# Patient Record
Sex: Female | Born: 1950 | Race: White | Hispanic: No | Smoking: Current every day smoker
Health system: Southern US, Community
[De-identification: ages and names within clinical notes are randomized; demographics above are authoritative.]

## PROBLEM LIST (undated history)

## (undated) DIAGNOSIS — I10 Essential (primary) hypertension: Secondary | ICD-10-CM

## (undated) DIAGNOSIS — I639 Cerebral infarction, unspecified: Secondary | ICD-10-CM

## (undated) DIAGNOSIS — E669 Obesity, unspecified: Secondary | ICD-10-CM

## (undated) DIAGNOSIS — E785 Hyperlipidemia, unspecified: Secondary | ICD-10-CM

## (undated) HISTORY — DX: Obesity, unspecified: E66.9

## (undated) HISTORY — PX: TUBAL LIGATION: SHX77

## (undated) HISTORY — DX: Essential (primary) hypertension: I10

## (undated) HISTORY — DX: Hyperlipidemia, unspecified: E78.5

---

## 2004-04-13 ENCOUNTER — Encounter: Admission: RE | Admit: 2004-04-13 | Discharge: 2004-07-12 | Payer: Self-pay | Admitting: Family Medicine

## 2004-07-25 ENCOUNTER — Encounter: Admission: RE | Admit: 2004-07-25 | Discharge: 2004-10-23 | Payer: Self-pay | Admitting: Family Medicine

## 2004-10-24 ENCOUNTER — Encounter: Admission: RE | Admit: 2004-10-24 | Discharge: 2005-01-13 | Payer: Self-pay | Admitting: Family Medicine

## 2005-08-05 ENCOUNTER — Encounter: Admission: RE | Admit: 2005-08-05 | Discharge: 2005-08-05 | Payer: Self-pay | Admitting: Family Medicine

## 2009-01-01 ENCOUNTER — Emergency Department (HOSPITAL_COMMUNITY): Admission: EM | Admit: 2009-01-01 | Discharge: 2009-01-01 | Payer: Self-pay | Admitting: Family Medicine

## 2010-07-31 ENCOUNTER — Encounter: Payer: Medicaid Other | Attending: Family Medicine | Admitting: *Deleted

## 2010-07-31 ENCOUNTER — Encounter: Payer: Self-pay | Admitting: *Deleted

## 2010-07-31 DIAGNOSIS — E119 Type 2 diabetes mellitus without complications: Secondary | ICD-10-CM | POA: Insufficient documentation

## 2010-07-31 DIAGNOSIS — E785 Hyperlipidemia, unspecified: Secondary | ICD-10-CM | POA: Insufficient documentation

## 2010-07-31 NOTE — Progress Notes (Signed)
  Medical Nutrition Therapy:  Appt start time:  5:00pm end time:  6:00pm.  Assessment:  Primary concerns today: T2DM  Pt with 7 yr h/o T2DM here for diet therapy. Reports previous DM education at [employer] and was doing very well, but is a "stress eater" with a "huge amount of stress lately" with daughter.  Reports fatigue, Vit D & B12 deficiencies, and wt gain of ~35 lbs in last year (d/t undesirable food choices and sedentary lifestyle).  Voices understanding of DM, CHO-counting, and dietary limitations.  No recent lab work on file and has not seen an eye doctor within the last year. Reports some blurry vision, but is unsure if just age-related or high BGs. Also reports episode of "words coming out of my mouth that I had no idea why...like apple". Pt to report episode to MD and request new lab work.   MEDICATIONS: Xanax 1 mg (prn), Paxil 10 mg daily; Reports NOT taking Vit D and Vit B12 at this time.   DIETARY INTAKE:  Usual eating pattern includes 1-2 meals and 0-1 snacks per day.  24-hr recall:  B ( AM): Bacon, egg, & cheese biscuit or SKIPS  Snk ( AM): none  L ( PM): Fast food or SKIPS Snk ( PM): none D ( PM):  Snk ( PM): none  Usual physical activity: none  Estimated energy needs: 1100 calories 135 g carbohydrates 65-70 g protein 30 g fat 25-30 g fiber  Progress Towards Goal(s):  New   Nutritional Diagnosis:  NI-5.8.2 Excessive carbohydrate intake related to diabetes as evidenced by patient-reported food history and symptoms of elevated blood glucose.    Intervention/Goals:  1100 calories/day with 30g of carbs at meals and 15g at snacks - Add protein to all meals and snacks.  Make appointment with eye doctor as soon as possible.   Aim for 25-30 grams of fiber and 64 ounces of water daily.  Limit/Avoid sugar-sweetened beverages and concentrated sweets.  Increase exercise to 2-3 times a week for 20 minutes each.  Follow up with me in 6-8 weeks or sooner if  needed.  Monitoring/Evaluation:  Dietary intake, exercise, A1c, and body weight in 4 week(s).

## 2010-07-31 NOTE — Patient Instructions (Addendum)
Goals:  1100 calories/day with 30g of carbs at meals and 15g at snacks - Add protein to all meals and snacks.  Make appointment with eye doctor as soon as possible.   Aim for 25-30 grams of fiber and 64 ounces of water daily.  Limit/Avoid sugar-sweetened beverages and concentrated sweets.  Increase exercise to 2-3 times a week for 20 minutes each.  Follow up with me in 6-8 weeks or sooner if needed.

## 2011-05-11 ENCOUNTER — Emergency Department (HOSPITAL_COMMUNITY): Payer: Medicaid Other

## 2011-05-11 ENCOUNTER — Emergency Department (HOSPITAL_COMMUNITY): Payer: Medicaid Other | Admitting: Certified Registered Nurse Anesthetist

## 2011-05-11 ENCOUNTER — Encounter (HOSPITAL_COMMUNITY): Payer: Self-pay | Admitting: *Deleted

## 2011-05-11 ENCOUNTER — Inpatient Hospital Stay (HOSPITAL_COMMUNITY)
Admission: EM | Admit: 2011-05-11 | Discharge: 2011-05-12 | DRG: 494 | Disposition: A | Discharge status: Home Health | Payer: Medicaid Other | Attending: Orthopedic Surgery | Admitting: Orthopedic Surgery

## 2011-05-11 ENCOUNTER — Encounter (HOSPITAL_COMMUNITY): Payer: Self-pay | Admitting: Certified Registered Nurse Anesthetist

## 2011-05-11 ENCOUNTER — Encounter (HOSPITAL_COMMUNITY)
Admission: EM | Disposition: A | Discharge status: Home Health | Payer: Self-pay | Source: Home / Self Care | Attending: Orthopedic Surgery

## 2011-05-11 DIAGNOSIS — E119 Type 2 diabetes mellitus without complications: Secondary | ICD-10-CM | POA: Diagnosis present

## 2011-05-11 DIAGNOSIS — F17200 Nicotine dependence, unspecified, uncomplicated: Secondary | ICD-10-CM | POA: Diagnosis present

## 2011-05-11 DIAGNOSIS — W010XXA Fall on same level from slipping, tripping and stumbling without subsequent striking against object, initial encounter: Secondary | ICD-10-CM | POA: Diagnosis present

## 2011-05-11 DIAGNOSIS — Y92009 Unspecified place in unspecified non-institutional (private) residence as the place of occurrence of the external cause: Secondary | ICD-10-CM

## 2011-05-11 DIAGNOSIS — X500XXA Overexertion from strenuous movement or load, initial encounter: Secondary | ICD-10-CM | POA: Diagnosis present

## 2011-05-11 DIAGNOSIS — I10 Essential (primary) hypertension: Secondary | ICD-10-CM | POA: Diagnosis present

## 2011-05-11 DIAGNOSIS — S82851A Displaced trimalleolar fracture of right lower leg, initial encounter for closed fracture: Secondary | ICD-10-CM

## 2011-05-11 DIAGNOSIS — E785 Hyperlipidemia, unspecified: Secondary | ICD-10-CM | POA: Diagnosis present

## 2011-05-11 DIAGNOSIS — S82853A Displaced trimalleolar fracture of unspecified lower leg, initial encounter for closed fracture: Principal | ICD-10-CM | POA: Diagnosis present

## 2011-05-11 HISTORY — PX: ORIF ANKLE FRACTURE: SHX5408

## 2011-05-11 LAB — BASIC METABOLIC PANEL
Chloride: 102 mEq/L (ref 96–112)
Creatinine, Ser: 0.7 mg/dL (ref 0.50–1.10)
GFR calc Af Amer: >90 mL/min (ref >90)
Potassium: 4.3 mEq/L (ref 3.5–5.1)
Sodium: 137 mEq/L (ref 135–145)

## 2011-05-11 LAB — GLUCOSE, CAPILLARY: Glucose-Capillary: 175 mg/dL — ABNORMAL HIGH (ref 70–99)

## 2011-05-11 LAB — DIFFERENTIAL
Basophils Absolute: 0.1 10*3/uL (ref 0.0–0.1)
Basophils Relative: 0 % (ref 0–1)
Monocytes Absolute: 0.7 10*3/uL (ref 0.1–1.0)
Neutro Abs: 10.7 10*3/uL — ABNORMAL HIGH (ref 1.7–7.7)
Neutrophils Relative %: 74 % (ref 43–77)

## 2011-05-11 LAB — CBC
MCHC: 35.6 g/dL (ref 30.0–36.0)
Platelets: 309 10*3/uL (ref 150–400)
RDW: 13.3 % (ref 11.5–15.5)

## 2011-05-11 LAB — PROTIME-INR: Prothrombin Time: 13.1 seconds (ref 11.6–15.2)

## 2011-05-11 SURGERY — OPEN REDUCTION INTERNAL FIXATION (ORIF) ANKLE FRACTURE
Anesthesia: General | Site: Ankle | Laterality: Right | Wound class: Clean

## 2011-05-11 MED ORDER — 0.9 % SODIUM CHLORIDE (POUR BTL) OPTIME
TOPICAL | Status: DC | PRN
  Administered 2011-05-11: 1000 mL

## 2011-05-11 MED ORDER — OXYCODONE-ACETAMINOPHEN 5-325 MG PO TABS: 1.0000 | ORAL_TABLET | ORAL | Status: DC | PRN

## 2011-05-11 MED ORDER — HYDROMORPHONE HCL PF 1 MG/ML IJ SOLN: 1.0000 mg | Freq: Once | INTRAMUSCULAR | Status: DC

## 2011-05-11 MED ORDER — SODIUM CHLORIDE 0.9 % IV SOLN
INTRAVENOUS | Status: DC | PRN
  Administered 2011-05-11: 17:00:00 via INTRAVENOUS

## 2011-05-11 MED ORDER — SUCCINYLCHOLINE CHLORIDE 20 MG/ML IJ SOLN
INTRAMUSCULAR | Status: DC | PRN
  Administered 2011-05-11: 120 mg via INTRAVENOUS

## 2011-05-11 MED ORDER — ONDANSETRON HCL 4 MG/2ML IJ SOLN
INTRAMUSCULAR | Status: DC | PRN
  Administered 2011-05-11: 4 mg via INTRAVENOUS

## 2011-05-11 MED ORDER — METOCLOPRAMIDE HCL 10 MG PO TABS: 5.0000 mg | ORAL_TABLET | Freq: Three times a day (TID) | ORAL | Status: DC | PRN

## 2011-05-11 MED ORDER — HYDROCODONE-ACETAMINOPHEN 5-325 MG PO TABS
1.0000 | ORAL_TABLET | ORAL | Status: DC | PRN
  Administered 2011-05-12 (×3): 1 via ORAL
  Filled 2011-05-11 (×2): qty 1

## 2011-05-11 MED ORDER — PROPOFOL 10 MG/ML IV EMUL
INTRAVENOUS | Status: DC | PRN
  Administered 2011-05-11: 170 mg via INTRAVENOUS

## 2011-05-11 MED ORDER — SODIUM CHLORIDE 0.9 % IV SOLN
INTRAVENOUS | Status: DC
  Administered 2011-05-11: 20 mL/h via INTRAVENOUS

## 2011-05-11 MED ORDER — CEFAZOLIN SODIUM 1-5 GM-% IV SOLN
INTRAVENOUS | Status: DC | PRN
  Administered 2011-05-11: 2 g via INTRAVENOUS

## 2011-05-11 MED ORDER — METHOCARBAMOL 500 MG PO TABS
500.0000 mg | ORAL_TABLET | Freq: Four times a day (QID) | ORAL | Status: DC | PRN
  Administered 2011-05-12 (×2): 500 mg via ORAL
  Filled 2011-05-11 (×2): qty 1

## 2011-05-11 MED ORDER — EPHEDRINE SULFATE 50 MG/ML IJ SOLN
INTRAMUSCULAR | Status: DC | PRN
  Administered 2011-05-11: 10 mg via INTRAVENOUS
  Administered 2011-05-11: 5 mg via INTRAVENOUS

## 2011-05-11 MED ORDER — SODIUM CHLORIDE 0.9 % IV BOLUS (SEPSIS)
1000.0000 mL | Freq: Once | INTRAVENOUS | Status: AC
  Administered 2011-05-11: 1000 mL via INTRAVENOUS

## 2011-05-11 MED ORDER — WARFARIN SODIUM 7.5 MG PO TABS
7.5000 mg | ORAL_TABLET | Freq: Once | ORAL | Status: AC
  Administered 2011-05-11: 7.5 mg via ORAL
  Filled 2011-05-11: qty 1

## 2011-05-11 MED ORDER — CEFAZOLIN SODIUM-DEXTROSE 2-3 GM-% IV SOLR: 2.0000 g | INTRAVENOUS | Status: DC

## 2011-05-11 MED ORDER — ONDANSETRON HCL 4 MG/2ML IJ SOLN: 4.0000 mg | Freq: Four times a day (QID) | INTRAMUSCULAR | Status: DC | PRN

## 2011-05-11 MED ORDER — MIDAZOLAM HCL 5 MG/5ML IJ SOLN
INTRAMUSCULAR | Status: DC | PRN
  Administered 2011-05-11: 2 mg via INTRAVENOUS

## 2011-05-11 MED ORDER — ONDANSETRON HCL 4 MG PO TABS: 4.0000 mg | ORAL_TABLET | Freq: Four times a day (QID) | ORAL | Status: DC | PRN

## 2011-05-11 MED ORDER — ALPRAZOLAM 0.5 MG PO TABS
0.5000 mg | ORAL_TABLET | Freq: Two times a day (BID) | ORAL | Status: DC | PRN
  Administered 2011-05-11 – 2011-05-12 (×2): 0.5 mg via ORAL
  Filled 2011-05-11 (×2): qty 1

## 2011-05-11 MED ORDER — CEFAZOLIN SODIUM-DEXTROSE 2-3 GM-% IV SOLR
2.0000 g | Freq: Four times a day (QID) | INTRAVENOUS | Status: AC
  Administered 2011-05-12 (×3): 2 g via INTRAVENOUS
  Filled 2011-05-11 (×3): qty 50

## 2011-05-11 MED ORDER — PAROXETINE HCL 10 MG PO TABS
10.0000 mg | ORAL_TABLET | Freq: Every day | ORAL | Status: DC
Start: 2011-05-11 — End: 2011-05-12
  Administered 2011-05-11: 10 mg via ORAL
  Filled 2011-05-11 (×2): qty 1

## 2011-05-11 MED ORDER — LIDOCAINE HCL 4 % MT SOLN
OROMUCOSAL | Status: DC | PRN
  Administered 2011-05-11: 4 mL via TOPICAL

## 2011-05-11 MED ORDER — ONDANSETRON HCL 4 MG/2ML IJ SOLN: 4.0000 mg | Freq: Once | INTRAMUSCULAR | Status: DC | PRN

## 2011-05-11 MED ORDER — METHOCARBAMOL 100 MG/ML IJ SOLN
500.0000 mg | Freq: Four times a day (QID) | INTRAVENOUS | Status: DC | PRN
  Filled 2011-05-11: qty 5

## 2011-05-11 MED ORDER — WARFARIN - PHARMACIST DOSING INPATIENT: Freq: Every day | Status: DC

## 2011-05-11 MED ORDER — HYDROMORPHONE HCL PF 1 MG/ML IJ SOLN: 0.2500 mg | INTRAMUSCULAR | Status: DC | PRN

## 2011-05-11 MED ORDER — LACTATED RINGERS IV SOLN
INTRAVENOUS | Status: DC | PRN
  Administered 2011-05-11: 17:00:00 via INTRAVENOUS

## 2011-05-11 MED ORDER — ACETAMINOPHEN 10 MG/ML IV SOLN
INTRAVENOUS | Status: DC | PRN
  Administered 2011-05-11: 1000 mg via INTRAVENOUS

## 2011-05-11 MED ORDER — METOCLOPRAMIDE HCL 5 MG/ML IJ SOLN: 5.0000 mg | Freq: Three times a day (TID) | INTRAMUSCULAR | Status: DC | PRN

## 2011-05-11 MED ORDER — FENTANYL CITRATE 0.05 MG/ML IJ SOLN
INTRAMUSCULAR | Status: DC | PRN
  Administered 2011-05-11: 100 ug via INTRAVENOUS
  Administered 2011-05-11 (×3): 50 ug via INTRAVENOUS

## 2011-05-11 MED ORDER — HYDROMORPHONE HCL PF 1 MG/ML IJ SOLN: 0.5000 mg | INTRAMUSCULAR | Status: DC | PRN

## 2011-05-11 MED ORDER — ONDANSETRON HCL 4 MG/2ML IJ SOLN: 4.0000 mg | Freq: Once | INTRAMUSCULAR | Status: DC

## 2011-05-11 MED ORDER — NICOTINE 21 MG/24HR TD PT24
21.0000 mg | MEDICATED_PATCH | Freq: Every day | TRANSDERMAL | Status: DC
  Administered 2011-05-11 – 2011-05-12 (×2): 21 mg via TRANSDERMAL
  Filled 2011-05-11 (×2): qty 1

## 2011-05-11 SURGICAL SUPPLY — 52 items
BANDAGE ESMARK 6X9 LF (GAUZE/BANDAGES/DRESSINGS) IMPLANT
BANDAGE GAUZE ELAST BULKY 4 IN (GAUZE/BANDAGES/DRESSINGS) ×1 IMPLANT
BIT DRILL 2.5X2.75 QC CALB (BIT) ×1 IMPLANT
BNDG CMPR 9X6 STRL LF SNTH (GAUZE/BANDAGES/DRESSINGS)
BNDG COHESIVE 4X5 TAN STRL (GAUZE/BANDAGES/DRESSINGS) ×2 IMPLANT
BNDG ESMARK 6X9 LF (GAUZE/BANDAGES/DRESSINGS)
BNDG GAUZE STRTCH 6 (GAUZE/BANDAGES/DRESSINGS) ×2 IMPLANT
CLOTH BEACON ORANGE TIMEOUT ST (SAFETY) ×2 IMPLANT
COVER SURGICAL LIGHT HANDLE (MISCELLANEOUS) ×3 IMPLANT
CUFF TOURNIQUET SINGLE 34IN LL (TOURNIQUET CUFF) IMPLANT
CUFF TOURNIQUET SINGLE 44IN (TOURNIQUET CUFF) IMPLANT
DRAPE C-ARM MINI 42X72 WSTRAPS (DRAPES) IMPLANT
DRAPE INCISE IOBAN 66X45 STRL (DRAPES) ×2 IMPLANT
DRAPE PROXIMA HALF (DRAPES) ×2 IMPLANT
DRAPE U-SHAPE 47X51 STRL (DRAPES) ×2 IMPLANT
DRSG ADAPTIC 3X8 NADH LF (GAUZE/BANDAGES/DRESSINGS) ×2 IMPLANT
DRSG PAD ABDOMINAL 8X10 ST (GAUZE/BANDAGES/DRESSINGS) ×1 IMPLANT
DURAPREP 26ML APPLICATOR (WOUND CARE) ×2 IMPLANT
ELECT REM PT RETURN 9FT ADLT (ELECTROSURGICAL) ×2
ELECTRODE REM PT RTRN 9FT ADLT (ELECTROSURGICAL) ×1 IMPLANT
GLOVE BIOGEL PI IND STRL 9 (GLOVE) ×1 IMPLANT
GLOVE BIOGEL PI INDICATOR 9 (GLOVE) ×1
GLOVE SURG ORTHO 9.0 STRL STRW (GLOVE) ×2 IMPLANT
GOWN PREVENTION PLUS XLARGE (GOWN DISPOSABLE) ×2 IMPLANT
GOWN SRG XL XLNG 56XLVL 4 (GOWN DISPOSABLE) ×2 IMPLANT
GOWN STRL NON-REIN XL XLG LVL4 (GOWN DISPOSABLE) ×2
K-WIRE ACE 1.6X6 (WIRE) ×2
KIT BASIN OR (CUSTOM PROCEDURE TRAY) ×2 IMPLANT
KIT ROOM TURNOVER OR (KITS) ×2 IMPLANT
KWIRE ACE 1.6X6 (WIRE) IMPLANT
MANIFOLD NEPTUNE II (INSTRUMENTS) ×2 IMPLANT
NS IRRIG 1000ML POUR BTL (IV SOLUTION) ×2 IMPLANT
PACK ORTHO EXTREMITY (CUSTOM PROCEDURE TRAY) ×2 IMPLANT
PAD ARMBOARD 7.5X6 YLW CONV (MISCELLANEOUS) ×4 IMPLANT
PADDING CAST COTTON 6X4 STRL (CAST SUPPLIES) ×2 IMPLANT
PLATE ACE 100DEG 6HOLE (Plate) ×1 IMPLANT
SCREW ACE CAN 4.0 40M (Screw) ×1 IMPLANT
SCREW LOCK CORT STAR 3.5X12 (Screw) ×1 IMPLANT
SCREW LOCK CORT STAR 3.5X14 (Screw) ×1 IMPLANT
SCREW LOCK CORT STAR 3.5X22 (Screw) ×1 IMPLANT
SCREW NLOCK CANC HEX 4X12 (Screw) ×3 IMPLANT
SCREW NLOCK CANC HEX 4X14 (Screw) ×3 IMPLANT
SPONGE GAUZE 4X4 12PLY (GAUZE/BANDAGES/DRESSINGS) ×2 IMPLANT
SPONGE LAP 18X18 X RAY DECT (DISPOSABLE) ×2 IMPLANT
STAPLER VISISTAT 35W (STAPLE) IMPLANT
SUCTION FRAZIER TIP 10 FR DISP (SUCTIONS) ×2 IMPLANT
SUT ETHILON 2 0 PSLX (SUTURE) IMPLANT
SUT VIC AB 2-0 CTB1 (SUTURE) ×4 IMPLANT
TOWEL OR 17X24 6PK STRL BLUE (TOWEL DISPOSABLE) ×2 IMPLANT
TOWEL OR 17X26 10 PK STRL BLUE (TOWEL DISPOSABLE) ×2 IMPLANT
TUBE CONNECTING 12X1/4 (SUCTIONS) ×2 IMPLANT
WATER STERILE IRR 1000ML POUR (IV SOLUTION) ×2 IMPLANT

## 2011-05-11 NOTE — Progress Notes (Signed)
Orthopedic Tech Progress Note Patient Details:  Allison Hughes 1950/05/18 [id number]  Other Ortho Devices Type of Ortho Device: CAM walker Ortho Device Location: right foot Ortho Device Interventions: [name], [name] 05/11/2011, 7:30 PM

## 2011-05-11 NOTE — H&P (Signed)
Allison Hughes is an 61 y.o. female.   Chief Complaint: Right ankle pain and deformity HPI: Patient is a 61 year old woman who states that she was feeding her dog and sustained a twisting injury to the right ankle had immediate onset of pain was unable to weight-bear.  Past Medical History  Diagnosis Date  . Diabetes mellitus 2006    T2DM  . Hyperlipidemia   . Hypertension   . Obesity (BMI 30-39.9)     Past Surgical History  Procedure Date  . Cesarean section     Family History  Problem Relation Age of Onset  . Drug abuse Daughter   . Diabetes Daughter    Social History:  reports that she has been smoking.  She has never used smokeless tobacco. She reports that she does not drink alcohol or use illicit drugs.  Allergies:  Allergies  Allergen Reactions  . Iodine Swelling  . Shrimp (Shellfish Allergy) Swelling    Fresh shrimp     (Not in a hospital admission)  Results for orders placed during the hospital encounter of 05/11/11 (from the past 48 hour(s))  CBC     Status: Abnormal   Collection Time   05/11/11  2:53 PM      Component Value Range Comment   WBC 14.4 (*) 4.0 - 10.5 (K/uL)    RBC 5.64 (*) 3.87 - 5.11 (MIL/uL)    Hemoglobin 16.3 (*) 12.0 - 15.0 (g/dL)    HCT 16.1  09.6 - 04.5 (%)    MCV 81.2  78.0 - 100.0 (fL)    MCH 28.9  26.0 - 34.0 (pg)    MCHC 35.6  30.0 - 36.0 (g/dL)    RDW 40.9  81.1 - 91.4 (%)    Platelets 309  150 - 400 (K/uL)   DIFFERENTIAL     Status: Abnormal   Collection Time   05/11/11  2:53 PM      Component Value Range Comment   Neutrophils Relative 74  43 - 77 (%)    Neutro Abs 10.7 (*) 1.7 - 7.7 (K/uL)    Lymphocytes Relative 21  12 - 46 (%)    Lymphs Abs 3.0  0.7 - 4.0 (K/uL)    Monocytes Relative 5  3 - 12 (%)    Monocytes Absolute 0.7  0.1 - 1.0 (K/uL)    Eosinophils Relative 0  0 - 5 (%)    Eosinophils Absolute 0.0  0.0 - 0.7 (K/uL)    Basophils Relative 0  0 - 1 (%)    Basophils Absolute 0.1  0.0 - 0.1 (K/uL)   BASIC METABOLIC  PANEL     Status: Abnormal   Collection Time   05/11/11  2:53 PM      Component Value Range Comment   Sodium 137  135 - 145 (mEq/L)    Potassium 4.3  3.5 - 5.1 (mEq/L)    Chloride 102  96 - 112 (mEq/L)    CO2 23  19 - 32 (mEq/L)    Glucose, Bld 104 (*) 70 - 99 (mg/dL)    BUN 9  6 - 23 (mg/dL)    Creatinine, Ser 7.82  0.50 - 1.10 (mg/dL)    Calcium 9.9  8.4 - 10.5 (mg/dL)    GFR calc non Af Amer >90  >90 (mL/min)    GFR calc Af Amer >90  >90 (mL/min)   PROTIME-INR     Status: Normal   Collection Time   05/11/11  2:53 PM  Component Value Range Comment   Prothrombin Time 13.1  11.6 - 15.2 (seconds)    INR 0.97  0.00 - 1.49     Dg Chest 2 View  05/11/2011  *RADIOLOGY REPORT*  Clinical Data: Preop radiograph  CHEST - 2 VIEW  Comparison: None  Findings: The heart size and mediastinal contours are within normal limits.  Both lungs are clear.  The visualized skeletal structures are unremarkable.  IMPRESSION: No acute cardiopulmonary abnormalities.  Original Report Authenticated By: Rosealee Albee, M.D.   Dg Ankle Complete Right  05/11/2011  *RADIOLOGY REPORT*  Clinical Data: Right ankle pain and swelling following a fall today.  RIGHT ANKLE - COMPLETE 3+ VIEW  Comparison: None.  Findings: Diffuse soft tissue swelling.  Fracture of the lateral malleolus with 1.5 shaft width of posterior displacement of the distal fragment as well as posterior angulation of the distal fragment and mild lateral displacement of the distal fragment.  There is also approximately 3.5 cm of overlapping of the fragments.  There is also a fracture through the medial malleolus with mild distraction of the fragments and mild lateral angulation of the distal fragment.  There is also a fracture through the posterior malleolus with 1 cm of overlapping of the fragments and mild posterior displacement and angulation of the posterior fragment.  Also noted is a anterior subluxation of the tibia relative to the talus.  IMPRESSION:  Trimalleolar fracture/dislocation, as described above.  Original Report Authenticated By: Darrol Angel, M.D.    Review of Systems  All other systems reviewed and are negative.    Blood pressure 145/81, pulse 93, temperature 98.2 F (36.8 C), temperature source Oral, resp. rate 20, SpO2 95.00%. Physical Exam  On examination patient does have some scratches on the skin of her ankle. There is a deformity she has a good dorsalis pedis and posterior tibial pulse. There is a moderate amount of swelling. Her foot is essentially Norvasc intact. Radiographs shows a displaced trimalleolar right ankle fracture. Assessment/Plan Assessment: Displaced right trimalleolar ankle fracture.  Plan: Discussed recommendation to proceed with open reduction internal fixation the risks of infection neurovascular injury DVT pulmonary embolus arthritis need for additional surgery were discussed patient states she initially and wished to proceed at this time. The importance of smoking cessation was discussed discussed she would have an increased risk of nonhealing of the wound nonhealing of bone increased risk of infection and DVT. Patient states she understands that she will try to quit smoking.  Allison Hughes V 05/11/2011, 3:43 PM

## 2011-05-11 NOTE — ED Provider Notes (Signed)
History     CSN: [id number]  Arrival date & time 05/11/11  1252   First MD Initiated Contact with Patient 05/11/11 1330      Chief Complaint  Patient presents with  . Ankle Pain    RT    (Consider location/radiation/quality/duration/timing/severity/associated sxs/prior treatment) HPI  61 year old obese female presents with a chief complaints of right ankle injury. Patient states she went over to her neighbor house in order to feed her dogs this a.m. patient accident in the step on some uneven ground and twisted her right ankle. She felt a pop and subsequently fell down. She denies hitting her head or loss of consciousness. She proceeds to crawl back to her house to have her daughter to bring her to the ER. Patient denies any other injuries. Patient states the pain only worsened with ankle movement, unable to ambulate. She denies numbness. She denies any presyncopal symptoms prior to fall. She is up-to-date with tetanus shot. Her last meal this morning at 9 AM.   Past Medical History  Diagnosis Date  . Diabetes mellitus 2006    T2DM  . Hyperlipidemia   . Hypertension   . Obesity (BMI 30-39.9)     Past Surgical History  Procedure Date  . Cesarean section     Family History  Problem Relation Age of Onset  . Drug abuse Daughter   . Diabetes Daughter     History  Substance Use Topics  . Smoking status: Current Everyday Smoker -- 1.0 packs/day  . Smokeless tobacco: Never Used  . Alcohol Use: No    OB History    Grav Para Term Preterm Abortions TAB SAB Ect Mult Living                  Review of Systems  All other systems reviewed and are negative.    Allergies  Iodine  Home Medications   Current Outpatient Rx  Name Route Sig Dispense Refill  . XANAX PO Oral Take by mouth as needed. 1 to 3 mg PRN     . PAROXETINE HCL 20 MG PO TABS Oral Take 20 mg by mouth every morning. Pt takes only 10 mg daily     . VITAMIN B-12 1000 MCG PO TABS Oral Take 1,000 mcg by  mouth daily.      Marland Kitchen VITAMIN D (ERGOCALCIFEROL) 50000 UNITS PO CAPS Oral Take 50,000 Units by mouth every 7 (seven) days.        BP 145/81  Pulse 93  Temp(Src) 98.2 F (36.8 C) (Oral)  Resp 20  SpO2 95%  Physical Exam  Nursing note and vitals reviewed. Constitutional: She is oriented to person, place, and time. She appears well-developed and well-nourished. No distress.  HENT:  Head: Atraumatic.  Eyes: Conjunctivae are normal.  Musculoskeletal:       Right ankle: Moderate edema noted throughout ankle. Tenderness to medial malleolus, lateral malleolus, and to posterior aspects of ankle. Pedal pulse palpable. Brisk capillary refills. No tenderness to mid foot, or to fifth MCPs.  Right knee with mild abrasion but normal range of motion, nontender.  Neurological: She is alert and oriented to person, place, and time.  Skin: Skin is warm.    ED Course  Procedures (including critical care time)  Labs Reviewed - No data to display No results found.   No diagnosis found.  No results found for this or any previous visit. Dg Ankle Complete Right  05/11/2011  *RADIOLOGY REPORT*  Clinical Data: Right ankle pain  and swelling following a fall today.  RIGHT ANKLE - COMPLETE 3+ VIEW  Comparison: None.  Findings: Diffuse soft tissue swelling.  Fracture of the lateral malleolus with 1.5 shaft width of posterior displacement of the distal fragment as well as posterior angulation of the distal fragment and mild lateral displacement of the distal fragment.  There is also approximately 3.5 cm of overlapping of the fragments.  There is also a fracture through the medial malleolus with mild distraction of the fragments and mild lateral angulation of the distal fragment.  There is also a fracture through the posterior malleolus with 1 cm of overlapping of the fragments and mild posterior displacement and angulation of the posterior fragment.  Also noted is a anterior subluxation of the tibia relative to the  talus.  IMPRESSION: Trimalleolar fracture/dislocation, as described above.  Original Report Authenticated By: Darrol Angel, M.D.   Results for orders placed during the hospital encounter of 05/11/11  CBC      Component Value Range   WBC 14.4 (*) 4.0 - 10.5 (K/uL)   RBC 5.64 (*) 3.87 - 5.11 (MIL/uL)   Hemoglobin 16.3 (*) 12.0 - 15.0 (g/dL)   HCT 16.1  09.6 - 04.5 (%)   MCV 81.2  78.0 - 100.0 (fL)   MCH 28.9  26.0 - 34.0 (pg)   MCHC 35.6  30.0 - 36.0 (g/dL)   RDW 40.9  81.1 - 91.4 (%)   Platelets 309  150 - 400 (K/uL)  DIFFERENTIAL      Component Value Range   Neutrophils Relative 74  43 - 77 (%)   Neutro Abs 10.7 (*) 1.7 - 7.7 (K/uL)   Lymphocytes Relative 21  12 - 46 (%)   Lymphs Abs 3.0  0.7 - 4.0 (K/uL)   Monocytes Relative 5  3 - 12 (%)   Monocytes Absolute 0.7  0.1 - 1.0 (K/uL)   Eosinophils Relative 0  0 - 5 (%)   Eosinophils Absolute 0.0  0.0 - 0.7 (K/uL)   Basophils Relative 0  0 - 1 (%)   Basophils Absolute 0.1  0.0 - 0.1 (K/uL)   Dg Ankle Complete Right  05/11/2011  *RADIOLOGY REPORT*  Clinical Data: Right ankle pain and swelling following a fall today.  RIGHT ANKLE - COMPLETE 3+ VIEW  Comparison: None.  Findings: Diffuse soft tissue swelling.  Fracture of the lateral malleolus with 1.5 shaft width of posterior displacement of the distal fragment as well as posterior angulation of the distal fragment and mild lateral displacement of the distal fragment.  There is also approximately 3.5 cm of overlapping of the fragments.  There is also a fracture through the medial malleolus with mild distraction of the fragments and mild lateral angulation of the distal fragment.  There is also a fracture through the posterior malleolus with 1 cm of overlapping of the fragments and mild posterior displacement and angulation of the posterior fragment.  Also noted is a anterior subluxation of the tibia relative to the talus.  IMPRESSION: Trimalleolar fracture/dislocation, as described above.   Original Report Authenticated By: Darrol Angel, M.D.       MDM  Patient has a mechanical fall and injured her right ankle. Right ankle x-ray shows a trimalleolar fracture dislocation. I discussed with my attending who will consult Dr. Lajoyce Corners from ortho for further management.  She is neurovascularly intact.    2:47 PM My attending has consulted with Dr. Lajoyce Corners, who recommend preop lab and have pt NPO.  Pt  will be seen by ortho today.  Pt is aware of plan.       Fayrene Helper, PA-C 05/11/11 1527

## 2011-05-11 NOTE — ED Notes (Signed)
PT fell this AM . Pt reports pain to RT ankle with possible defomatiy

## 2011-05-11 NOTE — ED Provider Notes (Signed)
Pt was feeding a dog, slipped on uneven ground and fell, injurying the right ankle.  She has bimallolar swelling of the right ankle, sensation and motor function intact in right foot, x-rays showing a trimalleolar fracture in good position.  Call to Dr. Aldean Baker, orthopedist on call, who came and admitted pt.  Carleene Cooper III, MD 05/11/11 2136

## 2011-05-11 NOTE — Transfer of Care (Signed)
Immediate Anesthesia Transfer of Care Note  Patient: Allison Hughes  Procedure(s) Performed: Procedure(s) (LRB): OPEN REDUCTION INTERNAL FIXATION (ORIF) ANKLE FRACTURE (Right)  Patient Location: PACU  Anesthesia Type: General  Level of Consciousness: awake, alert  and oriented  Airway & Oxygen Therapy: Patient Spontanous Breathing and Patient connected to nasal cannula oxygen  Post-op Assessment: Report given to PACU RN and Post -op Vital signs reviewed and stable  Post vital signs: Reviewed and stable  Complications: No apparent anesthesia complications

## 2011-05-11 NOTE — ED Notes (Signed)
Pt returns from xray

## 2011-05-11 NOTE — Op Note (Signed)
OPERATIVE REPORT  DATE OF SURGERY: 05/11/2011  PATIENT:  Allison Hughes,  61 y.o. female  PRE-OPERATIVE DIAGNOSIS: Trimalleolar  right ankle fracture  POST-OPERATIVE DIAGNOSIS:  same  PROCEDURE:  Procedure(s): OPEN REDUCTION INTERNAL FIXATION (ORIF) ANKLE FRACTURE  SURGEON:  Surgeon(s): Nadara Mustard, MD  ANESTHESIA:   regional and general  EBL:  Minimal ML  SPECIMEN:  No Specimen  TOURNIQUET:  * No tourniquets in log *  PROCEDURE DETAILS: Patient is a 61 year old woman who fell today with a supination external rotation injury sustaining a Weber B. trimalleolar fracture. Patient has a displaced mortise and presents at this time for open reduction internal fixation. Risks and benefits were discussed including infection neurovascular injury persistent pain arthritis DVT pulmonary embolus need for additional surgery. Patient states she understands and wished to proceed at this time. Discussed increased risk if she continues to smoke. Description of procedure patient brought to or room 5 after undergoing a popliteal block she underwent a general anesthetic. After adequate levels and anesthesia were obtained patient's right lower extremity was prepped using DuraPrep draped in the sterile field an Ioban was used to cover all exposed skin. A lateral incision was made over the fibula this was carried sharply down to bone the fracture edges were freshened the fracture was reduced and an anti-glide one third 6 hole tubular plate was applied interfrag screw was placed across the fracture site compression screws were placed proximally and distally. C-arm fluoroscopy verified reduction of the fibula. The wound is irrigated and 0 Vicryl was used to close the subcutaneous tissue. Attention was then focused on the medial malleolus. The skin was incised the fracture was reduced stabilized with a K wire and then a cannulated 40 mm partially-threaded screw was inserted over the K wire. The wire was removed the  mortise congruence was verified with C-arm fluoroscopy. The posterior malleolar fracture reduced. The wound was irrigated subcutaneous is closed using 0 Vicryl the skin was closed on both incisions with staples. The wounds were covered with Adaptic orthopedic sponges AB dressing Kerlix and Coban. Patient was extubated taken to the PACU in stable condition.  PLAN OF CARE: Admit for overnight observation  PATIENT DISPOSITION:  PACU - hemodynamically stable.   Nadara Mustard, MD 05/11/2011 6:00 PM

## 2011-05-11 NOTE — Anesthesia Procedure Notes (Addendum)
Procedure Name: Intubation Date/Time: 05/11/2011 5:12 PM Performed by: Julianne Rice Z Pre-anesthesia Checklist: Patient identified, Timeout performed, Emergency Drugs available, Suction available and Patient being monitored Patient Re-evaluated:Patient Re-evaluated prior to inductionOxygen Delivery Method: Circle system utilized Preoxygenation: Pre-oxygenation with 100% oxygen Intubation Type: IV induction, Rapid sequence and Cricoid Pressure applied Laryngoscope Size: Mac and 3 Grade View: Grade I Tube type: Oral Tube size: 7.5 mm Number of attempts: 1 Airway Equipment and Method: Stylet and LTA kit utilized Placement Confirmation: ETT inserted through vocal cords under direct vision,  breath sounds checked- equal and bilateral and positive ETCO2 Secured at: 21 cm Tube secured with: Tape Dental Injury: Teeth and Oropharynx as per pre-operative assessment    Anesthesia Regional Block:  Popliteal block  Pre-Anesthetic Checklist: ,, timeout performed, Correct Patient, Correct Site, Correct Laterality, Correct Procedure, Correct Position, site marked, Risks and benefits discussed,  Surgical consent,  Pre-op evaluation,  At surgeon's request and post-op pain management  Laterality: Right  Prep: chloraprep       Needles:  Injection technique: Single-shot  Needle Type: Echogenic Stimulator Needle     Needle Length:cm 9 cm Needle Gauge: 22 and 22 G    Additional Needles:  Procedures: ultrasound guided and nerve stimulator Popliteal block Narrative:  Start time: 05/11/2011 4:45 PM End time: 05/11/2011 4:55 PM  Performed by: Personally   Additional Notes: 30 cc 0.5% Marcaine 1:200 Epi injected under US guidance around popliteal nerves at bifurcation.  15 cc 0.5% naropin injected in subQ wheal along medial tibial below the knee for saphenous block.  Popliteal block

## 2011-05-11 NOTE — Anesthesia Postprocedure Evaluation (Signed)
  Anesthesia Post-op Note  Patient: Allison Hughes  Procedure(s) Performed: Procedure(s) (LRB): OPEN REDUCTION INTERNAL FIXATION (ORIF) ANKLE FRACTURE (Right)  Patient Location: PACU  Anesthesia Type: General and GA combined with regional for post-op pain  Level of Consciousness: awake, alert  and oriented  Airway and Oxygen Therapy: Patient connected to nasal cannula oxygen  Post-op Pain: mild  Post-op Assessment: Post-op Vital signs reviewed and Patient's Cardiovascular Status Stable  Post-op Vital Signs: stable  Complications: No apparent anesthesia complications

## 2011-05-11 NOTE — ED Notes (Signed)
Security at bed side to collect purse and contents

## 2011-05-11 NOTE — Preoperative (Signed)
Beta Blockers   Reason not to administer Beta Blockers:Not Applicable 

## 2011-05-11 NOTE — Progress Notes (Signed)
ANTICOAGULATION CONSULT NOTE - Initial Consult  Pharmacy Consult for Coumadin Indication: VTE prophylaxis  Allergies  Allergen Reactions  . Iodine Swelling  . Shrimp (Shellfish Allergy) Swelling    Fresh shrimp    Vital Signs: Temp: 97.7 F (36.5 C) (04/27 1923) Temp src: Oral (04/27 1923) BP: 115/64 mmHg (04/27 1923) Pulse Rate: 85  (04/27 1923)  Labs:  Basename 05/11/11 1453  HGB 16.3*  HCT 45.8  PLT 309  APTT --  LABPROT 13.1  INR 0.97  HEPARINUNFRC --  CREATININE 0.70  CKTOTAL --  CKMB --  TROPONINI --   The CrCl is unknown because both a height and weight (above a minimum accepted value) are required for this calculation.  Medical History: Past Medical History  Diagnosis Date  . Diabetes mellitus 2006    T2DM  . Hyperlipidemia   . Hypertension   . Obesity (BMI 30-39.9)     Medications:  Prescriptions prior to admission  Medication Sig Dispense Refill  . ALPRAZolam (XANAX) 1 MG tablet Take 0.5-1 mg by mouth 2 (two) times daily as needed. For anxiety and sleep.      Marland Kitchen ibuprofen (ADVIL,MOTRIN) 200 MG tablet Take 200 mg by mouth daily as needed. For back pain.      Marland Kitchen PARoxetine (PAXIL) 20 MG tablet Take 10 mg by mouth every evening.       . vitamin B-12 (CYANOCOBALAMIN) 1000 MCG tablet Take 1,000 mcg by mouth daily.        . Vitamin D, Ergocalciferol, (DRISDOL) 50000 UNITS CAPS Take 50,000 Units by mouth every 7 (seven) days.         Scheduled:    .  ceFAZolin (ANCEF) IV  2 g Intravenous Q6H  . PARoxetine  10 mg Oral QHS  . sodium chloride  1,000 mL Intravenous Once  . DISCONTD:  ceFAZolin (ANCEF) IV  2 g Intravenous 60 min Pre-Op  . DISCONTD:  HYDROmorphone (DILAUDID) injection  1 mg Intravenous Once  . DISCONTD: ondansetron  4 mg Intravenous Once    Assessment: 54 YOF with displaced right ankle fracture, s/p ORIF to start coumadin for VTE px. Pt wasn't on any anticoagulants prior to admission, baseline INR 0.97, hgb, plt wnl  Goal of Therapy:    INR 2-3   Plan:  - Coumadin 7.5mg  po x 1 - F/u daily INR  Bayard Hugger, PharmD, BCPS  Clinical Pharmacist  Pager: [phone]

## 2011-05-11 NOTE — Anesthesia Preprocedure Evaluation (Addendum)
Anesthesia Evaluation  Patient identified by MRN, date of birth, ID band Patient awake    Reviewed: Allergy & Precautions, H&P , NPO status , Patient's Chart, lab work & pertinent test results  Airway Mallampati: I TM Distance: >3 FB     Dental  (+) Upper Dentures and Partial Lower   Pulmonary Current Smoker,          Cardiovascular hypertension,  Doesn't take meds "even though I should".   Neuro/Psych    GI/Hepatic   Endo/Other  Diabetes mellitus-Diet controlled  Renal/GU      Musculoskeletal   Abdominal   Peds  Hematology   Anesthesia Other Findings   Reproductive/Obstetrics                         Anesthesia Physical Anesthesia Plan  ASA: III  Anesthesia Plan:    Post-op Pain Management:    Induction:   Airway Management Planned:   Additional Equipment:   Intra-op Plan:   Post-operative Plan:   Informed Consent:   Plan Discussed with:   Anesthesia Plan Comments:         Anesthesia Quick Evaluation

## 2011-05-12 ENCOUNTER — Encounter (HOSPITAL_COMMUNITY): Payer: Self-pay | Admitting: *Deleted

## 2011-05-12 LAB — GLUCOSE, CAPILLARY: Glucose-Capillary: 129 mg/dL — ABNORMAL HIGH (ref 70–99)

## 2011-05-12 LAB — PROTIME-INR
INR: 1.03 (ref 0.00–1.49)
Prothrombin Time: 13.7 seconds (ref 11.6–15.2)

## 2011-05-12 MED ORDER — OXYCODONE-ACETAMINOPHEN 5-325 MG PO TABS: 1.0000 | ORAL_TABLET | ORAL | Status: AC | PRN

## 2011-05-12 NOTE — Discharge Instructions (Signed)
Wear fracture boot for ambulation. Strict nonweightbearing right lower extremity. Keep right foot elevated above the heart. Strict non-smoking.

## 2011-05-12 NOTE — Progress Notes (Signed)
   CARE MANAGEMENT NOTE 05/12/2011  Patient:  Allison Hughes, Allison Hughes   Account Number:  [id number]  Date Initiated:  05/12/2011  Documentation initiated by:  [facility]  Subjective/Objective Assessment:   OPEN REDUCTION INTERNAL FIXATION (ORIF) ANKLE FRACTURE (Right     Action/Plan:   Anticipated DC Date:  05/12/2011   Anticipated DC Plan:  HOME W HOME HEALTH SERVICES      DC Planning Services  CM consult      [facility] Choice  HOME HEALTH   Choice offered to / List presented to:  C-4 Adult Children   DME arranged  [facility] - Lavone Nian      DME agency  [facility]     HH arranged  HH-2 PT      [facility] agency  [facility]   Status of service:  Completed, signed off Medicare Important Message given?   (If response is "NO", the following Medicare IM given date fields will be blank) Date Medicare IM given:   Date Additional Medicare IM given:    Discharge Disposition:  HOME W HOME HEALTH SERVICES  Per UR Regulation:    If discussed at Long Length of Stay Meetings, dates discussed:    Comments:  05/12/2011 1700 Spoke to pt's daughter and pt requested [facility]. Contacted [facility] for RW and [facility] PT for scheduled d/c today. Isidoro Donning RN CCM Case Mgmt phone [phone]

## 2011-05-12 NOTE — Discharge Summary (Signed)
Physician Discharge Summary  Patient ID: Allison Hughes MRN: [id number] DOB/AGE: 04-12-1950 61 y.o.  Admit date: 05/11/2011 Discharge date: 05/12/2011  Admission Diagnoses: trimalleolar right ankle fracture  Discharge Diagnoses: Same Active Problems:  * No active hospital problems. *    Discharged Condition: stable  Hospital Course: Patient's hospital course was essentially unremarkable. She underwent open reduction internal fixation of the medial and lateral malleolar line. Postoperatively she progressed well was able to ambulate independently she was discharged to home in stable condition  Consults: None  Significant Diagnostic Studies: labs: Please see routine labs  Treatments: surgery: Please see operative note  Discharge Exam: Blood pressure 102/65, pulse 76, temperature 97.5 F (36.4 C), temperature source Oral, resp. rate 18, SpO2 96.00%. Incision/Wound: clean and dry  Disposition: Final discharge disposition not confirmed  Discharge Orders    Future Orders Please Complete By Expires   Diet - low sodium heart healthy      Call MD / Call 911      Comments:   If you experience chest pain or shortness of breath, CALL 911 and be transported to the hospital emergency room.  If you develope a fever above 101 F, pus (white drainage) or increased drainage or redness at the wound, or calf pain, call your surgeon's office.   Constipation Prevention      Comments:   Drink plenty of fluids.  Prune juice may be helpful.  You may use a stool softener, such as Colace (over the counter) 100 mg twice a day.  Use MiraLax (over the counter) for constipation as needed.   Increase activity slowly as tolerated      Weight Bearing as taught in Physical Therapy      Comments:   Use a walker or crutches as instructed.     Medication List  As of 05/12/2011  7:54 AM   TAKE these medications         ALPRAZolam 1 MG tablet   Commonly known as: XANAX   Take 0.5-1 mg by mouth 2 (two) times  daily as needed. For anxiety and sleep.      ibuprofen 200 MG tablet   Commonly known as: ADVIL,MOTRIN   Take 200 mg by mouth daily as needed. For back pain.      oxyCODONE-acetaminophen 5-325 MG per tablet   Commonly known as: PERCOCET   Take 1 tablet by mouth every 4 (four) hours as needed for pain.      PARoxetine 20 MG tablet   Commonly known as: PAXIL   Take 10 mg by mouth every evening.      vitamin B-12 1000 MCG tablet   Commonly known as: CYANOCOBALAMIN   Take 1,000 mcg by mouth daily.      Vitamin D (Ergocalciferol) 50000 UNITS Caps   Commonly known as: DRISDOL   Take 50,000 Units by mouth every 7 (seven) days.           Follow-up Information    Follow up with Nadara Mustard, MD.   Contact information:   [address] [place] [address] [phone]          Signed: Nadara Mustard 05/12/2011, 7:54 AM

## 2011-05-12 NOTE — Progress Notes (Signed)
05/12/11  11:28 PT Note: Pt currently eating lunch and requests to hold PT until afterwards.  Will follow-up with pt later this pm.  05/12/2011 Cephus Shelling, PT, DPT [phone]

## 2011-05-12 NOTE — Progress Notes (Signed)
Patient discharge instructions reviewed with patient. Patient verbalized understanding.

## 2011-05-12 NOTE — Evaluation (Signed)
Physical Therapy Evaluation Patient Details Name: Allison Hughes MRN: [id number] DOB: 02-15-1950 Today's Date: 05/12/2011  PT Assessment / Plan / Recommendation Clinical Impression  Pt presents with a medical diagnosis of ankle fx s/p ORIF. Plan to d/c home today. Pt and daughter educated on proper technique and safety with RW and stairs. Pt is safe to d/c home with close supervision 24/7 as well as decreased mobility once at home. HHPT ordered. No acute PT needs as pt is discharging this evening.    PT Assessment  All further PT needs can be met in the next venue of care    Follow Up Recommendations  Home health PT;Supervision/Assistance - 24 hour    Equipment Recommendations  Rolling walker with 5" wheels       Precautions / Restrictions Precautions Precautions: Fall Restrictions Weight Bearing Restrictions: Yes RLE Weight Bearing: Weight bearing as tolerated         Mobility  Bed Mobility Bed Mobility: Supine to Sit;Sitting - Scoot to Edge of Bed Supine to Sit: 5: Supervision Sitting - Scoot to Edge of Bed: 5: Supervision Details for Bed Mobility Assistance: VC for sequencing. No physical assist needed Transfers Transfers: Sit to Stand;Stand to Sit Sit to Stand: 5: Supervision;With upper extremity assist;From bed;From toilet;From chair/3-in-1 Stand to Sit: 5: Supervision;To toilet;To bed;To chair/3-in-1 Details for Transfer Assistance: VC for hand placement for safety Ambulation/Gait Ambulation/Gait Assistance: 4: Min guard Ambulation Distance (Feet): 20 Feet Assistive device: Rolling walker Ambulation/Gait Assistance Details: VC for sequencing and safety with rolling walker. Min guard for safety and supervision. Daughter educated on proper way to guard for safety Gait Pattern: Step-to pattern;Decreased stance time - right;Decreased step length - left Gait velocity: decreased gait speed General Gait Details: WBAT with cam boot on R Stairs: Yes Stairs Assistance: 4:  Min guard Stair Management Technique: Two rails;Forwards Number of Stairs: 2       Visit Information  Last PT Received On: 05/12/11    Subjective Data  Patient Stated Goal: to be able to walk on the foot   Prior Functioning  Home Living Lives With: Daughter Available Help at Discharge: Family;Available 24 hours/day Type of Home: House Home Access: Stairs to enter [employer] of Steps: 4 Entrance Stairs-Rails: Can reach both Home Layout: One level Bathroom Shower/Tub: Forensic scientist: Standard Bathroom Accessibility: Yes How Accessible: Accessible via walker Home Adaptive Equipment: Straight cane Prior Function Level of Independence: Independent Able to Take Stairs?: Yes Driving: Yes Vocation: Retired Musician: No difficulties    Cognition  Overall Cognitive Status: Appears within functional limits for tasks assessed/performed Arousal/Alertness: Awake/alert Orientation Level: Oriented X4 / Intact Behavior During Session: WFL for tasks performed    Extremity/Trunk Assessment Right Lower Extremity Assessment RLE ROM/Strength/Tone: Within functional levels RLE Sensation: WFL - Light Touch Left Lower Extremity Assessment LLE ROM/Strength/Tone: Unable to fully assess;Due to pain (knee and hip WFL; ankle in bandaging) LLE Sensation: Deficits LLE Sensation Deficits: see above, unable to assess due to bandaging      End of Session PT - End of Session Equipment Utilized During Treatment: Gait belt;Other (comment) (CAM boot) Activity Tolerance: Patient tolerated treatment well Patient left: in chair;with call bell/phone within reach;with family/visitor present Nurse Communication: Mobility status   Milana Kidney 05/12/2011, 5:44 PM  05/12/2011 Milana Kidney DPT PAGER: [phone] OFFICE: [phone]

## 2011-05-13 ENCOUNTER — Encounter (HOSPITAL_COMMUNITY): Payer: Self-pay | Admitting: Orthopedic Surgery

## 2016-02-01 ENCOUNTER — Encounter (HOSPITAL_COMMUNITY): Payer: Self-pay | Admitting: Emergency Medicine

## 2016-02-01 ENCOUNTER — Inpatient Hospital Stay (HOSPITAL_COMMUNITY)
Admission: EM | Admit: 2016-02-01 | Discharge: 2016-02-05 | DRG: 872 | Disposition: A | Discharge status: Home / Self Care | Payer: Medicaid Other | Attending: Internal Medicine | Admitting: Internal Medicine

## 2016-02-01 ENCOUNTER — Emergency Department (HOSPITAL_COMMUNITY): Payer: Medicaid Other

## 2016-02-01 DIAGNOSIS — Z6832 Body mass index (BMI) 32.0-32.9, adult: Secondary | ICD-10-CM

## 2016-02-01 DIAGNOSIS — I10 Essential (primary) hypertension: Secondary | ICD-10-CM | POA: Diagnosis present

## 2016-02-01 DIAGNOSIS — E876 Hypokalemia: Secondary | ICD-10-CM | POA: Diagnosis present

## 2016-02-01 DIAGNOSIS — E785 Hyperlipidemia, unspecified: Secondary | ICD-10-CM | POA: Diagnosis present

## 2016-02-01 DIAGNOSIS — Z91013 Allergy to seafood: Secondary | ICD-10-CM

## 2016-02-01 DIAGNOSIS — E119 Type 2 diabetes mellitus without complications: Secondary | ICD-10-CM | POA: Diagnosis present

## 2016-02-01 DIAGNOSIS — Z833 Family history of diabetes mellitus: Secondary | ICD-10-CM

## 2016-02-01 DIAGNOSIS — N10 Acute tubulo-interstitial nephritis: Secondary | ICD-10-CM | POA: Diagnosis present

## 2016-02-01 DIAGNOSIS — B9620 Unspecified Escherichia coli [E. coli] as the cause of diseases classified elsewhere: Secondary | ICD-10-CM | POA: Diagnosis present

## 2016-02-01 DIAGNOSIS — F17210 Nicotine dependence, cigarettes, uncomplicated: Secondary | ICD-10-CM | POA: Diagnosis present

## 2016-02-01 DIAGNOSIS — F419 Anxiety disorder, unspecified: Secondary | ICD-10-CM | POA: Diagnosis present

## 2016-02-01 DIAGNOSIS — A419 Sepsis, unspecified organism: Principal | ICD-10-CM | POA: Diagnosis present

## 2016-02-01 DIAGNOSIS — Z91048 Other nonmedicinal substance allergy status: Secondary | ICD-10-CM

## 2016-02-01 LAB — CBC WITH DIFFERENTIAL/PLATELET
Basophils Absolute: 0 10*3/uL (ref 0.0–0.1)
Basophils Relative: 0 %
EOS ABS: 0.1 10*3/uL (ref 0.0–0.7)
EOS PCT: 1 %
HCT: 42.3 % (ref 36.0–46.0)
Hemoglobin: 14.2 g/dL (ref 12.0–15.0)
LYMPHS PCT: 20 %
Lymphs Abs: 2.2 10*3/uL (ref 0.7–4.0)
MCH: 27.4 pg (ref 26.0–34.0)
MCHC: 33.6 g/dL (ref 30.0–36.0)
MCV: 81.7 fL (ref 78.0–100.0)
MONO ABS: 0.8 10*3/uL (ref 0.1–1.0)
Monocytes Relative: 7 %
Neutro Abs: 7.6 10*3/uL (ref 1.7–7.7)
Neutrophils Relative %: 72 %
PLATELETS: 292 10*3/uL (ref 150–400)
RBC: 5.18 MIL/uL — ABNORMAL HIGH (ref 3.87–5.11)
RDW: 13.6 % (ref 11.5–15.5)
WBC: 10.6 10*3/uL — ABNORMAL HIGH (ref 4.0–10.5)

## 2016-02-01 LAB — I-STAT CG4 LACTIC ACID, ED: LACTIC ACID, VENOUS: 2.98 mmol/L — AB (ref 0.5–1.9)

## 2016-02-01 LAB — URINALYSIS, MICROSCOPIC (REFLEX)

## 2016-02-01 LAB — URINALYSIS, ROUTINE W REFLEX MICROSCOPIC
GLUCOSE, UA: NEGATIVE mg/dL
Ketones, ur: NEGATIVE mg/dL
NITRITE: POSITIVE — AB
Protein, ur: 100 mg/dL — AB
Specific Gravity, Urine: >1.03 — ABNORMAL HIGH (ref 1.005–1.030)
pH: 6 (ref 5.0–8.0)

## 2016-02-01 LAB — COMPREHENSIVE METABOLIC PANEL
ALBUMIN: 2.9 g/dL — AB (ref 3.5–5.0)
ALT: 27 U/L (ref 14–54)
AST: 27 U/L (ref 15–41)
Alkaline Phosphatase: 111 U/L (ref 38–126)
Anion gap: 12 (ref 5–15)
BUN: 6 mg/dL (ref 6–20)
CHLORIDE: 101 mmol/L (ref 101–111)
CO2: 23 mmol/L (ref 22–32)
CREATININE: 0.81 mg/dL (ref 0.44–1.00)
Calcium: 9.1 mg/dL (ref 8.9–10.3)
GFR calc Af Amer: >60 mL/min (ref >60)
GFR calc non Af Amer: >60 mL/min (ref >60)
GLUCOSE: 218 mg/dL — AB (ref 65–99)
Potassium: 3 mmol/L — ABNORMAL LOW (ref 3.5–5.1)
Sodium: 136 mmol/L (ref 135–145)
Total Bilirubin: 0.6 mg/dL (ref 0.3–1.2)
Total Protein: 6.6 g/dL (ref 6.5–8.1)

## 2016-02-01 MED ORDER — SODIUM CHLORIDE 0.9 % IV BOLUS (SEPSIS)
1000.0000 mL | Freq: Once | INTRAVENOUS | Status: AC
  Administered 2016-02-01: 1000 mL via INTRAVENOUS

## 2016-02-01 MED ORDER — SODIUM CHLORIDE 0.9 % IV SOLN
30.0000 meq | Freq: Once | INTRAVENOUS | Status: AC
  Administered 2016-02-02: 30 meq via INTRAVENOUS
  Filled 2016-02-01: qty 15

## 2016-02-01 NOTE — ED Notes (Signed)
One set of blood cultures at bedside, if ordered.

## 2016-02-01 NOTE — ED Provider Notes (Signed)
Care assumed from Dr. Rubin PayorPickering.  Allison MattesKathryn Hughes is a 66 y.o. female presents with generalized weakness, urinary incontinence, urinary frequency and dysuria x 1 month.  Febrile and tachycardic on arrival.  No abd pain, no flank pain, no vomiting.     Physical Exam  BP 144/83   Pulse 89   Temp 99.3 F (37.4 C) (Oral)   Resp (!) 34   Ht 5\' 3"  (1.6 m)   Wt 82.6 kg   SpO2 93%   BMI 32.24 kg/m   Physical Exam  Constitutional: She appears well-developed and well-nourished. No distress.  HENT:  Head: Normocephalic.  Eyes: Conjunctivae are normal. No scleral icterus.  Neck: Normal range of motion.  Cardiovascular: Normal rate and intact distal pulses.   Pulmonary/Chest: Effort normal.  Musculoskeletal: Normal range of motion.  Neurological: She is alert.  Skin: Skin is warm and dry.    ED Course  Procedures  Clinical Course as of Feb 02 24  Thu Feb 01, 2016  2348 Plan: UA pending.  If positive, IV abx and admit for UTI.  She has received 2L of fluid.    [HM]  Fri Feb 02, 2016  0023 Discussed with Dr. Maryfrances Bunnellanford who will admit.    [HM]  0024 UA with positive nitrites and leukocytes.   Leukocytes, UA: (!) MODERATE [HM]  0024 Tachycardia resolved Pulse Rate: 89 [HM]    Clinical Course User Index [HM] Dahlia ClientHannah Joss Friedel, PA-C    MDM Urinalysis with evidence of UTI. Elevated lactic acid 2.98. Patient has been given fluids. Urine and blood cultures ordered along with antibiotics. Patient will be admitted.       Dahlia ClientHannah Dezmin Kittelson, PA-C 02/02/16 [id number]    Benjiman CoreNathan Pickering, MD 02/02/16 2322

## 2016-02-01 NOTE — ED Triage Notes (Signed)
Per EMS: pt from home c/o multiple falls with generalized weakness and balance issues; pt sts some incontinence and urinary frequency recently

## 2016-02-01 NOTE — ED Provider Notes (Signed)
[facility]-EMERGENCY DEPT Provider Note   CSN: [id number] Arrival date & time: 02/01/16  1818     History   Chief Complaint Chief Complaint  Patient presents with  . Dysuria    HPI Allison Hughes is a 66 y.o. female.  HPI patient presents with dysuria and urinary incontinence. Been having for the last couple months. States she's going more frequently it burns and smells funny. No real abdominal pain. States she is really here today because she was so weak she is having difficulty walking. She's had falls and unsteadiness. States she feels weak all over. No headache. Has had fevers at home and has a fever here. No confusion. No flank pain.  Past Medical History:  Diagnosis Date  . Diabetes mellitus 2006   T2DM  . Hyperlipidemia   . Hypertension   . Obesity (BMI 30-39.9)     There are no active problems to display for this patient.   Past Surgical History:  Procedure Laterality Date  . CESAREAN SECTION    . ORIF ANKLE FRACTURE  05/11/2011   Procedure: OPEN REDUCTION INTERNAL FIXATION (ORIF) ANKLE FRACTURE;  Surgeon: Nadara Mustard, MD;  Location: [facility] OR;  Service: Orthopedics;  Laterality: Right;  . TUBAL LIGATION      OB History    No data available       Home Medications    Prior to Admission medications   Medication Sig Start Date End Date Taking? Authorizing Provider  ALPRAZolam Prudy Feeler) 1 MG tablet Take 0.5-1 mg by mouth 2 (two) times daily as needed. For anxiety and sleep.   Yes Historical Provider, MD  ibuprofen (ADVIL,MOTRIN) 200 MG tablet Take 200 mg by mouth daily as needed. For back pain.   Yes Historical Provider, MD  lisinopril (PRINIVIL,ZESTRIL) 20 MG tablet Take 20 mg by mouth daily.   Yes Historical Provider, MD  PARoxetine (PAXIL) 20 MG tablet Take 10 mg by mouth daily.    Yes Historical Provider, MD  vitamin B-12 (CYANOCOBALAMIN) 1000 MCG tablet Take 1,000 mcg by mouth daily.     Yes Historical Provider, MD  Vitamin D, Ergocalciferol, (DRISDOL) 50000 UNITS  CAPS Take 50,000 Units by mouth every 7 (seven) days.     Yes Historical Provider, MD    Family History Family History  Problem Relation Age of Onset  . Drug abuse Daughter   . Diabetes Daughter     Social History Social History  Substance Use Topics  . Smoking status: Current Every Day Smoker    Packs/day: 1.00    Years: 40.00    Types: Cigarettes  . Smokeless tobacco: Former Neurosurgeon    Quit date: 05/11/2011  . Alcohol use No     Allergies   Iodine and Shrimp [shellfish allergy]   Review of Systems Review of Systems  Constitutional: Positive for appetite change, fatigue and fever.  HENT: Negative for congestion.   Respiratory: Negative for shortness of breath.   Gastrointestinal: Positive for abdominal pain.  Genitourinary: Positive for dysuria.  Musculoskeletal: Negative for back pain.  Skin: Negative for wound.  Neurological: Positive for weakness.     Physical Exam Updated Vital Signs BP 144/83   Pulse 89   Temp 99.3 F (37.4 C) (Oral)   Resp (!) 34   Ht 5\' 3"  (1.6 m)   Wt 182 lb (82.6 kg)   SpO2 93%   BMI 32.24 kg/m   Physical Exam  Constitutional: She appears well-developed.  HENT:  Head: Atraumatic.  Eyes: EOM are normal.  Neck: Normal range of motion.  Cardiovascular: Normal rate.   Pulmonary/Chest: Effort normal.  Abdominal: There is tenderness.  Mild suprapubic tenderness. No rebound or guarding. No mass.  Musculoskeletal: She exhibits no edema.  Neurological: She is alert.  Good grips bilaterally. Good active straight leg raise bilaterally. Awake and appropriate  Skin: Skin is warm. Capillary refill takes less than 2 seconds.     ED Treatments / Results  Labs (all labs ordered are listed, but only abnormal results are displayed) Labs Reviewed  COMPREHENSIVE METABOLIC PANEL - Abnormal; Notable for the following:       Result Value   Potassium 3.0 (*)    Glucose, Bld 218 (*)    Albumin 2.9 (*)    All other components within normal  limits  CBC WITH DIFFERENTIAL/PLATELET - Abnormal; Notable for the following:    WBC 10.6 (*)    RBC 5.18 (*)    All other components within normal limits  I-STAT CG4 LACTIC ACID, ED - Abnormal; Notable for the following:    Lactic Acid, Venous 2.98 (*)    All other components within normal limits  URINALYSIS, ROUTINE W REFLEX MICROSCOPIC    EKG  EKG Interpretation None       Radiology Dg Chest 2 View  Result Date: 02/01/2016 CLINICAL DATA:  Fever, multiple falls EXAM: CHEST  2 VIEW COMPARISON:  05/11/2011 FINDINGS: The heart size and mediastinal contours are within normal limits. Both lungs are clear. Mild degenerative changes of the spine. IMPRESSION: No active cardiopulmonary disease. Electronically Signed   By: Jasmine PangKim  Fujinaga M.D.   On: 02/01/2016 21:58    Procedures Procedures (including critical care time)  Medications Ordered in ED Medications  sodium chloride 0.9 % bolus 1,000 mL (1,000 mLs Intravenous New Bag/Given 02/01/16 2334)  sodium chloride 0.9 % bolus 1,000 mL (0 mLs Intravenous Stopped 02/01/16 2300)     Initial Impression / Assessment and Plan / ED Course  I have reviewed the triage vital signs and the nursing notes.  Pertinent labs & imaging results that were available during my care of the patient were reviewed by me and considered in my medical decision making (see chart for details).     Patient presents with dysuria and fever. Unsteadiness and weakness with it. Has been going for the last month. Worsening weakness. I think is likely urinary tract infection but urine is pending at this time. Lactic acid mildly elevated. IV fluids given. Likely admission to hospital. Care turned over to Muthersbaugh PA.   Final Clinical Impressions(s) / ED Diagnoses   Final diagnoses:  Hypokalemia    New Prescriptions New Prescriptions   No medications on file     Benjiman CoreNathan Kaya Pottenger, MD 02/01/16 2349

## 2016-02-01 NOTE — ED Notes (Signed)
Patient transported to X-ray 

## 2016-02-02 ENCOUNTER — Encounter (HOSPITAL_COMMUNITY): Payer: Self-pay | Admitting: Family Medicine

## 2016-02-02 DIAGNOSIS — N10 Acute tubulo-interstitial nephritis: Secondary | ICD-10-CM

## 2016-02-02 DIAGNOSIS — F419 Anxiety disorder, unspecified: Secondary | ICD-10-CM | POA: Diagnosis present

## 2016-02-02 DIAGNOSIS — Z6832 Body mass index (BMI) 32.0-32.9, adult: Secondary | ICD-10-CM | POA: Diagnosis not present

## 2016-02-02 DIAGNOSIS — E785 Hyperlipidemia, unspecified: Secondary | ICD-10-CM | POA: Diagnosis present

## 2016-02-02 DIAGNOSIS — F17210 Nicotine dependence, cigarettes, uncomplicated: Secondary | ICD-10-CM | POA: Diagnosis present

## 2016-02-02 DIAGNOSIS — Z91048 Other nonmedicinal substance allergy status: Secondary | ICD-10-CM | POA: Diagnosis not present

## 2016-02-02 DIAGNOSIS — A419 Sepsis, unspecified organism: Secondary | ICD-10-CM | POA: Diagnosis present

## 2016-02-02 DIAGNOSIS — I10 Essential (primary) hypertension: Secondary | ICD-10-CM | POA: Diagnosis present

## 2016-02-02 DIAGNOSIS — Z833 Family history of diabetes mellitus: Secondary | ICD-10-CM | POA: Diagnosis not present

## 2016-02-02 DIAGNOSIS — E876 Hypokalemia: Secondary | ICD-10-CM | POA: Diagnosis present

## 2016-02-02 DIAGNOSIS — B9620 Unspecified Escherichia coli [E. coli] as the cause of diseases classified elsewhere: Secondary | ICD-10-CM | POA: Diagnosis present

## 2016-02-02 DIAGNOSIS — Z91013 Allergy to seafood: Secondary | ICD-10-CM | POA: Diagnosis not present

## 2016-02-02 DIAGNOSIS — E119 Type 2 diabetes mellitus without complications: Secondary | ICD-10-CM | POA: Diagnosis present

## 2016-02-02 HISTORY — DX: Acute pyelonephritis: N10

## 2016-02-02 HISTORY — DX: Sepsis, unspecified organism: A41.9

## 2016-02-02 LAB — BASIC METABOLIC PANEL
ANION GAP: 10 (ref 5–15)
BUN: <5 mg/dL — ABNORMAL LOW (ref 6–20)
CALCIUM: 7.7 mg/dL — AB (ref 8.9–10.3)
CO2: 24 mmol/L (ref 22–32)
Chloride: 105 mmol/L (ref 101–111)
Creatinine, Ser: 0.73 mg/dL (ref 0.44–1.00)
GLUCOSE: 145 mg/dL — AB (ref 65–99)
Potassium: 3.2 mmol/L — ABNORMAL LOW (ref 3.5–5.1)
Sodium: 139 mmol/L (ref 135–145)

## 2016-02-02 LAB — CBC
HEMATOCRIT: 40 % (ref 36.0–46.0)
HEMOGLOBIN: 12.9 g/dL (ref 12.0–15.0)
MCH: 26.8 pg (ref 26.0–34.0)
MCHC: 32.3 g/dL (ref 30.0–36.0)
MCV: 83 fL (ref 78.0–100.0)
Platelets: 258 10*3/uL (ref 150–400)
RBC: 4.82 MIL/uL (ref 3.87–5.11)
RDW: 13.7 % (ref 11.5–15.5)
WBC: 10.3 10*3/uL (ref 4.0–10.5)

## 2016-02-02 LAB — LACTIC ACID, PLASMA
Lactic Acid, Venous: 2.9 mmol/L (ref 0.5–1.9)
Lactic Acid, Venous: 1.4 mmol/L (ref 0.5–1.9)

## 2016-02-02 LAB — MAGNESIUM: Magnesium: 1.6 mg/dL — ABNORMAL LOW (ref 1.7–2.4)

## 2016-02-02 MED ORDER — PAROXETINE HCL 20 MG PO TABS
10.0000 mg | ORAL_TABLET | Freq: Every day | ORAL | Status: DC
Start: 2016-02-02 — End: 2016-02-05
  Administered 2016-02-02 – 2016-02-05 (×4): 10 mg via ORAL
  Filled 2016-02-02 (×5): qty 1

## 2016-02-02 MED ORDER — NICOTINE 21 MG/24HR TD PT24
21.0000 mg | MEDICATED_PATCH | Freq: Every day | TRANSDERMAL | Status: DC
  Administered 2016-02-02 – 2016-02-05 (×5): 21 mg via TRANSDERMAL
  Filled 2016-02-02 (×5): qty 1

## 2016-02-02 MED ORDER — SODIUM CHLORIDE 0.9 % IV SOLN
INTRAVENOUS | Status: DC
  Administered 2016-02-02: 10:00:00 via INTRAVENOUS

## 2016-02-02 MED ORDER — SODIUM CHLORIDE 0.9 % IV BOLUS (SEPSIS)
1000.0000 mL | Freq: Once | INTRAVENOUS | Status: AC
  Administered 2016-02-02: 1000 mL via INTRAVENOUS

## 2016-02-02 MED ORDER — ACETAMINOPHEN 325 MG PO TABS: 650.0000 mg | ORAL_TABLET | Freq: Four times a day (QID) | ORAL | Status: DC | PRN

## 2016-02-02 MED ORDER — ONDANSETRON HCL 4 MG PO TABS: 4.0000 mg | ORAL_TABLET | Freq: Four times a day (QID) | ORAL | Status: DC | PRN

## 2016-02-02 MED ORDER — ENOXAPARIN SODIUM 40 MG/0.4ML ~~LOC~~ SOLN
40.0000 mg | Freq: Every day | SUBCUTANEOUS | Status: DC
  Administered 2016-02-02 – 2016-02-05 (×4): 40 mg via SUBCUTANEOUS
  Filled 2016-02-02 (×4): qty 0.4

## 2016-02-02 MED ORDER — ACETAMINOPHEN 650 MG RE SUPP: 650.0000 mg | Freq: Four times a day (QID) | RECTAL | Status: DC | PRN

## 2016-02-02 MED ORDER — DEXTROSE 5 % IV SOLN
1.0000 g | INTRAVENOUS | Status: DC
  Administered 2016-02-02 – 2016-02-03 (×2): 1 g via INTRAVENOUS
  Filled 2016-02-02 (×2): qty 10

## 2016-02-02 MED ORDER — DEXTROSE 5 % IV SOLN
2.0000 g | Freq: Once | INTRAVENOUS | Status: AC
  Administered 2016-02-02: 2 g via INTRAVENOUS
  Filled 2016-02-02: qty 2

## 2016-02-02 MED ORDER — ONDANSETRON HCL 4 MG/2ML IJ SOLN: 4.0000 mg | Freq: Four times a day (QID) | INTRAMUSCULAR | Status: DC | PRN

## 2016-02-02 MED ORDER — POTASSIUM CHLORIDE IN NACL 20-0.9 MEQ/L-% IV SOLN
INTRAVENOUS | Status: DC
  Administered 2016-02-02 (×2): via INTRAVENOUS
  Filled 2016-02-02 (×2): qty 1000

## 2016-02-02 NOTE — ED Notes (Signed)
blodd cultures sitting in room but not clicked off in epic, I will contact nurse.

## 2016-02-02 NOTE — Progress Notes (Signed)
Pharmacy Antibiotic Note  Meribeth MattesKathryn Sudduth is a 66 y.o. female admitted on 02/01/2016 with UTI.  Pharmacy has been consulted for Ceftriaxone dosing.  Plan: See prior note for plan.  No need for renal adjustment with this antibiotic. Pharmacy will sign off.  Please re-consult if needed.   Height: 5\' 3"  (160 cm) Weight: 185 lb 12.8 oz (84.3 kg) IBW/kg (Calculated) : 52.4  Temp (24hrs), Avg:99.6 F (37.6 C), Min:98.4 F (36.9 C), Max:101.2 F (38.4 C)   Recent Labs Lab 02/01/16 2131 02/01/16 2143 02/02/16 0206 02/02/16 0527  WBC 10.6*  --   --  10.3  CREATININE 0.81  --   --  0.73  LATICACIDVEN  --  2.98* 2.9* 1.4    Estimated Creatinine Clearance: 73.1 mL/min (by C-G formula based on SCr of 0.73 mg/dL).    Allergies  Allergen Reactions  . Iodine Swelling  . Shrimp [Shellfish Allergy] Swelling    Fresh shrimp     Fayne NorrieMillen, Marrion Finan Brown 02/02/2016 8:37 AM

## 2016-02-02 NOTE — Progress Notes (Signed)
Pharmacy Antibiotic Note  Allison MattesKathryn Hughes is a 66 y.o. female admitted on 02/01/2016 with UTI.  Pharmacy has been consulted for Ceftriaxone dosing. WBC WNL. Lactic acid elevated. Abnormal U/A.   Plan: -Ceftriaxone 2g IV x 1 in the ED, then give 1g IV q24h -F/U urine culture for directed therapy  Height: 5\' 3"  (160 cm) Weight: 182 lb (82.6 kg) IBW/kg (Calculated) : 52.4  Temp (24hrs), Avg:100.3 F (37.9 C), Min:99.3 F (37.4 C), Max:101.2 F (38.4 C)   Recent Labs Lab 02/01/16 2131 02/01/16 2143  WBC 10.6*  --   CREATININE 0.81  --   LATICACIDVEN  --  2.98*    Estimated Creatinine Clearance: 71.4 mL/min (by C-G formula based on SCr of 0.81 mg/dL).    Allergies  Allergen Reactions  . Iodine Swelling  . Shrimp [Shellfish Allergy] Swelling    Fresh shrimp     Allison Hughes, Allison Hughes 02/02/2016 12:28 AM

## 2016-02-02 NOTE — Progress Notes (Addendum)
CRITICAL VALUE ALERT  Critical value received:  Lactic acid--2.9  Date of notification:  02/02/2016  Time of notification:  0309  Critical value read back:Yes.    Nurse who received alert:  Ardyth GalImelda Khori Underberg,RN  MD notified (1st page):  K.Sofia  Time of first page:  0310  MD notified (2nd page):  Time of second page:  Responding MD:  Dr Maryfrances Bunnellanford  Time MD responded:  0330

## 2016-02-02 NOTE — H&P (Signed)
History and Physical  Patient Name: Allison Hughes     ZOX:[id number]    DOB: Dec 02, 1950    DOA: 02/01/2016 PCP: Aida Puffer, MD   Patient coming from: Home  Chief Complaint: Myer Peer  HPI: Allison Hughes is a 66 y.o. female with a past medical history significant for HTN who presents with urinary symptoms for a few weeks and now weakness, fever.  The patient was in her usual state of health until a couple weeks ago when she started to notice dysuria, and intermittent incontinence. Then this week she noticed some urinary frequency, "having to pee all the time", and new foul smell to her urine.  Then in the last day she has felt incredibly weak, a new fever, has fallen several times, has been incontinent of urine and stool, and finally came to the emergency room today.  ED course: -Temp 101.54F, heart rate 111, respirations 18-34, pulse oximetry 92% on room air, blood pressure 170/94 -Na 136, K 3.0, Cr 0.81, WBC 10.8K, Hgb 14 -Lactic acid 2.98 -Urinalysis showed nitrates, leukocytes to numerous to count -Chest x-ray negative -Cultures were obtained, she was given ceftriaxone, potassium, and 2 L normal saline TRH was asked to evaluate for admission     ROS: Review of Systems  Constitutional: Positive for chills, fever and malaise/fatigue.  Genitourinary: Positive for dysuria and frequency.  Musculoskeletal: Positive for falls.  Neurological: Positive for weakness.  All other systems reviewed and are negative.         Past Medical History:  Diagnosis Date  . Diabetes mellitus 2006   T2DM  . Hyperlipidemia   . Hypertension   . Obesity (BMI 30-39.9)     Past Surgical History:  Procedure Laterality Date  . CESAREAN SECTION    . ORIF ANKLE FRACTURE  05/11/2011   Procedure: OPEN REDUCTION INTERNAL FIXATION (ORIF) ANKLE FRACTURE;  Surgeon: Nadara Mustard, MD;  Location: [facility] OR;  Service: Orthopedics;  Laterality: Right;  . TUBAL LIGATION      Social History: Patient  lives alone.  The patient walks unassisted.  She is a smoker.  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    Allergies  Allergen Reactions  . Iodine Swelling  . Shrimp [Shellfish Allergy] Swelling    Fresh shrimp    Family history: family history includes Diabetes in her daughter; Drug abuse in her daughter.  Prior to Admission medications   Medication Sig Start Date End Date Taking? Authorizing Provider  ALPRAZolam Prudy Feeler) 1 MG tablet Take 0.5-1 mg by mouth 2 (two) times daily as needed. For anxiety and sleep.   Yes Historical Provider, MD  ibuprofen (ADVIL,MOTRIN) 200 MG tablet Take 200 mg by mouth daily as needed. For back pain.   Yes Historical Provider, MD  lisinopril (PRINIVIL,ZESTRIL) 20 MG tablet Take 20 mg by mouth daily.   Yes Historical Provider, MD  PARoxetine (PAXIL) 20 MG tablet Take 10 mg by mouth daily.    Yes Historical Provider, MD  vitamin B-12 (CYANOCOBALAMIN) 1000 MCG tablet Take 1,000 mcg by mouth daily.     Yes Historical Provider, MD  Vitamin D, Ergocalciferol, (DRISDOL) 50000 UNITS CAPS Take 50,000 Units by mouth every 7 (seven) days.     Yes Historical Provider, MD       Physical Exam: BP (!) 147/76 (BP Location: Left Arm)   Pulse 92   Temp 98.4 F (36.9 C) (Oral)   Resp 20   Ht 5\' 3"  (1.6 m)   Wt 84.3 kg (185 lb 12.8  oz)   SpO2 99%   BMI 32.91 kg/m  General appearance: Well-developed, elderly obese adult female, alert but very somnolent and in no acut distress.   Eyes: Anicteric, conjunctiva pink, lids and lashes normal. PERRL.    ENT: No nasal deformity, discharge, epistaxis.  Hearing normal. OP moist without lesions.  Dentures. Neck: No neck masses.  Trachea midline.  No thyromegaly/tenderness. Lymph: No cervical or supraclavicular lymphadenopathy. Skin: Warm and dry.  No jaundice.  No suspicious rashes or lesions. Cardiac: Tachycardic, regular, nl S1-S2, no murmurs appreciated.  Capillary refill is brisk.  JVP not visible.  No LE edema.  Radial  and DP pulses 2+ and symmetric. Respiratory: Normal respiratory rate and rhythm.  CTAB without rales or wheezes. Abdomen: Abdomen soft.  Mild nonfocal TTP.  No CVA tenderness. No ascites, distension, hepatosplenomegaly.   MSK: No deformities or effusions.  No cyanosis or clubbing. Neuro: Cranial nerves grossly normal.  Sensation intact to light touch. Speech is fluent.  Muscle strength 5/5 and symmetric, moves all extremities with normal coordination.    Psych: Sensorium intact and responding to questions, attention diminished, sleepy.  Oriented to person, place and time.  Behavior appropriate.  Affect blunted.  Judgment and insight appear normal.     Labs on Admission:  I have personally reviewed following labs and imaging studies: CBC:  Recent Labs Lab 02/01/16 2131  WBC 10.6*  NEUTROABS 7.6  HGB 14.2  HCT 42.3  MCV 81.7  PLT 292   Basic Metabolic Panel:  Recent Labs Lab 02/01/16 2131 02/02/16 0206  NA 136  --   K 3.0*  --   CL 101  --   CO2 23  --   GLUCOSE 218*  --   BUN 6  --   CREATININE 0.81  --   CALCIUM 9.1  --   MG  --  1.6*   GFR: Estimated Creatinine Clearance: 72.2 mL/min (by C-G formula based on SCr of 0.81 mg/dL).  Liver Function Tests:  Recent Labs Lab 02/01/16 2131  AST 27  ALT 27  ALKPHOS 111  BILITOT 0.6  PROT 6.6  ALBUMIN 2.9*   No results for input(s): LIPASE, AMYLASE in the last 168 hours. No results for input(s): AMMONIA in the last 168 hours. Coagulation Profile: No results for input(s): INR, PROTIME in the last 168 hours. Cardiac Enzymes: No results for input(s): CKTOTAL, CKMB, CKMBINDEX, TROPONINI in the last 168 hours. BNP (last 3 results) No results for input(s): PROBNP in the last 8760 hours. HbA1C: No results for input(s): HGBA1C in the last 72 hours. CBG: No results for input(s): GLUCAP in the last 168 hours. Lipid Profile: No results for input(s): CHOL, HDL, LDLCALC, TRIG, CHOLHDL, LDLDIRECT in the last 72  hours. Thyroid Function Tests: No results for input(s): TSH, T4TOTAL, FREET4, T3FREE, THYROIDAB in the last 72 hours. Anemia Panel: No results for input(s): VITAMINB12, FOLATE, FERRITIN, TIBC, IRON, RETICCTPCT in the last 72 hours. Sepsis Labs: Lactic acid 2.98 Invalid input(s): PROCALCITONIN, LACTICIDVEN No results found for this or any previous visit (from the past 240 hour(s)).       Radiological Exams on Admission: Personally reviewed CXR shows no focal airspace disease: Dg Chest 2 View  Result Date: 02/01/2016 CLINICAL DATA:  Fever, multiple falls EXAM: CHEST  2 VIEW COMPARISON:  05/11/2011 FINDINGS: The heart size and mediastinal contours are within normal limits. Both lungs are clear. Mild degenerative changes of the spine. IMPRESSION: No active cardiopulmonary disease. Electronically Signed   By: Selena BattenKim  Jake Samples M.D.   On: 02/01/2016 21:58        Assessment/Plan  1. Sepsis from urinary source:  Suspected source urinary tract. Organism unknown.   Patient meets criteria given tachycardia, tachypnea, fever, and evidence of organ dysfunction.  Lactate 2.98 mmol/L and repeat ordered within 6 hours.  This patient is not at high risk of poor outcomes with a qSOFA score of 1 (at least 2 of the following clinical criteria: respiratory rate of 22/min or greater, altered mentation, or systolic blood pressure of 100 mm Hg or less).  Antibiotics delivered in the ED.    -Sepsis bundle utilized:  -Blood and urine cultures drawn  -Trend lactic acid  -Antibiotics: ceftriaxone  -Repeat renal function and complete blood count in AM  -Code SEPSIS called to E-link   2. Hypertension:  -Hold ACEi until hemodynamics clearer  3. Hypokalemia:  Supplemented in ER -Trend BMP -Check magnesium and supplement if low  4. Other medications:  -Continue Paxil      DVT prophylaxis: Lovenox  Code Status: FULL  Family Communication: None present  Disposition Plan: Anticipate IV fluids,  antibiotisc and follow urine culture.   Consults called: None Admission status: INPATIENT        Medical decision making: Patient seen at 12:20 AM on 02/02/2016.  The patient was discussed with Dierdre Forth, PA-C.  What exists of the patient's chart was reviewed in depth and summarized above.  Clinical condition: heart rate fast and lactic acid elevated, but BP has remained stable and patient mentating well/oriented, just sleepy because of the time.        Alberteen Sam [facility] Pager [phone]      At the time of admission, it appears that the appropriate admission status for this patient is INPATIENT. This is judged to be reasonable and necessary in order to provide the required intensity of service to ensure the patient's safety given the presenting symptoms, physical exam findings, and initial radiographic and laboratory data in the context of their chronic comorbidities.  Together, these circumstances are felt to place her at high risk for further clinical deterioration threatening life, limb, or organ.   Patient requires inpatient status due to high intensity of service, high risk for further deterioration and high frequency of surveillance required because of this acute illness that poses a threat to life, limb or bodily function.  Factors that support inpatient status include: Urinary tract infection with evidence of sepsis (SIRS criteria and elevated lactic acid)  I certify that at the point of admission it is my clinical judgment that the patient will require inpatient hospital care spanning beyond 2 midnights from the point of admission and that early discharge would result in unnecessary risk of decompensation and readmission or threat to life, limb or bodily function.

## 2016-02-02 NOTE — Progress Notes (Signed)
Patient is seen examined. Please see today's H&P. Briefly: 66 y.o. female with PMH of HTN, tobacco use who presents with urinary symptoms, weakness, fever and found to have urinary tract infection, sepsis  UTI. Sepsis. Febrile in ED. Lactic acid 2.9, tachycardia but hemodynamically stable.  -we will cont iv fluids, iv ceftriaxone, f/u cultures. Monitor. Lactic acidosis->resolved    Hypokalemia. Replace, monitor . Esperanza SheetsBURIEV, Cadee Agro N

## 2016-02-03 LAB — GLUCOSE, CAPILLARY
GLUCOSE-CAPILLARY: 207 mg/dL — AB (ref 65–99)
Glucose-Capillary: 224 mg/dL — ABNORMAL HIGH (ref 65–99)
Glucose-Capillary: 216 mg/dL — ABNORMAL HIGH (ref 65–99)

## 2016-02-03 LAB — BASIC METABOLIC PANEL
ANION GAP: 9 (ref 5–15)
CO2: 24 mmol/L (ref 22–32)
Calcium: 8.4 mg/dL — ABNORMAL LOW (ref 8.9–10.3)
Chloride: 105 mmol/L (ref 101–111)
Creatinine, Ser: 0.69 mg/dL (ref 0.44–1.00)
Glucose, Bld: 198 mg/dL — ABNORMAL HIGH (ref 65–99)
POTASSIUM: 3.3 mmol/L — AB (ref 3.5–5.1)
SODIUM: 138 mmol/L (ref 135–145)

## 2016-02-03 MED ORDER — INSULIN ASPART 100 UNIT/ML ~~LOC~~ SOLN
0.0000 [IU] | Freq: Three times a day (TID) | SUBCUTANEOUS | Status: DC
  Administered 2016-02-03 (×2): 3 [IU] via SUBCUTANEOUS
  Administered 2016-02-04: 1 [IU] via SUBCUTANEOUS
  Administered 2016-02-04: 5 [IU] via SUBCUTANEOUS
  Administered 2016-02-04 – 2016-02-05 (×2): 2 [IU] via SUBCUTANEOUS
  Administered 2016-02-05: 5 [IU] via SUBCUTANEOUS

## 2016-02-03 MED ORDER — INSULIN ASPART 100 UNIT/ML ~~LOC~~ SOLN
0.0000 [IU] | Freq: Every day | SUBCUTANEOUS | Status: DC
  Administered 2016-02-03 – 2016-02-04 (×2): 2 [IU] via SUBCUTANEOUS

## 2016-02-03 MED ORDER — LISINOPRIL 10 MG PO TABS
10.0000 mg | ORAL_TABLET | Freq: Every day | ORAL | Status: DC
  Administered 2016-02-03 – 2016-02-04 (×2): 10 mg via ORAL
  Filled 2016-02-03 (×2): qty 1

## 2016-02-03 MED ORDER — MAGNESIUM OXIDE 400 (241.3 MG) MG PO TABS
400.0000 mg | ORAL_TABLET | Freq: Every day | ORAL | Status: DC
  Administered 2016-02-03 – 2016-02-05 (×3): 400 mg via ORAL
  Filled 2016-02-03 (×3): qty 1

## 2016-02-03 MED ORDER — HYDRALAZINE HCL 20 MG/ML IJ SOLN
5.0000 mg | Freq: Four times a day (QID) | INTRAMUSCULAR | Status: DC | PRN
  Administered 2016-02-03 – 2016-02-04 (×2): 5 mg via INTRAVENOUS
  Filled 2016-02-03 (×2): qty 1

## 2016-02-03 MED ORDER — POTASSIUM CHLORIDE CRYS ER 20 MEQ PO TBCR
20.0000 meq | EXTENDED_RELEASE_TABLET | Freq: Once | ORAL | Status: AC
  Administered 2016-02-03: 20 meq via ORAL
  Filled 2016-02-03: qty 1

## 2016-02-03 NOTE — Progress Notes (Signed)
Patient ID: Allison Hughes, female   DOB: [birth year], 66 y.o.   MRN: [id number]                                                                PROGRESS NOTE                                                                                                                                                                                                             Patient Demographics:    Allison Hughes, is a 66 y.o. female, DOB - 14-Nov-1950, WUJ:[id number]  Admit date - 02/01/2016   Admitting Physician Alberteen Sam, MD  Outpatient Primary MD for the patient is Aida Puffer, MD  LOS - 1  Outpatient Specialists:   Chief Complaint  Patient presents with  . Dysuria       Brief Narrative   66 y.o. female with a past medical history significant for HTN who presents with urinary symptoms for a few weeks and now weakness, fever.  The patient was in her usual state of health until a couple weeks ago when she started to notice dysuria, and intermittent incontinence. Then this week she noticed some urinary frequency, "having to pee all the time", and new foul smell to her urine.  Then in the last day she has felt incredibly weak, a new fever, has fallen several times, has been incontinent of urine and stool, and finally came to the emergency room today.  ED course: -Temp 101.31F, heart rate 111, respirations 18-34, pulse oximetry 92% on room air, blood pressure 170/94 -Na 136, K 3.0, Cr 0.81, WBC 10.8K, Hgb 14 -Lactic acid 2.98 -Urinalysis showed nitrates, leukocytes to numerous to count -Chest x-ray negative -Cultures were obtained, she was given ceftriaxone, potassium, and 2 L normal saline TRH was asked to evaluate for admission     Subjective:    Allison Hughes today has feeling better.  Denies dysuria, hematuria, fever, chills, flank pain. Pt tolerating rocephin. Urine culture pending  No headache, No chest pain, No abdominal pain - No Nausea, No new weakness tingling or  numbness, No Cough - SOB.    Assessment  & Plan :    Principal Problem:   Sepsis (HCC) Active Problems:   Acute pyelonephritis   Hypokalemia   Anxiety   Essential hypertension  1. Sepsis from urinary source:  Urine culture pending Continue rocephin  2. Hypertension:  Restart on lisinopril 10mg  po qday Hydralazine 5mg  iv q6h prn   3. Hypokalemia:  Kcl 20 meq po x1 Check cmp in am  4. Other medications:  -Continue Paxil  5. Hypomagnesemia Replete Check magnesium in am  6. DM2 iss     Code Status : FULL CODE  Family Communication  : w patient  Disposition Plan  : home  Barriers For Discharge :   Consults  :  none  Procedures  :   DVT Prophylaxis  :  Lovenox - Heparin - SCDs   Lab Results  Component Value Date   PLT 258 02/02/2016    Antibiotics  :    Anti-infectives    Start     Dose/Rate Route Frequency Ordered Stop   02/02/16 2200  cefTRIAXone (ROCEPHIN) 1 g in dextrose 5 % 50 mL IVPB     1 g 100 mL/hr over 30 Minutes Intravenous Every 24 hours 02/02/16 0028     02/02/16 0015  cefTRIAXone (ROCEPHIN) 2 g in dextrose 5 % 50 mL IVPB     2 g 100 mL/hr over 30 Minutes Intravenous  Once 02/02/16 0010 02/02/16 0116        Objective:   Vitals:   02/02/16 0546 02/02/16 1427 02/02/16 2115 02/03/16 0419  BP: (!) 150/74 (!) 150/86 (!) 152/80 (!) 168/61  Pulse: 90 90 91 89  Resp: 17 17 18 18   Temp: 99.4 F (37.4 C) 98.8 F (37.1 C) 98.2 F (36.8 C) 98.4 F (36.9 C)  TempSrc: Oral Oral Oral Oral  SpO2: 92% 94% 92% 92%  Weight:      Height:        Wt Readings from Last 3 Encounters:  02/02/16 84.3 kg (185 lb 12.8 oz)  07/31/10 77.6 kg (171 lb 1.6 oz)     Intake/Output Summary (Last 24 hours) at 02/03/16 0753 Last data filed at 02/03/16 0516  Gross per 24 hour  Intake             2815 ml  Output                0 ml  Net             2815 ml     Physical Exam  Awake Alert, Oriented X 3, No new F.N deficits, Normal  affect [place].AT,PERRAL Supple Neck,No JVD, No cervical lymphadenopathy appriciated.  Symmetrical Chest wall movement, Good air movement bilaterally, CTAB RRR,No Gallops,Rubs or new Murmurs, No Parasternal Heave +ve B.Sounds, Abd Soft, No tenderness, No organomegaly appriciated, No rebound - guarding or rigidity. No Cyanosis, Clubbing or edema, No new Rash or bruise      Data Review:    CBC  Recent Labs Lab 02/01/16 2131 02/02/16 0527  WBC 10.6* 10.3  HGB 14.2 12.9  HCT 42.3 40.0  PLT 292 258  MCV 81.7 83.0  MCH 27.4 26.8  MCHC 33.6 32.3  RDW 13.6 13.7  LYMPHSABS 2.2  --   MONOABS 0.8  --   EOSABS 0.1  --   BASOSABS 0.0  --     Chemistries   Recent Labs Lab 02/01/16 2131 02/02/16 0206 02/02/16 0527 02/03/16 0449  NA 136  --  139 138  K 3.0*  --  3.2* 3.3*  CL 101  --  105 105  CO2 23  --  24 24  GLUCOSE 218*  --  145* 198*  BUN 6  --  <5* <5*  CREATININE 0.81  --  0.73 0.69  CALCIUM 9.1  --  7.7* 8.4*  MG  --  1.6*  --   --   AST 27  --   --   --   ALT 27  --   --   --   ALKPHOS 111  --   --   --   BILITOT 0.6  --   --   --    ------------------------------------------------------------------------------------------------------------------ No results for input(s): CHOL, HDL, LDLCALC, TRIG, CHOLHDL, LDLDIRECT in the last 72 hours.  No results found for: HGBA1C ------------------------------------------------------------------------------------------------------------------ No results for input(s): TSH, T4TOTAL, T3FREE, THYROIDAB in the last 72 hours.  Invalid input(s): FREET3 ------------------------------------------------------------------------------------------------------------------ No results for input(s): VITAMINB12, FOLATE, FERRITIN, TIBC, IRON, RETICCTPCT in the last 72 hours.  Coagulation profile No results for input(s): INR, PROTIME in the last 168 hours.  No results for input(s): DDIMER in the last 72 hours.  Cardiac Enzymes No results  for input(s): CKMB, TROPONINI, MYOGLOBIN in the last 168 hours.  Invalid input(s): CK ------------------------------------------------------------------------------------------------------------------ No results found for: BNP  Inpatient Medications  Scheduled Meds: . cefTRIAXone (ROCEPHIN)  IV  1 g Intravenous Q24H  . enoxaparin (LOVENOX) injection  40 mg Subcutaneous Daily  . nicotine  21 mg Transdermal Daily  . PARoxetine  10 mg Oral Daily   Continuous Infusions: . 0.9 % NaCl with KCl 20 mEq / L 100 mL/hr at 02/02/16 2225   PRN Meds:.acetaminophen **OR** acetaminophen, hydrALAZINE, ondansetron **OR** ondansetron (ZOFRAN) IV  Micro Results Recent Results (from the past 240 hour(s))  Blood Culture (routine x 2)     Status: None (Preliminary result)   Collection Time: 02/02/16 12:51 AM  Result Value Ref Range Status   Specimen Description BLOOD LEFT HAND  Final   Special Requests IN PEDIATRIC BOTTLE  Final   Culture NO GROWTH < 12 HOURS  Final   Report Status PENDING  Incomplete  Blood Culture (routine x 2)     Status: None (Preliminary result)   Collection Time: 02/02/16  2:06 AM  Result Value Ref Range Status   Specimen Description BLOOD RIGHT ANTECUBITAL  Final   Special Requests BOTTLES DRAWN AEROBIC AND ANAEROBIC 5CC EA  Final   Culture NO GROWTH < 12 HOURS  Final   Report Status PENDING  Incomplete    Radiology Reports Dg Chest 2 View  Result Date: 02/01/2016 CLINICAL DATA:  Fever, multiple falls EXAM: CHEST  2 VIEW COMPARISON:  05/11/2011 FINDINGS: The heart size and mediastinal contours are within normal limits. Both lungs are clear. Mild degenerative changes of the spine. IMPRESSION: No active cardiopulmonary disease. Electronically Signed   By: Jasmine Pang M.D.   On: 02/01/2016 21:58    Time Spent in minutes  30   Pearson Grippe M.D on 02/03/2016 at 7:53 AM  Between 7am to 7pm - Pager - [phone]  After 7pm go to [email or web address] - password [facility]  [facility] -  Office  [phone]

## 2016-02-04 LAB — CBC
HCT: 40.8 % (ref 36.0–46.0)
Hemoglobin: 13.7 g/dL (ref 12.0–15.0)
MCH: 27.1 pg (ref 26.0–34.0)
MCHC: 33.6 g/dL (ref 30.0–36.0)
MCV: 80.6 fL (ref 78.0–100.0)
PLATELETS: 321 10*3/uL (ref 150–400)
RBC: 5.06 MIL/uL (ref 3.87–5.11)
RDW: 13.1 % (ref 11.5–15.5)
WBC: 8.6 10*3/uL (ref 4.0–10.5)

## 2016-02-04 LAB — GLUCOSE, CAPILLARY
Glucose-Capillary: 185 mg/dL — ABNORMAL HIGH (ref 65–99)
Glucose-Capillary: 147 mg/dL — ABNORMAL HIGH (ref 65–99)
Glucose-Capillary: 262 mg/dL — ABNORMAL HIGH (ref 65–99)
Glucose-Capillary: 211 mg/dL — ABNORMAL HIGH (ref 65–99)

## 2016-02-04 LAB — COMPREHENSIVE METABOLIC PANEL
ALT: 36 U/L (ref 14–54)
AST: 30 U/L (ref 15–41)
Albumin: 2.8 g/dL — ABNORMAL LOW (ref 3.5–5.0)
Alkaline Phosphatase: 136 U/L — ABNORMAL HIGH (ref 38–126)
Anion gap: 9 (ref 5–15)
BUN: 7 mg/dL (ref 6–20)
CHLORIDE: 103 mmol/L (ref 101–111)
CO2: 27 mmol/L (ref 22–32)
CREATININE: 0.76 mg/dL (ref 0.44–1.00)
Calcium: 9.3 mg/dL (ref 8.9–10.3)
GFR calc Af Amer: >60 mL/min (ref >60)
GFR calc non Af Amer: >60 mL/min (ref >60)
Glucose, Bld: 190 mg/dL — ABNORMAL HIGH (ref 65–99)
Potassium: 3.5 mmol/L (ref 3.5–5.1)
SODIUM: 139 mmol/L (ref 135–145)
Total Bilirubin: 0.5 mg/dL (ref 0.3–1.2)
Total Protein: 6.5 g/dL (ref 6.5–8.1)

## 2016-02-04 LAB — MAGNESIUM: Magnesium: 1.7 mg/dL (ref 1.7–2.4)

## 2016-02-04 LAB — URINE CULTURE

## 2016-02-04 MED ORDER — LISINOPRIL 20 MG PO TABS
20.0000 mg | ORAL_TABLET | Freq: Every day | ORAL | Status: DC
  Administered 2016-02-05: 20 mg via ORAL
  Filled 2016-02-04: qty 1

## 2016-02-04 MED ORDER — LISINOPRIL 10 MG PO TABS
10.0000 mg | ORAL_TABLET | Freq: Once | ORAL | Status: AC
  Administered 2016-02-04: 10 mg via ORAL
  Filled 2016-02-04: qty 1

## 2016-02-04 MED ORDER — CEPHALEXIN 500 MG PO CAPS
500.0000 mg | ORAL_CAPSULE | Freq: Two times a day (BID) | ORAL | Status: DC
  Administered 2016-02-04 – 2016-02-05 (×3): 500 mg via ORAL
  Filled 2016-02-04 (×3): qty 1

## 2016-02-04 NOTE — Progress Notes (Signed)
Patient ID: Allison Hughes, female   DOB: 05-10-1950, 66 y.o.   MRN: [id number]                                                                PROGRESS NOTE                                                                                                                                                                                                             Patient Demographics:    Allison Hughes, is a 66 y.o. female, DOB - 05-10-1950, [id number]  Admit date - 02/01/2016   Admitting Physician Alberteen Samhristopher P Danford, MD  Outpatient Primary MD for the patient is Aida PufferLITTLE,Kaiden Pech, MD  LOS - 2  Outpatient Specialists:   Chief Complaint  Patient presents with  . Dysuria       Brief Narrative  66 y.o.femalewith a past medical history significant for HTN who presents with urinary symptoms for a few weeks and now weakness, fever.  The patient was in her usual state of health until a couple weeks ago when she started to notice dysuria, and intermittent incontinence. Then this week she noticed some urinary frequency, "having to pee all thetime", and new foul smell to her urine. Then in the last day she has felt incredibly weak, a new fever, has fallen several times, has been incontinent of urine and stool, and finally came to the emergency room today.  ED course: -Temp 101.49F, heart rate 111, respirations 18-34,pulse oximetry 92% on room air, blood pressure 170/94 -Na 136, K 3.0, Cr 0.81, WBC 10.8K, Hgb 14 -Lactic acid 2.98 -Urinalysis showed nitrates, leukocytes to numerous to count -Chest x-ray negative -Cultures were obtained, she was given ceftriaxone, potassium, and 2 L normal saline TRH was asked to evaluate for admission    Subjective:    Allison Hughes today has been feeling better.  denies fever, chills, n/v, flank pain, pt is tolerating rocephin.  Urine culture grew E. Coli sensitive to rocephin    Assessment  & Plan :    Principal Problem:   Sepsis (HCC) Active  Problems:   Acute pyelonephritis   Hypokalemia   Anxiety   Essential hypertension    1. Sepsis from urinary source: Urine culture=> E. coli D/c  rocephin Start on keflex 500mg  po bid  2. Hypertension: Increase lisinopril 20mg  po qday Hydralazine 5mg  iv q6h prn   3. Hypokalemia: Resolved  4. Other medications: -Continue Paxil  5. Hypomagnesemia Replete Check magnesium in am  6. DM2 iss     Code Status : FULL CODE  Family Communication  : w patient  Disposition Plan  : home tomorrow  Barriers For Discharge :   Consults  :  none  Procedures  :   DVT Prophylaxis  :  Lovenox -  SCDs    Lab Results  Component Value Date   PLT 321 02/04/2016    Antibiotics  :  Rocephin 1/19=> 1/21  Anti-infectives    Start     Dose/Rate Route Frequency Ordered Stop   02/02/16 2200  cefTRIAXone (ROCEPHIN) 1 g in dextrose 5 % 50 mL IVPB     1 g 100 mL/hr over 30 Minutes Intravenous Every 24 hours 02/02/16 0028     02/02/16 0015  cefTRIAXone (ROCEPHIN) 2 g in dextrose 5 % 50 mL IVPB     2 g 100 mL/hr over 30 Minutes Intravenous  Once 02/02/16 0010 02/02/16 0116        Objective:   Vitals:   02/03/16 1450 02/03/16 2141 02/04/16 0433 02/04/16 1330  BP: (!) 173/84 (!) 183/91 (!) 178/95 (!) 150/92  Pulse: 82 86 84 87  Resp: 18 18 18 18   Temp: 97.3 F (36.3 C) 98.4 F (36.9 C) 98.2 F (36.8 C) 97.8 F (36.6 C)  TempSrc: Oral Oral Oral Oral  SpO2: 96% 92% 92% 96%  Weight:      Height:        Wt Readings from Last 3 Encounters:  02/02/16 84.3 kg (185 lb 12.8 oz)  07/31/10 77.6 kg (171 lb 1.6 oz)     Intake/Output Summary (Last 24 hours) at 02/04/16 1423 Last data filed at 02/04/16 1356  Gross per 24 hour  Intake             1130 ml  Output                0 ml  Net             1130 ml     Physical Exam  Awake Alert, Oriented X 3, No new F.N deficits, Normal affect [place].AT,PERRAL Supple Neck,No JVD, No cervical lymphadenopathy  appriciated.  Symmetrical Chest wall movement, Good air movement bilaterally, CTAB RRR,No Gallops,Rubs or new Murmurs, No Parasternal Heave +ve B.Sounds, Abd Soft, No tenderness, No organomegaly appriciated, No rebound - guarding or rigidity. No Cyanosis, Clubbing or edema, No new Rash or bruise   No cva tenderness    Data Review:    CBC  Recent Labs Lab 02/01/16 2131 02/02/16 0527 02/04/16 0415  WBC 10.6* 10.3 8.6  HGB 14.2 12.9 13.7  HCT 42.3 40.0 40.8  PLT 292 258 321  MCV 81.7 83.0 80.6  MCH 27.4 26.8 27.1  MCHC 33.6 32.3 33.6  RDW 13.6 13.7 13.1  LYMPHSABS 2.2  --   --   MONOABS 0.8  --   --   EOSABS 0.1  --   --   BASOSABS 0.0  --   --     Chemistries   Recent Labs Lab 02/01/16 2131 02/02/16 0206 02/02/16 0527 02/03/16 0449 02/04/16 0415  NA 136  --  139 138 139  K 3.0*  --  3.2* 3.3* 3.5  CL 101  --  105 105 103  CO2 23  --  24  24 27  GLUCOSE 218*  --  145* 198* 190*  BUN 6  --  <5* <5* 7  CREATININE 0.81  --  0.73 0.69 0.76  CALCIUM 9.1  --  7.7* 8.4* 9.3  MG  --  1.6*  --   --  1.7  AST 27  --   --   --  30  ALT 27  --   --   --  36  ALKPHOS 111  --   --   --  136*  BILITOT 0.6  --   --   --  0.5   ------------------------------------------------------------------------------------------------------------------ No results for input(s): CHOL, HDL, LDLCALC, TRIG, CHOLHDL, LDLDIRECT in the last 72 hours.  No results found for: HGBA1C ------------------------------------------------------------------------------------------------------------------ No results for input(s): TSH, T4TOTAL, T3FREE, THYROIDAB in the last 72 hours.  Invalid input(s): FREET3 ------------------------------------------------------------------------------------------------------------------ No results for input(s): VITAMINB12, FOLATE, FERRITIN, TIBC, IRON, RETICCTPCT in the last 72 hours.  Coagulation profile No results for input(s): INR, PROTIME in the last 168  hours.  No results for input(s): DDIMER in the last 72 hours.  Cardiac Enzymes No results for input(s): CKMB, TROPONINI, MYOGLOBIN in the last 168 hours.  Invalid input(s): CK ------------------------------------------------------------------------------------------------------------------ No results found for: BNP  Inpatient Medications  Scheduled Meds: . cefTRIAXone (ROCEPHIN)  IV  1 g Intravenous Q24H  . enoxaparin (LOVENOX) injection  40 mg Subcutaneous Daily  . insulin aspart  0-5 Units Subcutaneous QHS  . insulin aspart  0-9 Units Subcutaneous TID WC  . lisinopril  10 mg Oral Daily  . magnesium oxide  400 mg Oral Daily  . nicotine  21 mg Transdermal Daily  . PARoxetine  10 mg Oral Daily   Continuous Infusions: PRN Meds:.acetaminophen **OR** acetaminophen, hydrALAZINE, ondansetron **OR** ondansetron (ZOFRAN) IV  Micro Results Recent Results (from the past 240 hour(s))  Urine culture     Status: Abnormal   Collection Time: 02/01/16 10:45 PM  Result Value Ref Range Status   Specimen Description URINE, CLEAN CATCH  Final   Special Requests NONE  Final   Culture >=100,000 COLONIES/mL ESCHERICHIA COLI (A)  Final   Report Status 02/04/2016 FINAL  Final   Organism ID, Bacteria ESCHERICHIA COLI (A)  Final      Susceptibility   Escherichia coli - MIC*    AMPICILLIN <=2 SENSITIVE Sensitive     CEFAZOLIN <=4 SENSITIVE Sensitive     CEFTRIAXONE <=1 SENSITIVE Sensitive     CIPROFLOXACIN <=0.25 SENSITIVE Sensitive     GENTAMICIN <=1 SENSITIVE Sensitive     IMIPENEM <=0.25 SENSITIVE Sensitive     NITROFURANTOIN <=16 SENSITIVE Sensitive     TRIMETH/SULFA <=20 SENSITIVE Sensitive     AMPICILLIN/SULBACTAM <=2 SENSITIVE Sensitive     PIP/TAZO <=4 SENSITIVE Sensitive     Extended ESBL NEGATIVE Sensitive     * >=100,000 COLONIES/mL ESCHERICHIA COLI  Blood Culture (routine x 2)     Status: None (Preliminary result)   Collection Time: 02/02/16 12:51 AM  Result Value Ref Range Status    Specimen Description BLOOD LEFT HAND  Final   Special Requests IN PEDIATRIC BOTTLE  Final   Culture NO GROWTH 1 DAY  Final   Report Status PENDING  Incomplete  Blood Culture (routine x 2)     Status: None (Preliminary result)   Collection Time: 02/02/16  2:06 AM  Result Value Ref Range Status   Specimen Description BLOOD RIGHT ANTECUBITAL  Final   Special Requests BOTTLES DRAWN AEROBIC AND ANAEROBIC 5CC EA  Final   Culture NO GROWTH 1 DAY  Final   Report Status PENDING  Incomplete    Radiology Reports Dg Chest 2 View  Result Date: 02/01/2016 CLINICAL DATA:  Fever, multiple falls EXAM: CHEST  2 VIEW COMPARISON:  05/11/2011 FINDINGS: The heart size and mediastinal contours are within normal limits. Both lungs are clear. Mild degenerative changes of the spine. IMPRESSION: No active cardiopulmonary disease. Electronically Signed   By: Jasmine Pang M.D.   On: 02/01/2016 21:58    Time Spent in minutes  30   Pearson Grippe M.D on 02/04/2016 at 2:23 PM  Between 7am to 7pm - Pager - [phone]  After 7pm go to [email or web address] - password [facility]  [facility] -  Office  [phone]

## 2016-02-05 DIAGNOSIS — A419 Sepsis, unspecified organism: Principal | ICD-10-CM

## 2016-02-05 DIAGNOSIS — E876 Hypokalemia: Secondary | ICD-10-CM

## 2016-02-05 DIAGNOSIS — I10 Essential (primary) hypertension: Secondary | ICD-10-CM

## 2016-02-05 DIAGNOSIS — N10 Acute tubulo-interstitial nephritis: Secondary | ICD-10-CM

## 2016-02-05 DIAGNOSIS — F419 Anxiety disorder, unspecified: Secondary | ICD-10-CM

## 2016-02-05 LAB — BASIC METABOLIC PANEL
ANION GAP: 8 (ref 5–15)
BUN: 7 mg/dL (ref 6–20)
CALCIUM: 8.7 mg/dL — AB (ref 8.9–10.3)
CO2: 26 mmol/L (ref 22–32)
Chloride: 102 mmol/L (ref 101–111)
Creatinine, Ser: 0.66 mg/dL (ref 0.44–1.00)
Glucose, Bld: 199 mg/dL — ABNORMAL HIGH (ref 65–99)
POTASSIUM: 3.4 mmol/L — AB (ref 3.5–5.1)
SODIUM: 136 mmol/L (ref 135–145)

## 2016-02-05 LAB — GLUCOSE, CAPILLARY
GLUCOSE-CAPILLARY: 187 mg/dL — AB (ref 65–99)
GLUCOSE-CAPILLARY: 258 mg/dL — AB (ref 65–99)

## 2016-02-05 MED ORDER — POTASSIUM CHLORIDE CRYS ER 20 MEQ PO TBCR
60.0000 meq | EXTENDED_RELEASE_TABLET | Freq: Once | ORAL | Status: AC
  Administered 2016-02-05: 60 meq via ORAL
  Filled 2016-02-05: qty 3

## 2016-02-05 MED ORDER — CEPHALEXIN 500 MG PO CAPS: 500.0000 mg | ORAL_CAPSULE | Freq: Two times a day (BID) | ORAL | 0 refills | Status: DC

## 2016-02-05 NOTE — Discharge Summary (Signed)
Physician Discharge Summary  Rin Gorton QVZ:[id number] DOB: [birth year] DOA: 02/01/2016  PCP: Aida Puffer, MD  Admit date: 02/01/2016 Discharge date: 02/05/2016  Admitted From: Home Disposition: Home  Recommendations for Outpatient Follow-up:  1. Follow up with PCP in 1-2 weeks 2. Please obtain BMP/CBC in one week  Home Health: NA Equipment/Devices:NA  Discharge Condition: Stable CODE STATUS: Full Code Diet recommendation: Diet regular Room service appropriate? Yes; Fluid consistency: Thin Diet - low sodium heart healthy  Brief/Interim Summary: 66 y.o.femalewith a past medical history significant for HTN who presents with urinary symptoms for a few weeks and now weakness, fever. The patient was in her usual state of health until a couple weeks ago when she started to notice dysuria, and intermittent incontinence. Then this week she noticed some urinary frequency, "having to pee all thetime", and new foul smell to her urine. Then in the last day she has felt incredibly weak, a new fever, has fallen several times, has been incontinent of urine and stool, and finally came to the emergency room today.  Discharge Diagnoses:  Principal Problem:   Sepsis (HCC) Active Problems:   Acute pyelonephritis   Hypokalemia   Anxiety   Essential hypertension    1. Sepsis from urinary source: Urine culture=> E. coli The blood culture is negative and no flank pain to suggest pyelonephritis Discharge on Keflex 500 mg twice a day for 5 more days  2. Hypertension: Resume lisinopril at 20 mg by mouth daily reasonably controlled. Hydralazine 5mg  iv q6h prn   3. Hypokalemia: Potassium of 3.2, resolved with oral supplements.  4. Other medications: Continue Paxil  5. 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 I recommended for her to take multivitamins with minerals.  6. DM2 iss  Discharge Instructions  Discharge Instructions    Diet - low  sodium heart healthy    Complete by:  As directed    Increase activity slowly    Complete by:  As directed      Allergies as of 02/05/2016      Reactions   Iodine Swelling   Shrimp [shellfish Allergy] Swelling   Fresh shrimp      Medication List    TAKE these medications   ALPRAZolam 1 MG tablet Commonly known as:  XANAX Take 0.5-1 mg by mouth 2 (two) times daily as needed. For anxiety and sleep.   cephALEXin 500 MG capsule Commonly known as:  KEFLEX Take 1 capsule (500 mg total) by mouth every 12 (twelve) hours.   ibuprofen 200 MG tablet Commonly known as:  ADVIL,MOTRIN Take 200 mg by mouth daily as needed. For back pain.   lisinopril 20 MG tablet Commonly known as:  PRINIVIL,ZESTRIL Take 20 mg by mouth daily.   PARoxetine 20 MG tablet Commonly known as:  PAXIL Take 10 mg by mouth daily.   vitamin B-12 1000 MCG tablet Commonly known as:  CYANOCOBALAMIN Take 1,000 mcg by mouth daily.   Vitamin D (Ergocalciferol) 50000 units Caps capsule Commonly known as:  DRISDOL Take 50,000 Units by mouth every 7 (seven) days.       Allergies  Allergen Reactions  . Iodine Swelling  . Shrimp [Shellfish Allergy] Swelling    Fresh shrimp    Consultations:  None   Procedures (Echo, Carotid, EGD, Colonoscopy, ERCP)  None  Radiological studies: Dg Chest 2 View  Result Date: 02/01/2016 CLINICAL DATA:  Fever, multiple falls EXAM: CHEST  2 VIEW COMPARISON:  05/11/2011 FINDINGS: The heart size and mediastinal contours are within  normal limits. Both lungs are clear. Mild degenerative changes of the spine. IMPRESSION: No active cardiopulmonary disease. Electronically Signed   By: Jasmine PangKim  Fujinaga M.D.   On: 02/01/2016 21:58     Subjective:  Discharge Exam: Vitals:   02/04/16 1330 02/04/16 2125 02/04/16 2200 02/05/16 0533  BP: (!) 150/92 (!) 180/80 (!) 170/82 138/70  Pulse: 87 82 87 85  Resp: 18 18  15   Temp: 97.8 F (36.6 C) 97.6 F (36.4 C)  98.3 F (36.8 C)   TempSrc: Oral Oral  Oral  SpO2: 96% 96%  94%  Weight:      Height:       General: Pt is alert, awake, not in acute distress Cardiovascular: RRR, S1/S2 +, no rubs, no gallops Respiratory: CTA bilaterally, no wheezing, no rhonchi Abdominal: Soft, NT, ND, bowel sounds + Extremities: no edema, no cyanosis   The results of significant diagnostics from this hospitalization (including imaging, microbiology, ancillary and laboratory) are listed below for reference.    Microbiology: Recent Results (from the past 240 hour(s))  Urine culture     Status: Abnormal   Collection Time: 02/01/16 10:45 PM  Result Value Ref Range Status   Specimen Description URINE, CLEAN CATCH  Final   Special Requests NONE  Final   Culture >=100,000 COLONIES/mL ESCHERICHIA COLI (A)  Final   Report Status 02/04/2016 FINAL  Final   Organism ID, Bacteria ESCHERICHIA COLI (A)  Final      Susceptibility   Escherichia coli - MIC*    AMPICILLIN <=2 SENSITIVE Sensitive     CEFAZOLIN <=4 SENSITIVE Sensitive     CEFTRIAXONE <=1 SENSITIVE Sensitive     CIPROFLOXACIN <=0.25 SENSITIVE Sensitive     GENTAMICIN <=1 SENSITIVE Sensitive     IMIPENEM <=0.25 SENSITIVE Sensitive     NITROFURANTOIN <=16 SENSITIVE Sensitive     TRIMETH/SULFA <=20 SENSITIVE Sensitive     AMPICILLIN/SULBACTAM <=2 SENSITIVE Sensitive     PIP/TAZO <=4 SENSITIVE Sensitive     Extended ESBL NEGATIVE Sensitive     * >=100,000 COLONIES/mL ESCHERICHIA COLI  Blood Culture (routine x 2)     Status: None (Preliminary result)   Collection Time: 02/02/16 12:51 AM  Result Value Ref Range Status   Specimen Description BLOOD LEFT HAND  Final   Special Requests IN PEDIATRIC BOTTLE 4ML  Final   Culture NO GROWTH 2 DAYS  Final   Report Status PENDING  Incomplete  Blood Culture (routine x 2)     Status: None (Preliminary result)   Collection Time: 02/02/16  2:06 AM  Result Value Ref Range Status   Specimen Description BLOOD RIGHT ANTECUBITAL  Final   Special  Requests BOTTLES DRAWN AEROBIC AND ANAEROBIC 5CC EA  Final   Culture NO GROWTH 2 DAYS  Final   Report Status PENDING  Incomplete     Labs: BNP (last 3 results) No results for input(s): BNP in the last 8760 hours. Basic Metabolic Panel:  Recent Labs Lab 02/01/16 2131 02/02/16 0206 02/02/16 0527 02/03/16 0449 02/04/16 0415 02/05/16 0651  NA 136  --  139 138 139 136  K 3.0*  --  3.2* 3.3* 3.5 3.4*  CL 101  --  105 105 103 102  CO2 23  --  24 24 27 26   GLUCOSE 218*  --  145* 198* 190* 199*  BUN 6  --  <5* <5* 7 7  CREATININE 0.81  --  0.73 0.69 0.76 0.66  CALCIUM 9.1  --  7.7* 8.4*  9.3 8.7*  MG  --  1.6*  --   --  1.7  --    Liver Function Tests:  Recent Labs Lab 02/01/16 2131 02/04/16 0415  AST 27 30  ALT 27 36  ALKPHOS 111 136*  BILITOT 0.6 0.5  PROT 6.6 6.5  ALBUMIN 2.9* 2.8*   No results for input(s): LIPASE, AMYLASE in the last 168 hours. No results for input(s): AMMONIA in the last 168 hours. CBC:  Recent Labs Lab 02/01/16 2131 02/02/16 0527 02/04/16 0415  WBC 10.6* 10.3 8.6  NEUTROABS 7.6  --   --   HGB 14.2 12.9 13.7  HCT 42.3 40.0 40.8  MCV 81.7 83.0 80.6  PLT 292 258 321   Cardiac Enzymes: No results for input(s): CKTOTAL, CKMB, CKMBINDEX, TROPONINI in the last 168 hours. BNP: Invalid input(s): POCBNP CBG:  Recent Labs Lab 02/04/16 1222 02/04/16 1640 02/04/16 2121 02/05/16 0726 02/05/16 1133  GLUCAP 147* 262* 211* 187* 258*   D-Dimer No results for input(s): DDIMER in the last 72 hours. Hgb A1c No results for input(s): HGBA1C in the last 72 hours. Lipid Profile No results for input(s): CHOL, HDL, LDLCALC, TRIG, CHOLHDL, LDLDIRECT in the last 72 hours. Thyroid function studies No results for input(s): TSH, T4TOTAL, T3FREE, THYROIDAB in the last 72 hours.  Invalid input(s): FREET3 Anemia work up No results for input(s): VITAMINB12, FOLATE, FERRITIN, TIBC, IRON, RETICCTPCT in the last 72 hours. Urinalysis    Component Value  Date/Time   COLORURINE YELLOW 02/01/2016 2245   APPEARANCEUR CLOUDY (A) 02/01/2016 2245   LABSPEC >1.030 (H) 02/01/2016 2245   PHURINE 6.0 02/01/2016 2245   GLUCOSEU NEGATIVE 02/01/2016 2245   HGBUR LARGE (A) 02/01/2016 2245   BILIRUBINUR SMALL (A) 02/01/2016 2245   KETONESUR NEGATIVE 02/01/2016 2245   PROTEINUR 100 (A) 02/01/2016 2245   NITRITE POSITIVE (A) 02/01/2016 2245   LEUKOCYTESUR MODERATE (A) 02/01/2016 2245   Sepsis Labs Invalid input(s): PROCALCITONIN,  WBC,  LACTICIDVEN Microbiology Recent Results (from the past 240 hour(s))  Urine culture     Status: Abnormal   Collection Time: 02/01/16 10:45 PM  Result Value Ref Range Status   Specimen Description URINE, CLEAN CATCH  Final   Special Requests NONE  Final   Culture >=100,000 COLONIES/mL ESCHERICHIA COLI (A)  Final   Report Status 02/04/2016 FINAL  Final   Organism ID, Bacteria ESCHERICHIA COLI (A)  Final      Susceptibility   Escherichia coli - MIC*    AMPICILLIN <=2 SENSITIVE Sensitive     CEFAZOLIN <=4 SENSITIVE Sensitive     CEFTRIAXONE <=1 SENSITIVE Sensitive     CIPROFLOXACIN <=0.25 SENSITIVE Sensitive     GENTAMICIN <=1 SENSITIVE Sensitive     IMIPENEM <=0.25 SENSITIVE Sensitive     NITROFURANTOIN <=16 SENSITIVE Sensitive     TRIMETH/SULFA <=20 SENSITIVE Sensitive     AMPICILLIN/SULBACTAM <=2 SENSITIVE Sensitive     PIP/TAZO <=4 SENSITIVE Sensitive     Extended ESBL NEGATIVE Sensitive     * >=100,000 COLONIES/mL ESCHERICHIA COLI  Blood Culture (routine x 2)     Status: None (Preliminary result)   Collection Time: 02/02/16 12:51 AM  Result Value Ref Range Status   Specimen Description BLOOD LEFT HAND  Final   Special Requests IN PEDIATRIC BOTTLE  Final   Culture NO GROWTH 2 DAYS  Final   Report Status PENDING  Incomplete  Blood Culture (routine x 2)     Status: None (Preliminary result)   Collection Time:  02/02/16  2:06 AM  Result Value Ref Range Status   Specimen Description BLOOD RIGHT  ANTECUBITAL  Final   Special Requests BOTTLES DRAWN AEROBIC AND ANAEROBIC 5CC EA  Final   Culture NO GROWTH 2 DAYS  Final   Report Status PENDING  Incomplete     Time coordinating discharge: Over 30 minutes  SIGNED:   Clint Lipps, MD  [facility] 02/05/2016, 11:52 AM Pager   If 7PM-7AM, please contact night-coverage [email or web address] Password [email or web address]

## 2016-02-07 LAB — CULTURE, BLOOD (ROUTINE X 2)
CULTURE: NO GROWTH
Culture: NO GROWTH

## 2017-06-27 ENCOUNTER — Other Ambulatory Visit: Payer: Self-pay | Admitting: Internal Medicine

## 2017-06-27 DIAGNOSIS — Z87891 Personal history of nicotine dependence: Secondary | ICD-10-CM

## 2018-09-17 ENCOUNTER — Other Ambulatory Visit: Payer: Self-pay

## 2018-09-18 ENCOUNTER — Encounter: Payer: Self-pay | Admitting: Internal Medicine

## 2018-09-18 ENCOUNTER — Ambulatory Visit (INDEPENDENT_AMBULATORY_CARE_PROVIDER_SITE_OTHER): Payer: Medicare Other | Admitting: Internal Medicine

## 2018-09-18 VITALS — BP 110/68 | HR 78 | Temp 97.5°F | Ht 63.0 in | Wt 167.6 lb

## 2018-09-18 DIAGNOSIS — E1165 Type 2 diabetes mellitus with hyperglycemia: Secondary | ICD-10-CM | POA: Diagnosis not present

## 2018-09-18 DIAGNOSIS — R739 Hyperglycemia, unspecified: Secondary | ICD-10-CM

## 2018-09-18 DIAGNOSIS — E785 Hyperlipidemia, unspecified: Secondary | ICD-10-CM

## 2018-09-18 LAB — POCT GLYCOSYLATED HEMOGLOBIN (HGB A1C): Hemoglobin A1C: 9 % — AB (ref 4.0–5.6)

## 2018-09-18 LAB — GLUCOSE, POCT (MANUAL RESULT ENTRY): POC Glucose: 145 mg/dl — AB (ref 70–99)

## 2018-09-18 NOTE — Patient Instructions (Signed)
-   Continue Metformin 500 mg 1 tablet with Breakfast , if you can increase to 1 tablet with Supper as well that would be great. But if you develop vomiting or diarrhea then decrease it to 1 tbalet a day    - Check sugar before breakfast and bedtime , when you can    - Bring meter on next visit

## 2018-09-18 NOTE — Progress Notes (Signed)
Name: Allison Hughes  MRN/ DOB: [id number], 21-Dec-1950   Age/ Sex: 68 y.o., female    PCP: Aida Puffer, MD   Reason for Endocrinology Evaluation: Type 2 Diabetes Mellitus     Date of Initial Endocrinology Visit: 09/18/2018     PATIENT IDENTIFIER: Ms. Allison Hughes is a 68 y.o. female with a past medical history of HTN, T2DM and Dyslipidemia . The patient presented for initial endocrinology clinic visit on 09/18/2018 for consultative assistance with her diabetes management.    HPI: [name] was    Diagnosed with DM Years ago  Prior Medications tried/Intolerance: Was on medications in the past but was able to get off with lifestyle changes  Currently checking blood sugars  x /0 day Hypoglycemia episodes : no             Hemoglobin A1c has ranged from  in 8's% in 2019, peaking at 12.2% in 2020. Patient required assistance for hypoglycemia: no Patient has required hospitalization within the last 1 year from hyper or hypoglycemia:   In terms of diet, the patient  Eats 2-3 meals a day, snacks on occasions. Avoids sugar sweetened beverages.   Daughter with T1DM   HOME DIABETES REGIMEN: Metformin 500 mg 1 tablet daily    Statin: yes ACE-I/ARB: yes Prior Diabetic Education: Yes   METER DOWNLOAD SUMMARY: Does not have one    DIABETIC COMPLICATIONS: Microvascular complications:    Denies: CKD , neuropathy, retinopathy   Last eye exam: Completed 07/2018  Macrovascular complications:    Denies: CAD, PVD, CVA   PAST HISTORY: Past Medical History:  Past Medical History:  Diagnosis Date  . Diabetes mellitus 2006   T2DM  . Hyperlipidemia   . Hypertension   . Obesity (BMI 30-39.9)    Past Surgical History:  Past Surgical History:  Procedure Laterality Date  . CESAREAN SECTION    . ORIF ANKLE FRACTURE  05/11/2011   Procedure: OPEN REDUCTION INTERNAL FIXATION (ORIF) ANKLE FRACTURE;  Surgeon: Nadara Mustard, MD;  Location: [facility] OR;  Service: Orthopedics;  Laterality:  Right;  . TUBAL LIGATION        Social History:  reports that she has been smoking cigarettes. She has a 40.00 pack-year smoking history. She quit smokeless tobacco use about 7 years ago. She reports that she does not drink alcohol or use drugs. Family History:  Family History  Problem Relation Age of Onset  . Drug abuse Daughter   . Diabetes Daughter      HOME MEDICATIONS: Allergies as of 09/18/2018      Reactions   Iodine Swelling   Shrimp [shellfish Allergy] Swelling   Fresh shrimp      Medication List       Accurate as of September 18, 2018 11:10 AM. If you have any questions, ask your nurse or doctor.        ALPRAZolam 1 MG tablet Commonly known as: XANAX Take 0.5-1 mg by mouth 2 (two) times daily as needed. For anxiety and sleep.   atorvastatin 40 MG tablet Commonly known as: LIPITOR   cephALEXin 500 MG capsule Commonly known as: KEFLEX Take 1 capsule (500 mg total) by mouth every 12 (twelve) hours.   ibuprofen 200 MG tablet Commonly known as: ADVIL Take 200 mg by mouth daily as needed. For back pain.   lisinopril 20 MG tablet Commonly known as: ZESTRIL Take 20 mg by mouth daily.   metFORMIN 500 MG tablet Commonly known as: GLUCOPHAGE   Myrbetriq 50  MG Tb24 tablet Generic drug: mirabegron ER   OMEGA 3 500 PO Take by mouth.   PARoxetine 20 MG tablet Commonly known as: PAXIL Take 10 mg by mouth daily.   vitamin B-12 1000 MCG tablet Commonly known as: CYANOCOBALAMIN Take 1,000 mcg by mouth daily.   Vitamin D (Ergocalciferol) 1.25 MG (50000 UT) Caps capsule Commonly known as: DRISDOL Take 50,000 Units by mouth every 7 (seven) days.        ALLERGIES: Allergies  Allergen Reactions  . Iodine Swelling  . Shrimp [Shellfish Allergy] Swelling    Fresh shrimp     REVIEW OF SYSTEMS: A comprehensive ROS was conducted with the patient and is negative except as per HPI and below:  Review of Systems  Constitutional: Negative for chills and fever.   HENT: Negative for congestion and sore throat.   Respiratory: Negative for cough and shortness of breath.   Cardiovascular: Negative for chest pain and palpitations.  Gastrointestinal: Negative for nausea.  Genitourinary: Negative for frequency.  Skin: Negative.   Neurological: Negative for tingling and tremors.  Endo/Heme/Allergies: Negative for polydipsia.  Psychiatric/Behavioral: Negative for depression. The patient is not nervous/anxious.       OBJECTIVE:   VITAL SIGNS: BP 110/68 (BP Location: Left Arm, Patient Position: Sitting, Cuff Size: Normal)   Pulse 78   Temp (!) 97.5 F (36.4 C)   Ht 5\' 3"  (1.6 m)   Wt 167 lb 9.6 oz (76 kg)   SpO2 98%   BMI 29.69 kg/m    PHYSICAL EXAM:  General: Pt appears well and is in NAD  Hydration: Well-hydrated with moist mucous membranes and good skin turgor  HEENT: Head: Unremarkable with dentures. Oropharynx clear without exudate.  Eyes: External eye exam normal without stare, lid lag or exophthalmos.  EOM intact.    Neck: General: Supple without adenopathy or carotid bruits. Thyroid: Thyroid size normal.  No goiter or nodules appreciated. No thyroid bruit.  Lungs: Clear with good BS bilat with no rales, rhonchi, or wheezes  Heart: RRR with normal S1 and S2 and no gallops; no murmurs; no rub  Abdomen: Normoactive bowel sounds, soft, nontender, without masses or organomegaly palpable  Extremities:  Lower extremities - No pretibial edema. No lesions.  Skin: Normal texture and temperature to palpation. No rash noted. No Acanthosis nigricans/skin tags. No lipohypertrophy.  Neuro: MS is good with appropriate affect, pt is alert and Ox3    DM foot exam: 09/18/2018  The skin of the feet is intact without sores or ulcerations. The pedal pulses are 2+ on right and 2+ on left. The sensation is intact to a screening 5.07, 10 gram monofilament bilaterally   DATA REVIEWED: 08/04/18  A1c 12.2% K 4.5 mEq/L Bun/Cr 19/0.79 Ca 10.1  GFR 77/90   T.Chol 235  TG 235 mg/dL  LDL 161151  HDL 37   In-Office 145 mg/dL   ASSESSMENT / PLAN / RECOMMENDATIONS:   1) Type 2 Diabetes Mellitus, Poorly controlled, With complications - Most recent A1c of 9.0 %. Goal A1c < 7.0 %.  Down from 12.2%   Plan: GENERAL:  Poorly controlled diabetes due to dietary indiscretions, the patient had made drastic dietary changes since 07/2018 and has avoided all sugar-sweetened beverages and trying to limit CHO intake.  I have discussed with the patient the pathophysiology of diabetes. We went over the natural progression of the disease. We talked about both insulin resistance and insulin deficiency. We stressed the importance of lifestyle changes including diet and exercise.  I explained the complications associated with diabetes including retinopathy, nephropathy, neuropathy as well as increased risk of cardiovascular disease. We went over the benefit seen with glycemic control.   I explained to the patient that diabetic patients are at higher than normal risk for amputations.   Pt is reluctant to add antiglycemic agents due to weight gain. We discussed new options for oral glycemic agents that would also help with weight loss, but given that her A1c has trended from 12.2% to 9.0% in less then 2 months, I believe we can hold off on any add on therapy at this time.   She was provided with a meter today and advised to check glucose and bring the meter on next visit   She is also going to try and increase Metformin from 1 to 2 tablets daily , initially she was unable to tolerate the BID dosing  In-Office BG 145 mg /dL ( fasting) which is acceptable   MEDICATIONS:  Increase Metformin 500 mg BID with meals   EDUCATION / INSTRUCTIONS:  BG monitoring instructions: Patient is instructed to check her blood sugars 2 times a day, fasting and bedtime ( she will try ).  Call [facility] if: BG persistently < 70 or > 300. . I reviewed the Rule of 15  for the treatment of hypoglycemia in detail with the patient. Literature supplied.   2) Diabetic complications:   Eye: Does not have known diabetic retinopathy.   Neuro/ Feet: Does not have known diabetic peripheral neuropathy.  Renal: Patient does not have known baseline CKD. She is on an ACEI/ARB at present.  3) Lipids: Patient's LDL was elevated at 151 mg/dL. She is currently on lipitor 40 mg daily.    4) Hypertension: She is  at goal of < 140/90 mmHg.   F/u in 2 months     Signed electronically by: Mack Guise, MD  [facility]  [facility] [address] [place], [place] [address] Phone: [phone] FAX: [phone]   CC: Tamsen Roers, [place] [address] [place] [address] Phone: [phone]  Fax: [phone]    Return to Endocrinology clinic as below: No future appointments.

## 2018-11-17 ENCOUNTER — Other Ambulatory Visit: Payer: Self-pay

## 2018-11-19 ENCOUNTER — Encounter: Payer: Self-pay | Admitting: Internal Medicine

## 2018-11-19 ENCOUNTER — Ambulatory Visit (INDEPENDENT_AMBULATORY_CARE_PROVIDER_SITE_OTHER): Payer: Medicare Other | Admitting: Internal Medicine

## 2018-11-19 ENCOUNTER — Other Ambulatory Visit: Payer: Self-pay

## 2018-11-19 VITALS — BP 116/64 | HR 80 | Temp 97.8°F | Ht 63.0 in | Wt 161.8 lb

## 2018-11-19 DIAGNOSIS — E119 Type 2 diabetes mellitus without complications: Secondary | ICD-10-CM | POA: Diagnosis not present

## 2018-11-19 LAB — POCT GLYCOSYLATED HEMOGLOBIN (HGB A1C): Hemoglobin A1C: 6.4 % — AB (ref 4.0–5.6)

## 2018-11-19 MED ORDER — METFORMIN HCL 500 MG PO TABS: 500.0000 mg | ORAL_TABLET | Freq: Every day | ORAL | 3 refills | Status: DC

## 2018-11-19 NOTE — Progress Notes (Signed)
Name: Allison Hughes  Age/ Sex: 68 y.o., female   MRN/ DOB: [id number], 1950/03/22     PCP: Jilda Panda, MD   Reason for Endocrinology Evaluation: Type 2 Diabetes Mellitus  Initial Endocrine Consultative Visit: 09/18/2018    PATIENT IDENTIFIER: Allison Hughes is a 68 y.o. female with a past medical history of HTN, T2DM and Dyslipidemia  . The patient has followed with Endocrinology clinic since 09/18/2018 for consultative assistance with management of her diabetes.  DIABETIC HISTORY:  Allison Hughes was diagnosed with DM many years ago. She was initially on oral glycemic agents but per patient she was eventually able to get off all meds with the proper lifestyle changes until her presentation to her PCP in 2020 when she was found to have an A1c of 12.2%. Marland Kitchen Her hemoglobin A1c has ranged from 8's% in 2019, peaking at 12.2% in 2020.   Daughter with T1DM.  ON her initial visit to our clinic her A1c was 9.0%   And she was on Metformin 500 mg daily.  SUBJECTIVE:   During the last visit (09/18/2018): A1c 9.0% . We increased Metformin   Today (11/19/2018): Allison Hughes is here for a 2 month follow up on diabetes management.  She checks her blood sugars every once in a while through her daughter who has T1DM. The patient has not had hypoglycemic episodes since the last clinic visit. Otherwise, the patient has not required any recent emergency interventions for hypoglycemia and has not had recent hospitalizations secondary to hyper or hypoglycemic episodes.    ROS: As per HPI and as detailed below: Review of Systems  Constitutional: Negative for chills and fever.  Respiratory: Negative for cough and wheezing.   Cardiovascular: Negative for chest pain and palpitations.  Gastrointestinal: Negative for diarrhea and nausea.      HOME DIABETES REGIMEN:  Metformin 500 mg BID - takes it once daily    METER DOWNLOAD SUMMARY: Did not bring     HISTORY:  Past Medical History:  Past Medical  History:  Diagnosis Date  . Diabetes mellitus 2006   T2DM  . Hyperlipidemia   . Hypertension   . Obesity (BMI 30-39.9)    Past Surgical History:  Past Surgical History:  Procedure Laterality Date  . CESAREAN SECTION    . ORIF ANKLE FRACTURE  05/11/2011   Procedure: OPEN REDUCTION INTERNAL FIXATION (ORIF) ANKLE FRACTURE;  Surgeon: Newt Minion, MD;  Location: [facility];  Service: Orthopedics;  Laterality: Right;  . TUBAL LIGATION      Social History:  reports that she has been smoking cigarettes. She has a 40.00 pack-year smoking history. She quit smokeless tobacco use about 7 years ago. She reports that she does not drink alcohol or use drugs. Family History:  Family History  Problem Relation Age of Onset  . Drug abuse Daughter   . Diabetes Daughter      HOME MEDICATIONS: Allergies as of 11/19/2018      Reactions   Iodine Swelling   Shrimp [shellfish Allergy] Swelling   Fresh shrimp      Medication List       Accurate as of November 19, 2018 10:17 AM. If you have any questions, ask your nurse or doctor.        STOP taking these medications   cephALEXin 500 MG capsule Commonly known as: KEFLEX Stopped by: Dorita Sciara, MD   ibuprofen 200 MG tablet Commonly known as: ADVIL Stopped by: Dorita Sciara, MD  PARoxetine 20 MG tablet Commonly known as: PAXIL Stopped by: Scarlette Shorts, MD     TAKE these medications   ALPRAZolam 1 MG tablet Commonly known as: XANAX Take 0.5-1 mg by mouth 2 (two) times daily as needed. For anxiety and sleep.   atorvastatin 40 MG tablet Commonly known as: LIPITOR   lisinopril 20 MG tablet Commonly known as: ZESTRIL Take 20 mg by mouth daily.   metFORMIN 500 MG tablet Commonly known as: GLUCOPHAGE   Myrbetriq 50 MG Tb24 tablet Generic drug: mirabegron ER   OMEGA 3 500 PO Take by mouth.   vitamin B-12 1000 MCG tablet Commonly known as: CYANOCOBALAMIN Take 1,000 mcg by mouth daily.   Vitamin D  (Ergocalciferol) 1.25 MG (50000 UT) Caps capsule Commonly known as: DRISDOL Take 50,000 Units by mouth every 7 (seven) days.        OBJECTIVE:   Vital Signs: BP 116/64 (BP Location: Left Arm, Patient Position: Sitting, Cuff Size: Normal)   Pulse 80   Temp 97.8 F (36.6 C) (Oral)   Ht 5\' 3"  (1.6 m)   Wt 161 lb 12.8 oz (73.4 kg)   SpO2 95%   BMI 28.66 kg/m   Wt Readings from Last 3 Encounters:  11/19/18 161 lb 12.8 oz (73.4 kg)  09/18/18 167 lb 9.6 oz (76 kg)  02/02/16 185 lb 12.8 oz (84.3 kg)     Exam: General: Pt appears well and is in NAD  Lungs: Clear with good BS bilat with no rales, rhonchi, or wheezes  Heart: RRR with normal S1 and S2 and no gallops; no murmurs; no rub  Abdomen: Normoactive bowel sounds, soft, nontender, without masses or organomegaly palpable  Extremities: No pretibial edema.   Skin: Normal texture and temperature to palpation.   Neuro: MS is good with appropriate affect, pt is alert and Ox3       DM foot exam: 09/18/2018  The skin of the feet is intact without sores or ulcerations. The pedal pulses are 2+ on right and 2+ on left. The sensation is intact to a screening 5.07, 10 gram monofilament bilaterally     DATA REVIEWED:  Lab Results  Component Value Date   HGBA1C 9.0 (A) 09/18/2018   Lab Results  Component Value Date   CREATININE 0.66 02/05/2016   DATA REVIEWED: 08/04/18  A1c 12.2% K 4.5 mEq/L Bun/Cr 19/0.79 Ca 10.1  GFR 77/90  T.Chol 235  TG 235 mg/dL  LDL 10-21-1992  HDL 37 ASSESSMENT / PLAN / RECOMMENDATIONS:    1) Type 2 Diabetes Mellitus, Optimally  Controlled, Without complications - Most recent A1c of 6.4 %. Goal A1c < 7.0 %.   - She has done an amazing job with lifestyle changes and has gradually reduced her A1c from 12.2% to now at 6.4%. She did not increase her metformin to twice a day as advised on last visit but as long as her glucose remains at this level, there no need to increase metformin at this time.   - She was encouraged to continue with lifestyle changes    MEDICATIONS:  Metformin 500 mg daily   EDUCATION / INSTRUCTIONS:  BG monitoring instructions: Patient is instructed to check her blood sugars 2-3 times a week  Call [facility] if: BG persistently < 70 or > 300. . I reviewed the Rule of 15 for the treatment of hypoglycemia in detail with the patient. Literature supplied.     F/U in 4 months    Signed electronically  by: Lyndle HerrlichAbby Jaralla Makaylyn Sinyard, MD  [facility]  [facility] [address] [place], [place] [address] Phone: [phone] FAX: [phone]   CC: Ralene OkMoreira, Roy, MD [address] [place] [address] Phone: [phone]  Fax: [phone]  Return to Endocrinology clinic as below: No future appointments.

## 2018-11-19 NOTE — Patient Instructions (Signed)
-   You have done an AMAZING job bringing your sugars down  - Continue Metformin 1  Tablet daily

## 2018-11-20 DIAGNOSIS — E119 Type 2 diabetes mellitus without complications: Secondary | ICD-10-CM | POA: Insufficient documentation

## 2019-02-09 ENCOUNTER — Ambulatory Visit: Payer: Medicare Other

## 2019-02-18 ENCOUNTER — Ambulatory Visit: Payer: Medicare Other | Attending: Internal Medicine

## 2019-02-18 DIAGNOSIS — Z23 Encounter for immunization: Secondary | ICD-10-CM | POA: Insufficient documentation

## 2019-02-18 NOTE — Progress Notes (Signed)
   Covid-19 Vaccination Clinic  Name:  Allison Hughes    MRN: [id number] DOB: 02/23/1950  02/18/2019  Ms. Allison Hughes was observed post Covid-19 immunization for 15 minutes without incidence. She was provided with Vaccine Information Sheet and instruction to access the V-Safe system.   Ms. Allison Hughes was instructed to call 911 with any severe reactions post vaccine: Marland Kitchen Difficulty breathing  . Swelling of your face and throat  . A fast heartbeat  . A bad rash all over your body  . Dizziness and weakness    Immunizations Administered    Name Date Dose VIS Date Route   Pfizer COVID-19 Vaccine 02/18/2019  3:46 PM 0.3 mL 12/25/2018 Intramuscular   Manufacturer: [employer], [employer]   Lot: [id number]   NDC: [address]-7

## 2019-02-23 ENCOUNTER — Ambulatory Visit: Payer: Medicare Other | Admitting: Internal Medicine

## 2019-03-15 ENCOUNTER — Ambulatory Visit: Payer: Medicare Other | Attending: Internal Medicine

## 2019-03-15 DIAGNOSIS — Z23 Encounter for immunization: Secondary | ICD-10-CM

## 2019-03-15 NOTE — Progress Notes (Signed)
   Covid-19 Vaccination Clinic  Name:  Allison Hughes    MRN: [id number] DOB: 02/11/1950  03/15/2019  [name] was observed post Covid-19 immunization for 15 minutes without incidence. She was provided with Vaccine Information Sheet and instruction to access the V-Safe system.   [name] was instructed to call 911 with any severe reactions post vaccine: Marland Kitchen Difficulty breathing  . Swelling of your face and throat  . A fast heartbeat  . A bad rash all over your body  . Dizziness and weakness    Immunizations Administered    Name Date Dose VIS Date Route   Pfizer COVID-19 Vaccine 03/15/2019  5:18 PM 0.3 mL 12/25/2018 Intramuscular   Manufacturer: [employer], [employer]   Lot: [id number]   NDC: [address]-7

## 2019-07-21 ENCOUNTER — Ambulatory Visit: Payer: Medicare Other | Admitting: Podiatry

## 2019-09-14 ENCOUNTER — Other Ambulatory Visit: Payer: Self-pay | Admitting: Internal Medicine

## 2019-09-14 DIAGNOSIS — Z1231 Encounter for screening mammogram for malignant neoplasm of breast: Secondary | ICD-10-CM

## 2020-09-06 ENCOUNTER — Emergency Department (HOSPITAL_COMMUNITY): Payer: Medicare Other

## 2020-09-06 ENCOUNTER — Inpatient Hospital Stay (HOSPITAL_COMMUNITY)
Admission: EM | Admit: 2020-09-06 | Discharge: 2020-09-08 | DRG: 981 | Disposition: A | Discharge status: Home / Self Care | Payer: Medicare Other | Attending: Internal Medicine | Admitting: Internal Medicine

## 2020-09-06 ENCOUNTER — Inpatient Hospital Stay (HOSPITAL_COMMUNITY): Payer: Medicare Other

## 2020-09-06 ENCOUNTER — Encounter (HOSPITAL_COMMUNITY): Payer: Self-pay | Admitting: Internal Medicine

## 2020-09-06 DIAGNOSIS — G9340 Encephalopathy, unspecified: Secondary | ICD-10-CM

## 2020-09-06 DIAGNOSIS — E876 Hypokalemia: Secondary | ICD-10-CM | POA: Diagnosis present

## 2020-09-06 DIAGNOSIS — J189 Pneumonia, unspecified organism: Secondary | ICD-10-CM | POA: Diagnosis present

## 2020-09-06 DIAGNOSIS — I10 Essential (primary) hypertension: Secondary | ICD-10-CM | POA: Diagnosis present

## 2020-09-06 DIAGNOSIS — I6389 Other cerebral infarction: Secondary | ICD-10-CM | POA: Diagnosis not present

## 2020-09-06 DIAGNOSIS — E669 Obesity, unspecified: Secondary | ICD-10-CM | POA: Diagnosis present

## 2020-09-06 DIAGNOSIS — I63412 Cerebral infarction due to embolism of left middle cerebral artery: Secondary | ICD-10-CM

## 2020-09-06 DIAGNOSIS — E785 Hyperlipidemia, unspecified: Secondary | ICD-10-CM | POA: Diagnosis present

## 2020-09-06 DIAGNOSIS — R4701 Aphasia: Secondary | ICD-10-CM | POA: Diagnosis present

## 2020-09-06 DIAGNOSIS — R251 Tremor, unspecified: Secondary | ICD-10-CM | POA: Diagnosis present

## 2020-09-06 DIAGNOSIS — Z79899 Other long term (current) drug therapy: Secondary | ICD-10-CM

## 2020-09-06 DIAGNOSIS — I639 Cerebral infarction, unspecified: Secondary | ICD-10-CM | POA: Diagnosis present

## 2020-09-06 DIAGNOSIS — Z20822 Contact with and (suspected) exposure to covid-19: Secondary | ICD-10-CM | POA: Diagnosis present

## 2020-09-06 DIAGNOSIS — F419 Anxiety disorder, unspecified: Secondary | ICD-10-CM | POA: Diagnosis present

## 2020-09-06 DIAGNOSIS — J690 Pneumonitis due to inhalation of food and vomit: Secondary | ICD-10-CM | POA: Diagnosis present

## 2020-09-06 DIAGNOSIS — Z833 Family history of diabetes mellitus: Secondary | ICD-10-CM

## 2020-09-06 DIAGNOSIS — I4891 Unspecified atrial fibrillation: Secondary | ICD-10-CM | POA: Diagnosis present

## 2020-09-06 DIAGNOSIS — Z7984 Long term (current) use of oral hypoglycemic drugs: Secondary | ICD-10-CM | POA: Diagnosis not present

## 2020-09-06 DIAGNOSIS — Z8673 Personal history of transient ischemic attack (TIA), and cerebral infarction without residual deficits: Secondary | ICD-10-CM | POA: Diagnosis not present

## 2020-09-06 DIAGNOSIS — R29704 NIHSS score 4: Secondary | ICD-10-CM | POA: Diagnosis present

## 2020-09-06 DIAGNOSIS — Z91013 Allergy to seafood: Secondary | ICD-10-CM | POA: Diagnosis not present

## 2020-09-06 DIAGNOSIS — R451 Restlessness and agitation: Secondary | ICD-10-CM | POA: Diagnosis not present

## 2020-09-06 DIAGNOSIS — F17210 Nicotine dependence, cigarettes, uncomplicated: Secondary | ICD-10-CM | POA: Diagnosis present

## 2020-09-06 DIAGNOSIS — Z6826 Body mass index (BMI) 26.0-26.9, adult: Secondary | ICD-10-CM | POA: Diagnosis not present

## 2020-09-06 DIAGNOSIS — Z91041 Radiographic dye allergy status: Secondary | ICD-10-CM

## 2020-09-06 DIAGNOSIS — F17200 Nicotine dependence, unspecified, uncomplicated: Secondary | ICD-10-CM | POA: Diagnosis not present

## 2020-09-06 DIAGNOSIS — E119 Type 2 diabetes mellitus without complications: Secondary | ICD-10-CM | POA: Diagnosis present

## 2020-09-06 HISTORY — DX: Pneumonia, unspecified organism: J18.9

## 2020-09-06 LAB — HIV ANTIBODY (ROUTINE TESTING W REFLEX): HIV Screen 4th Generation wRfx: NONREACTIVE

## 2020-09-06 LAB — URINALYSIS, ROUTINE W REFLEX MICROSCOPIC
Bilirubin Urine: NEGATIVE
Glucose, UA: NEGATIVE mg/dL
Ketones, ur: 5 mg/dL — AB
Nitrite: POSITIVE — AB
Protein, ur: NEGATIVE mg/dL
Specific Gravity, Urine: 1.013 (ref 1.005–1.030)
WBC, UA: >50 WBC/hpf — ABNORMAL HIGH (ref 0–5)
pH: 6 (ref 5.0–8.0)

## 2020-09-06 LAB — CBC WITH DIFFERENTIAL/PLATELET
Abs Immature Granulocytes: 0.06 10*3/uL (ref 0.00–0.07)
Basophils Absolute: 0.1 10*3/uL (ref 0.0–0.1)
Basophils Relative: 0 %
Eosinophils Absolute: 0 10*3/uL (ref 0.0–0.5)
Eosinophils Relative: 0 %
HCT: 44.4 % (ref 36.0–46.0)
Hemoglobin: 14.4 g/dL (ref 12.0–15.0)
Immature Granulocytes: 0 %
Lymphocytes Relative: 12 %
Lymphs Abs: 1.9 10*3/uL (ref 0.7–4.0)
MCH: 26.4 pg (ref 26.0–34.0)
MCHC: 32.4 g/dL (ref 30.0–36.0)
MCV: 81.5 fL (ref 80.0–100.0)
Monocytes Absolute: 0.5 10*3/uL (ref 0.1–1.0)
Monocytes Relative: 4 %
Neutro Abs: 12.8 10*3/uL — ABNORMAL HIGH (ref 1.7–7.7)
Neutrophils Relative %: 84 %
Platelets: 403 10*3/uL — ABNORMAL HIGH (ref 150–400)
RBC: 5.45 MIL/uL — ABNORMAL HIGH (ref 3.87–5.11)
RDW: 13.9 % (ref 11.5–15.5)
WBC: 15.4 10*3/uL — ABNORMAL HIGH (ref 4.0–10.5)
nRBC: 0 % (ref 0.0–0.2)

## 2020-09-06 LAB — RAPID URINE DRUG SCREEN, HOSP PERFORMED
Amphetamines: NOT DETECTED
Barbiturates: NOT DETECTED
Benzodiazepines: POSITIVE — AB
Cocaine: NOT DETECTED
Opiates: NOT DETECTED
Tetrahydrocannabinol: NOT DETECTED

## 2020-09-06 LAB — COMPREHENSIVE METABOLIC PANEL
ALT: 17 U/L (ref 0–44)
AST: 16 U/L (ref 15–41)
Albumin: 3.4 g/dL — ABNORMAL LOW (ref 3.5–5.0)
Alkaline Phosphatase: 97 U/L (ref 38–126)
Anion gap: 9 (ref 5–15)
BUN: 12 mg/dL (ref 8–23)
CO2: 25 mmol/L (ref 22–32)
Calcium: 9.3 mg/dL (ref 8.9–10.3)
Chloride: 103 mmol/L (ref 98–111)
Creatinine, Ser: 0.68 mg/dL (ref 0.44–1.00)
GFR, Estimated: >60 mL/min (ref >60)
Glucose, Bld: 149 mg/dL — ABNORMAL HIGH (ref 70–99)
Potassium: 3.9 mmol/L (ref 3.5–5.1)
Sodium: 137 mmol/L (ref 135–145)
Total Bilirubin: 0.8 mg/dL (ref 0.3–1.2)
Total Protein: 7.2 g/dL (ref 6.5–8.1)

## 2020-09-06 LAB — RESP PANEL BY RT-PCR (FLU A&B, COVID) ARPGX2
Influenza A by PCR: NEGATIVE
Influenza B by PCR: NEGATIVE
SARS Coronavirus 2 by RT PCR: NEGATIVE

## 2020-09-06 LAB — HEMOGLOBIN A1C
Hgb A1c MFr Bld: 8.6 % — ABNORMAL HIGH (ref 4.8–5.6)
Mean Plasma Glucose: 200.12 mg/dL

## 2020-09-06 LAB — CBG MONITORING, ED: Glucose-Capillary: 136 mg/dL — ABNORMAL HIGH (ref 70–99)

## 2020-09-06 LAB — LACTIC ACID, PLASMA: Lactic Acid, Venous: 1.2 mmol/L (ref 0.5–1.9)

## 2020-09-06 LAB — GLUCOSE, CAPILLARY
Glucose-Capillary: 130 mg/dL — ABNORMAL HIGH (ref 70–99)
Glucose-Capillary: 116 mg/dL — ABNORMAL HIGH (ref 70–99)
Glucose-Capillary: 105 mg/dL — ABNORMAL HIGH (ref 70–99)

## 2020-09-06 LAB — ETHANOL: Alcohol, Ethyl (B): <10 mg/dL (ref <10)

## 2020-09-06 LAB — AMMONIA: Ammonia: 14 umol/L (ref 9–35)

## 2020-09-06 MED ORDER — HALOPERIDOL LACTATE 5 MG/ML IJ SOLN
5.0000 mg | Freq: Once | INTRAMUSCULAR | Status: AC
  Administered 2020-09-06: 5 mg via INTRAVENOUS
  Filled 2020-09-06: qty 1

## 2020-09-06 MED ORDER — SODIUM CHLORIDE 0.9 % IV SOLN: 250.0000 mL | INTRAVENOUS | Status: DC | PRN

## 2020-09-06 MED ORDER — AZITHROMYCIN 500 MG IV SOLR
500.0000 mg | INTRAVENOUS | Status: DC
  Administered 2020-09-06 – 2020-09-07 (×2): 500 mg via INTRAVENOUS
  Filled 2020-09-06 (×2): qty 500

## 2020-09-06 MED ORDER — ONDANSETRON HCL 4 MG PO TABS: 4.0000 mg | ORAL_TABLET | Freq: Four times a day (QID) | ORAL | Status: DC | PRN

## 2020-09-06 MED ORDER — CEFTRIAXONE SODIUM 2 G IJ SOLR: 2.0000 g | INTRAMUSCULAR | Status: DC

## 2020-09-06 MED ORDER — SODIUM CHLORIDE 0.9 % IV SOLN
2.0000 g | INTRAVENOUS | Status: DC
  Administered 2020-09-07: 2 g via INTRAVENOUS
  Filled 2020-09-06: qty 20

## 2020-09-06 MED ORDER — AZITHROMYCIN 250 MG PO TABS: 500.0000 mg | ORAL_TABLET | Freq: Once | ORAL | Status: DC

## 2020-09-06 MED ORDER — ACETAMINOPHEN 325 MG PO TABS: 650.0000 mg | ORAL_TABLET | Freq: Four times a day (QID) | ORAL | Status: DC | PRN

## 2020-09-06 MED ORDER — SODIUM CHLORIDE 0.9 % IV SOLN
1.0000 g | Freq: Once | INTRAVENOUS | Status: AC
  Administered 2020-09-06: 1 g via INTRAVENOUS
  Filled 2020-09-06: qty 10

## 2020-09-06 MED ORDER — ONDANSETRON HCL 4 MG/2ML IJ SOLN: 4.0000 mg | Freq: Four times a day (QID) | INTRAMUSCULAR | Status: DC | PRN

## 2020-09-06 MED ORDER — ASPIRIN 325 MG PO TABS
325.0000 mg | ORAL_TABLET | Freq: Every day | ORAL | Status: DC
  Administered 2020-09-07: 325 mg via ORAL
  Filled 2020-09-06: qty 1

## 2020-09-06 MED ORDER — SODIUM CHLORIDE 0.9% FLUSH: 3.0000 mL | INTRAVENOUS | Status: DC | PRN

## 2020-09-06 MED ORDER — ATORVASTATIN CALCIUM 40 MG PO TABS
40.0000 mg | ORAL_TABLET | Freq: Every day | ORAL | Status: DC
  Administered 2020-09-07: 40 mg via ORAL
  Filled 2020-09-06: qty 1

## 2020-09-06 MED ORDER — VITAMIN B-12 1000 MCG PO TABS
1000.0000 ug | ORAL_TABLET | Freq: Every day | ORAL | Status: DC
  Administered 2020-09-07 – 2020-09-08 (×2): 1000 ug via ORAL
  Filled 2020-09-06 (×2): qty 1

## 2020-09-06 MED ORDER — MIRABEGRON ER 25 MG PO TB24
25.0000 mg | ORAL_TABLET | Freq: Every day | ORAL | Status: DC
  Administered 2020-09-07 – 2020-09-08 (×2): 25 mg via ORAL
  Filled 2020-09-06 (×2): qty 1

## 2020-09-06 MED ORDER — ACETAMINOPHEN 650 MG RE SUPP: 650.0000 mg | Freq: Four times a day (QID) | RECTAL | Status: DC | PRN

## 2020-09-06 MED ORDER — STROKE: EARLY STAGES OF RECOVERY BOOK
Freq: Once | Status: DC
Start: 2020-09-06 — End: 2020-09-08

## 2020-09-06 MED ORDER — ASPIRIN 300 MG RE SUPP
300.0000 mg | Freq: Every day | RECTAL | Status: DC
  Filled 2020-09-06 (×3): qty 1

## 2020-09-06 MED ORDER — SODIUM CHLORIDE 0.9% FLUSH
3.0000 mL | Freq: Two times a day (BID) | INTRAVENOUS | Status: DC
  Administered 2020-09-06 – 2020-09-08 (×4): 3 mL via INTRAVENOUS

## 2020-09-06 MED ORDER — INSULIN ASPART 100 UNIT/ML IJ SOLN
0.0000 [IU] | INTRAMUSCULAR | Status: DC
  Administered 2020-09-07: 2 [IU] via SUBCUTANEOUS
  Administered 2020-09-07: 3 [IU] via SUBCUTANEOUS
  Administered 2020-09-07: 2 [IU] via SUBCUTANEOUS
  Administered 2020-09-08: 3 [IU] via SUBCUTANEOUS

## 2020-09-06 NOTE — ED Provider Notes (Signed)
[facility] Provider [facility]   CSN: [id number] Arrival date & time: 09/06/20  1158     History Chief Complaint  Patient presents with   Altered Mental Status    Allison Hughes is a 70 y.o. female.  The history is provided by the EMS personnel, medical records and the patient. No language interpreter was used.  Altered Mental Status  70 year old female seen in history of hypertension, diabetes, hyperlipidemia, obesity, brought here via EMS from home for evaluation of altered mental status.  Per EMS, neighbor reported that patient was complaining of headache and was expressing increased confusion with last known normal yesterday at 12 PM.  Neighbor came by to check up on her today and patient continued to be altered and EMS was contacted. EMS noted the patient was ambulating to the ambulance with a steady gait and reported no evidence of trauma were noted.  Patient is not on blood thinner medication.  Level 5 caveat is due to altered mental status.  No history of stroke.   Past Medical History:  Diagnosis Date   Diabetes mellitus 2006   T2DM   Hyperlipidemia    Hypertension    Obesity (BMI 30-39.9)     Patient Active Problem List   Diagnosis Date Noted   Type 2 diabetes mellitus without complication, without long-term current use of insulin (HCC) 11/20/2018   Type 2 diabetes mellitus with hyperglycemia, without long-term current use of insulin (HCC) 09/18/2018   Sepsis (HCC) 02/02/2016   Acute pyelonephritis 02/02/2016   Hypokalemia 02/02/2016   Anxiety 02/02/2016   Essential hypertension 02/02/2016    Past Surgical History:  Procedure Laterality Date   CESAREAN SECTION     ORIF ANKLE FRACTURE  05/11/2011   Procedure: OPEN REDUCTION INTERNAL FIXATION (ORIF) ANKLE FRACTURE;  Surgeon: Nadara MustardMarcus V Duda, MD;  Location: [facility] OR;  Service: Orthopedics;  Laterality: Right;   TUBAL LIGATION       OB History   No obstetric history on file.      Family History  Problem Relation Age of Onset   Drug abuse Daughter    Diabetes Daughter     Social History   Tobacco Use   Smoking status: Every Day    Packs/day: 1.00    Years: 40.00    Pack years: 40.00    Types: Cigarettes   Smokeless tobacco: Former    Quit date: 05/11/2011  Substance Use Topics   Alcohol use: No   Drug use: No    Home Medications Prior to Admission medications   Medication Sig Start Date End Date Taking? Authorizing Provider  ALPRAZolam Prudy Feeler(XANAX) 1 MG tablet Take 0.5-1 mg by mouth 2 (two) times daily as needed. For anxiety and sleep.    [provider]  atorvastatin (LIPITOR) 40 MG tablet  08/06/18   [provider]  lisinopril (PRINIVIL,ZESTRIL) 20 MG tablet Take 20 mg by mouth daily.    [provider]  metFORMIN (GLUCOPHAGE) 500 MG tablet Take 1 tablet (500 mg total) by mouth daily with breakfast. 11/19/18   Shamleffer, Konrad DoloresIbtehal Jaralla, MD  MYRBETRIQ 50 MG TB24 tablet  08/31/18   [provider]  Omega-3 Fatty Acids (OMEGA 3 500 PO) Take by mouth.    [provider]  vitamin B-12 (CYANOCOBALAMIN) 1000 MCG tablet Take 1,000 mcg by mouth daily.      [provider]  Vitamin D, Ergocalciferol, (DRISDOL) 50000 UNITS CAPS Take 50,000 Units by mouth every 7 (seven) days.  [provider]    Allergies    Iodine and Shrimp [shellfish allergy]  Review of Systems   Review of Systems  Unable to perform ROS: Mental status change   Physical Exam Updated Vital Signs There were no vitals taken for this visit.  Physical Exam Vitals and nursing note reviewed.  Constitutional:      General: She is not in acute distress.    Appearance: She is well-developed. She is obese.  HENT:     Head: Normocephalic and atraumatic.     Mouth/Throat:     Mouth: Mucous membranes are moist.  Eyes:     Extraocular Movements: Extraocular movements intact.     Conjunctiva/sclera: Conjunctivae normal.      Pupils: Pupils are equal, round, and reactive to light.  Cardiovascular:     Rate and Rhythm: Normal rate and regular rhythm.  Pulmonary:     Effort: Pulmonary effort is normal.     Breath sounds: No rhonchi.  Abdominal:     Palpations: Abdomen is soft.     Tenderness: There is no abdominal tenderness.  Genitourinary:    Comments: Chaperone present during exam.  No significant skin breakdown to her decubitus.  Normal rectal tone.  Rectal temperature of 99.6 Musculoskeletal:     Cervical back: Neck supple. No rigidity.     Comments: Poor effort but equal strength to all 4 extremities  Skin:    Findings: No rash.  Neurological:     Mental Status: She is alert.     Comments: Neurologic exam: Alert to self but not to situation place or time.  Response time is delayed.  Able to follow instruction and simple command. Speech is truncated, and confused, pupils equal round reactive to light, extraocular movements intact  Normal peripheral visual fields Cranial nerves III through XII normal including no facial droop Follows commands, moves all extremities x4, normal strength to bilateral upper and lower extremities at all major muscle groups including grip Sensation normal to light touch  Coordination intact without dysmetria No pronator drift Gait not tested   Psychiatric:        Mood and Affect: Mood normal.    ED Results / Procedures / Treatments   Labs (all labs ordered are listed, but only abnormal results are displayed) Labs Reviewed  CBC WITH DIFFERENTIAL/PLATELET - Abnormal; Notable for the following components:      Result Value   WBC 15.4 (*)    RBC 5.45 (*)    Platelets 403 (*)    Neutro Abs 12.8 (*)    All other components within normal limits  COMPREHENSIVE METABOLIC PANEL - Abnormal; Notable for the following components:   Glucose, Bld 149 (*)    Albumin 3.4 (*)    All other components within normal limits  CBG MONITORING, ED - Abnormal; Notable for the following  components:   Glucose-Capillary 136 (*)    All other components within normal limits  RESP PANEL BY RT-PCR (FLU A&B, COVID) [id number]  AMMONIA  LACTIC ACID, PLASMA  ETHANOL  URINALYSIS, ROUTINE W REFLEX MICROSCOPIC  RAPID URINE DRUG SCREEN, HOSP PERFORMED    EKG EKG Interpretation  Date/Time:  Wednesday September 06 2020 12:15:57 EDT Ventricular Rate:  88 PR Interval:  135 QRS Duration: 96 QT Interval:  354 QTC Calculation: 429 R Axis:   79 Text Interpretation: Sinus rhythm Low voltage, precordial leads Nonspecific T abnormalities, lateral leads since last tracing no significant change Confirmed by Mancel Bale [id number]) on 09/06/2020 2:51:24 PM  Radiology CT Head Wo Contrast  Result Date: 09/06/2020 CLINICAL DATA:  Delirium EXAM: CT HEAD WITHOUT CONTRAST TECHNIQUE: Contiguous axial images were obtained from the base of the skull through the vertex without intravenous contrast. COMPARISON:  None. FINDINGS: Brain: No evidence of acute infarction, hemorrhage, extra-axial collection, ventriculomegaly, or mass effect. Generalized cerebral atrophy. Periventricular white matter low attenuation likely secondary to microangiopathy. Vascular: Cerebrovascular atherosclerotic calcifications are noted. Skull: Negative for fracture or focal lesion. Sinuses/Orbits: Visualized portions of the orbits are unremarkable. Visualized portions of the paranasal sinuses are unremarkable. Visualized portions of the mastoid air cells are unremarkable. Other: None. IMPRESSION: 1. No acute intracranial pathology. 2. Chronic microvascular disease and cerebral atrophy. Electronically Signed   By: Elige Ko M.D.   On: 09/06/2020 13:47   DG Chest Portable 1 View  Result Date: 09/06/2020 CLINICAL DATA:  Altered mental status. EXAM: PORTABLE CHEST 1 VIEW COMPARISON:  Chest radiograph, 02/01/2016. FINDINGS: Cardiomediastinal silhouette is within normal. Lungs are lung type. Patchy LEFT basilar and middle lobe opacities. No  pleural effusion or pneumothorax. No acute osseous. IMPRESSION: Patchy LEFT basilar middle lobe opacities, suspicious for developing pneumonia. Electronically Signed   By: Roanna Banning M.D.   On: 09/06/2020 12:51    Procedures Procedures   Medications Ordered in ED Medications  cefTRIAXone (ROCEPHIN) 1 g in sodium chloride 0.9 % 100 mL IVPB (has no administration in time range)  azithromycin (ZITHROMAX) tablet 500 mg (has no administration in time range)    ED Course  I have reviewed the triage vital signs and the nursing notes.  Pertinent labs & imaging results that were available during my care of the patient were reviewed by me and considered in my medical decision making (see chart for details).    MDM Rules/Calculators/A&P                           BP 138/82   Pulse 93   Temp 99.4 F (37.4 C) (Rectal)   Resp (!) 23   SpO2 96%   Final Clinical Impression(s) / ED Diagnoses Final diagnoses:  Community acquired pneumonia, unspecified laterality  Aphasia    Rx / DC Orders ED Discharge Orders     None      I was able to obtain history from patient's best friend over the phone, Beather Arbour [phone] who reports he is her best friend as well as her primary caregiver.  He knows the patient well.  Patient was doing fine yesterday morning however around noon time she complained of a severe headache and was having difficulty communicating, or able to answer simple question such as her name.  Symptom has been persistent since.  Patient is a smoker but no history of alcohol use or recreational drug use.  Does have history of diabetes, initial CBG by EMS was 127.  Vital signs overall stable.  I have concern for potential acute stroke causing altered mental status.  She does not exhibit any LVO and she is outside of the window for TNKase/tPA. Work up initiated. Care discussed with Dr. Effie Shy.  12:59 PM Chest x-ray shows patchy left basilar middle lobe opacities suspicious  for developing pneumonia.  In the setting of altered mental status, plan to treat with antibiotic including Rocephin and Zithromax.  However due to the acute onset of aphasia and confusion, will consult neurology for further work-up.  2:59 PM Appreciate consultation from on-call neurologist, Dr. Iver Nestle, who recommend brain MRI if  positive neurology can be consulted and patient may need additional imaging such as head and neck CTA.  Will consult medicine for admission.  3:38 PM Appreciate consultation from [facility] Dr. Dartha Lodge who agrees to see and will admit pt for delirium 2/2 pna and possible stroke.  Labs, CT, and chest x-ray along with EKG have been independently reviewed and interpreted by me.    Fayrene Helper, PA-C 09/06/20 1541    Mancel Bale, MD 09/06/20 [phone]

## 2020-09-06 NOTE — Plan of Care (Signed)
  Problem: Education: Goal: Knowledge of disease or condition will improve Outcome: Progressing Goal: Knowledge of secondary prevention will improve Outcome: Progressing Goal: Knowledge of patient specific risk factors addressed and post discharge goals established will improve Outcome: Progressing Goal: Individualized Educational Video(s) Outcome: Progressing   Problem: Self-Care: Goal: Ability to participate in self-care as condition permits will improve Outcome: Progressing Goal: Verbalization of feelings and concerns over difficulty with self-care will improve Outcome: Progressing Goal: Ability to communicate needs accurately will improve Outcome: Progressing

## 2020-09-06 NOTE — ED Provider Notes (Signed)
  Face-to-face evaluation   History: She presents for evaluation of confusion headache and decreased ability to walk and care for self.  She is unable to give complete history. Level 5 caveat-altered mental status  Physical exam: Alert female alert and responsive.  She has mild expressive aphasia.  Strength is normal arms and legs bilaterally.  No discoordination.  Eyes across the midline to follow examining finger, with questionable mild disconjugate gaze.  Possible mild right rotatory nystagmus.  Medical screening examination/treatment/procedure(s) were conducted as a shared visit with non-physician practitioner(s) and myself.  I personally evaluated the patient during the encounter    Mancel Bale, MD 09/06/20 [phone]

## 2020-09-06 NOTE — Progress Notes (Signed)
Cross-coverage note:   Patient presented today with AMS, has been agitated tonight, pulling at lines, is high fall risk and repeatedly out of bed despite non-pharmacologic measures. Plan to give one dose Haldol now.

## 2020-09-06 NOTE — Progress Notes (Signed)
Pt is agitated and pull on line.Pt is refusing to where the tele box and to stay in bed. Pt is a high risk for fall. Provider Opyd ,MD was notified and Haldol was ordered.Pt was placed on standy for tele.

## 2020-09-06 NOTE — ED Notes (Signed)
Attempted report X1

## 2020-09-06 NOTE — H&P (Signed)
History and Physical    Allison MattesKathryn Hughes ZOX:[id number] DOB: 1950-09-13 DOA: 09/06/2020  PCP: Ralene OkMoreira, Roy, MD   Patient coming from: Home  I have personally briefly reviewed patient's [facility]  Chief Complaint: Altered mental status   Most of the history was obtained from the EMR as patient is unable to provide any history.  HPI: Allison MattesKathryn Hughes is a 70 y.o. female with medical history significant for diabetes mellitus, hypertension, dyslipidemia who presents to the ER via EMS after her neighbor called because patient was confused and per EMS had expressive aphasia.  Last known well was 09/05/20 at about noon.  Neighbor states that the day prior to her admission she also complained of a headache. Her neighbor went back to check on her on the day of admission and patient remained altered and so she called EMS.  She is only oriented to person not to place or time but per EMS her gait was steady. Patient is unable to provide any history due to her mental status and I am unable to do a review of systems. Labs show sodium 137, potassium 3.9, chloride 103, bicarb 25, glucose 149, BUN 12, creatinine 0.68, calcium 9.3, alkaline phosphatase 97, albumin 3.4, AST 16, ALT 17, total protein 7.2, ammonia 14, lactic acid 1.2, white count 15.4, hemoglobin 14.4, hematocrit 44, MCV 81.5, RDW 13.9, platelet count 403, CT scan of the head without contrast shows no acute intracranial pathology.  Chronic microvascular disease and cerebral atrophy. Chest x-ray reviewed by me shows patchy LEFT basilar middle lobe opacities, suspicious for developing pneumonia. Twelve-lead EKG reviewed by me shows normal sinus rhythm, low voltage QRS and T wave inversions in the lateral leads.   ED Course: Patient is a 70 year old female who was brought into the ER by EMS at the request of her friend for evaluation of confusion and expressive aphasia.  Patient's last known well time was at noon 1 day  prior to her admission. Her friend had gone back to check on her on the day of admission and patient's mental status was unchanged so she called EMS. Labs show leukocytosis and chest x-ray is concerning for left middle lobe pneumonia rule out possible aspiration. Patient has been started on antibiotics and will be admitted to the hospital for further evaluation. Neurology was consulted by the physician assistant in the ER who has recommended an MRI and they will do a formal consult if the MRI is positive for an acute stroke. Patient will be admitted to the hospital for further evaluation.   Review of Systems: As per HPI otherwise all other systems reviewed and negative.    Past Medical History:  Diagnosis Date   Diabetes mellitus 2006   T2DM   Hyperlipidemia    Hypertension    Obesity (BMI 30-39.9)     Past Surgical History:  Procedure Laterality Date   CESAREAN SECTION     ORIF ANKLE FRACTURE  05/11/2011   Procedure: OPEN REDUCTION INTERNAL FIXATION (ORIF) ANKLE FRACTURE;  Surgeon: Nadara MustardMarcus V Duda, MD;  Location: [facility] OR;  Service: Orthopedics;  Laterality: Right;   TUBAL LIGATION       reports that she has been smoking cigarettes. She has a 40.00 pack-year smoking history. She quit smokeless tobacco use about 9 years ago. She reports that she does not drink alcohol and does not use drugs.  Allergies  Allergen Reactions   Iodine Swelling   Shrimp [Shellfish Allergy] Swelling    Fresh shrimp  Family History  Problem Relation Age of Onset   Drug abuse Daughter    Diabetes Daughter       Prior to Admission medications   Medication Sig Start Date End Date Taking? Authorizing Provider  ALPRAZolam Prudy Feeler) 1 MG tablet Take 0.5-1 mg by mouth 2 (two) times daily as needed. For anxiety and sleep.    [provider]  atorvastatin (LIPITOR) 40 MG tablet  08/06/18   [provider]  lisinopril (PRINIVIL,ZESTRIL) 20 MG tablet Take 20 mg by mouth daily.    [provider]  metFORMIN (GLUCOPHAGE) 500 MG tablet Take 1 tablet (500 mg total) by mouth daily with breakfast. 11/19/18   Shamleffer, Konrad Dolores, MD  MYRBETRIQ 50 MG TB24 tablet  08/31/18   [provider]  Omega-3 Fatty Acids (OMEGA 3 500 PO) Take by mouth.    [provider]  vitamin B-12 (CYANOCOBALAMIN) 1000 MCG tablet Take 1,000 mcg by mouth daily.      [provider]  Vitamin D, Ergocalciferol, (DRISDOL) 50000 UNITS CAPS Take 50,000 Units by mouth every 7 (seven) days.      [provider]    Physical Exam: Vitals:   09/06/20 1500 09/06/20 1530 09/06/20 1604 09/06/20 1705  BP: 138/82 138/77 118/90 139/71  Pulse: 93 100 90 92  Resp: (!) 23 (!) 26 14 18   Temp:      TempSrc:      SpO2: 96% 97% 94% 97%  Weight:   79.4 kg   Height:   5\' 5"  (1.651 m)      Vitals:   09/06/20 1500 09/06/20 1530 09/06/20 1604 09/06/20 1705  BP: 138/82 138/77 118/90 139/71  Pulse: 93 100 90 92  Resp: (!) 23 (!) 26 14 18   Temp:      TempSrc:      SpO2: 96% 97% 94% 97%  Weight:   79.4 kg   Height:   5\' 5"  (1.651 m)       Constitutional: Alert and oriented x 1.  Oriented to person.  Able to follow simple commands.  Has expressive aphasia.  Not in any apparent distress HEENT:      Head: Normocephalic and atraumatic.         Eyes: PERLA, EOMI, Conjunctivae are normal. Sclera is non-icteric.       Mouth/Throat: Mucous membranes are moist.       Neck: Supple with no signs of meningismus. Cardiovascular: Regular rate and rhythm. No murmurs, gallops, or rubs. 2+ symmetrical distal pulses are present . No JVD. No LE edema Respiratory: Respiratory effort normal .Lungs sounds clear bilaterally. No wheezes, crackles, or rhonchi.  Gastrointestinal: Soft, non tender, and non distended with positive bowel sounds.  Genitourinary: No CVA tenderness. Musculoskeletal: Nontender with normal range of motion in all extremities. No cyanosis, or erythema of  extremities. Neurologic:  Face is symmetric. Moving all extremities. No gross focal neurologic deficits .  Patient has expressive aphasia but is able to follow simple commands Skin: Skin is warm, dry.  No rash or ulcers Psychiatric: Mood and affect are normal    Labs on Admission: I have personally reviewed following labs and imaging studies  CBC: Recent Labs  Lab 09/06/20 1307  WBC 15.4*  NEUTROABS 12.8*  HGB 14.4  HCT 44.4  MCV 81.5  PLT 403*   Basic Metabolic Panel: Recent Labs  Lab 09/06/20 1307  NA 137  K 3.9  CL 103  CO2 25  GLUCOSE 149*  BUN 12  CREATININE 0.68  CALCIUM 9.3   GFR: Estimated Creatinine Clearance: 69.2 mL/min (by C-G formula based on SCr of 0.68 mg/dL). Liver Function Tests: Recent Labs  Lab 09/06/20 1307  AST 16  ALT 17  ALKPHOS 97  BILITOT 0.8  PROT 7.2  ALBUMIN 3.4*   No results for input(s): LIPASE, AMYLASE in the last 168 hours. Recent Labs  Lab 09/06/20 1307  AMMONIA 14   Coagulation Profile: No results for input(s): INR, PROTIME in the last 168 hours. Cardiac Enzymes: No results for input(s): CKTOTAL, CKMB, CKMBINDEX, TROPONINI in the last 168 hours. BNP (last 3 results) No results for input(s): PROBNP in the last 8760 hours. HbA1C: No results for input(s): HGBA1C in the last 72 hours. CBG: Recent Labs  Lab 09/06/20 1220  GLUCAP 136*   Lipid Profile: No results for input(s): CHOL, HDL, LDLCALC, TRIG, CHOLHDL, LDLDIRECT in the last 72 hours. Thyroid Function Tests: No results for input(s): TSH, T4TOTAL, FREET4, T3FREE, THYROIDAB in the last 72 hours. Anemia Panel: No results for input(s): VITAMINB12, FOLATE, FERRITIN, TIBC, IRON, RETICCTPCT in the last 72 hours. Urine analysis:    Component Value Date/Time   COLORURINE YELLOW 02/01/2016 2245   APPEARANCEUR CLOUDY (A) 02/01/2016 2245   LABSPEC >1.030 (H) 02/01/2016 2245   PHURINE 6.0 02/01/2016 2245   GLUCOSEU NEGATIVE 02/01/2016 2245   HGBUR LARGE (A)  02/01/2016 2245   BILIRUBINUR SMALL (A) 02/01/2016 2245   KETONESUR NEGATIVE 02/01/2016 2245   PROTEINUR 100 (A) 02/01/2016 2245   NITRITE POSITIVE (A) 02/01/2016 2245   LEUKOCYTESUR MODERATE (A) 02/01/2016 2245    Radiological Exams on Admission: CT Head Wo Contrast  Result Date: 09/06/2020 CLINICAL DATA:  Delirium EXAM: CT HEAD WITHOUT CONTRAST TECHNIQUE: Contiguous axial images were obtained from the base of the skull through the vertex without intravenous contrast. COMPARISON:  None. FINDINGS: Brain: No evidence of acute infarction, hemorrhage, extra-axial collection, ventriculomegaly, or mass effect. Generalized cerebral atrophy. Periventricular white matter low attenuation likely secondary to microangiopathy. Vascular: Cerebrovascular atherosclerotic calcifications are noted. Skull: Negative for fracture or focal lesion. Sinuses/Orbits: Visualized portions of the orbits are unremarkable. Visualized portions of the paranasal sinuses are unremarkable. Visualized portions of the mastoid air cells are unremarkable. Other: None. IMPRESSION: 1. No acute intracranial pathology. 2. Chronic microvascular disease and cerebral atrophy. Electronically Signed   By: Elige Ko M.D.   On: 09/06/2020 13:47   DG Chest Portable 1 View  Result Date: 09/06/2020 CLINICAL DATA:  Altered mental status. EXAM: PORTABLE CHEST 1 VIEW COMPARISON:  Chest radiograph, 02/01/2016. FINDINGS: Cardiomediastinal silhouette is within normal. Lungs are lung type. Patchy LEFT basilar and middle lobe opacities. No pleural effusion or pneumothorax. No acute osseous. IMPRESSION: Patchy LEFT basilar middle lobe opacities, suspicious for developing pneumonia. Electronically Signed   By: Roanna Banning M.D.   On: 09/06/2020 12:51     Assessment/Plan Principal Problem:   Pneumonia Active Problems:   Essential hypertension   Type 2 diabetes mellitus without complication, without long-term current use of insulin (HCC)   CVA (cerebral  vascular accident) (HCC)     Pneumonia Rule out possible aspiration in a patient who presents to the ER for evaluation of change in mental status concerning for an acute stroke Continue empiric antibiotic therapy with Rocephin and Zithromax for presumed community-acquired pneumonia Keep patient n.p.o. Speech therapy consult for swallow function evaluation     Acute CVA Patient's last known well was 09/05/20 at about noon when she presented to the ER via EMS 24  hours later for evaluation of expressive aphasia and confusion Patient was outside the window for tPA Initial CT scan of the head without contrast was negative for bleed MRI of the brain has been ordered and results are pending Allow for permissive hypertension until an acute stroke has been ruled out Obtain 2D echocardiogram to rule out cardiac thrombus and assess LVEF We will request speech therapy/physical therapy/Occupational Therapy consult Consult neurology if MRI of the brain is positive for an acute stroke Continue aspirin and statin    Diabetes mellitus Hold metformin Glycemic control with sliding scale insulin    Hypertension Hold lisinopril  DVT prophylaxis: SCD  Code Status: full code  Family Communication: Attempted to call patient's daughter, Lenore Manner but her phone is not in service at this time. Disposition Plan: Back to previous home environment Consults called: Speech therapy/physical therapy/Occupational Therapy Status: At the time of admission, it appears that the appropriate admission status for this patient is inpatient. This is judged to be reasonable and necessary in order to provide the required intensity of service to ensure the patient's safety given the presenting symptoms, physical exam findings, and initial radiographic and laboratory data in the context of their comorbid conditions. Patient requires inpatient status due to high intensity of service, high risk for further  deterioration and high frequency of surveillance required.    Lucile Shutters MD [facility]     09/06/2020, 5:06 PM

## 2020-09-06 NOTE — ED Triage Notes (Signed)
Pt here by EMS with c/o of AMS. Per neighbor- Yesterday pt c/o of headache, confusion, expressive aphasia. LKW 09/05/20 @ 1200 Neighbor came back today and pt continued to be altered. A/O to self per EMS. Pt able to ambulate with steady gait. No falls or trauma reports. No blood thinners.  136/100 P 84 RR 16 CBG 127 18g LAC

## 2020-09-07 ENCOUNTER — Inpatient Hospital Stay (HOSPITAL_COMMUNITY): Payer: Medicare Other

## 2020-09-07 ENCOUNTER — Other Ambulatory Visit: Payer: Self-pay

## 2020-09-07 DIAGNOSIS — R4701 Aphasia: Secondary | ICD-10-CM

## 2020-09-07 DIAGNOSIS — I63412 Cerebral infarction due to embolism of left middle cerebral artery: Secondary | ICD-10-CM

## 2020-09-07 DIAGNOSIS — I6389 Other cerebral infarction: Secondary | ICD-10-CM

## 2020-09-07 DIAGNOSIS — E119 Type 2 diabetes mellitus without complications: Secondary | ICD-10-CM

## 2020-09-07 DIAGNOSIS — E785 Hyperlipidemia, unspecified: Secondary | ICD-10-CM

## 2020-09-07 DIAGNOSIS — F17200 Nicotine dependence, unspecified, uncomplicated: Secondary | ICD-10-CM

## 2020-09-07 LAB — CBC
HCT: 41.3 % (ref 36.0–46.0)
Hemoglobin: 13.2 g/dL (ref 12.0–15.0)
MCH: 25.9 pg — ABNORMAL LOW (ref 26.0–34.0)
MCHC: 32 g/dL (ref 30.0–36.0)
MCV: 81.1 fL (ref 80.0–100.0)
Platelets: 365 10*3/uL (ref 150–400)
RBC: 5.09 MIL/uL (ref 3.87–5.11)
RDW: 13.8 % (ref 11.5–15.5)
WBC: 14.9 10*3/uL — ABNORMAL HIGH (ref 4.0–10.5)
nRBC: 0 % (ref 0.0–0.2)

## 2020-09-07 LAB — LIPID PANEL
Cholesterol: 127 mg/dL (ref 0–200)
HDL: 30 mg/dL — ABNORMAL LOW (ref >40)
LDL Cholesterol: 68 mg/dL (ref 0–99)
Total CHOL/HDL Ratio: 4.2 RATIO
Triglycerides: 145 mg/dL (ref <150)
VLDL: 29 mg/dL (ref 0–40)

## 2020-09-07 LAB — ECHOCARDIOGRAM COMPLETE
AR max vel: 1.94 cm2
AV Area VTI: 1.78 cm2
AV Area mean vel: 1.86 cm2
AV Mean grad: 5 mmHg
AV Peak grad: 8.4 mmHg
Ao pk vel: 1.45 m/s
Area-P 1/2: 4.15 cm2
Height: 65 in
S' Lateral: 2.4 cm
Weight: 2564.39 oz

## 2020-09-07 LAB — GLUCOSE, CAPILLARY
Glucose-Capillary: 141 mg/dL — ABNORMAL HIGH (ref 70–99)
Glucose-Capillary: 133 mg/dL — ABNORMAL HIGH (ref 70–99)
Glucose-Capillary: 194 mg/dL — ABNORMAL HIGH (ref 70–99)
Glucose-Capillary: 114 mg/dL — ABNORMAL HIGH (ref 70–99)
Glucose-Capillary: 130 mg/dL — ABNORMAL HIGH (ref 70–99)

## 2020-09-07 LAB — BASIC METABOLIC PANEL
Anion gap: 11 (ref 5–15)
BUN: 9 mg/dL (ref 8–23)
CO2: 23 mmol/L (ref 22–32)
Calcium: 9.8 mg/dL (ref 8.9–10.3)
Chloride: 106 mmol/L (ref 98–111)
Creatinine, Ser: 0.58 mg/dL (ref 0.44–1.00)
GFR, Estimated: >60 mL/min (ref >60)
Glucose, Bld: 142 mg/dL — ABNORMAL HIGH (ref 70–99)
Potassium: 3.3 mmol/L — ABNORMAL LOW (ref 3.5–5.1)
Sodium: 140 mmol/L (ref 135–145)

## 2020-09-07 MED ORDER — CLOPIDOGREL BISULFATE 75 MG PO TABS
300.0000 mg | ORAL_TABLET | Freq: Once | ORAL | Status: AC
  Administered 2020-09-07: 300 mg via ORAL
  Filled 2020-09-07: qty 4

## 2020-09-07 MED ORDER — ASPIRIN EC 81 MG PO TBEC
81.0000 mg | DELAYED_RELEASE_TABLET | Freq: Every day | ORAL | Status: DC
  Administered 2020-09-08: 81 mg via ORAL
  Filled 2020-09-07: qty 1

## 2020-09-07 MED ORDER — CLOPIDOGREL BISULFATE 75 MG PO TABS
75.0000 mg | ORAL_TABLET | Freq: Every day | ORAL | Status: DC
  Administered 2020-09-08: 75 mg via ORAL
  Filled 2020-09-07: qty 1

## 2020-09-07 MED ORDER — GADOBUTROL 1 MMOL/ML IV SOLN
7.5000 mL | Freq: Once | INTRAVENOUS | Status: AC | PRN
  Administered 2020-09-07: 7.5 mL via INTRAVENOUS

## 2020-09-07 MED ORDER — ENOXAPARIN SODIUM 40 MG/0.4ML IJ SOSY
40.0000 mg | PREFILLED_SYRINGE | INTRAMUSCULAR | Status: DC
  Administered 2020-09-07: 40 mg via SUBCUTANEOUS
  Filled 2020-09-07: qty 0.4

## 2020-09-07 MED ORDER — POTASSIUM CHLORIDE CRYS ER 20 MEQ PO TBCR
40.0000 meq | EXTENDED_RELEASE_TABLET | Freq: Once | ORAL | Status: AC
  Administered 2020-09-07: 40 meq via ORAL
  Filled 2020-09-07: qty 2

## 2020-09-07 MED ORDER — ATORVASTATIN CALCIUM 40 MG PO TABS: 40.0000 mg | ORAL_TABLET | Freq: Every day | ORAL | Status: DC

## 2020-09-07 NOTE — Evaluation (Signed)
Physical Therapy Evaluation Patient Details Name: Allison Hughes MRN: [id number] DOB: [birth year] Today's Date: 09/07/2020   History of Present Illness  70 y.o. female presenting to ED 8/24 via EMS with AMS and expressive aphasia after a neighbor called 911. CXR with concern for L middle lobe pneumonia. CT head (-) for acute findings. MRI with small cortical infarct in the posterior left parietal lobe. PMHx significant for DMII, HTN and DLD.  Clinical Impression  Pt doing well with mobility and no further PT needed.  Pt with cognitive and language issues and will defer recommendations for what kind of assist pt may need at home to OT and  SLP.      Follow Up Recommendations No PT follow up    Equipment Recommendations  None recommended by PT    Recommendations for Other Services       Precautions / Restrictions Precautions Precautions: Other (comment) Precaution Comments: confusion      Mobility  Bed Mobility                    Transfers Overall transfer level: Modified independent Equipment used: None Transfers: Sit to/from Stand Sit to Stand: Modified independent (Device/Increase time)         General transfer comment: Performed multiple times without difficulty  Ambulation/Gait Ambulation/Gait assistance: Modified independent (Device/Increase time) Gait Distance (Feet): 450 Feet Assistive device: None Gait Pattern/deviations: WFL(Within Functional Limits) Gait velocity: nomal Gait velocity interpretation: >4.37 ft/sec, indicative of normal walking speed General Gait Details: Pt with steady gait throughout. Able to avoid obstacles independently  Stairs            Wheelchair Mobility    Modified Rankin (Stroke Patients Only) Modified Rankin (Stroke Patients Only) Pre-Morbid Rankin Score: No symptoms Modified Rankin: Slight disability     Balance Overall balance assessment: Independent                           High level  balance activites: Side stepping;Backward walking;Direction changes;Turns;Sudden stops;Head turns High Level Balance Comments: Pt performed all of the above without difficulty. Also able to pick up objects from the floor without difficulty.             Pertinent Vitals/Pain Pain Assessment: No/denies pain    Home Living Family/patient expects to be discharged to:: Private residence Living Arrangements: Alone Available Help at Discharge: Available PRN/intermittently;Friend(s) (boyfriend) Type of Home: House Home Access: Stairs to enter Entrance Stairs-Rails: Right;Left;Can reach both Entrance Stairs-Number of Steps: 3-4 STE at front and side Home Layout: One level Home Equipment: Walker - 2 wheels;Cane - single point;Bedside commode      Prior Function Level of Independence: Needs assistance   Gait / Transfers Assistance Needed: Household and community mobility without AD  ADL's / Homemaking Assistance Needed: I with ADLs; boyfriend assists with meal prep, med management and transportation; patient reports managing her own finances online        Hand Dominance        Extremity/Trunk Assessment   Upper Extremity Assessment Upper Extremity Assessment: Defer to OT evaluation    Lower Extremity Assessment Lower Extremity Assessment: Overall WFL for tasks assessed    Cervical / Trunk Assessment Cervical / Trunk Assessment: Normal  Communication   Communication: Expressive difficulties;Receptive difficulties  Cognition Arousal/Alertness: Awake/alert Behavior During Therapy: Restless Overall Cognitive Status: Difficult to assess Area of Impairment: Safety/judgement;Awareness  Safety/Judgement: Decreased awareness of safety Awareness: Emergent Problem Solving: Slow processing        General Comments      Exercises     Assessment/Plan    PT Assessment Patent does not need any further PT services  PT Problem List          PT Treatment Interventions      PT Goals (Current goals can be found in the Care Plan section)  Acute Rehab PT Goals PT Goal Formulation: All assessment and education complete, DC therapy    Frequency     Barriers to discharge        Co-evaluation               AM-PAC PT "6 Clicks" Mobility  Outcome Measure Help needed turning from your back to your side while in a flat bed without using bedrails?: None Help needed moving from lying on your back to sitting on the side of a flat bed without using bedrails?: None Help needed moving to and from a bed to a chair (including a wheelchair)?: None Help needed standing up from a chair using your arms (e.g., wheelchair or bedside chair)?: None Help needed to walk in hospital room?: None Help needed climbing 3-5 steps with a railing? : None 6 Click Score: 24    End of Session   Activity Tolerance: Patient tolerated treatment well Patient left: in chair;with call bell/phone within reach Nurse Communication: Mobility status PT Visit Diagnosis: Other abnormalities of gait and mobility (R26.89)    Time: 6962-9528 PT Time Calculation (min) (ACUTE ONLY): 16 min   Charges:   PT Evaluation $PT Eval Low Complexity: 1 Low          [facility] PT Acute Rehabilitation Services Pager [phone] Office [phone]   Angelina Ok [facility] 09/07/2020, 2:23 PM

## 2020-09-07 NOTE — Evaluation (Signed)
Speech Language Pathology Evaluation Patient Details Name: Allison Hughes MRN: [id number] DOB: September 29, 1950 Today's Date: 09/07/2020 Time: 1026-1050 SLP Time Calculation (min) (ACUTE ONLY): 24 min  Problem List:  Patient Active Problem List   Diagnosis Date Noted   Pneumonia 09/06/2020   CVA (cerebral vascular accident) (HCC) 09/06/2020   Type 2 diabetes mellitus without complication, without long-term current use of insulin (HCC) 11/20/2018   Type 2 diabetes mellitus with hyperglycemia, without long-term current use of insulin (HCC) 09/18/2018   Sepsis (HCC) 02/02/2016   Acute pyelonephritis 02/02/2016   Hypokalemia 02/02/2016   Anxiety 02/02/2016   Essential hypertension 02/02/2016   Past Medical History:  Past Medical History:  Diagnosis Date   Diabetes mellitus 2006   T2DM   Hyperlipidemia    Hypertension    Obesity (BMI 30-39.9)    Past Surgical History:  Past Surgical History:  Procedure Laterality Date   CESAREAN SECTION     ORIF ANKLE FRACTURE  05/11/2011   Procedure: OPEN REDUCTION INTERNAL FIXATION (ORIF) ANKLE FRACTURE;  Surgeon: Nadara Mustard, MD;  Location: [facility] OR;  Service: Orthopedics;  Laterality: Right;   TUBAL LIGATION     HPI:  Pt is a 70 y.o. female who presented to the ED with report of confusion and noted expressive aphasia by EMS. CT head was negative. MRI brain: Small cortical infarct in the posterior left parietal lobe. Slight  edema without mass effect. Remote infarcts in the inferolateral left cerebellum, right  perirolandic frontoparietal region, and right frontal white matter. CXR 8/24: Patchy left basilar middle lobe opacities, suspicious for developing  pneumonia. PMH: diabetes mellitus, hypertension, dyslipidemia.   Assessment / Plan / Recommendation Clinical Impression  Pt participated in speech/language/cognition evaluation with her best friend, Michele Mcalpine, present. [name] friend denied the pt having any baseline deficits in speech, language, or  cognition, and he described the pt as a, "very brilliant woman". Pt reported that she previously worked in [employer]	[place], but she was unable to provide any further information regarding the nature of her work. Pt agreed with her friend that she is currently retired and was independent prior to admission. Pt presented with aphasia characterized by deficits in receptive and expressive language. She was able to participate in simple conversation, respond accurately to simple yes/no questions, and accurately complete some 2-step commands. However, she exhibited difficulty auditory comprehension beyond these levels. Pt exhibited difficulty with language initiation, additional processing time was needed for sentence formulation, and word retrieval difficulty was frequently observed. Pt benefited from additional processing time for sentence formulation. Assessment of reading comprehension and writing were deferred per pt's request due to reported fatigue since she did not sleep much last night. Motor speech skills were WNL. Formal cognitive-linguistic assessment was deferred due to pt's current language impairments; however, impairments in attention were noted. Skilled SLP services are clinically indicated at this time to target language and for diagnostic therapy.    SLP Assessment  SLP Recommendation/Assessment: Patient needs continued Speech Lanaguage Pathology Services SLP Visit Diagnosis: Aphasia (R47.01)    Follow Up Recommendations  Outpatient SLP    Frequency and Duration min 2x/week  2 weeks      SLP Evaluation Cognition  Overall Cognitive Status: Difficult to assess (due tyo language impairments and pt's cooperation) Arousal/Alertness: Awake/alert Orientation Level: Oriented to place;Oriented to person Year: 2022 Month: August Attention: Sustained Sustained Attention: Impaired Sustained Attention Impairment: Verbal complex Awareness: Impaired Awareness Impairment: Intellectual  impairment       Comprehension  Auditory Comprehension  Overall Auditory Comprehension: Impaired Yes/No Questions: Impaired Basic Biographical Questions:  (5/5) Complex Questions:  (1/5) Commands: Impaired Two Step Basic Commands:  (3/3) Multistep Basic Commands:  (0/3) Conversation: Simple    Expression Expression Primary Mode of Expression: Verbal Verbal Expression Overall Verbal Expression: Impaired Initiation: Impaired Automatic Speech: Counting;Day of week;Month of year (Counting: 10/10 with initial number provided; Days: 7/7 with cues to initiate; months: 12/12) Level of Generative/Spontaneous Verbalization: Conversation Repetition: Impaired Level of Impairment: Phrase level (0/5)   Oral / Motor  Oral Motor/Sensory Function Overall Oral Motor/Sensory Function: Within functional limits Motor Speech Overall Motor Speech: Appears within functional limits for tasks assessed Respiration: Within functional limits Phonation: Normal Resonance: Within functional limits Articulation: Within functional limitis Intelligibility: Intelligible Motor Planning: Witnin functional limits Motor Speech Errors: Not applicable    Torez Beauregard I. Vear Clock, MS, CCC-SLP Acute Rehabilitation Services Office number [phone] Pager [phone]                Scheryl Marten 09/07/2020, 11:19 AM

## 2020-09-07 NOTE — Evaluation (Signed)
Clinical/Bedside Swallow Evaluation Patient Details  Name: Allison Hughes MRN: [id number] Date of Birth: [birth year]  Today's Date: 09/07/2020 Time: SLP Start Time (ACUTE ONLY): 1010 SLP Stop Time (ACUTE ONLY): 1025 SLP Time Calculation (min) (ACUTE ONLY): 15 min  Past Medical History:  Past Medical History:  Diagnosis Date   Diabetes mellitus 2006   T2DM   Hyperlipidemia    Hypertension    Obesity (BMI 30-39.9)    Past Surgical History:  Past Surgical History:  Procedure Laterality Date   CESAREAN SECTION     ORIF ANKLE FRACTURE  05/11/2011   Procedure: OPEN REDUCTION INTERNAL FIXATION (ORIF) ANKLE FRACTURE;  Surgeon: Nadara Mustard, MD;  Location: [facility] OR;  Service: Orthopedics;  Laterality: Right;   TUBAL LIGATION     HPI:  Pt is a 70 y.o. female who presented to the ED with report of confusion and noted expressive aphasia by EMS. CT head was negative. MRI brain: Small cortical infarct in the posterior left parietal lobe. Slight  edema without mass effect. Remote infarcts in the inferolateral left cerebellum, right  perirolandic frontoparietal region, and right frontal white matter. CXR 8/24: Patchy left basilar middle lobe opacities, suspicious for developing  pneumonia. PMH: diabetes mellitus, hypertension, dyslipidemia.   Assessment / Plan / Recommendation Clinical Impression  Pt was seen for bedside swallow evaluation with her best friend present. Both parties denied the pt having a history of dysphagia. Oral mechanism exam was limited due to pt's difficulty following some commands; however, oral motor strength and ROM appeared grossly WFL, and she presented with full dentures. She tolerated all solids and liquids without signs or symptoms of oropharyngeal dysphagia. A regular texture diet with thin liquids is recommended at this time and further skilled SLP services are not clinically indicated for swallowing. SLP Visit Diagnosis: Dysphagia, unspecified (R13.10)    Aspiration  Risk  No limitations    Diet Recommendation Regular;Thin liquid   Liquid Administration via: Cup;Straw Medication Administration: Whole meds with liquid Supervision: Patient able to self feed    Other  Recommendations Oral Care Recommendations: Oral care BID;Patient independent with oral care   Follow up Recommendations  (TBD following SLE)      Frequency and Duration            Prognosis        Swallow Study   General Date of Onset: 09/06/20 HPI: Pt is a 70 y.o. female who presented to the ED with report of confusion and noted expressive aphasia by EMS. CT head was negative. MRI brain: Small cortical infarct in the posterior left parietal lobe. Slight  edema without mass effect. Remote infarcts in the inferolateral left cerebellum, right  perirolandic frontoparietal region, and right frontal white matter. CXR 8/24: Patchy left basilar middle lobe opacities, suspicious for developing  pneumonia. PMH: diabetes mellitus, hypertension, dyslipidemia. Type of Study: Bedside Swallow Evaluation Previous Swallow Assessment: none Diet Prior to this Study: NPO Temperature Spikes Noted: No Respiratory Status: Room air History of Recent Intubation: No Behavior/Cognition: Alert;Cooperative Oral Cavity Assessment: Within Functional Limits Oral Care Completed by SLP: No Oral Cavity - Dentition: Dentures, top;Dentures, bottom Vision: Functional for self-feeding Self-Feeding Abilities: Able to feed self Patient Positioning: Upright in bed;Postural control adequate for testing Baseline Vocal Quality: Normal Volitional Cough: Strong Volitional Swallow: Able to elicit    Oral/Motor/Sensory Function Overall Oral Motor/Sensory Function: Within functional limits   Ice Chips Ice chips: Within functional limits Presentation: Spoon   Thin Liquid Thin Liquid: Within functional limits Presentation:  Straw    Nectar Thick Nectar Thick Liquid: Not tested   Honey Thick Honey Thick Liquid: Not  tested   Puree Puree: Within functional limits Presentation: Spoon   Solid     Solid: Within functional limits     Elliet Goodnow I. Vear Clock, MS, CCC-SLP Acute Rehabilitation Services Office number [phone] Pager [phone]  Scheryl Marten 09/07/2020,11:01 AM

## 2020-09-07 NOTE — Progress Notes (Addendum)
PROGRESS NOTE  Allison Hughes XNA:[id number] DOB: 03-09-1950 DOA: 09/06/2020 PCP: Ralene Ok, MD   LOS: 1 day   Brief narrative:  Allison Hughes is a 70 y.o. female with medical history significant for diabetes mellitus, hypertension, dyslipidemia presented to hospital after being confused and having difficulty expressing.  Patient was brought in by the EMS after being called by the neighbor who stated that patient had complained of some headache.  Last known well was 09/05/20 at about noon.  In the ED vitals were stable.  Laboratory data was essentially within normal range except for mild leukocytosis.  CT head scan without any acute findings but chest x-ray showed patchy left basilar opacity.  EKG showed normal sinus rhythm.  Patient was given antibiotic and neurology was consulted.  Neurology requested MRI scan and the patient was admitted to hospital for further evaluation and treatment  Assessment/Plan:  Principal Problem:   Pneumonia Active Problems:   Essential hypertension   Type 2 diabetes mellitus without complication, without long-term current use of insulin (HCC)   CVA (cerebral vascular accident) (HCC)   Pneumonia Continue Rocephin and Zithromax for possible aspiration pneumonia.  Speech and swallow evaluation.      Acute CVA with expressive aphasia/confusion Possibility of embolic CVA.  Patient's last known well was 09/05/20 at about noon.  Neurology has been consulted.  Outside tPA window.  CT it was negative for acute bleed.  MRI of the brain shows small cortical infarct in the posterior left parietal lobe with remote infarcts in the cerebellum frontoparietal region and white matter.  Check 2D echocardiogram PT OT.  Continue aspirin and statins.  2D echocardiogram with LV ejection fraction of 60 to 65%.  Iodine allergy listed so MRA has been ordered for further evaluation.  Continue neurochecks.  Continue aspirin and Plavix.  Continue Lipitor.  speech consultation recommend  outpatient speech therapy..  Continue telemetry monitoring.  Spoke with neurology at bedside as well.   Diabetes mellitus type II. Continue to hold oral hypoglycemic agents.  Continue sliding scale insulin Accu-Cheks.  Closely monitor blood pressure.   Hypertension Hold lisinopril for permissive hypertension.  Hypokalemia.  Mild we will replenish orally.  Check levels in a.m.  DVT prophylaxis: SCDs Start: 09/06/20 1647   Code Status: Full code  Family Communication: I spoke with the patient's friend at bedside who is also the caretaker for the patient.  Status is: Inpatient  Remains inpatient appropriate because:IV treatments appropriate due to intensity of illness or inability to take PO and Inpatient level of care appropriate due to severity of illness  Dispo: The patient is from: Home              Anticipated d/c is to: Home              Patient currently is not medically stable to d/c.   Difficult to place patient No   Consultants: Neurology  Procedures: None  Anti-infectives:  Rocephin and Zithromax IV  Anti-infectives (From admission, onward)    Start     Dose/Rate Route Frequency Ordered Stop   09/07/20 1700  cefTRIAXone (ROCEPHIN) 2 g in sodium chloride 0.9 % 100 mL IVPB        2 g 200 mL/hr over 30 Minutes Intravenous Every 24 hours 09/06/20 1802 09/11/20 1659   09/06/20 1830  cefTRIAXone (ROCEPHIN) 1 g in sodium chloride 0.9 % 100 mL IVPB        1 g 200 mL/hr over 30 Minutes Intravenous  Once 09/06/20  1802 09/06/20 1905   09/06/20 1800  azithromycin (ZITHROMAX) 500 mg in sodium chloride 0.9 % 250 mL IVPB        500 mg 250 mL/hr over 60 Minutes Intravenous Every 24 hours 09/06/20 1647 09/11/20 1759   09/06/20 1700  cefTRIAXone (ROCEPHIN) 2 g in sodium chloride 0.9 % 100 mL IVPB  Status:  Discontinued        2 g 200 mL/hr over 30 Minutes Intravenous Every 24 hours 09/06/20 1647 09/06/20 1801   09/06/20 1300  cefTRIAXone (ROCEPHIN) 1 g in sodium chloride 0.9  % 100 mL IVPB        1 g 200 mL/hr over 30 Minutes Intravenous  Once 09/06/20 1258 09/06/20 1643   09/06/20 1300  azithromycin (ZITHROMAX) tablet 500 mg  Status:  Discontinued        500 mg Oral  Once 09/06/20 1258 09/06/20 1606       Subjective: Today, patient was seen and examined at bedside.  Patient appears to be alert awake and communicative.  Denies any headache, nausea, shortness of breath or fever.  Objective: Vitals:   09/06/20 1931 09/07/20 0332  BP: 134/83 114/88  Pulse: 79 85  Resp: 18 12  Temp: 98 F (36.7 C) 98 F (36.7 C)  SpO2: 99% 94%    Intake/Output Summary (Last 24 hours) at 09/07/2020 1402 Last data filed at 09/07/2020 0828 Gross per 24 hour  Intake 3 ml  Output 300 ml  Net -297 ml   Filed Weights   09/06/20 1604 09/06/20 1718  Weight: 79.4 kg 72.7 kg   Body mass index is 26.67 kg/m.   Physical Exam:  GENERAL: Patient is alert awake and oriented. Not in obvious distress.  Answering appropriately at the time of my exam HENT: No scleral pallor or icterus. Pupils equally reactive to light. Oral mucosa is moist NECK: is supple, no gross swelling noted. CHEST: Clear to auscultation. No crackles or wheezes.  Diminished breath sounds bilaterally. CVS: S1 and S2 heard, no murmur. Regular rate and rhythm.  ABDOMEN: Soft, non-tender, bowel sounds are present. EXTREMITIES: No edema. CNS: Cranial nerves are intact. No focal motor deficits. SKIN: warm and dry without rashes.  Data Review: I have personally reviewed the following laboratory data and studies,  CBC: Recent Labs  Lab 09/06/20 1307 09/07/20 0448  WBC 15.4* 14.9*  NEUTROABS 12.8*  --   HGB 14.4 13.2  HCT 44.4 41.3  MCV 81.5 81.1  PLT 403* 365   Basic Metabolic Panel: Recent Labs  Lab 09/06/20 1307 09/07/20 0448  NA 137 140  K 3.9 3.3*  CL 103 106  CO2 25 23  GLUCOSE 149* 142*  BUN 12 9  CREATININE 0.68 0.58  CALCIUM 9.3 9.8   Liver Function Tests: Recent Labs  Lab  09/06/20 1307  AST 16  ALT 17  ALKPHOS 97  BILITOT 0.8  PROT 7.2  ALBUMIN 3.4*   No results for input(s): LIPASE, AMYLASE in the last 168 hours. Recent Labs  Lab 09/06/20 1307  AMMONIA 14   Cardiac Enzymes: No results for input(s): CKTOTAL, CKMB, CKMBINDEX, TROPONINI in the last 168 hours. BNP (last 3 results) No results for input(s): BNP in the last 8760 hours.  ProBNP (last 3 results) No results for input(s): PROBNP in the last 8760 hours.  CBG: Recent Labs  Lab 09/06/20 1925 09/06/20 2312 09/07/20 0310 09/07/20 0727 09/07/20 1151  GLUCAP 116* 105* 141* 133* 194*   Recent Results (from the past 240  hour(s))  Resp Panel by RT-PCR (Flu A&B, Covid) Nasopharyngeal Swab     Status: None   Collection Time: 09/06/20 12:15 PM   Specimen: Nasopharyngeal Swab; Nasopharyngeal(NP) swabs in vial transport medium  Result Value Ref Range Status   SARS Coronavirus 2 by RT PCR NEGATIVE NEGATIVE Final    Comment: (NOTE) SARS-CoV-2 target nucleic acids are NOT DETECTED.  The SARS-CoV-2 RNA is generally detectable in upper respiratory specimens during the acute phase of infection. The lowest concentration of SARS-CoV-2 viral copies this assay can detect is 138 copies/mL. A negative result does not preclude SARS-Cov-2 infection and should not be used as the sole basis for treatment or other patient management decisions. A negative result may occur with  improper specimen collection/handling, submission of specimen other than nasopharyngeal swab, presence of viral mutation(s) within the areas targeted by this assay, and inadequate number of viral copies(<138 copies/mL). A negative result must be combined with clinical observations, patient history, and epidemiological information. The expected result is Negative.  Fact Sheet for Patients:  BloggerCourse.com  Fact Sheet for Healthcare Providers:  SeriousBroker.it  This test is  no t yet approved or cleared by the [place] FDA and  has been authorized for detection and/or diagnosis of SARS-CoV-2 by FDA under an Emergency Use Authorization (EUA). This EUA will remain  in effect (meaning this test can be used) for the duration of the COVID-19 declaration under Section 564(b)(1) of the Act, 21 U.S.C.section 360bbb-3(b)(1), unless the authorization is terminated  or revoked sooner.       Influenza A by PCR NEGATIVE NEGATIVE Final   Influenza B by PCR NEGATIVE NEGATIVE Final    Comment: (NOTE) The Xpert Xpress SARS-CoV-2/FLU/RSV plus assay is intended as an aid in the diagnosis of influenza from Nasopharyngeal swab specimens and should not be used as a sole basis for treatment. Nasal washings and aspirates are unacceptable for Xpert Xpress SARS-CoV-2/FLU/RSV testing.  Fact Sheet for Patients: BloggerCourse.com  Fact Sheet for Healthcare Providers: SeriousBroker.it  This test is not yet approved or cleared by the [place] FDA and has been authorized for detection and/or diagnosis of SARS-CoV-2 by FDA under an Emergency Use Authorization (EUA). This EUA will remain in effect (meaning this test can be used) for the duration of the COVID-19 declaration under Section 564(b)(1) of the Act, 21 U.S.C. section 360bbb-3(b)(1), unless the authorization is terminated or revoked.  Performed at [facility], [address] [place], [place] [address]      Studies: DG Abdomen 1 View  Result Date: 09/06/2020 CLINICAL DATA:  Metal screening for MRI EXAM: ABDOMEN - 1 VIEW COMPARISON:  None. FINDINGS: Some form of battery pack projects over the right lower chest. Recommend clinical correlation. No radiopaque foreign bodies in the abdomen or pelvis. Normal bowel gas pattern. No organomegaly or free air. IMPRESSION: Battery pack projects over the right chest. Recommend clinical correlation. No radiopaque foreign  bodies in the abdomen or pelvis. Electronically Signed   By: Charlett Nose M.D.   On: 09/06/2020 17:48   CT Head Wo Contrast  Result Date: 09/06/2020 CLINICAL DATA:  Delirium EXAM: CT HEAD WITHOUT CONTRAST TECHNIQUE: Contiguous axial images were obtained from the base of the skull through the vertex without intravenous contrast. COMPARISON:  None. FINDINGS: Brain: No evidence of acute infarction, hemorrhage, extra-axial collection, ventriculomegaly, or mass effect. Generalized cerebral atrophy. Periventricular white matter low attenuation likely secondary to microangiopathy. Vascular: Cerebrovascular atherosclerotic calcifications are noted. Skull: Negative for fracture or focal  lesion. Sinuses/Orbits: Visualized portions of the orbits are unremarkable. Visualized portions of the paranasal sinuses are unremarkable. Visualized portions of the mastoid air cells are unremarkable. Other: None. IMPRESSION: 1. No acute intracranial pathology. 2. Chronic microvascular disease and cerebral atrophy. Electronically Signed   By: Elige Ko M.D.   On: 09/06/2020 13:47   MR BRAIN WO CONTRAST  Result Date: 09/07/2020 CLINICAL DATA:  Neuro deficit, acute, stroke suspected EXAM: MRI HEAD WITHOUT CONTRAST TECHNIQUE: Multiplanar, multiecho pulse sequences of the brain and surrounding structures were obtained without intravenous contrast. COMPARISON:  CT head 09/06/2020. FINDINGS: Brain: Small cortical infarct in the posterior left parietal lobe. Slight edema without mass effect. Remote infarcts in the inferolateral left cerebellum, right perirolandic frontoparietal region, and right frontal white matter with associated encephalomalacia and gliosis. Moderate patchy confluent T2 hyperintensity in the supratentorial and pontine white matter, nonspecific but compatible with chronic microvascular ischemic disease. No evidence of acute hemorrhage, mass lesion, midline shift, extra-axial fluid collection, or hydrocephalus.  Mild-to-moderate atrophy with ex vacuo ventricular dilation. Bilateral basal ganglia mineralization. Vascular: Major arterial flow voids are maintained at the skull base. Skull and upper cervical spine: Normal marrow signal. Sinuses/Orbits: Largely clear sinuses. No evidence of acute orbital abnormality. Other: No mastoid effusions. IMPRESSION: 1. Small cortical infarct in the posterior left parietal lobe.Slight edema without mass effect. 2. Remote infarcts in the inferolateral left cerebellum, right perirolandic frontoparietal region, and right frontal white matter. 3. Moderate chronic microvascular ischemic disease and mild to moderate atrophy. Electronically Signed   By: Feliberto Harts M.D.   On: 09/07/2020 09:28   DG Chest Portable 1 View  Result Date: 09/06/2020 CLINICAL DATA:  Altered mental status. EXAM: PORTABLE CHEST 1 VIEW COMPARISON:  Chest radiograph, 02/01/2016. FINDINGS: Cardiomediastinal silhouette is within normal. Lungs are lung type. Patchy LEFT basilar and middle lobe opacities. No pleural effusion or pneumothorax. No acute osseous. IMPRESSION: Patchy LEFT basilar middle lobe opacities, suspicious for developing pneumonia. Electronically Signed   By: Roanna Banning M.D.   On: 09/06/2020 12:51      Joycelyn Das, MD  [facility] 09/07/2020  If 7PM-7AM, please contact night-coverage

## 2020-09-07 NOTE — Consult Note (Addendum)
Neurology Consultation Reason for Consult: Acute stroke Requesting Physician: Joycelyn Das  CC: Aphasia  History is obtained from: Patient neighbor/close friend at bedside Ellery Plunk and chart review  HPI: Allison Hughes is a 70 y.o. female with a past medical history significant for diabetes, hypertension, hyperlipidemia, obesity, anxiety, 40-pack-year smoking history (ongoing smoking), prior chewing tobacco use,  She was noted to be confused on 8/23 around noon by a neighbor.  Later that afternoon at 2 or 3 PM he noted that her speech was very impaired but she was unwilling to go to the hospital.  The next day when he came to check on her she was having a severe headache, prompting him to call EMS and she did agree to transport at that time.  Initial head CT was notable for age-indeterminate hypodensity, chronic microvascular disease, and work-up was also notable for pneumonia and leukocytosis.  Initial vitals were notable for temperature of 99.4, blood pressures of 140s over 70s, heart rate 80s-90s, O2 saturations of low 90s, improving to high 90s on 2 L, and tachypnea to the 20s.   At baseline Allison Hughes reports that she does not own a car but would physically be able to drive and manages all of her affairs herself and is fully independent.  Notably she did receive 5 mg of Haldol at 2215 on 8/24  LKW: 8/22 noon, approximately 2 or 3 PM symptom discovery tPA given?: No, due to out of the window Premorbid modified rankin scale: 0     0 - No symptoms  ROS: Unable to obtain reliably due to aphasia, however she is able to confirm that she had a headache briefly and denies any other complaints including any pulmonary complaints, which her neighbor confirms.  Past Medical History:  Diagnosis Date   Diabetes mellitus 2006   T2DM   Hyperlipidemia    Hypertension    Obesity (BMI 30-39.9)    Past Surgical History:  Procedure Laterality Date   CESAREAN SECTION     ORIF ANKLE FRACTURE   05/11/2011   Procedure: OPEN REDUCTION INTERNAL FIXATION (ORIF) ANKLE FRACTURE;  Surgeon: Nadara Mustard, MD;  Location: [facility] OR;  Service: Orthopedics;  Laterality: Right;   TUBAL LIGATION     Current Outpatient Medications  Medication Instructions   ALPRAZolam (XANAX) 0.5-1 mg, 2 times daily PRN   atorvastatin (LIPITOR) 40 mg, Oral, Daily   lisinopril (ZESTRIL) 20 mg, Oral, Daily   metFORMIN (GLUCOPHAGE) 500 mg, Oral, Daily with breakfast   Myrbetriq 50 mg, Oral, Daily   Omega-3 Fatty Acids (OMEGA 3 500 PO) 1 capsule, Oral, Daily   Tresiba FlexTouch 15 Units, Subcutaneous, Daily   triamcinolone cream (KENALOG) 0.1 % 1 application, Topical, 3 times daily   vitamin B-12 (CYANOCOBALAMIN) 1,000 mcg, Daily   Family History  Problem Relation Age of Onset   Drug abuse Daughter    Diabetes Daughter     Social History:  reports that she has been smoking cigarettes. She has a 40.00 pack-year smoking history. She quit smokeless tobacco use about 9 years ago. She reports that she does not drink alcohol and does not use drugs.   Exam: Current vital signs: BP 114/88 (BP Location: Left Arm)   Pulse 85   Temp 98 F (36.7 C)   Resp 12   Ht 5\' 5"  (1.651 m)   Wt 72.7 kg   SpO2 94%   BMI 26.67 kg/m  Vital signs in last 24 hours: Temp:  [98 F (36.7 C)-98.1 F (36.7  C)] 98 F (36.7 C) (08/25 0332) Pulse Rate:  [79-100] 85 (08/25 0332) Resp:  [12-26] 12 (08/25 0332) BP: (114-148)/(71-90) 114/88 (08/25 0332) SpO2:  [93 %-99 %] 94 % (08/25 0332) Weight:  [72.7 kg-79.4 kg] 72.7 kg (08/24 1718)   Physical Exam  Constitutional: Appears well-developed and well-nourished.  Psych: Affect appropriate to situation, mildly frustrated secondary to communication difficulties Eyes: No scleral injection HENT: No oropharyngeal obstruction.  MSK: no joint deformities.  Cardiovascular: Normal rate and regular rhythm.  Respiratory: Effort normal, non-labored breathing GI: Soft.  No distension. There is  no tenderness.  Skin: Warm dry and intact visible skin  Neuro: Mental Status: Patient is awake, alert, oriented to person, place, month, year, and situation (able to tell me she is here because she has had a stroke) Patient is able to give some history but her speech is slowed and halting, with significant word finding difficulty.  No evidence of neglect Cranial Nerves: II: Visual Fields are full by orientation to stimuli in all quadrants.  III,IV, VI: EOMI without ptosis or diploplia.  V: Facial sensation is symmetric to temperature and light touch VII: Facial movement is symmetric.  VIII: hearing is intact to voice X: Uvula elevates symmetrically XI: Shoulder shrug is symmetric. XII: tongue is midline without atrophy or fasciculations.  Motor: Tone is normal. Bulk is normal. 5/5 strength was present in all four extremities to the best of my ability to determine although she did struggle some with confrontational testing of the lower extremities Sensory: Sensation is symmetric to light touch and temperature in the arms and legs.  She seemed equally reactive to touch in all 4 extremities and did report that sensation was the same Deep Tendon Reflexes: 2+ and symmetric in the biceps and patellae --she is brisk throughout Cerebellar: FNF is notable for end reach tremor bilaterally possibly slightly worse on the right than the left.  Motor coordination of the lower extremities seems intact within my ability to test given her aphasia  NIHSS total 4 Score breakdown: One-point for inconsistently following commands, 2 points for tremor in the bilateral upper extremities (chronic per friend at bedside, new per patient), and one-point for mild to moderate aphasia    I have reviewed labs in epic and the results pertinent to this consultation are:   Basic Metabolic Panel: Recent Labs  Lab 09/06/20 1307 09/07/20 0448  NA 137 140  K 3.9 3.3*  CL 103 106  CO2 25 23  GLUCOSE 149* 142*   BUN 12 9  CREATININE 0.68 0.58  CALCIUM 9.3 9.8    CBC: Recent Labs  Lab 09/06/20 1307 09/07/20 0448  WBC 15.4* 14.9*  NEUTROABS 12.8*  --   HGB 14.4 13.2  HCT 44.4 41.3  MCV 81.5 81.1  PLT 403* 365   Lab Results  Component Value Date   CHOL 127 09/07/2020   HDL 30 (L) 09/07/2020   LDLCALC 68 09/07/2020   TRIG 145 09/07/2020   CHOLHDL 4.2 09/07/2020   Lab Results  Component Value Date   HGBA1C 8.6 (H) 09/06/2020   No results found for: TSH   No results found for: VITAMINB12    I have reviewed the images obtained:  Head CT personally reviewed, no clear acute intracranial process though there is significant microvascular disease  MRI brain personally reviewed, small posterior parietal stroke in the left MCA/PCA watershed territory, with remote infarcts in the left cerebellum, right frontoparietal region and right frontal white matter as well as significant  chronic microvascular disease  Echocardiogram  1. Left ventricular ejection fraction, by estimation, is 60 to 65%. The left ventricle has normal function. The left ventricle has no regional wall motion abnormalities. Left ventricular diastolic parameters were normal.   2. Right ventricular systolic function is normal. The right ventricular size is normal.   3. The mitral valve is normal in structure. No evidence of mitral valve regurgitation. No evidence of mitral stenosis.   4. The aortic valve is tricuspid. Aortic valve regurgitation is not visualized. Mild aortic valve sclerosis is present, with no evidence of aortic valve stenosis.    5. The inferior vena cava is normal in size with greater than 50% respiratory variability, suggesting right atrial pressure of 3 mmHg.  Normal biatrial sizes  Assessment: This is a 70 year old woman with stroke risk factors of diabetes, hypertension, hyperlipidemia, ongoing tobacco abuse, presenting with altered mental status and found to have a small embolic appearing stroke as  well as concern for pneumonia.  Impression:  -Small embolic appearing stroke in the left MCA/PCA territory -Possible community-acquired pneumonia on ceftriaxone and azithromycin though patient/friend report being asymptomatic from a pulmonary perspective  Recommendations: # Left MCA/PCA territory stroke, currently embolic of unknown source (cryptogenic) - CTA head and neck considered but due to iodine allergy which we are unable to clarify given her aphasia and limited medical records we will proceed with MRA head and neck - Frequent neuro checks - Prophylactic therapy-Antiplatelet med: Aspirin - dose 325mg  PO or 300mg  PR, followed by 81 mg daily - Plavix 300 mg load with 75 mg daily for 21 - 90 day course, course to be determined based on vessel imaging - Continue home atorvastatin 40 mg daily as patient is meeting LDL goal less than 70 - Risk factor modification, diet, exercise, smoking cessation - Telemetry monitoring, if vessel imaging is not revealing of an atheroembolic source, loop recorder placement for cryptogenic stroke is recommended on an inpatient basis - Blood pressure goal   - Normotension as patient is out of the permissive hypertension window - Appreciate Speech consult, likely does not need PT/OT given her strength appears to be at baseline and her major deficit appears to be purely language - Appreciate management of comorbidities per primary team including treatment of possible pneumonia - Stroke team to follow  MD-PhD [facility] [phone] Available 7 AM to 7 PM, outside these hours please contact Neurologist on call listed on AMION

## 2020-09-07 NOTE — Progress Notes (Signed)
  Echocardiogram 2D Echocardiogram has been performed.  Roosvelt Maser F 09/07/2020, 1:56 PM

## 2020-09-07 NOTE — Evaluation (Signed)
Occupational Therapy Evaluation Patient Details Name: Allison Hughes MRN: [id number] DOB: [birth year] Today's Date: 09/07/2020    History of Present Illness 70 y.o. female presenting to ED 8/24 via EMS with AMS and expressive aphasia after a neighbor called 911. CXR with concern for L middle lobe pneumonia. CT head (-) for acute findings. MRI pending. PMHx significant for DMII, HTN and DLD.   Clinical Impression   Patient is a questionable historian presenting for OT evaluation secondary to the above. Patient able to answer PLOF and home set-up questions but noted conflicting information provided. Per patient report she was living alone in a private residence and was grossly I with ADLs without AD. Boyfriend assists with med management, meal prep and transportation but patient was managing her own finances. Patient currently functioning below baseline demonstrating observed ADLs including 3/3 parts of toileting task, LB dressing and grooming standing at sink level with Min guard to supervision A. Patient also limited by deficits listed below including decreased cognition scoring 8/28 on SBT indicating questionable cognitive impairment and mild balance deficits and would benefit from continued acute OT services in prep for safe d/c home if significant other is able to provide assist with IADLs including med management and money management. OT will continue to follow acutely.     Follow Up Recommendations  No OT follow up;Supervision/Assistance - 24 hour (Assist with med management, meal prep and money management)    Equipment Recommendations  None recommended by OT    Recommendations for Other Services       Precautions / Restrictions Precautions Precautions: Fall Precaution Comments: HOH Restrictions Weight Bearing Restrictions: No      Mobility Bed Mobility Overal bed mobility: Needs Assistance Bed Mobility: Supine to Sit;Sit to Supine     Supine to sit: Supervision Sit to supine:  Supervision   General bed mobility comments: Supervision A for safety    Transfers Overall transfer level: Needs assistance Equipment used: None Transfers: Sit to/from Stand Sit to Stand: Supervision;Min guard         General transfer comment: Supervision to Min gaurd for steadying/safety.    Balance Overall balance assessment: Mild deficits observed, not formally tested                                         ADL either performed or assessed with clinical judgement   ADL Overall ADL's : Needs assistance/impaired     Grooming: Supervision/safety;Standing Grooming Details (indicate cue type and reason): 2/3 grooming tasks standing at sink level with supervision A for safety.             Lower Body Dressing: Min guard;Sit to/from stand   Toilet Transfer: Hydrographic surveyor Details (indicate cue type and reason): Min guard for steadying. Toileting- Architect and Hygiene: Min guard Toileting - Clothing Manipulation Details (indicate cue type and reason): Min guard for steadying.     Functional mobility during ADLs: Min guard       Vision Baseline Vision/History: 1 Wears glasses Ability to See in Adequate Light: 0 Adequate Patient Visual Report: No change from baseline Vision Assessment?: No apparent visual deficits     Perception     Praxis      Pertinent Vitals/Pain Pain Assessment: No/denies pain     Hand Dominance     Extremity/Trunk Assessment Upper Extremity Assessment Upper Extremity Assessment: Overall WFL for tasks assessed  Lower Extremity Assessment Lower Extremity Assessment: Defer to PT evaluation   Cervical / Trunk Assessment Cervical / Trunk Assessment: Normal   Communication Communication Communication: No difficulties   Cognition Arousal/Alertness: Awake/alert Behavior During Therapy: Restless Overall Cognitive Status: Impaired/Different from baseline Area of Impairment:  Safety/judgement;Awareness;Problem solving                         Safety/Judgement: Decreased awareness of safety Awareness: Emergent Problem Solving: Slow processing General Comments: Patient A&Ox4; follows 1-step verbal commands with good accuracy; requires increased time to process verbal information; decreased awareness for safety requiring cues; scored 8/28 on SBT indicating questionable cognitive impairment.   General Comments  VSS on RA.    Exercises     Shoulder Instructions      Home Living Family/patient expects to be discharged to:: Private residence Living Arrangements: Alone Available Help at Discharge: Other (Comment);Available PRN/intermittently (Significant other) Type of Home: House Home Access: Stairs to enter [employer] of Steps: 3-4 STE at front and side Entrance Stairs-Rails: Right;Left;Can reach both Home Layout: One level     Bathroom Shower/Tub: Tub/shower unit;Walk-in shower   Bathroom Toilet: Standard     Home Equipment: Environmental consultant - 2 wheels;Cane - single point;Bedside commode          Prior Functioning/Environment Level of Independence: Needs assistance  Gait / Transfers Assistance Needed: Household and community mobility without AD ADL's / Homemaking Assistance Needed: I with ADLs; boyfriend assists with meal prep, med management and transportation; patient reports managing her own finances online   Comments: Patient is a questionable historian providing conflicting information at times. Will need to confirm equipment and level of supervision available prior to d/c.        OT Problem List: Impaired balance (sitting and/or standing);Decreased cognition;Decreased safety awareness      OT Treatment/Interventions: Self-care/ADL training;Therapeutic exercise;DME and/or AE instruction;Therapeutic activities;Cognitive remediation/compensation;Patient/family education;Balance training    OT Goals(Current goals can be found in  the care plan section) Acute Rehab OT Goals Patient Stated Goal: To return home. OT Goal Formulation: With patient Time For Goal Achievement: 09/21/20 Potential to Achieve Goals: Good ADL Goals Additional ADL Goal #1: Patient will complete ADLs with I in prep for safe d/c home. Additional ADL Goal #2: Patient will score <4/28 on SBT indicating improved cognition in prep for safe return home. Additional ADL Goal #3: Patient will complete ADL transfers with I in prep for safe return home.  OT Frequency: Min 2X/week   Barriers to D/C:            Co-evaluation              AM-PAC OT "6 Clicks" Daily Activity     Outcome Measure Help from another person eating meals?: None Help from another person taking care of personal grooming?: A Little Help from another person toileting, which includes using toliet, bedpan, or urinal?: A Little Help from another person bathing (including washing, rinsing, drying)?: A Little Help from another person to put on and taking off regular upper body clothing?: A Little Help from another person to put on and taking off regular lower body clothing?: A Little 6 Click Score: 19   End of Session Nurse Communication: Mobility status  Activity Tolerance: Patient tolerated treatment well Patient left: in bed;Other (comment) (RN preparing patient for MRI)  OT Visit Diagnosis: Unsteadiness on feet (R26.81);Other symptoms and signs involving cognitive function  Time: 6433-2951 OT Time Calculation (min): 24 min Charges:  OT General Charges $OT Visit: 1 Visit OT Evaluation $OT Eval Low Complexity: 1 Low OT Treatments $Self Care/Home Management : 8-22 mins  Aoki Wedemeyer H. OTR/L Supplemental OT, Department of rehab services [phone]  Tykia Mellone R H. 09/07/2020, 8:48 AM

## 2020-09-08 ENCOUNTER — Encounter (HOSPITAL_COMMUNITY)
Admission: EM | Disposition: A | Discharge status: Home / Self Care | Payer: Self-pay | Source: Home / Self Care | Attending: Internal Medicine

## 2020-09-08 DIAGNOSIS — I6389 Other cerebral infarction: Secondary | ICD-10-CM

## 2020-09-08 HISTORY — PX: LOOP RECORDER INSERTION: EP1214

## 2020-09-08 LAB — CBC
HCT: 40.9 % (ref 36.0–46.0)
Hemoglobin: 13.3 g/dL (ref 12.0–15.0)
MCH: 26.2 pg (ref 26.0–34.0)
MCHC: 32.5 g/dL (ref 30.0–36.0)
MCV: 80.5 fL (ref 80.0–100.0)
Platelets: 340 10*3/uL (ref 150–400)
RBC: 5.08 MIL/uL (ref 3.87–5.11)
RDW: 13.8 % (ref 11.5–15.5)
WBC: 9.3 10*3/uL (ref 4.0–10.5)
nRBC: 0 % (ref 0.0–0.2)

## 2020-09-08 LAB — GLUCOSE, CAPILLARY
Glucose-Capillary: 118 mg/dL — ABNORMAL HIGH (ref 70–99)
Glucose-Capillary: 119 mg/dL — ABNORMAL HIGH (ref 70–99)
Glucose-Capillary: 158 mg/dL — ABNORMAL HIGH (ref 70–99)
Glucose-Capillary: 91 mg/dL (ref 70–99)

## 2020-09-08 LAB — BASIC METABOLIC PANEL
Anion gap: 11 (ref 5–15)
BUN: 12 mg/dL (ref 8–23)
CO2: 22 mmol/L (ref 22–32)
Calcium: 9.6 mg/dL (ref 8.9–10.3)
Chloride: 107 mmol/L (ref 98–111)
Creatinine, Ser: 0.52 mg/dL (ref 0.44–1.00)
GFR, Estimated: >60 mL/min (ref >60)
Glucose, Bld: 118 mg/dL — ABNORMAL HIGH (ref 70–99)
Potassium: 3.7 mmol/L (ref 3.5–5.1)
Sodium: 140 mmol/L (ref 135–145)

## 2020-09-08 LAB — MAGNESIUM: Magnesium: 1.7 mg/dL (ref 1.7–2.4)

## 2020-09-08 SURGERY — LOOP RECORDER INSERTION

## 2020-09-08 MED ORDER — NICOTINE 14 MG/24HR TD PT24: 14.0000 mg | MEDICATED_PATCH | Freq: Every day | TRANSDERMAL | 0 refills | Status: DC

## 2020-09-08 MED ORDER — ASPIRIN 81 MG PO TBEC: 81.0000 mg | DELAYED_RELEASE_TABLET | Freq: Every day | ORAL | 11 refills | Status: DC

## 2020-09-08 MED ORDER — DOXYCYCLINE HYCLATE 100 MG PO TABS: 100.0000 mg | ORAL_TABLET | Freq: Two times a day (BID) | ORAL | 0 refills | Status: AC

## 2020-09-08 MED ORDER — LIDOCAINE-EPINEPHRINE 1 %-1:100000 IJ SOLN
INTRAMUSCULAR | Status: DC | PRN
  Administered 2020-09-08: 30 mL

## 2020-09-08 MED ORDER — LIDOCAINE-EPINEPHRINE 1 %-1:100000 IJ SOLN
INTRAMUSCULAR | Status: AC
  Filled 2020-09-08: qty 2

## 2020-09-08 MED ORDER — NICOTINE 14 MG/24HR TD PT24
14.0000 mg | MEDICATED_PATCH | Freq: Every day | TRANSDERMAL | Status: DC
  Administered 2020-09-08: 14 mg via TRANSDERMAL
  Filled 2020-09-08: qty 1

## 2020-09-08 MED ORDER — AZITHROMYCIN 250 MG PO TABS: 500.0000 mg | ORAL_TABLET | Freq: Every day | ORAL | Status: DC

## 2020-09-08 MED ORDER — CLOPIDOGREL BISULFATE 75 MG PO TABS
75.0000 mg | ORAL_TABLET | Freq: Every day | ORAL | 0 refills | Status: DC
Start: 2020-09-09 — End: 2020-10-26

## 2020-09-08 SURGICAL SUPPLY — 2 items
MONITOR REVEAL LINQ II (Prosthesis & Implant Heart) ×1 IMPLANT
PACK LOOP INSERTION (CUSTOM PROCEDURE TRAY) ×2 IMPLANT

## 2020-09-08 NOTE — Progress Notes (Addendum)
STROKE TEAM PROGRESS NOTE   SUBJECTIVE (INTERVAL HISTORY) Her significant other is at the bedside.  She is sitting in the chair drinking a cup of coffee. Her and her significant other tells [place] that he speaking is better and almost back to baseline. Patient updated on MRI B results with watershed strokes in the Left MCA/PCA territory with scattered remote infarcts probably cardio-embolic in nature. Dr. Pearlean Brownie updated them on next steps and potential loop recorder placement. Nicotine patch as been ordered, patient smokes about 1 ppd. She was advised to stop smoking.    OBJECTIVE Vitals:   09/07/20 0332 09/07/20 2057 09/08/20 0415 09/08/20 0945  BP: 114/88 134/72 140/75 137/69  Pulse: 85 81 83 87  Resp: 12 18 18 18   Temp: 98 F (36.7 C) 98.8 F (37.1 C) 98 F (36.7 C)   TempSrc:  Oral Oral   SpO2: 94% 95% 90% 98%  Weight:      Height:        CBC:  Recent Labs  Lab 09/06/20 1307 09/07/20 0448 09/08/20 0318  WBC 15.4* 14.9* 9.3  NEUTROABS 12.8*  --   --   HGB 14.4 13.2 13.3  HCT 44.4 41.3 40.9  MCV 81.5 81.1 80.5  PLT 403* 365 340    Basic Metabolic Panel:  Recent Labs  Lab 09/07/20 0448 09/08/20 0318  NA 140 140  K 3.3* 3.7  CL 106 107  CO2 23 22  GLUCOSE 142* 118*  BUN 9 12  CREATININE 0.58 0.52  CALCIUM 9.8 9.6  MG  --  1.7    Lipid Panel:  Recent Labs  Lab 09/07/20 0448  CHOL 127  TRIG 145  HDL 30*  CHOLHDL 4.2  VLDL 29  LDLCALC 68   HgbA1c:  Lab Results  Component Value Date   HGBA1C 8.6 (H) 09/06/2020   Urine Drug Screen:     Component Value Date/Time   LABOPIA NONE DETECTED 09/06/2020 1647   COCAINSCRNUR NONE DETECTED 09/06/2020 1647   LABBENZ POSITIVE (A) 09/06/2020 1647   AMPHETMU NONE DETECTED 09/06/2020 1647   THCU NONE DETECTED 09/06/2020 1647   LABBARB NONE DETECTED 09/06/2020 1647    Alcohol Level     Component Value Date/Time   ETH <10 09/06/2020 1307    IMAGING  Results for orders placed or performed during the hospital  encounter of 09/06/20  MR BRAIN WO CONTRAST   Narrative   CLINICAL DATA:  Neuro deficit, acute, stroke suspected  EXAM: MRI HEAD WITHOUT CONTRAST  TECHNIQUE: Multiplanar, multiecho pulse sequences of the brain and surrounding structures were obtained without intravenous contrast.  COMPARISON:  CT head 09/06/2020.  FINDINGS: Brain: Small cortical infarct in the posterior left parietal lobe. Slight edema without mass effect. Remote infarcts in the inferolateral left cerebellum, right perirolandic frontoparietal region, and right frontal white matter with associated encephalomalacia and gliosis. Moderate patchy confluent T2 hyperintensity in the supratentorial and pontine white matter, nonspecific but compatible with chronic microvascular ischemic disease. No evidence of acute hemorrhage, mass lesion, midline shift, extra-axial fluid collection, or hydrocephalus. Mild-to-moderate atrophy with ex vacuo ventricular dilation. Bilateral basal ganglia mineralization.  Vascular: Major arterial flow voids are maintained at the skull base.  Skull and upper cervical spine: Normal marrow signal.  Sinuses/Orbits: Largely clear sinuses. No evidence of acute orbital abnormality.  Other: No mastoid effusions.  IMPRESSION: 1. Small cortical infarct in the posterior left parietal lobe.Slight edema without mass effect. 2. Remote infarcts in the inferolateral left cerebellum, right perirolandic frontoparietal region,  and right frontal white matter. 3. Moderate chronic microvascular ischemic disease and mild to moderate atrophy.   Electronically Signed   By: Feliberto HartsFrederick S Jones M.D.   On: 09/07/2020 09:28   MR ANGIO HEAD WO CONTRAST   Narrative   CLINICAL DATA:  Follow-up examination for acute stroke.  EXAM: MRA HEAD WITHOUT CONTRAST  MRA NECK WITHOUT AND WITH CONTRAST  TECHNIQUE: Angiographic images of the Circle of Willis were acquired using MRA technique without intravenous  contrast. Angiographic images of the neck were acquired using MRA technique without and with intravenous contrast. Carotid stenosis measurements (when applicable) are obtained utilizing NASCET criteria, using the distal internal carotid diameter as the denominator.  CONTRAST:  7.5 cc of Gadavist.  COMPARISON:  Prior brain MRI from earlier the same day.  FINDINGS: MRA HEAD FINDINGS  Anterior circulation: Both internal carotid arteries patent to the termini without stenosis or other abnormality. A1 segments patent bilaterally. Right A1 slightly hypoplastic. Normal anterior communicating artery complex. Anterior cerebral arteries patent to their distal aspects without stenosis. No M1 stenosis or occlusion. Normal left MCA bifurcation. Evaluation of the right MCA bifurcation somewhat limited by motion, but grossly within normal limits. Distal MCA branches well perfused and symmetric.  Posterior circulation: Dominant left vertebral artery widely patent to the vertebrobasilar junction. Hypoplastic right vertebral artery largely terminates in PICA, although a tiny branches seen ascending towards the vertebrobasilar junction on 3D time-of-flight sequence. Both PICA patent. Basilar patent to its distal aspect without stenosis. Superior cerebellar arteries patent bilaterally. Left PCA supplied primarily via the basilar, although a tiny left PCOM is noted. Right PCA supplied via a hypoplastic right P1 segment and robust right posterior communicating artery. Both PCAs widely patent to their distal aspects without stenosis.  Anatomic variants: Hypoplastic right vertebral artery largely terminates in PICA. Mildly hypoplastic right A1 segment.  MRA NECK FINDINGS  Aortic arch: Visualized aortic arch normal in caliber with normal 3 vessel morphology. No hemodynamically significant stenosis seen about the origin of the great vessels.  Right carotid system: Right CCA patent from its origin to  the bifurcation without stenosis. Mild atheromatous irregularity about the right carotid bulb/proximal right ICA without significant stenosis. Right ICA tortuous but otherwise patent without stenosis, evidence for dissection or occlusion.  Left carotid system: Left CCA patent from its origin to the bifurcation without stenosis. Mild atheromatous irregularity about the left carotid bulb/proximal left ICA without significant stenosis. Left ICA tortuous but widely patent distally without stenosis, evidence for dissection or occlusion.  Vertebral arteries: Both vertebral arteries arise from the subclavian arteries. No proximal subclavian artery stenosis. Left vertebral artery dominant, with a diffusely hypoplastic right vertebral artery. Vertebral arteries tortuous but are widely patent without stenosis, evidence for dissection or occlusion. Short-segment fenestration involving the left V3 segment noted (series 506, image 11).  Other: None  IMPRESSION: 1. Negative MRA of the head and neck. No large vessel occlusion, hemodynamically significant stenosis, or other acute vascular abnormality. 2. Mild for age atheromatous change about the carotid bifurcations without stenosis.   Electronically Signed   By: Rise MuBenjamin  McClintock M.D.   On: 09/07/2020 21:22   CT Head Wo Contrast   Narrative   CLINICAL DATA:  Delirium  EXAM: CT HEAD WITHOUT CONTRAST  TECHNIQUE: Contiguous axial images were obtained from the base of the skull through the vertex without intravenous contrast.  COMPARISON:  None.  FINDINGS: Brain: No evidence of acute infarction, hemorrhage, extra-axial collection, ventriculomegaly, or mass effect. Generalized cerebral atrophy. Periventricular white  matter low attenuation likely secondary to microangiopathy.  Vascular: Cerebrovascular atherosclerotic calcifications are noted.  Skull: Negative for fracture or focal lesion.  Sinuses/Orbits: Visualized portions  of the orbits are unremarkable. Visualized portions of the paranasal sinuses are unremarkable. Visualized portions of the mastoid air cells are unremarkable.  Other: None.  IMPRESSION: 1. No acute intracranial pathology. 2. Chronic microvascular disease and cerebral atrophy.   Electronically Signed   By: Elige Ko M.D.   On: 09/06/2020 13:47        PHYSICAL EXAM  Temp:  [98 F (36.7 C)-98.8 F (37.1 C)] 98 F (36.7 C) (08/26 0415) Pulse Rate:  [81-87] 87 (08/26 0945) Resp:  [18] 18 (08/26 0945) BP: (134-140)/(69-75) 137/69 (08/26 0945) SpO2:  [90 %-98 %] 98 % (08/26 0945)  General - Well nourished, well developed pleasant elderly Caucasian lady, in no apparent distress.  Ophthalmologic - normal sclera  Cardiovascular - Regular rhythm and rate.  Mental Status -  Level of arousal and orientation to time, place, and person were intact. Language- speech is a little slow and nonfluent with occasional word finding difficulties.  No paraphasic errors., improved from yesterday. Expression, naming, repetition, comprehension was assessed and found intact. Attention span and concentration were normal. Recent and remote memory were intact. Fund of Knowledge was assessed and was intact.  Cranial Nerves II - XII - II - Visual field intact OU. III, IV, VI - Extraocular movements intact. V - Facial sensation intact bilaterally. VII - Facial movement intact bilaterally. VIII - Hearing diminished significantly bilaterally at baseline  X - Palate elevates symmetrically. XI - Chin turning & shoulder shrug intact bilaterally. XII - Tongue protrusion intact.  Motor Strength - The patient's strength was normal in all extremities and pronator drift was absent.  Bulk was normal and fasciculations were absent.   Motor Tone - Muscle tone was assessed at the neck and appendages and was normal.  Sensory - Light touch, temperature/pinprick were assessed and were symmetrical.     Coordination - The patient had normal movements in the hands and feet with no ataxia or dysmetria.  Bilateral upper arm Tremors  Gait and Station - deferred.    ASSESSMENT/PLAN Allison Hughes is a 70 y.o. female with history of uncontrolled diabetes mellitus, hypertension, dyslipidemia presented to the ED for confusion and  expressive aphasia. Per notes, LKW 09/05/20, neighbor noted symptoms and as states patient complained of a headache. On 8/24, neighbor went to check on her and she was still altered and summoned EMS.  On arrival she was only able to state her name.   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 Chronic microvascular disease and cerebral atrophy MRI head  8/25 -Small cortical infarct in the posterior left parietal lobe.Slight edema without mass effect. 2. Remote infarcts in the inferolateral left cerebellum, right perirolandic frontoparietal region, and right frontal white matter. 3. Moderate chronic microvascular ischemic disease and mild to moderate atrophy. MRA head /neck 8/25: No large vessel occlusion, hemodynamically significant stenosis, or other acute vascular abnormality. 2. Mild for age atheromatous change about the carotid bifurcations without stenosis. 2D Echo  - EF 60-65%. No atrial shunt. LDL 68 HgbA1c 8.6 SCD's/Lovenox for VTE prophylaxis Diet Order             Diet regular Room service appropriate? Yes with Assist; Fluid consistency: Thin  Diet effective now  No antithrombotic prior to admission, now on aspirin 81 mg daily and clopidogrel 75 mg daily, for 3 weeks than ASA alone Patient counseled to be compliant with her antithrombotic medications Ongoing aggressive stroke risk factor management Therapy recommendations:  Outpatient SLP, NO PT or OT needs Disposition:  home  Hypertension, essential   Stable EKG NSR. Consult for loop recorder  Home lisnopril 20mg  daily , on hold currently, restart as appropriate Permissive hypertension (OK if < 220/120) but gradually normalize in 5-7 days Long-term BP goal normotensive  Hyperlipidemia Home meds:   atorvastatin 40mg  daily, resumed in hospital  LDL 68, goal < 70 Continue statin at discharge  Diabetes type II HgbA1c 8.6, goal < 7.0 UnControlled, on metformin 500mg  daily at home, on hold here  SSI  Other Stroke Risk Factors Advanced age Cigarette smoker advised to stop smoking-nicotine patch ordered  ETOH use, advised to drink no more than 1 drink(s) a day Hx stroke/TIA   Other Active Problems PNA on ceftriaxone and azithromycin per primary team   Neurology will sign off. Please call with questions or concerns   Hospital day # 2  , NP   STROKE MD NOTE :  I have personally obtained history,examined this patient, reviewed notes, independently viewed imaging studies, participated in medical decision making and plan of care.ROS completed by me personally and pertinent positives fully documented  I have made any additions or clarifications directly to the above note. Agree with note above.  Patient presented with transient expressive aphasia which appears to be improving and MRI scan shows embolic left parietal infarct as well as several old left cerebellar and right frontal and insular embolic infarcts as well raising strong suspicion for paroxysmal A. fib or cardiac source of embolism.  Recommend dual antiplatelet therapy of aspirin Plavix for 3 weeks followed by aspirin alone and aggressive risk factor modification.  Loop recorder for paroxysmal A. fib upon discharge.  Long discussion with patient and her significant other as well as with Dr. and answered questions.  Greater than 50% time during this 35-minute visit was spent on counseling coordination of care and discussion with care team.  ,  MD Medical Director [facility] Pager: [phone] 09/08/2020 1:16 PM  To contact Stroke Continuity provider, please refer to [facility]. After hours, contact General Neurology

## 2020-09-08 NOTE — TOC Transition Note (Signed)
Transition of Care [facility]) - CM/SW Discharge Note   Patient Details  Name: Allison Hughes MRN: [id number] Date of Birth: 10-Oct-1950  [facility] CM/SW Contact:  Lorri Frederick, LCSW Phone Number: 09/08/2020, 2:38 PM   Clinical Narrative:   Pt discharging home with outpt speech therapy at [facility] center.  Significant other, Aneta Mins to transport home.  No other needs identified.     Final next level of care: Home/Self Care Barriers to Discharge: No Barriers Identified   Patient Goals and CMS Choice Patient states their goals for this hospitalization and ongoing recovery are:: "keep on getting better" CMS Medicare.gov Compare Post Acute Care list provided to::  (NA)    Discharge Placement                       Discharge Plan and Services     Post Acute Care Choice: NA          DME Arranged: N/A         HH Arranged: NA          Social Determinants of Health (SDOH) Interventions     Readmission Risk Interventions No flowsheet data found.

## 2020-09-08 NOTE — Progress Notes (Signed)
Occupational Therapy Treatment Patient Details Name: Allison Hughes MRN: [id number] DOB: 1950/12/04 Today's Date: 09/08/2020    History of present illness 70 y.o. female presenting to ED 8/24 via EMS with AMS and expressive aphasia after a neighbor called 911. CXR with concern for L middle lobe pneumonia. CT head (-) for acute findings. MRI pending. PMHx significant for DMII, HTN and DLD.   OT comments  Patient met washing face standing at sink level. Session with focus on higher level cognition in prep for safe return home. Although patient reports boyfriend assists with med management, pillbox test used to assess executive function, reading comprehension, and [place]. Noted tremor in bilateral UE but patient able to manage all containers without much difficulty. Patient completed pillbox test in >10 min with 0 errors. Discussed safety with ADLs and assist for IADLs at d/c. Patient expressed verbal understanding. Allison Hughes (patient's boyfriend) entered at conclusion of session and confirmed ability to continue to assist with IADLs. OT will continue to follow acutely.    Follow Up Recommendations  No OT follow up;Supervision/Assistance - 24 hour (Assist with med management, meal prep and money management)    Equipment Recommendations  None recommended by OT    Recommendations for Other Services      Precautions / Restrictions Precautions Precautions: Fall Precaution Comments: HOH       Mobility Bed Mobility Overal bed mobility: Independent                  Transfers Overall transfer level: Modified independent                    Balance Overall balance assessment: Independent                                         ADL either performed or assessed with clinical judgement   ADL Overall ADL's : Needs assistance/impaired     Grooming: Supervision/safety;Standing Grooming Details (indicate cue type and reason): 2/3 grooming tasks standing at sink level  with supervision A for safety.             Lower Body Dressing: Min guard;Sit to/from stand   Toilet Transfer: Magazine features editor Details (indicate cue type and reason): Min guard for steadying. Toileting- Water quality scientist and Hygiene: Min guard Toileting - Clothing Manipulation Details (indicate cue type and reason): Min guard for steadying.     Functional mobility during ADLs: Min guard       Vision   Vision Assessment?: No apparent visual deficits   Perception     Praxis      Cognition Arousal/Alertness: Awake/alert Behavior During Therapy: WFL for tasks assessed/performed Overall Cognitive Status: Impaired/Different from baseline Area of Impairment: Safety/judgement;Awareness;Problem solving                         Safety/Judgement: Decreased awareness of safety Awareness: Emergent Problem Solving: Slow processing General Comments: Patient A&Ox4; follows 1-step verbal commands with good accuracy; requires increased time to process verbal information; decreased awareness for safety requiring cues; scored 8/28 on SBT indicating questionable cognitive impairment.        Exercises     Shoulder Instructions       General Comments      Pertinent Vitals/ Pain       Pain Assessment: No/denies pain  Home Living  Prior Functioning/Environment              Frequency  Min 2X/week        Progress Toward Goals  OT Goals(current goals can now be found in the care plan section)  Progress towards OT goals: Progressing toward goals  Acute Rehab OT Goals Patient Stated Goal: To return home. OT Goal Formulation: With patient Time For Goal Achievement: 09/21/20 Potential to Achieve Goals: Good ADL Goals Additional ADL Goal #1: Patient will complete ADLs with I in prep for safe d/c home. Additional ADL Goal #2: Patient will score <4/28 on SBT indicating improved cognition in  prep for safe return home. Additional ADL Goal #3: Patient will complete ADL transfers with I in prep for safe return home.  Plan Discharge plan remains appropriate;Frequency remains appropriate    Co-evaluation                 AM-PAC OT "6 Clicks" Daily Activity     Outcome Measure   Help from another person eating meals?: None Help from another person taking care of personal grooming?: None Help from another person toileting, which includes using toliet, bedpan, or urinal?: None Help from another person bathing (including washing, rinsing, drying)?: A Little Help from another person to put on and taking off regular upper body clothing?: None Help from another person to put on and taking off regular lower body clothing?: None 6 Click Score: 23    End of Session    OT Visit Diagnosis: Unsteadiness on feet (R26.81);Other symptoms and signs involving cognitive function   Activity Tolerance Patient tolerated treatment well   Patient Left in chair;with call bell/phone within reach   Nurse Communication Mobility status        Time: 7366-8159 OT Time Calculation (min): 21 min  Charges: OT General Charges $OT Visit: 1 Visit OT Treatments $Therapeutic Activity: 8-22 mins  Allison Yohe H. OTR/L Supplemental OT, Department of rehab services [phone]   Allison Reiland R H. 09/08/2020, 9:34 AM

## 2020-09-08 NOTE — Discharge Instructions (Signed)
Post procedure wound care instructions Keep incision clean and dry for 3 days. You can remove outer dressing tomorrow. Leave steri-strips (little pieces of tape) on until seen in the office for wound check appointment. Call the office ([phone]) for redness, drainage, swelling, or fever.  

## 2020-09-08 NOTE — Consult Note (Addendum)
ELECTROPHYSIOLOGY CONSULT NOTE  Patient ID: Allison Hughes MRN: [id number], DOB/AGE: July 09, 1950   Admit date: 09/06/2020 Date of Consult: 09/08/2020  Primary Physician: Ralene Ok, MD Primary Cardiologist: none Reason for Consultation: Cryptogenic stroke; recommendations regarding Implantable Loop Recorder, requested by Dr.Sethi  History of Present Illness Allison Hughes was admitted on 09/06/2020 with headache AMS, stroke.   She is also on antibiotics for possible aspiration pneumonia, WBC ws wnl, she is afebrile  PMHx includes: DM (poorly controlled), HTN, HLD  Neurology notes: left PCA/MCA watershed infarct embolic secondary to probable A fib and small vessel disease .  she has undergone workup for stroke including echocardiogram and carotid dopplers.  The patient has been monitored on telemetry which has demonstrated sinus rhythm with no arrhythmias.   Neurology has deferred TEE  Echocardiogram this admission demonstrated    IMPRESSIONS   1. Left ventricular ejection fraction, by estimation, is 60 to 65%. The  left ventricle has normal function. The left ventricle has no regional  wall motion abnormalities. Left ventricular diastolic parameters were  normal.   2. Right ventricular systolic function is normal. The right ventricular  size is normal.   3. The mitral valve is normal in structure. No evidence of mitral valve  regurgitation. No evidence of mitral stenosis.   4. The aortic valve is tricuspid. Aortic valve regurgitation is not  visualized. Mild aortic valve sclerosis is present, with no evidence of  aortic valve stenosis.   5. The inferior vena cava is normal in size with greater than 50%  respiratory variability, suggesting right atrial pressure of 3 mmHg.   Comparison(s): No prior Echocardiogram.   Lab work is reviewed.   Prior to admission, the patient denies chest pain, shortness of breath, dizziness, palpitations, or syncope.  They are recovering from  their stroke with plans to home at discharge.   Past Medical History:  Diagnosis Date   Diabetes mellitus 2006   T2DM   Hyperlipidemia    Hypertension    Obesity (BMI 30-39.9)      Surgical History:  Past Surgical History:  Procedure Laterality Date   CESAREAN SECTION     ORIF ANKLE FRACTURE  05/11/2011   Procedure: OPEN REDUCTION INTERNAL FIXATION (ORIF) ANKLE FRACTURE;  Surgeon: Nadara Mustard, MD;  Location: [facility] OR;  Service: Orthopedics;  Laterality: Right;   TUBAL LIGATION       Medications Prior to Admission  Medication Sig Dispense Refill Last Dose   ALPRAZolam (XANAX) 1 MG tablet Take 0.5-1 mg by mouth 2 (two) times daily as needed. For anxiety and sleep.   unk   atorvastatin (LIPITOR) 40 MG tablet Take 40 mg by mouth daily.   Past Week   lisinopril (PRINIVIL,ZESTRIL) 20 MG tablet Take 20 mg by mouth daily.   Past Week   metFORMIN (GLUCOPHAGE) 500 MG tablet Take 1 tablet (500 mg total) by mouth daily with breakfast. 90 tablet 3 Past Week   MYRBETRIQ 50 MG TB24 tablet Take 50 mg by mouth daily.   Past Week   Omega-3 Fatty Acids (OMEGA 3 500 PO) Take 1 capsule by mouth daily.   Past Week   TRESIBA FLEXTOUCH 100 UNIT/ML FlexTouch Pen Inject 15 Units into the skin daily.   Past Week   triamcinolone cream (KENALOG) 0.1 % Apply 1 application topically 3 (three) times daily.   Past Week   vitamin B-12 (CYANOCOBALAMIN) 1000 MCG tablet Take 1,000 mcg by mouth daily.     Past Week  Inpatient Medications:    stroke: mapping our early stages of recovery book   Does not apply Once   aspirin EC  81 mg Oral Daily   atorvastatin  40 mg Oral QHS   azithromycin  500 mg Oral q1800   clopidogrel  75 mg Oral Daily   enoxaparin (LOVENOX) injection  40 mg Subcutaneous Q24H   insulin aspart  0-15 Units Subcutaneous Q4H   mirabegron ER  25 mg Oral Daily   nicotine  14 mg Transdermal Daily   sodium chloride flush  3 mL Intravenous Q12H   vitamin B-12  1,000 mcg Oral Daily    Allergies:   Allergies  Allergen Reactions   Iodine Swelling   Shrimp [Shellfish Allergy] Swelling    Fresh shrimp    Social History   Socioeconomic History   Marital status: Divorced    Spouse name: Not on file   Number of children: Not on file   Years of education: Not on file   Highest education level: Not on file  Occupational History   Not on file  Tobacco Use   Smoking status: Every Day    Packs/day: 1.00    Years: 40.00    Pack years: 40.00    Types: Cigarettes   Smokeless tobacco: Former    Quit date: 05/11/2011  Substance and Sexual Activity   Alcohol use: No   Drug use: No   Sexual activity: Never  Other Topics Concern   Not on file  Social History Narrative   Not on file   Social Determinants of Health   Financial Resource Strain: Not on file  Food Insecurity: Not on file  Transportation Needs: Not on file  Physical Activity: Not on file  Stress: Not on file  Social Connections: Not on file  Intimate Partner Violence: Not on file     Family History  Problem Relation Age of Onset   Drug abuse Daughter    Diabetes Daughter       Review of Systems: All other systems reviewed and are otherwise negative except as noted above.  Physical Exam: Vitals:   09/07/20 0332 09/07/20 2057 09/08/20 0415 09/08/20 0945  BP: 114/88 134/72 140/75 137/69  Pulse: 85 81 83 87  Resp: 12 18 18 18   Temp: 98 F (36.7 C) 98.8 F (37.1 C) 98 F (36.7 C)   TempSrc:  Oral Oral   SpO2: 94% 95% 90% 98%  Weight:      Height:        GEN- The patient is well appearing, alert and oriented x 3 today.   Head- normocephalic, atraumatic Eyes-  Sclera clear, conjunctiva pink Ears- hearing intact Oropharynx- clear Neck- supple Lungs- Clear to ausculation bilaterally, normal work of breathing Heart- Regular rate and rhythm, no murmurs, rubs or gallops  GI- soft, NT, ND, + BS Extremities- no clubbing, cyanosis, or edema MS- no significant deformity or atrophy Skin- no rash or  lesion Psych- euthymic mood, full affect   Labs:   Lab Results  Component Value Date   WBC 9.3 09/08/2020   HGB 13.3 09/08/2020   HCT 40.9 09/08/2020   MCV 80.5 09/08/2020   PLT 340 09/08/2020    Recent Labs  Lab 09/06/20 1307 09/07/20 0448 09/08/20 0318  NA 137   < > 140  K 3.9   < > 3.7  CL 103   < > 107  CO2 25   < > 22  BUN 12   < > 12  CREATININE 0.68   < > 0.52  CALCIUM 9.3   < > 9.6  PROT 7.2  --   --   BILITOT 0.8  --   --   ALKPHOS 97  --   --   ALT 17  --   --   AST 16  --   --   GLUCOSE 149*   < > 118*   < > = values in this interval not displayed.   No results found for: CKTOTAL, CKMB, CKMBINDEX, TROPONINI Lab Results  Component Value Date   CHOL 127 09/07/2020   Lab Results  Component Value Date   HDL 30 (L) 09/07/2020   Lab Results  Component Value Date   LDLCALC 68 09/07/2020   Lab Results  Component Value Date   TRIG 145 09/07/2020   Lab Results  Component Value Date   CHOLHDL 4.2 09/07/2020   No results found for: LDLDIRECT  No results found for: DDIMER   Radiology/Studies:  DG Abdomen 1 View Result Date: 09/06/2020 CLINICAL DATA:  Metal screening for MRI EXAM: ABDOMEN - 1 VIEW COMPARISON:  None. FINDINGS: Some form of battery pack projects over the right lower chest. Recommend clinical correlation. No radiopaque foreign bodies in the abdomen or pelvis. Normal bowel gas pattern. No organomegaly or free air. IMPRESSION: Battery pack projects over the right chest. Recommend clinical correlation. No radiopaque foreign bodies in the abdomen or pelvis. Electronically Signed   By: Charlett Nose M.D.   On: 09/06/2020 17:48   CT Head Wo Contrast Result Date: 09/06/2020 CLINICAL DATA:  Delirium EXAM: CT HEAD WITHOUT CONTRAST TECHNIQUE: Contiguous axial images were obtained from the base of the skull through the vertex without intravenous contrast. COMPARISON:  None. FINDINGS: Brain: No evidence of acute infarction, hemorrhage, extra-axial  collection, ventriculomegaly, or mass effect. Generalized cerebral atrophy. Periventricular white matter low attenuation likely secondary to microangiopathy. Vascular: Cerebrovascular atherosclerotic calcifications are noted. Skull: Negative for fracture or focal lesion. Sinuses/Orbits: Visualized portions of the orbits are unremarkable. Visualized portions of the paranasal sinuses are unremarkable. Visualized portions of the mastoid air cells are unremarkable. Other: None. IMPRESSION: 1. No acute intracranial pathology. 2. Chronic microvascular disease and cerebral atrophy. Electronically Signed   By: Elige Ko M.D.   On: 09/06/2020 13:47   MR ANGIO HEAD WO CONTRAST Result Date: 09/07/2020 CLINICAL DATA:  Follow-up examination for acute stroke. EXAM: MRA HEAD WITHOUT CONTRAST MRA NECK WITHOUT AND WITH CONTRAST TECHNIQUE: Angiographic images of the Circle of Willis were acquired using MRA technique without intravenous contrast. Angiographic images of the neck were acquired using MRA technique without and with intravenous contrast. Carotid stenosis measurements (when applicable) are obtained utilizing NASCET criteria, using the distal internal carotid diameter as the denominator. CONTRAST:  7.5 cc of Gadavist. COMPARISON:  Prior brain MRI from earlier the same day. FINDINGS: MRA HEAD FINDINGS Anterior circulation: Both internal carotid arteries patent to the termini without stenosis or other abnormality. A1 segments patent bilaterally. Right A1 slightly hypoplastic. Normal anterior communicating artery complex. Anterior cerebral arteries patent to their distal aspects without stenosis. No M1 stenosis or occlusion. Normal left MCA bifurcation. Evaluation of the right MCA bifurcation somewhat limited by motion, but grossly within normal limits. Distal MCA branches well perfused and symmetric. Posterior circulation: Dominant left vertebral artery widely patent to the vertebrobasilar junction. Hypoplastic right  vertebral artery largely terminates in PICA, although a tiny branches seen ascending towards the vertebrobasilar junction on 3D time-of-flight sequence. Both PICA patent. Basilar patent  to its distal aspect without stenosis. Superior cerebellar arteries patent bilaterally. Left PCA supplied primarily via the basilar, although a tiny left PCOM is noted. Right PCA supplied via a hypoplastic right P1 segment and robust right posterior communicating artery. Both PCAs widely patent to their distal aspects without stenosis. Anatomic variants: Hypoplastic right vertebral artery largely terminates in PICA. Mildly hypoplastic right A1 segment. MRA NECK FINDINGS Aortic arch: Visualized aortic arch normal in caliber with normal 3 vessel morphology. No hemodynamically significant stenosis seen about the origin of the great vessels. Right carotid system: Right CCA patent from its origin to the bifurcation without stenosis. Mild atheromatous irregularity about the right carotid bulb/proximal right ICA without significant stenosis. Right ICA tortuous but otherwise patent without stenosis, evidence for dissection or occlusion. Left carotid system: Left CCA patent from its origin to the bifurcation without stenosis. Mild atheromatous irregularity about the left carotid bulb/proximal left ICA without significant stenosis. Left ICA tortuous but widely patent distally without stenosis, evidence for dissection or occlusion. Vertebral arteries: Both vertebral arteries arise from the subclavian arteries. No proximal subclavian artery stenosis. Left vertebral artery dominant, with a diffusely hypoplastic right vertebral artery. Vertebral arteries tortuous but are widely patent without stenosis, evidence for dissection or occlusion. Short-segment fenestration involving the left V3 segment noted (series 506, image 11). Other: None IMPRESSION: 1. Negative MRA of the head and neck. No large vessel occlusion, hemodynamically significant  stenosis, or other acute vascular abnormality. 2. Mild for age atheromatous change about the carotid bifurcations without stenosis. Electronically Signed   By: Rise Mu M.D.   On: 09/07/2020 21:22    MR BRAIN WO CONTRAST Result Date: 09/07/2020 CLINICAL DATA:  Neuro deficit, acute, stroke suspected EXAM: MRI HEAD WITHOUT CONTRAST TECHNIQUE: Multiplanar, multiecho pulse sequences of the brain and surrounding structures were obtained without intravenous contrast. COMPARISON:  CT head 09/06/2020. FINDINGS: Brain: Small cortical infarct in the posterior left parietal lobe. Slight edema without mass effect. Remote infarcts in the inferolateral left cerebellum, right perirolandic frontoparietal region, and right frontal white matter with associated encephalomalacia and gliosis. Moderate patchy confluent T2 hyperintensity in the supratentorial and pontine white matter, nonspecific but compatible with chronic microvascular ischemic disease. No evidence of acute hemorrhage, mass lesion, midline shift, extra-axial fluid collection, or hydrocephalus. Mild-to-moderate atrophy with ex vacuo ventricular dilation. Bilateral basal ganglia mineralization. Vascular: Major arterial flow voids are maintained at the skull base. Skull and upper cervical spine: Normal marrow signal. Sinuses/Orbits: Largely clear sinuses. No evidence of acute orbital abnormality. Other: No mastoid effusions. IMPRESSION: 1. Small cortical infarct in the posterior left parietal lobe.Slight edema without mass effect. 2. Remote infarcts in the inferolateral left cerebellum, right perirolandic frontoparietal region, and right frontal white matter. 3. Moderate chronic microvascular ischemic disease and mild to moderate atrophy. Electronically Signed   By: Feliberto Harts M.D.   On: 09/07/2020 09:28    12-lead ECG SR All prior EKG's in EPIC reviewed with no documented atrial fibrillation  Telemetry SR, long periods of significant artifact  and lead loss as well  Assessment and Plan:  1. Cryptogenic stroke The patient presents with cryptogenic stroke.  I spoke at length with the patient about monitoring for afib with either a 30 day event monitor or an implantable loop recorder.  Risks, benefits, and alteratives to implantable loop recorder were discussed with the patient today.   At this time, the patient is very clear in her decision to proceed with implantable loop recorder.   Wound care was  reviewed with the patient (keep incision clean and dry for 3 days).  Wound check follow up Wadie Mattie be scheduled for the patient.  I have urged the patient to quit smoking, she is very receptive  Please call with questions.   Allison PigeonRenee Lynn Ursuy, PA-C 09/08/2020  I have seen and examined this patient with Francis Dowseenee Ursuy.  Agree with above, note added to reflect my findings.  On exam, RRR, no murmurs.  Patient presented to the hospital with cryptogenic stroke. To date, no cause has been found. Jannell Franta plan for LINQ monitor to look for atrial fibrillation. Risks and benefits discussed. Risks include but not limited to bleeding and infection. The patient understands the risks and has agreed to the procedure.  Barnes Florek M. Loki Wuthrich MD 09/08/2020 2:25 PM

## 2020-09-08 NOTE — Discharge Summary (Signed)
Physician Discharge Summary  Allison Hughes WUJ:[id number] DOB: 1950/07/07 DOA: 09/06/2020  PCP: Allison Ok, MD  Admit date: 09/06/2020 Discharge date: 09/08/2020  Admitted From: Home  Discharge disposition: Home  Recommendations for Outpatient Follow-Up:   Follow up with your primary care provider in one week.  Check CBC, BMP, magnesium in the next visit Follow-up with [facility] as outpatient. Please consider repeat chest x-ray in 4 to 6 weeks for possible pneumonia noted on the x-ray on admission.  Discharge Diagnosis:   Principal Problem:   Pneumonia Active Problems:   Essential hypertension   Type 2 diabetes mellitus without complication, without long-term current use of insulin (HCC)   CVA (cerebral vascular accident) [facility])  Discharge Condition: Improved.  Diet recommendation: Low sodium, heart healthy.  Carbohydrate-modified.    Wound care: None.  Code status: Full.   History of Present Illness:   Allison Hughes is a 70 y.o. female with medical history significant for diabetes mellitus, hypertension, dyslipidemia presented to hospital after being confused and having difficulty expressing words..  Patient was brought in by the EMS after being called by the neighbor who stated that patient had complained of some headache.  Last known well was 09/05/20 at about noon.  In the ED, vitals were stable.  Laboratory data was essentially within normal range except for mild leukocytosis.  CT head scan without any acute findings but chest x-ray showed patchy left basilar opacity.  EKG showed normal sinus rhythm.  Patient was given antibiotic and neurology was consulted.  Neurology requested MRI scan and the patient was admitted to hospital for further evaluation and treatment  Hospital Course:   Following conditions were addressed during hospitalization as listed below,  Pneumonia Patient received Rocephin and Zithromax for possible aspiration pneumonia.   Will be prescribed 3 more days of doxycycline on discharge.  Speech and swallow evaluation recommended outpatient speech therapy for stroke.      Acute CVA with expressive aphasia/confusion Improved at this time.  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  Patient was out of the tPA window.  CT it was negative for acute bleed.  MRI of the brain showed small cortical infarct in the posterior left parietal lobe with remote infarcts in the cerebellum frontoparietal region and white matter.  2D echocardiogram with preserved LV function..  Patient will continue aspirin Plavix and statins at discharge.  Plavix will be continued for 3 weeks only.  At this time, patient is awaiting for implantable loop recorder placement..  Seen by neurology today and patient is stable for disposition home.  Patient will follow-up with [facility] [place] in 4 to 6 weeks.  Diabetes mellitus type II. Kidney diabetic diet metformin and Tresiba at home  Hypertension Patient will resume lisinopril on discharge.  Hypokalemia.  Replenished and improved.  Disposition.  At this time, patient is stable for disposition home with outpatient PCP, neurology follow-up.  Medical Consultants:   Neurology Electrophysiology cardiology  Procedures:    Loop recorder insertion on 09/08/2020 Subjective:   Today, patient was seen and examined at bedside.  Patient feels much better today but denies any headache, nausea vomiting shortness of breath  Discharge Exam:   Vitals:   09/08/20 0945 09/08/20 1408  BP: 137/69 138/69  Pulse: 87 93  Resp: 18 16  Temp:  98.1 F (36.7 C)  SpO2: 98% 95%   Vitals:   09/07/20 2057 09/08/20 0415 09/08/20 0945 09/08/20 1408  BP: 134/72 140/75 137/69 138/69  Pulse: 81 83 87 93  Resp: Temp: 98.8 F (37.1 C) 98 F (36.7 C)  98.1 F (36.7 C)  TempSrc: Oral Oral    SpO2: 95% 90% 98% 95%  Weight:      Height:       General: Alert awake, not in obvious distress HENT: pupils equally  reacting to light,  No scleral pallor or icterus noted. Oral mucosa is moist.  Chest:  Clear breath sounds.  Diminished breath sounds bilaterally. No crackles or wheezes.  CVS: S1 &S2 heard. No murmur.  Regular rate and rhythm. Abdomen: Soft, nontender, nondistended.  Bowel sounds are heard.   Extremities: No cyanosis, clubbing or edema.  Peripheral pulses are palpable. Psych: Alert, awake and oriented, normal mood CNS:  No cranial nerve deficits.  Power equal in all extremities.   Skin: Warm and dry.  No rashes noted.  The results of significant diagnostics from this hospitalization (including imaging, microbiology, ancillary and laboratory) are listed below for reference.     Diagnostic Studies:   DG Abdomen 1 View  Result Date: 09/06/2020 CLINICAL DATA:  Metal screening for MRI EXAM: ABDOMEN - 1 VIEW COMPARISON:  None. FINDINGS: Some form of battery pack projects over the right lower chest. Recommend clinical correlation. No radiopaque foreign bodies in the abdomen or pelvis. Normal bowel gas pattern. No organomegaly or free air. IMPRESSION: Battery pack projects over the right chest. Recommend clinical correlation. No radiopaque foreign bodies in the abdomen or pelvis. Electronically Signed   By: Charlett Nose M.D.   On: 09/06/2020 17:48   CT Head Wo Contrast  Result Date: 09/06/2020 CLINICAL DATA:  Delirium EXAM: CT HEAD WITHOUT CONTRAST TECHNIQUE: Contiguous axial images were obtained from the base of the skull through the vertex without intravenous contrast. COMPARISON:  None. FINDINGS: Brain: No evidence of acute infarction, hemorrhage, extra-axial collection, ventriculomegaly, or mass effect. Generalized cerebral atrophy. Periventricular white matter low attenuation likely secondary to microangiopathy. Vascular: Cerebrovascular atherosclerotic calcifications are noted. Skull: Negative for fracture or focal lesion. Sinuses/Orbits: Visualized portions of the orbits are unremarkable.  Visualized portions of the paranasal sinuses are unremarkable. Visualized portions of the mastoid air cells are unremarkable. Other: None. IMPRESSION: 1. No acute intracranial pathology. 2. Chronic microvascular disease and cerebral atrophy. Electronically Signed   By: Elige Ko M.D.   On: 09/06/2020 13:47   MR ANGIO HEAD WO CONTRAST  Result Date: 09/07/2020 CLINICAL DATA:  Follow-up examination for acute stroke. EXAM: MRA HEAD WITHOUT CONTRAST MRA NECK WITHOUT AND WITH CONTRAST TECHNIQUE: Angiographic images of the Circle of Willis were acquired using MRA technique without intravenous contrast. Angiographic images of the neck were acquired using MRA technique without and with intravenous contrast. Carotid stenosis measurements (when applicable) are obtained utilizing NASCET criteria, using the distal internal carotid diameter as the denominator. CONTRAST:  7.5 cc of Gadavist. COMPARISON:  Prior brain MRI from earlier the same day. FINDINGS: MRA HEAD FINDINGS Anterior circulation: Both internal carotid arteries patent to the termini without stenosis or other abnormality. A1 segments patent bilaterally. Right A1 slightly hypoplastic. Normal anterior communicating artery complex. Anterior cerebral arteries patent to their distal aspects without stenosis. No M1 stenosis or occlusion. Normal left MCA bifurcation. Evaluation of the right MCA bifurcation somewhat limited by motion, but grossly within normal limits. Distal MCA branches well perfused and symmetric. Posterior circulation: Dominant left vertebral artery widely patent to the vertebrobasilar junction. Hypoplastic right vertebral artery largely terminates in PICA, although a tiny branches seen ascending towards the vertebrobasilar junction  on 3D time-of-flight sequence. Both PICA patent. Basilar patent to its distal aspect without stenosis. Superior cerebellar arteries patent bilaterally. Left PCA supplied primarily via the basilar, although a tiny left  PCOM is noted. Right PCA supplied via a hypoplastic right P1 segment and robust right posterior communicating artery. Both PCAs widely patent to their distal aspects without stenosis. Anatomic variants: Hypoplastic right vertebral artery largely terminates in PICA. Mildly hypoplastic right A1 segment. MRA NECK FINDINGS Aortic arch: Visualized aortic arch normal in caliber with normal 3 vessel morphology. No hemodynamically significant stenosis seen about the origin of the great vessels. Right carotid system: Right CCA patent from its origin to the bifurcation without stenosis. Mild atheromatous irregularity about the right carotid bulb/proximal right ICA without significant stenosis. Right ICA tortuous but otherwise patent without stenosis, evidence for dissection or occlusion. Left carotid system: Left CCA patent from its origin to the bifurcation without stenosis. Mild atheromatous irregularity about the left carotid bulb/proximal left ICA without significant stenosis. Left ICA tortuous but widely patent distally without stenosis, evidence for dissection or occlusion. Vertebral arteries: Both vertebral arteries arise from the subclavian arteries. No proximal subclavian artery stenosis. Left vertebral artery dominant, with a diffusely hypoplastic right vertebral artery. Vertebral arteries tortuous but are widely patent without stenosis, evidence for dissection or occlusion. Short-segment fenestration involving the left V3 segment noted (series 506, image 11). Other: None IMPRESSION: 1. Negative MRA of the head and neck. No large vessel occlusion, hemodynamically significant stenosis, or other acute vascular abnormality. 2. Mild for age atheromatous change about the carotid bifurcations without stenosis. Electronically Signed   By: Rise Mu M.D.   On: 09/07/2020 21:22   MR ANGIO NECK W WO CONTRAST  Result Date: 09/07/2020 CLINICAL DATA:  Follow-up examination for acute stroke. EXAM: MRA HEAD WITHOUT  CONTRAST MRA NECK WITHOUT AND WITH CONTRAST TECHNIQUE: Angiographic images of the Circle of Willis were acquired using MRA technique without intravenous contrast. Angiographic images of the neck were acquired using MRA technique without and with intravenous contrast. Carotid stenosis measurements (when applicable) are obtained utilizing NASCET criteria, using the distal internal carotid diameter as the denominator. CONTRAST:  7.5 cc of Gadavist. COMPARISON:  Prior brain MRI from earlier the same day. FINDINGS: MRA HEAD FINDINGS Anterior circulation: Both internal carotid arteries patent to the termini without stenosis or other abnormality. A1 segments patent bilaterally. Right A1 slightly hypoplastic. Normal anterior communicating artery complex. Anterior cerebral arteries patent to their distal aspects without stenosis. No M1 stenosis or occlusion. Normal left MCA bifurcation. Evaluation of the right MCA bifurcation somewhat limited by motion, but grossly within normal limits. Distal MCA branches well perfused and symmetric. Posterior circulation: Dominant left vertebral artery widely patent to the vertebrobasilar junction. Hypoplastic right vertebral artery largely terminates in PICA, although a tiny branches seen ascending towards the vertebrobasilar junction on 3D time-of-flight sequence. Both PICA patent. Basilar patent to its distal aspect without stenosis. Superior cerebellar arteries patent bilaterally. Left PCA supplied primarily via the basilar, although a tiny left PCOM is noted. Right PCA supplied via a hypoplastic right P1 segment and robust right posterior communicating artery. Both PCAs widely patent to their distal aspects without stenosis. Anatomic variants: Hypoplastic right vertebral artery largely terminates in PICA. Mildly hypoplastic right A1 segment. MRA NECK FINDINGS Aortic arch: Visualized aortic arch normal in caliber with normal 3 vessel morphology. No hemodynamically significant stenosis  seen about the origin of the great vessels. Right carotid system: Right CCA patent from its origin to the  bifurcation without stenosis. Mild atheromatous irregularity about the right carotid bulb/proximal right ICA without significant stenosis. Right ICA tortuous but otherwise patent without stenosis, evidence for dissection or occlusion. Left carotid system: Left CCA patent from its origin to the bifurcation without stenosis. Mild atheromatous irregularity about the left carotid bulb/proximal left ICA without significant stenosis. Left ICA tortuous but widely patent distally without stenosis, evidence for dissection or occlusion. Vertebral arteries: Both vertebral arteries arise from the subclavian arteries. No proximal subclavian artery stenosis. Left vertebral artery dominant, with a diffusely hypoplastic right vertebral artery. Vertebral arteries tortuous but are widely patent without stenosis, evidence for dissection or occlusion. Short-segment fenestration involving the left V3 segment noted (series 506, image 11). Other: None IMPRESSION: 1. Negative MRA of the head and neck. No large vessel occlusion, hemodynamically significant stenosis, or other acute vascular abnormality. 2. Mild for age atheromatous change about the carotid bifurcations without stenosis. Electronically Signed   By: Rise Mu M.D.   On: 09/07/2020 21:22   MR BRAIN WO CONTRAST  Result Date: 09/07/2020 CLINICAL DATA:  Neuro deficit, acute, stroke suspected EXAM: MRI HEAD WITHOUT CONTRAST TECHNIQUE: Multiplanar, multiecho pulse sequences of the brain and surrounding structures were obtained without intravenous contrast. COMPARISON:  CT head 09/06/2020. FINDINGS: Brain: Small cortical infarct in the posterior left parietal lobe. Slight edema without mass effect. Remote infarcts in the inferolateral left cerebellum, right perirolandic frontoparietal region, and right frontal white matter with associated encephalomalacia and  gliosis. Moderate patchy confluent T2 hyperintensity in the supratentorial and pontine white matter, nonspecific but compatible with chronic microvascular ischemic disease. No evidence of acute hemorrhage, mass lesion, midline shift, extra-axial fluid collection, or hydrocephalus. Mild-to-moderate atrophy with ex vacuo ventricular dilation. Bilateral basal ganglia mineralization. Vascular: Major arterial flow voids are maintained at the skull base. Skull and upper cervical spine: Normal marrow signal. Sinuses/Orbits: Largely clear sinuses. No evidence of acute orbital abnormality. Other: No mastoid effusions. IMPRESSION: 1. Small cortical infarct in the posterior left parietal lobe.Slight edema without mass effect. 2. Remote infarcts in the inferolateral left cerebellum, right perirolandic frontoparietal region, and right frontal white matter. 3. Moderate chronic microvascular ischemic disease and mild to moderate atrophy. Electronically Signed   By: Feliberto Harts M.D.   On: 09/07/2020 09:28   DG Chest Portable 1 View  Result Date: 09/06/2020 CLINICAL DATA:  Altered mental status. EXAM: PORTABLE CHEST 1 VIEW COMPARISON:  Chest radiograph, 02/01/2016. FINDINGS: Cardiomediastinal silhouette is within normal. Lungs are lung type. Patchy LEFT basilar and middle lobe opacities. No pleural effusion or pneumothorax. No acute osseous. IMPRESSION: Patchy LEFT basilar middle lobe opacities, suspicious for developing pneumonia. Electronically Signed   By: Roanna Banning M.D.   On: 09/06/2020 12:51   ECHOCARDIOGRAM COMPLETE  Result Date: 09/07/2020    ECHOCARDIOGRAM REPORT   Patient Name:   NUMA HEATWOLE Date of Exam: 09/07/2020 Medical Rec #:  [id number]      Height:       65.0 in Accession #:    [id number]     Weight:       160.3 lb Date of Birth:  1950/08/26      BSA:          1.800 m Patient Age:    70 years       BP:           114/88 mmHg Patient Gender: F              HR:  89 bpm. Exam Location:   Inpatient Procedure: 2D Echo, Cardiac Doppler and Color Doppler Indications:    Stroke  History:        Patient has no prior history of Echocardiogram examinations.                 Signs/Symptoms:Altered Mental Status.  Sonographer:    Roosvelt Maserachel Lane RDCS Referring Phys: [id number] TOCHUKWU AGBATA IMPRESSIONS  1. Left ventricular ejection fraction, by estimation, is 60 to 65%. The left ventricle has normal function. The left ventricle has no regional wall motion abnormalities. Left ventricular diastolic parameters were normal.  2. Right ventricular systolic function is normal. The right ventricular size is normal.  3. The mitral valve is normal in structure. No evidence of mitral valve regurgitation. No evidence of mitral stenosis.  4. The aortic valve is tricuspid. Aortic valve regurgitation is not visualized. Mild aortic valve sclerosis is present, with no evidence of aortic valve stenosis.  5. The inferior vena cava is normal in size with greater than 50% respiratory variability, suggesting right atrial pressure of 3 mmHg. Comparison(s): No prior Echocardiogram. FINDINGS  Left Ventricle: Left ventricular ejection fraction, by estimation, is 60 to 65%. The left ventricle has normal function. The left ventricle has no regional wall motion abnormalities. The left ventricular internal cavity size was normal in size. There is  no left ventricular hypertrophy. Left ventricular diastolic parameters were normal. Right Ventricle: The right ventricular size is normal. Right ventricular systolic function is normal. Left Atrium: Left atrial size was normal in size. Right Atrium: Right atrial size was normal in size. Pericardium: There is no evidence of pericardial effusion. Mitral Valve: The mitral valve is normal in structure. No evidence of mitral valve regurgitation. No evidence of mitral valve stenosis. Tricuspid Valve: The tricuspid valve is normal in structure. Tricuspid valve regurgitation is trivial. No evidence of  tricuspid stenosis. Aortic Valve: The aortic valve is tricuspid. Aortic valve regurgitation is not visualized. Mild aortic valve sclerosis is present, with no evidence of aortic valve stenosis. Aortic valve mean gradient measures 5.0 mmHg. Aortic valve peak gradient measures 8.4 mmHg. Pulmonic Valve: The pulmonic valve was normal in structure. Pulmonic valve regurgitation is not visualized. No evidence of pulmonic stenosis. Aorta: The aortic root is normal in size and structure. Venous: The inferior vena cava is normal in size with greater than 50% respiratory variability, suggesting right atrial pressure of 3 mmHg. IAS/Shunts: No atrial level shunt detected by color flow Doppler.  LEFT VENTRICLE PLAX 2D LVIDd:         3.50 cm  Diastology LVIDs:         2.40 cm  LV e' medial:    9.25 cm/s LV PW:         0.90 cm  LV E/e' medial:  9.9 LV IVS:        0.90 cm  LV e' lateral:   13.70 cm/s LVOT diam:     1.80 cm  LV E/e' lateral: 6.7 LV SV:         52 LV SV Index:   29 LVOT Area:     2.54 cm  LEFT ATRIUM             Index       RIGHT ATRIUM           Index LA diam:        3.40 cm 1.89 cm/m  RA Area:     13.80 cm LA Vol (A2C):  41.8 ml 23.22 ml/m RA Volume:   32.90 ml  18.27 ml/m LA Vol (A4C):   19.8 ml 11.00 ml/m LA Biplane Vol: 28.9 ml 16.05 ml/m  AORTIC VALVE AV Area (Vmax):    1.94 cm AV Area (Vmean):   1.86 cm AV Area (VTI):     1.78 cm AV Vmax:           144.50 cm/s AV Vmean:          99.700 cm/s AV VTI:            0.294 m AV Peak Grad:      8.4 mmHg AV Mean Grad:      5.0 mmHg LVOT Vmax:         110.00 cm/s LVOT Vmean:        72.800 cm/s LVOT VTI:          0.206 m LVOT/AV VTI ratio: 0.70  AORTA Ao Root diam: 2.60 cm Ao Asc diam:  3.40 cm MITRAL VALVE MV Area (PHT): 4.15 cm     SHUNTS MV Decel Time: 183 msec     Systemic VTI:  0.21 m MV E velocity: 91.70 cm/s   Systemic Diam: 1.80 cm MV A velocity: 122.00 cm/s MV E/A ratio:  0.75 Olga Millers MD Electronically signed by Olga Millers MD Signature  Date/Time: 09/07/2020/2:11:08 PM    Final      Labs:   Basic Metabolic Panel: Recent Labs  Lab 09/06/20 1307 09/07/20 0448 09/08/20 0318  NA 137 140 140  K 3.9 3.3* 3.7  CL 103 106 107  CO2 25 23 22   GLUCOSE 149* 142* 118*  BUN 12 9 12   CREATININE 0.68 0.58 0.52  CALCIUM 9.3 9.8 9.6  MG  --   --  1.7   GFR Estimated Creatinine Clearance: 66.3 mL/min (by C-G formula based on SCr of 0.52 mg/dL). Liver Function Tests: Recent Labs  Lab 09/06/20 1307  AST 16  ALT 17  ALKPHOS 97  BILITOT 0.8  PROT 7.2  ALBUMIN 3.4*   No results for input(s): LIPASE, AMYLASE in the last 168 hours. Recent Labs  Lab 09/06/20 1307  AMMONIA 14   Coagulation profile No results for input(s): INR, PROTIME in the last 168 hours.  CBC: Recent Labs  Lab 09/06/20 1307 09/07/20 0448 09/08/20 0318  WBC 15.4* 14.9* 9.3  NEUTROABS 12.8*  --   --   HGB 14.4 13.2 13.3  HCT 44.4 41.3 40.9  MCV 81.5 81.1 80.5  PLT 403* 365 340   Cardiac Enzymes: No results for input(s): CKTOTAL, CKMB, CKMBINDEX, TROPONINI in the last 168 hours. BNP: Invalid input(s): POCBNP CBG: Recent Labs  Lab 09/07/20 1939 09/07/20 2345 09/08/20 0413 09/08/20 0728 09/08/20 1123  GLUCAP 130* 91 118* 119* 158*   D-Dimer No results for input(s): DDIMER in the last 72 hours. Hgb A1c Recent Labs    09/06/20 1734  HGBA1C 8.6*   Lipid Profile Recent Labs    09/07/20 0448  CHOL 127  HDL 30*  LDLCALC 68  TRIG 09/08/20  CHOLHDL 4.2   Thyroid function studies No results for input(s): TSH, T4TOTAL, T3FREE, THYROIDAB in the last 72 hours.  Invalid input(s): FREET3 Anemia work up No results for input(s): VITAMINB12, FOLATE, FERRITIN, TIBC, IRON, RETICCTPCT in the last 72 hours. Microbiology Recent Results (from the past 240 hour(s))  Resp Panel by RT-PCR (Flu A&B, Covid) Nasopharyngeal Swab     Status: None   Collection Time: 09/06/20 12:15 PM  Specimen: Nasopharyngeal Swab; Nasopharyngeal(NP) swabs in vial  transport medium  Result Value Ref Range Status   SARS Coronavirus 2 by RT PCR NEGATIVE NEGATIVE Final    Comment: (NOTE) SARS-CoV-2 target nucleic acids are NOT DETECTED.  The SARS-CoV-2 RNA is generally detectable in upper respiratory specimens during the acute phase of infection. The lowest concentration of SARS-CoV-2 viral copies this assay can detect is 138 copies/mL. A negative result does not preclude SARS-Cov-2 infection and should not be used as the sole basis for treatment or other patient management decisions. A negative result may occur with  improper specimen collection/handling, submission of specimen other than nasopharyngeal swab, presence of viral mutation(s) within the areas targeted by this assay, and inadequate number of viral copies(<138 copies/mL). A negative result must be combined with clinical observations, patient history, and epidemiological information. The expected result is Negative.  Fact Sheet for Patients:  BloggerCourse.com  Fact Sheet for Healthcare Providers:  SeriousBroker.it  This test is no t yet approved or cleared by the [place] FDA and  has been authorized for detection and/or diagnosis of SARS-CoV-2 by FDA under an Emergency Use Authorization (EUA). This EUA will remain  in effect (meaning this test can be used) for the duration of the COVID-19 declaration under Section 564(b)(1) of the Act, 21 U.S.C.section 360bbb-3(b)(1), unless the authorization is terminated  or revoked sooner.       Influenza A by PCR NEGATIVE NEGATIVE Final   Influenza B by PCR NEGATIVE NEGATIVE Final    Comment: (NOTE) The Xpert Xpress SARS-CoV-2/FLU/RSV plus assay is intended as an aid in the diagnosis of influenza from Nasopharyngeal swab specimens and should not be used as a sole basis for treatment. Nasal washings and aspirates are unacceptable for Xpert Xpress SARS-CoV-2/FLU/RSV testing.  Fact  Sheet for Patients: BloggerCourse.com  Fact Sheet for Healthcare Providers: SeriousBroker.it  This test is not yet approved or cleared by the [place] FDA and has been authorized for detection and/or diagnosis of SARS-CoV-2 by FDA under an Emergency Use Authorization (EUA). This EUA will remain in effect (meaning this test can be used) for the duration of the COVID-19 declaration under Section 564(b)(1) of the Act, 21 U.S.C. section 360bbb-3(b)(1), unless the authorization is terminated or revoked.  Performed at [facility], [address], [place], [place] [address]      Discharge Instructions:   Discharge Instructions     Ambulatory referral to Neurology   Complete by: As directed    An appointment is requested in approximately: 4 weeks   Ambulatory referral to Speech Therapy   Complete by: As directed    Diet - low sodium heart healthy   Complete by: As directed    Discharge instructions   Complete by: As directed    CheckFollow-up with [facility] in 4 to 6 weeks (office to call).  Follow-up with your primary care physician in 1 week.  Continue to take medications as prescribed.  Keep the incision site clean and dry for 3 days.  Wound check follow-up will be arranged by the clinic.   Increase activity slowly   Complete by: As directed       Allergies as of 09/08/2020       Reactions   Iodine Swelling   Shrimp [shellfish Allergy] Swelling   Fresh shrimp        Medication List     TAKE these medications    ALPRAZolam 1 MG tablet Commonly known as: XANAX Take 0.5-1  mg by mouth 2 (two) times daily as needed. For anxiety and sleep.   aspirin 81 MG EC tablet Take 1 tablet (81 mg total) by mouth daily. Swallow whole. Start taking on: September 09, 2020   atorvastatin 40 MG tablet Commonly known as: LIPITOR Take 40 mg by mouth daily.   clopidogrel 75 MG tablet Commonly known as:  PLAVIX Take 1 tablet (75 mg total) by mouth daily. Start taking on: September 09, 2020   doxycycline 100 MG tablet Commonly known as: VIBRA-TABS Take 1 tablet (100 mg total) by mouth 2 (two) times daily for 3 days.   lisinopril 20 MG tablet Commonly known as: ZESTRIL Take 20 mg by mouth daily.   metFORMIN 500 MG tablet Commonly known as: GLUCOPHAGE Take 1 tablet (500 mg total) by mouth daily with breakfast.   Myrbetriq 50 MG Tb24 tablet Generic drug: mirabegron ER Take 50 mg by mouth daily.   nicotine 14 mg/24hr patch Commonly known as: NICODERM CQ - dosed in mg/24 hours Place 1 patch (14 mg total) onto the skin daily. Start taking on: September 09, 2020   OMEGA 3 500 PO Take 1 capsule by mouth daily.   Evaristo Bury FlexTouch 100 UNIT/ML FlexTouch Pen Generic drug: insulin degludec Inject 15 Units into the skin daily.   triamcinolone cream 0.1 % Commonly known as: KENALOG Apply 1 application topically 3 (three) times daily.   vitamin B-12 1000 MCG tablet Commonly known as: CYANOCOBALAMIN Take 1,000 mcg by mouth daily.        Follow-up Information     [facility] Follow [facility].   Specialty: Rehabilitation Why: Please call them Monday am for an appointment. Contact information: [address] [id number] [place] [address] [phone]        Allison Ok, MD. Schedule an appointment as soon as possible for a visit in 1 week(s).   Specialty: Internal Medicine Why: regular followup Contact information: [address] [facility] [address] [phone]                  Time coordinating discharge: 39 minutes  Signed:  Westlee Devita  [facility] 09/08/2020, 4:47 PM

## 2020-09-08 NOTE — Care Management Important Message (Signed)
Important Message  Patient Details  Name: Leilene Diprima MRN: [id number] Date of Birth: 1950/12/18   Medicare Important Message Given:  Yes     Laurenashley Viar 09/08/2020, 4:02 PM

## 2020-09-08 NOTE — Progress Notes (Signed)
Discussed discharge instructions with patient. All belongings returned. Pt left floor via wheelchair

## 2020-09-08 NOTE — TOC Initial Note (Signed)
Transition of Care [facility]) - Initial/Assessment Note    Patient Details  Name: Allison Hughes MRN: [id number] Date of Birth: 04/29/1950  Transition of Care [facility]) CM/SW Contact:    Joanne Chars, LCSW Phone Number: 09/08/2020, 10:13 AM  Clinical Narrative:     CSW met with pt and significant other Doren Custard for initial assessment.  Permission given to speak with Doren Custard present.  Pt somewhat hard of hearing and Doren Custard provide s some information but pt did participate. Pt lives alone, Doren Custard reports he is in the home "70%" of the time now.  Discussed OT recommendation that pt have someone with her 24/7 initially after discharge from hospital.  Doren Custard reports he can provide that.  No current recommendation for PT/OT follow up.  Possible need for outpt SLP.  PCP in place.  No DME in the home currently.  Pt has been vaccinated for covid with both boosters.  CSW will continue to follow.               Expected Discharge Plan: Home/Self Care Barriers to Discharge: No Barriers Identified   Patient Goals and CMS Choice Patient states their goals for this hospitalization and ongoing recovery are:: "keep on getting better" CMS Medicare.gov Compare Post Acute Care list provided to::  (NA)    Expected Discharge Plan and Services Expected Discharge Plan: Home/Self Care     Post Acute Care Choice: NA Living arrangements for the past 2 months: Single Family Home                           HH Arranged: NA          Prior Living Arrangements/Services Living arrangements for the past 2 months: Single Family Home Lives with:: Self Patient language and need for interpreter reviewed:: Yes Do you feel safe going back to the place where you live?: Yes      Need for Family Participation in Patient Care: Yes (Comment) Care giver support system in place?: Yes (comment) Current home services: Other (comment) (na) Criminal Activity/Legal Involvement Pertinent to Current  Situation/Hospitalization: No - Comment as needed  Activities of Daily Living Home Assistive Devices/Equipment: None ADL Screening (condition at time of admission) Patient's cognitive ability adequate to safely complete daily activities?: Yes Is the patient deaf or have difficulty hearing?: Yes Does the patient have difficulty seeing, even when wearing glasses/contacts?: No Does the patient have difficulty concentrating, remembering, or making decisions?: No Patient able to express need for assistance with ADLs?: Yes Does the patient have difficulty dressing or bathing?: No Independently performs ADLs?: Yes (appropriate for developmental age) Does the patient have difficulty walking or climbing stairs?: No Weakness of Legs: None Weakness of Arms/Hands: None  Permission Sought/Granted Permission sought to share information with : Family Supports Permission granted to share information with : Yes, Verbal Permission Granted  Share Information with NAME: Doren Custard, sig other           Emotional Assessment Appearance:: Appears stated age Attitude/Demeanor/Rapport: Engaged Affect (typically observed): Appropriate Orientation: : Oriented to Self, Oriented to Place Alcohol / Substance Use: Not Applicable Psych Involvement: No (comment)  Admission diagnosis:  Aphasia [R47.01] Pneumonia [J18.9] Community acquired pneumonia, unspecified laterality [J18.9] Patient Active Problem List   Diagnosis Date Noted   Pneumonia 09/06/2020   CVA (cerebral vascular accident) ([place]) 09/06/2020   Type 2 diabetes mellitus without complication, without long-term current use of insulin ([place]) 11/20/2018   Type 2 diabetes mellitus with hyperglycemia,  without long-term current use of insulin ([place]) 09/18/2018   Sepsis ([place]) 02/02/2016   Acute pyelonephritis 02/02/2016   Hypokalemia 02/02/2016   Anxiety 02/02/2016   Essential hypertension 02/02/2016   PCP:  Jilda Panda, MD Pharmacy:   [facility] [place] [address] Phone: [phone] Fax: [phone]     Social Determinants of Health (SDOH) Interventions    Readmission Risk Interventions No flowsheet data found.

## 2020-09-10 ENCOUNTER — Encounter (HOSPITAL_COMMUNITY): Payer: Self-pay | Admitting: Cardiology

## 2020-09-19 ENCOUNTER — Ambulatory Visit: Payer: Medicare Other | Attending: Internal Medicine | Admitting: Speech Pathology

## 2020-09-19 ENCOUNTER — Other Ambulatory Visit: Payer: Self-pay

## 2020-09-19 ENCOUNTER — Encounter: Payer: Self-pay | Admitting: Speech Pathology

## 2020-09-19 DIAGNOSIS — R41841 Cognitive communication deficit: Secondary | ICD-10-CM | POA: Insufficient documentation

## 2020-09-19 DIAGNOSIS — R4701 Aphasia: Secondary | ICD-10-CM | POA: Insufficient documentation

## 2020-09-19 NOTE — Addendum Note (Signed)
Addended by: Bertrum Sol L on: 09/19/2020 04:11 PM   Modules accepted: Orders

## 2020-09-19 NOTE — Therapy (Addendum)
[facility] [address] [place], [place], [address] Phone: [phone]   Fax:  [phone]  Speech Language Pathology Evaluation  Patient Details  Name: Allison Hughes MRN: [id number] Date of Birth: 1950-01-22 Referring Provider (SLP): Joycelyn Das, MD   Encounter Date: 09/19/2020   End of Session - 09/19/20 1429     Visit Number 1    Number of Visits 8    Date for SLP Re-Evaluation 11/19/20    SLP Start Time 1315    SLP Stop Time  1400    SLP Time Calculation (min) 45 min             Past Medical History:  Diagnosis Date   Diabetes mellitus 2006   T2DM   Hyperlipidemia    Hypertension    Obesity (BMI 30-39.9)     Past Surgical History:  Procedure Laterality Date   CESAREAN SECTION     LOOP RECORDER INSERTION N/A 09/08/2020   Procedure: LOOP RECORDER INSERTION;  Surgeon: Regan Lemming, MD;  Location: [facility] INVASIVE CV LAB;  Service: Cardiovascular;  Laterality: N/A;   ORIF ANKLE FRACTURE  05/11/2011   Procedure: OPEN REDUCTION INTERNAL FIXATION (ORIF) ANKLE FRACTURE;  Surgeon: Nadara Mustard, MD;  Location: [facility] OR;  Service: Orthopedics;  Laterality: Right;   TUBAL LIGATION      There were no vitals filed for this visit.   Subjective Assessment - 09/19/20 1428     Subjective Pt was pleasant and cooperative throughout evaluation.    Currently in Pain? No/denies                SLP Evaluation [facility] - 09/19/20 1431       SLP Visit Information   SLP Received On 09/19/20    Referring Provider (SLP) Joycelyn Das, MD    Onset Date 09/06/20    Medical Diagnosis CVA      Subjective   Patient/Family Stated Goal To be back to normal      General Information   HPI Allison Hughes is a 70 y.o. female with medical history significant for diabetes mellitus, hypertension, dyslipidemia presented to hospital after being confused and having difficulty expressing words..  Patient was brought in by the EMS  after being called by the neighbor who stated that patient had complained of some headache.  Last known well was 09/05/20 at about noon.  In the ED, vitals were stable.  Laboratory data was essentially within normal range except for mild leukocytosis.  CT head scan without any acute findings but chest x-ray showed patchy left basilar opacity.  EKG showed normal sinus rhythm.  Patient was given antibiotic and neurology was consulted.  Neurology requested MRI scan and the patient was admitted for CVA.      Balance Screen   Has the patient fallen in the past 6 months No      Prior Functional Status   Cognitive/Linguistic Baseline Within functional limits    Type of Home House     Lives With Alone    Available Support Friend(s)    Vocation Retired      IT consultant   Overall Cognitive Status Impaired/Different from baseline    Area of Impairment Attention;Memory    Memory Decreased short-term memory    Attention Selective;Alternating;Divided    Memory Impaired    Memory Impairment Decreased short term memory;Decreased recall of new information    Awareness Impaired    Awareness Impairment Anticipatory impairment    Executive Function  Reasoning;Self Correcting      Auditory Comprehension   Overall Auditory Comprehension Appears within functional limits for tasks assessed    Interfering Components Hearing      Reading Comprehension   Reading Status Within funtional limits      Verbal Expression   Overall Verbal Expression Appears within functional limits for tasks assessed      Standardized Assessments   Standardized Assessments  Other Assessment    Other Assessment WAB-B, SLUMS               Western Aphasia Battery - Bedside Record Form   Spontaneous Speech - Content: 10/10 Spontaneous Speech- Fluency: 10 /10 Auditory Verbal Comprehension- Yes/No Questions: 10/10 Sequential Commands: 10/10  Repetition: 10/10 Object naming: 9/10 *one paraphasia Reading: 10/10 Writing:  10/10 Apraxia (Optional): NA  **No other paraphasias/word finding deficits noted in conversation this assessment date.**  Score: [place]    SLU Mental Status (SLUMS Examination)  Orientation: 3/3 Delayed Recall w/ Interference: 3/5 Numeric Calculation and Registration: 3/3 Immediate Recall w/ Interference (Generative naming): 3/3 Registration and Digit Span: 0/2 Visual Spatial/Exec Functioning: 4/6 Executive Functioning/Extrapolation:  4/8  Total: 20/30         SLP Education - 09/19/20 1429     Education Details Cognitive-communication impairment    Person(s) Educated Patient    Methods Explanation;Handout    Comprehension Need further instruction              SLP Short Term Goals - 09/19/20 1524       SLP SHORT TERM GOAL #1   Title Pt will recall 3 memory strategies to assist with recall of important information across 4 sessions.    Time 4    Period Weeks    Status New    Target Date 10/17/20      SLP SHORT TERM GOAL #2   Title Pt will recall 3 attention strategies to assist in increasing concentration and decrease cognitive fatigue across 4 sessions.    Time 4    Period Weeks    Status New    Target Date 10/17/20              SLP Long Term Goals - 09/19/20 1526       SLP LONG TERM GOAL #1   Title Pt/boyfriend will report succesful use of memory strategies to increase recall of important information at the end of 8 weeks.    Time 8    Period Weeks    Status New    Target Date 11/19/20      SLP LONG TERM GOAL #2   Title Pt/boyfriend will report the use of attention strategies to decrease cognitive load and increase concentration for better participation in ADLs at the end of 8 weeks.    Time 8    Period Weeks    Status New    Target Date 11/19/20              Plan - 09/19/20 1430     Clinical Impression Statement Pt is a 70 yo female who presents to OP for evaluation post CVA. Prior to hospitalization, pt reported difficulty with  memory and attention. Pt expresses concern with hearing and plans for hearing aids in the coming weeks. Post stroke, pt feels anxious regarding her health and is nervous about living alone. Pt required repetition of questions throughout assessment and redirecting to stay on task.  SLP administered the WAB bedside to assess the expressive and receptive language. The pt  was within functional limits [facility]) for expressive and receptive language upon completing this assessment. Pt expressed no issues with word finding deficits or language comprehension difficulties. She reported in the hospital, people thought she was "being difficult", but she "just couldn't hear".  SLP screened  pt's cognition using the SLUMS in which she scored a 20/30. Pt exhibited the most difficulty with working memory, short term memory, and executive functioning skills (i.e. organization, planning). Pt also appeared to have some difficulties with awareness of deficits. SLP educated and pt in agreement to move forward with ST. SLP rec speech therapy services to address cognitive-communication impairment with training on memory and attention strategies to increase QOL.    Speech Therapy Frequency 1x /week    Duration 8 weeks    Treatment/Interventions Compensatory strategies;Cueing hierarchy;Functional tasks;Patient/family education;Environmental controls;Cognitive reorganization;Compensatory techniques;Internal/external aids;SLP instruction and feedback    Potential to Achieve Goals Good    Consulted and Agree with Plan of Care Patient             Patient will benefit from skilled therapeutic intervention in order to improve the following deficits and impairments:   Cognitive communication deficit  Aphasia    Problem List Patient Active Problem List   Diagnosis Date Noted   Pneumonia 09/06/2020   CVA (cerebral vascular accident) ([facility]) 09/06/2020   Type 2 diabetes mellitus without complication, without long-term current use  of insulin ([facility]) 11/20/2018   Type 2 diabetes mellitus with hyperglycemia, without long-term current use of insulin ([facility]) 09/18/2018   Sepsis ([facility]) 02/02/2016   Acute pyelonephritis 02/02/2016   Hypokalemia 02/02/2016   Anxiety 02/02/2016   Essential hypertension 02/02/2016    I agree with the following treatment note after reviewing documentation. This session was performed under the supervision of a licensed clinician.    Dorena Bodo MS, CCC-SLP, CBIS  [address] Technical sales engineer and Disorders (Student)  09/19/2020, 3:51 PM  [facility] [address] [place], [place], [address] Phone: [phone]   Fax:  [phone]  Name: Allison Hughes MRN: [id number] Date of Birth: [birth year]

## 2020-09-21 ENCOUNTER — Other Ambulatory Visit: Payer: Self-pay

## 2020-09-21 ENCOUNTER — Ambulatory Visit (INDEPENDENT_AMBULATORY_CARE_PROVIDER_SITE_OTHER): Payer: Medicare Other

## 2020-09-21 DIAGNOSIS — I63412 Cerebral infarction due to embolism of left middle cerebral artery: Secondary | ICD-10-CM

## 2020-09-21 LAB — CUP PACEART INCLINIC DEVICE CHECK
Date Time Interrogation Session: 20220908155148
Implantable Pulse Generator Implant Date: 20220826

## 2020-09-21 NOTE — Progress Notes (Signed)
ILR wound check in clinic. Steri strips removed. Wound well healed. Home monitor transmitting nightly. No episodes. Questions answered.  

## 2020-10-05 ENCOUNTER — Ambulatory Visit
Admission: RE | Admit: 2020-10-05 | Discharge: 2020-10-05 | Disposition: A | Discharge status: Home / Self Care | Payer: Medicare Other | Source: Ambulatory Visit | Attending: Internal Medicine | Admitting: Internal Medicine

## 2020-10-05 ENCOUNTER — Other Ambulatory Visit: Payer: Self-pay | Admitting: Internal Medicine

## 2020-10-05 ENCOUNTER — Other Ambulatory Visit: Payer: Self-pay

## 2020-10-05 DIAGNOSIS — J189 Pneumonia, unspecified organism: Secondary | ICD-10-CM

## 2020-10-16 ENCOUNTER — Ambulatory Visit (INDEPENDENT_AMBULATORY_CARE_PROVIDER_SITE_OTHER): Payer: Medicare Other

## 2020-10-16 DIAGNOSIS — I63412 Cerebral infarction due to embolism of left middle cerebral artery: Secondary | ICD-10-CM | POA: Diagnosis not present

## 2020-10-16 LAB — CUP PACEART REMOTE DEVICE CHECK
Date Time Interrogation Session: 20221001202100
Implantable Pulse Generator Implant Date: 20220826

## 2020-10-23 NOTE — Progress Notes (Signed)
Carelink Summary Report / Loop Recorder 

## 2020-10-26 ENCOUNTER — Encounter: Payer: Self-pay | Admitting: Adult Health

## 2020-10-26 ENCOUNTER — Other Ambulatory Visit: Payer: Self-pay

## 2020-10-26 ENCOUNTER — Ambulatory Visit (INDEPENDENT_AMBULATORY_CARE_PROVIDER_SITE_OTHER): Payer: Medicare Other | Admitting: Adult Health

## 2020-10-26 VITALS — BP 118/82 | HR 85 | Ht 62.0 in | Wt 156.0 lb

## 2020-10-26 DIAGNOSIS — I63412 Cerebral infarction due to embolism of left middle cerebral artery: Secondary | ICD-10-CM

## 2020-10-26 DIAGNOSIS — Z720 Tobacco use: Secondary | ICD-10-CM

## 2020-10-26 DIAGNOSIS — Z8673 Personal history of transient ischemic attack (TIA), and cerebral infarction without residual deficits: Secondary | ICD-10-CM | POA: Diagnosis not present

## 2020-10-26 DIAGNOSIS — E119 Type 2 diabetes mellitus without complications: Secondary | ICD-10-CM

## 2020-10-26 DIAGNOSIS — I10 Essential (primary) hypertension: Secondary | ICD-10-CM

## 2020-10-26 DIAGNOSIS — E785 Hyperlipidemia, unspecified: Secondary | ICD-10-CM

## 2020-10-26 NOTE — Patient Instructions (Signed)
Continue aspirin 81 mg daily  and atorvastatin 40mg  daily  for secondary stroke prevention  Continue to follow up with PCP regarding cholesterol, blood pressure and diabetes management  Maintain strict control of hypertension with blood pressure goal below 130/90, diabetes with hemoglobin A1c goal below 7.0% and cholesterol with LDL cholesterol (bad cholesterol) goal below 70 mg/dL.   Loop recorder has not shown atrial fibrillation thus far - will continue to be monitored by cardiology  Highly recommend complete tobacco cessation as continued use greatly increases your risk of recurrent strokes      Followup in the future with me in 6 months or call earlier if needed       Thank you for coming to see at [facility]. I hope we have been able to provide you high quality care today.  You may receive a patient satisfaction survey over the next few weeks. We would appreciate your feedback and comments so that we may continue to improve ourselves and the health of our patients.

## 2020-10-26 NOTE — Progress Notes (Signed)
[facility] [address] [place]. [place] [address] [phone]       HOSPITAL FOLLOW UP NOTE  Ms. Allison Hughes Date of Birth:  1950/10/05 Medical Record Number:  [id number]   Reason for Referral:  hospital stroke follow up    SUBJECTIVE:   CHIEF COMPLAINT:  Chief Complaint  Patient presents with   Follow-up    Rm 3 here for hospital f/u reports she has been doing well since d.c.    HPI:   Ms. Allison Hughes is a 70 y.o. female with history of uncontrolled diabetes mellitus, hypertension, dyslipidemia and tobacco use presented to the ED on 09/06/2020 for confusion, expressive aphasia and headache that started the day prior.  Stroke work-up left PCA/MCA watershed infarct embolic secondary to probable A. fib given findings of previous multiple embolic infarcts (inferolateral left cerebellum, right perirolandic frontoparietal region and right frontal white matter).  Loop recorder placed to evaluate for A. fib.  MRA head/neck negative LVO.  EF 60 to 65%.  LDL 68.  A1c 8.6.  Recommended DAPT for 3 weeks and aspirin alone.  Home dose atorvastatin 40 mg daily resumed at discharge.  Smoking cessation counseling provided.  Treated for pneumonia on ceftriaxone and azithromycin per primary team.  Therapies recommended outpatient SLP for residual aphasia.  Today, 10/26/2020, patient being seen for initial hospital follow-up.  Denies residual aphasia or any other speech or language difficulties. Eval by OP SLP 9/6 with concerns of cognitive impairment and decreased awareness of deficits but as she has been improving she does not feel like this is needed. She plans to call if she feels like this is needed in the future. Does report baseline memory loss. She has returned back to all her prior activities without difficulty. Her daughter passed away 2020-01-11 - reports great depression and was not keeping up with her health. Since her stroke, she has been improving her diet and has  been trying to increase her activity.  Denies new stroke/TIA symptoms.  Completed 3 weeks DAPT remains on aspirin alone as well as atorvastatin without side effects.  Blood pressure today 118/82.  Loop recorder has not shown atrial fibrillation thus far. Continued tobacco use - rolls her own cigarettes. Has been trying to decrease amount.   Recently seen by PCP and has a f/u in Jan  No further concerns at this time   PERTINENT IMAGING  MR BRAIN WO CONTRAST 09/07/2020 IMPRESSION: 1. Small cortical infarct in the posterior left parietal lobe.Hughes edema without mass effect. 2. Remote infarcts in the inferolateral left cerebellum, right perirolandic frontoparietal region, and right frontal white matter. 3. Moderate chronic microvascular ischemic disease and mild to moderate atrophy.  MR ANGIO HEAD/NECK 09/08/2018 IMPRESSION: 1. Negative MRA of the head and neck. No large vessel occlusion, hemodynamically significant stenosis, or other acute vascular abnormality. 2. Mild for age atheromatous change about the carotid bifurcations without stenosis.  2D ECHO 09/07/2020 IMPRESSIONS   1. Left ventricular ejection fraction, by estimation, is 60 to 65%. The  left ventricle has normal function. The left ventricle has no regional  wall motion abnormalities. Left ventricular diastolic parameters were  normal.   2. Right ventricular systolic function is normal. The right ventricular  size is normal.   3. The mitral valve is normal in structure. No evidence of mitral valve  regurgitation. No evidence of mitral stenosis.   4. The aortic valve is tricuspid. Aortic valve regurgitation is not  visualized. Mild aortic valve sclerosis is present, with no  evidence of  aortic valve stenosis.   5. The inferior vena cava is normal in size with greater than 50%  respiratory variability, suggesting right atrial pressure of 3 mmHg.       ROS:   14 system review of systems performed and negative  with exception of those listed in HPI  PMH:  Past Medical History:  Diagnosis Date   Diabetes mellitus 2006   T2DM   Hyperlipidemia    Hypertension    Obesity (BMI 30-39.9)     PSH:  Past Surgical History:  Procedure Laterality Date   CESAREAN SECTION     LOOP RECORDER INSERTION N/A 09/08/2020   Procedure: LOOP RECORDER INSERTION;  Surgeon: Regan Lemming, MD;  Location: [facility];  Service: Cardiovascular;  Laterality: N/A;   ORIF ANKLE FRACTURE  05/11/2011   Procedure: OPEN REDUCTION INTERNAL FIXATION (ORIF) ANKLE FRACTURE;  Surgeon: Nadara Mustard, MD;  Location: [facility] OR;  Service: Orthopedics;  Laterality: Right;   TUBAL LIGATION      Social History:  Social History   Socioeconomic History   Marital status: Divorced    Spouse name: Not on file   Number of children: Not on file   Years of education: Not on file   Highest education level: Not on file  Occupational History   Not on file  Tobacco Use   Smoking status: Every Day    Packs/day: 1.00    Years: 40.00    Pack years: 40.00    Types: Cigarettes   Smokeless tobacco: Former    Quit date: 05/11/2011  Substance and Sexual Activity   Alcohol use: No   Drug use: No   Sexual activity: Never  Other Topics Concern   Not on file  Social History Narrative   Not on file   Social Determinants of Health   Financial Resource Strain: Not on file  Food Insecurity: Not on file  Transportation Needs: Not on file  Physical Activity: Not on file  Stress: Not on file  Social Connections: Not on file  Intimate Partner Violence: Not on file    Family History:  Family History  Problem Relation Age of Onset   Drug abuse Daughter    Diabetes Daughter     Medications:   Current Outpatient Medications on File Prior to Visit  Medication Sig Dispense Refill   ALPRAZolam (XANAX) 1 MG tablet Take 0.5-1 mg by mouth 2 (two) times daily as needed. For anxiety and sleep.     aspirin EC 81 MG EC tablet Take 1  tablet (81 mg total) by mouth daily. Swallow whole. 30 tablet 11   atorvastatin (LIPITOR) 40 MG tablet Take 40 mg by mouth daily.     co-enzyme Q-10 30 MG capsule Take 30 mg by mouth 3 (three) times daily.     lisinopril (PRINIVIL,ZESTRIL) 20 MG tablet Take 20 mg by mouth daily.     metFORMIN (GLUCOPHAGE-XR) 500 MG 24 hr tablet Take 500 mg by mouth daily.     Multiple Vitamin (MULTIVITAMIN ADULT PO) Take by mouth.     MYRBETRIQ 50 MG TB24 tablet Take 50 mg by mouth daily.     nicotine (NICODERM CQ - DOSED IN MG/24 HOURS) 14 mg/24hr patch Place 1 patch (14 mg total) onto the skin daily. 28 patch 0   Omega-3 Fatty Acids (OMEGA 3 500 PO) Take 1 capsule by mouth daily. 1000 mg     TRESIBA FLEXTOUCH 100 UNIT/ML FlexTouch Pen Inject 26 Units  into the skin daily.     triamcinolone cream (KENALOG) 0.1 % Apply 1 application topically 3 (three) times daily.     vitamin B-12 (CYANOCOBALAMIN) 1000 MCG tablet Take 1,000 mcg by mouth daily.       VITAMIN D PO Take by mouth.     No current facility-administered medications on file prior to visit.    Allergies:   Allergies  Allergen Reactions   Iodine Swelling   Shrimp [Shellfish Allergy] Swelling    Fresh shrimp      OBJECTIVE:  Physical Exam  Vitals:   10/26/20 0902  BP: 118/82  Pulse: 85  SpO2: 96%  Weight: 156 lb (70.8 kg)  Height: 5\' 2"  (1.575 m)   Body mass index is 28.53 kg/m. No results found.  Post stroke PHQ 2/9 Depression screen PHQ 2/9 10/26/2020  Decreased Interest 0  Down, Depressed, Hopeless 0  PHQ - 2 Score 0     General: well developed, well nourished, very pleasant middle-age Caucasian female, seated, in no evident distress Head: head normocephalic and atraumatic.   Neck: supple with no carotid or supraclavicular bruits Cardiovascular: regular rate and rhythm, no murmurs Musculoskeletal: no deformity Skin:  no rash/petichiae Vascular:  Normal pulses all extremities   Neurologic Exam Mental Status: Awake  and fully alert. Fluent speech and language. Oriented to place and time. Recent subjectively mildly impaired and remote memory intact. Attention span, concentration and fund of knowledge appropriate during visit. Mood and affect appropriate.  Cranial Nerves: Fundoscopic exam reveals sharp disc margins. Pupils equal, briskly reactive to light. Extraocular movements full without nystagmus. Visual fields full to confrontation. Hearing intact. Facial sensation intact. Face, tongue, palate moves normally and symmetrically.  Motor: Normal bulk and tone. Normal strength in all tested extremity muscles Sensory.: intact to touch , pinprick , position and vibratory sensation.  Coordination: Rapid alternating movements normal in all extremities. Finger-to-nose and heel-to-shin performed accurately bilaterally. Gait and Station: Arises from chair without difficulty. Stance is normal. Gait demonstrates normal stride length and balance without assistive device. Tandem walk and heel toe without difficulty.  Reflexes: 1+ and symmetric. Toes downgoing.     NIHSS  0 Modified Rankin  1      ASSESSMENT: Allison Hughes is a 70 y.o. year old female with recent left PCA/MCA watershed infarcts embolic secondary to unknown source although high suspicion for A. fib given findings of previous old multiple embolic infarcts on imaging on 09/06/2020 s/p loop recorder. Vascular risk factors include HTN, HLD, DM and tobacco use.      PLAN:  Multiple recurrent embolic strokes:  Residual deficit: recovered well with minimal cognitive impairment but overall greatly improving.  Loop recorder has not shown atrial fibrillation thus far Consider participation in [place] trial -additional information provided by research team Continue aspirin 81 mg daily  and atorvastatin 40 mg daily for secondary stroke prevention.   Discussed secondary stroke prevention measures and importance of close PCP follow up for aggressive stroke risk  factor management. I have gone over the pathophysiology of stroke, warning signs and symptoms, risk factors and their management in some detail with instructions to go to the closest emergency room for symptoms of concern. HTN: BP goal <130/90.  Stable on current regimen per PCP HLD: LDL goal <70. Recent LDL 68 on atorvastatin 40 mg daily per PCP DMII: A1c goal<7.0. Recent A1c 7 down from 8.6 per recent labs by PCP (unable to view via epic).  Tobacco use: discussed importance of complete tobacco  cessation. Advised to f/u with PCP if further assistance is needed    Follow up in 6 months or call earlier if needed   CC:  [facility] provider: Dr. Pearlean Brownie PCP: Ralene Ok, MD    I spent 56 minutes of face-to-face and non-face-to-face time with patient.  This included previsit chart review including review of recent hospitalization, lab review, study review, electronic health record documentation, patient education regarding recent stroke including etiology, secondary stroke prevention measures and importance of managing stroke risk factors, residual deficits and typical recovery time, information regarding [place] trial and ongoing monitoring of loop recorder and answered all other questions to patient satisfaction  Ihor Austin, AGNP-BC  [facility] [address] [place], [place] [address]  Phone [phone] Fax [phone] Note: This document was prepared with digital dictation and possible smart phrase technology. Any transcriptional errors that result from this process are unintentional.

## 2020-11-02 NOTE — Progress Notes (Signed)
I agree with the above plan 

## 2020-11-17 LAB — CUP PACEART REMOTE DEVICE CHECK
Date Time Interrogation Session: 20221103202123
Implantable Pulse Generator Implant Date: 20220826

## 2020-11-20 ENCOUNTER — Ambulatory Visit (INDEPENDENT_AMBULATORY_CARE_PROVIDER_SITE_OTHER): Payer: Medicare Other

## 2020-11-20 DIAGNOSIS — I63412 Cerebral infarction due to embolism of left middle cerebral artery: Secondary | ICD-10-CM | POA: Diagnosis not present

## 2020-11-23 NOTE — Progress Notes (Signed)
Carelink Summary Report / Loop Recorder 

## 2020-12-20 LAB — CUP PACEART REMOTE DEVICE CHECK
Date Time Interrogation Session: 20221206201917
Implantable Pulse Generator Implant Date: 20220826

## 2020-12-25 ENCOUNTER — Ambulatory Visit (INDEPENDENT_AMBULATORY_CARE_PROVIDER_SITE_OTHER): Payer: Medicare Other

## 2020-12-25 DIAGNOSIS — I63412 Cerebral infarction due to embolism of left middle cerebral artery: Secondary | ICD-10-CM

## 2021-01-01 NOTE — Progress Notes (Signed)
Carelink Summary Report / Loop Recorder 

## 2021-01-29 ENCOUNTER — Ambulatory Visit (INDEPENDENT_AMBULATORY_CARE_PROVIDER_SITE_OTHER): Payer: Medicare Other

## 2021-01-29 DIAGNOSIS — I63412 Cerebral infarction due to embolism of left middle cerebral artery: Secondary | ICD-10-CM

## 2021-01-29 LAB — CUP PACEART REMOTE DEVICE CHECK
Date Time Interrogation Session: 20230115231218
Implantable Pulse Generator Implant Date: 20220826

## 2021-02-07 NOTE — Progress Notes (Signed)
Carelink Summary Report / Loop Recorder 

## 2021-03-03 LAB — CUP PACEART REMOTE DEVICE CHECK
Date Time Interrogation Session: 20230217230234
Implantable Pulse Generator Implant Date: 20220826

## 2021-03-05 ENCOUNTER — Ambulatory Visit (INDEPENDENT_AMBULATORY_CARE_PROVIDER_SITE_OTHER): Payer: Medicare Other

## 2021-03-05 DIAGNOSIS — I63412 Cerebral infarction due to embolism of left middle cerebral artery: Secondary | ICD-10-CM | POA: Diagnosis not present

## 2021-03-08 NOTE — Progress Notes (Signed)
Carelink Summary Report / Loop Recorder 

## 2021-04-09 ENCOUNTER — Ambulatory Visit (INDEPENDENT_AMBULATORY_CARE_PROVIDER_SITE_OTHER): Payer: Medicare Other

## 2021-04-09 DIAGNOSIS — I63412 Cerebral infarction due to embolism of left middle cerebral artery: Secondary | ICD-10-CM | POA: Diagnosis not present

## 2021-04-10 LAB — CUP PACEART REMOTE DEVICE CHECK
Date Time Interrogation Session: 20230326231244
Implantable Pulse Generator Implant Date: 20220826

## 2021-04-18 NOTE — Progress Notes (Signed)
Carelink Summary Report / Loop Recorder 

## 2021-04-26 ENCOUNTER — Ambulatory Visit: Payer: Medicare Other | Admitting: Adult Health

## 2021-05-12 LAB — CUP PACEART REMOTE DEVICE CHECK
Date Time Interrogation Session: 20230428230525
Implantable Pulse Generator Implant Date: 20220826

## 2021-05-14 ENCOUNTER — Ambulatory Visit (INDEPENDENT_AMBULATORY_CARE_PROVIDER_SITE_OTHER): Payer: Medicare Other

## 2021-05-14 DIAGNOSIS — I63412 Cerebral infarction due to embolism of left middle cerebral artery: Secondary | ICD-10-CM

## 2021-05-28 NOTE — Progress Notes (Signed)
Carelink Summary Report / Loop Recorder 

## 2021-06-17 LAB — CUP PACEART REMOTE DEVICE CHECK
Date Time Interrogation Session: 20230531230454
Implantable Pulse Generator Implant Date: 20220826

## 2021-06-18 ENCOUNTER — Ambulatory Visit (INDEPENDENT_AMBULATORY_CARE_PROVIDER_SITE_OTHER): Payer: Medicare Other

## 2021-06-18 DIAGNOSIS — I63412 Cerebral infarction due to embolism of left middle cerebral artery: Secondary | ICD-10-CM | POA: Diagnosis not present

## 2021-07-04 NOTE — Progress Notes (Signed)
Carelink Summary Report / Loop Recorder 

## 2021-07-11 ENCOUNTER — Telehealth: Payer: Self-pay

## 2021-07-11 NOTE — Telephone Encounter (Signed)
Called patient informed her that there were no episodes normal device function normal heart rates patient voiced understanding, appreciative of call back

## 2021-07-11 NOTE — Telephone Encounter (Signed)
Patient called in wanting a nurse to call her to go over her last transmission results she does not understand it

## 2021-07-20 LAB — CUP PACEART REMOTE DEVICE CHECK
Date Time Interrogation Session: 20230703230420
Implantable Pulse Generator Implant Date: 20220826

## 2021-07-23 ENCOUNTER — Ambulatory Visit (INDEPENDENT_AMBULATORY_CARE_PROVIDER_SITE_OTHER): Payer: Medicare Other

## 2021-07-23 DIAGNOSIS — I63412 Cerebral infarction due to embolism of left middle cerebral artery: Secondary | ICD-10-CM | POA: Diagnosis not present

## 2021-08-15 ENCOUNTER — Ambulatory Visit: Payer: Medicare Other | Admitting: Adult Health

## 2021-08-22 LAB — CUP PACEART REMOTE DEVICE CHECK
Date Time Interrogation Session: 20230805230847
Implantable Pulse Generator Implant Date: 20220826

## 2021-08-23 NOTE — Progress Notes (Signed)
Carelink Summary Report / Loop Recorder 

## 2021-08-24 ENCOUNTER — Other Ambulatory Visit: Payer: Self-pay | Admitting: Internal Medicine

## 2021-08-24 DIAGNOSIS — F17200 Nicotine dependence, unspecified, uncomplicated: Secondary | ICD-10-CM

## 2021-08-24 DIAGNOSIS — Z87891 Personal history of nicotine dependence: Secondary | ICD-10-CM

## 2021-08-27 ENCOUNTER — Ambulatory Visit: Payer: Medicare Other

## 2021-08-30 ENCOUNTER — Ambulatory Visit: Payer: Medicare Other | Admitting: Adult Health

## 2021-10-01 ENCOUNTER — Ambulatory Visit (INDEPENDENT_AMBULATORY_CARE_PROVIDER_SITE_OTHER): Payer: Medicare Other

## 2021-10-01 DIAGNOSIS — I63412 Cerebral infarction due to embolism of left middle cerebral artery: Secondary | ICD-10-CM

## 2021-10-02 LAB — CUP PACEART REMOTE DEVICE CHECK
Date Time Interrogation Session: 20230917231410
Implantable Pulse Generator Implant Date: 20220826

## 2021-10-12 NOTE — Progress Notes (Signed)
Carelink Summary Report / Loop Recorder 

## 2021-11-05 ENCOUNTER — Ambulatory Visit (INDEPENDENT_AMBULATORY_CARE_PROVIDER_SITE_OTHER): Payer: Medicare Other

## 2021-11-05 DIAGNOSIS — I63412 Cerebral infarction due to embolism of left middle cerebral artery: Secondary | ICD-10-CM

## 2021-11-05 LAB — CUP PACEART REMOTE DEVICE CHECK
Date Time Interrogation Session: 20231022231843
Implantable Pulse Generator Implant Date: 20220826

## 2021-11-26 NOTE — Progress Notes (Signed)
Carelink Summary Report / Loop Recorder 

## 2021-12-10 ENCOUNTER — Ambulatory Visit (INDEPENDENT_AMBULATORY_CARE_PROVIDER_SITE_OTHER): Payer: Medicare Other

## 2021-12-10 DIAGNOSIS — I63412 Cerebral infarction due to embolism of left middle cerebral artery: Secondary | ICD-10-CM

## 2021-12-10 LAB — CUP PACEART REMOTE DEVICE CHECK
Date Time Interrogation Session: 20231126231922
Implantable Pulse Generator Implant Date: 20220826

## 2022-01-15 ENCOUNTER — Ambulatory Visit (INDEPENDENT_AMBULATORY_CARE_PROVIDER_SITE_OTHER): Payer: Medicare Other

## 2022-01-15 DIAGNOSIS — I63412 Cerebral infarction due to embolism of left middle cerebral artery: Secondary | ICD-10-CM

## 2022-01-15 LAB — CUP PACEART REMOTE DEVICE CHECK
Date Time Interrogation Session: 20240101231955
Implantable Pulse Generator Implant Date: 20220826

## 2022-01-18 NOTE — Progress Notes (Signed)
Carelink Summary Report / Loop Recorder 

## 2022-02-08 NOTE — Progress Notes (Signed)
[facility] Summary Report / Loop Recorder

## 2022-02-15 LAB — CUP PACEART REMOTE DEVICE CHECK
Date Time Interrogation Session: 20240201230754
Implantable Pulse Generator Implant Date: 20220826

## 2022-02-18 ENCOUNTER — Ambulatory Visit: Payer: 59

## 2022-02-18 DIAGNOSIS — I63412 Cerebral infarction due to embolism of left middle cerebral artery: Secondary | ICD-10-CM | POA: Diagnosis not present

## 2022-03-25 ENCOUNTER — Ambulatory Visit: Payer: 59

## 2022-03-25 DIAGNOSIS — I63412 Cerebral infarction due to embolism of left middle cerebral artery: Secondary | ICD-10-CM

## 2022-03-25 LAB — CUP PACEART REMOTE DEVICE CHECK
Date Time Interrogation Session: 20240310231037
Implantable Pulse Generator Implant Date: 20220826

## 2022-03-29 NOTE — Progress Notes (Signed)
Carelink Summary Report / Loop Recorder 

## 2022-04-29 ENCOUNTER — Ambulatory Visit (INDEPENDENT_AMBULATORY_CARE_PROVIDER_SITE_OTHER): Payer: 59

## 2022-04-29 DIAGNOSIS — I63412 Cerebral infarction due to embolism of left middle cerebral artery: Secondary | ICD-10-CM | POA: Diagnosis not present

## 2022-04-29 LAB — CUP PACEART REMOTE DEVICE CHECK
Date Time Interrogation Session: 20240414231413
Implantable Pulse Generator Implant Date: 20220826

## 2022-04-30 NOTE — Progress Notes (Signed)
Carelink Summary Report / Loop Recorder 

## 2022-05-30 NOTE — Progress Notes (Signed)
Carelink Summary Report / Loop Recorder 

## 2022-06-03 ENCOUNTER — Emergency Department (HOSPITAL_COMMUNITY): Payer: 59

## 2022-06-03 ENCOUNTER — Ambulatory Visit (INDEPENDENT_AMBULATORY_CARE_PROVIDER_SITE_OTHER): Payer: 59

## 2022-06-03 ENCOUNTER — Other Ambulatory Visit: Payer: Self-pay

## 2022-06-03 ENCOUNTER — Inpatient Hospital Stay (HOSPITAL_COMMUNITY)
Admission: EM | Admit: 2022-06-03 | Discharge: 2022-06-07 | DRG: 069 | Disposition: A | Discharge status: Home Health | Payer: 59 | Attending: Internal Medicine | Admitting: Internal Medicine

## 2022-06-03 DIAGNOSIS — I5032 Chronic diastolic (congestive) heart failure: Secondary | ICD-10-CM | POA: Diagnosis present

## 2022-06-03 DIAGNOSIS — D509 Iron deficiency anemia, unspecified: Secondary | ICD-10-CM | POA: Diagnosis present

## 2022-06-03 DIAGNOSIS — Z888 Allergy status to other drugs, medicaments and biological substances status: Secondary | ICD-10-CM

## 2022-06-03 DIAGNOSIS — Z7982 Long term (current) use of aspirin: Secondary | ICD-10-CM

## 2022-06-03 DIAGNOSIS — N3281 Overactive bladder: Secondary | ICD-10-CM | POA: Diagnosis present

## 2022-06-03 DIAGNOSIS — G459 Transient cerebral ischemic attack, unspecified: Secondary | ICD-10-CM | POA: Diagnosis not present

## 2022-06-03 DIAGNOSIS — W448XXA Other foreign body entering into or through a natural orifice, initial encounter: Secondary | ICD-10-CM | POA: Diagnosis not present

## 2022-06-03 DIAGNOSIS — Z91013 Allergy to seafood: Secondary | ICD-10-CM

## 2022-06-03 DIAGNOSIS — Z79899 Other long term (current) drug therapy: Secondary | ICD-10-CM

## 2022-06-03 DIAGNOSIS — Z7984 Long term (current) use of oral hypoglycemic drugs: Secondary | ICD-10-CM

## 2022-06-03 DIAGNOSIS — Z814 Family history of other substance abuse and dependence: Secondary | ICD-10-CM

## 2022-06-03 DIAGNOSIS — R4701 Aphasia: Secondary | ICD-10-CM | POA: Diagnosis present

## 2022-06-03 DIAGNOSIS — I63412 Cerebral infarction due to embolism of left middle cerebral artery: Secondary | ICD-10-CM | POA: Diagnosis not present

## 2022-06-03 DIAGNOSIS — E669 Obesity, unspecified: Secondary | ICD-10-CM | POA: Diagnosis present

## 2022-06-03 DIAGNOSIS — F17210 Nicotine dependence, cigarettes, uncomplicated: Secondary | ICD-10-CM | POA: Diagnosis present

## 2022-06-03 DIAGNOSIS — Z833 Family history of diabetes mellitus: Secondary | ICD-10-CM

## 2022-06-03 DIAGNOSIS — J9601 Acute respiratory failure with hypoxia: Principal | ICD-10-CM

## 2022-06-03 DIAGNOSIS — J690 Pneumonitis due to inhalation of food and vomit: Secondary | ICD-10-CM | POA: Diagnosis not present

## 2022-06-03 DIAGNOSIS — E785 Hyperlipidemia, unspecified: Secondary | ICD-10-CM | POA: Diagnosis present

## 2022-06-03 DIAGNOSIS — H9190 Unspecified hearing loss, unspecified ear: Secondary | ICD-10-CM | POA: Diagnosis present

## 2022-06-03 DIAGNOSIS — E1169 Type 2 diabetes mellitus with other specified complication: Secondary | ICD-10-CM | POA: Diagnosis present

## 2022-06-03 DIAGNOSIS — E8809 Other disorders of plasma-protein metabolism, not elsewhere classified: Secondary | ICD-10-CM | POA: Diagnosis present

## 2022-06-03 DIAGNOSIS — I5033 Acute on chronic diastolic (congestive) heart failure: Secondary | ICD-10-CM | POA: Diagnosis present

## 2022-06-03 DIAGNOSIS — Z6833 Body mass index (BMI) 33.0-33.9, adult: Secondary | ICD-10-CM

## 2022-06-03 DIAGNOSIS — Z95818 Presence of other cardiac implants and grafts: Secondary | ICD-10-CM

## 2022-06-03 DIAGNOSIS — F419 Anxiety disorder, unspecified: Secondary | ICD-10-CM | POA: Diagnosis present

## 2022-06-03 DIAGNOSIS — F05 Delirium due to known physiological condition: Secondary | ICD-10-CM | POA: Diagnosis not present

## 2022-06-03 DIAGNOSIS — Z8673 Personal history of transient ischemic attack (TIA), and cerebral infarction without residual deficits: Secondary | ICD-10-CM

## 2022-06-03 DIAGNOSIS — I110 Hypertensive heart disease with heart failure: Secondary | ICD-10-CM | POA: Diagnosis present

## 2022-06-03 DIAGNOSIS — T17998A Other foreign object in respiratory tract, part unspecified causing other injury, initial encounter: Secondary | ICD-10-CM | POA: Diagnosis not present

## 2022-06-03 LAB — CUP PACEART REMOTE DEVICE CHECK
Date Time Interrogation Session: 20240517230754
Implantable Pulse Generator Implant Date: 20220826

## 2022-06-03 LAB — I-STAT CHEM 8, ED
BUN: 14 mg/dL (ref 8–23)
Calcium, Ion: 1.16 mmol/L (ref 1.15–1.40)
Chloride: 105 mmol/L (ref 98–111)
Creatinine, Ser: 0.5 mg/dL (ref 0.44–1.00)
Glucose, Bld: 101 mg/dL — ABNORMAL HIGH (ref 70–99)
HCT: 36 % (ref 36.0–46.0)
Hemoglobin: 12.2 g/dL (ref 12.0–15.0)
Potassium: 4 mmol/L (ref 3.5–5.1)
Sodium: 140 mmol/L (ref 135–145)
TCO2: 26 mmol/L (ref 22–32)

## 2022-06-03 LAB — DIFFERENTIAL
Abs Immature Granulocytes: 0.06 10*3/uL (ref 0.00–0.07)
Basophils Absolute: 0.1 10*3/uL (ref 0.0–0.1)
Basophils Relative: 1 %
Eosinophils Absolute: 0.4 10*3/uL (ref 0.0–0.5)
Eosinophils Relative: 3 %
Immature Granulocytes: 1 %
Lymphocytes Relative: 28 %
Lymphs Abs: 3.4 10*3/uL (ref 0.7–4.0)
Monocytes Absolute: 0.8 10*3/uL (ref 0.1–1.0)
Monocytes Relative: 7 %
Neutro Abs: 7.5 10*3/uL (ref 1.7–7.7)
Neutrophils Relative %: 60 %

## 2022-06-03 LAB — COMPREHENSIVE METABOLIC PANEL
ALT: 17 U/L (ref 0–44)
AST: 19 U/L (ref 15–41)
Albumin: 3.2 g/dL — ABNORMAL LOW (ref 3.5–5.0)
Alkaline Phosphatase: 85 U/L (ref 38–126)
Anion gap: 10 (ref 5–15)
BUN: 12 mg/dL (ref 8–23)
CO2: 23 mmol/L (ref 22–32)
Calcium: 9 mg/dL (ref 8.9–10.3)
Chloride: 106 mmol/L (ref 98–111)
Creatinine, Ser: 0.71 mg/dL (ref 0.44–1.00)
GFR, Estimated: >60 mL/min (ref >60)
Glucose, Bld: 102 mg/dL — ABNORMAL HIGH (ref 70–99)
Potassium: 3.9 mmol/L (ref 3.5–5.1)
Sodium: 139 mmol/L (ref 135–145)
Total Bilirubin: 0.5 mg/dL (ref 0.3–1.2)
Total Protein: 6.8 g/dL (ref 6.5–8.1)

## 2022-06-03 LAB — CBC
HCT: 37.8 % (ref 36.0–46.0)
Hemoglobin: 11.4 g/dL — ABNORMAL LOW (ref 12.0–15.0)
MCH: 23.4 pg — ABNORMAL LOW (ref 26.0–34.0)
MCHC: 30.2 g/dL (ref 30.0–36.0)
MCV: 77.6 fL — ABNORMAL LOW (ref 80.0–100.0)
Platelets: 376 10*3/uL (ref 150–400)
RBC: 4.87 MIL/uL (ref 3.87–5.11)
RDW: 13.8 % (ref 11.5–15.5)
WBC: 12.2 10*3/uL — ABNORMAL HIGH (ref 4.0–10.5)
nRBC: 0 % (ref 0.0–0.2)

## 2022-06-03 LAB — APTT: aPTT: 25 seconds (ref 24–36)

## 2022-06-03 LAB — ETHANOL: Alcohol, Ethyl (B): <10 mg/dL (ref <10)

## 2022-06-03 LAB — PROTIME-INR
INR: 1 (ref 0.8–1.2)
Prothrombin Time: 13.6 seconds (ref 11.4–15.2)

## 2022-06-03 LAB — CBG MONITORING, ED
Glucose-Capillary: 95 mg/dL (ref 70–99)
Glucose-Capillary: 96 mg/dL (ref 70–99)

## 2022-06-03 MED ORDER — INSULIN ASPART 100 UNIT/ML IJ SOLN
0.0000 [IU] | Freq: Three times a day (TID) | INTRAMUSCULAR | Status: DC
  Administered 2022-06-04 – 2022-06-05 (×3): 1 [IU] via SUBCUTANEOUS
  Administered 2022-06-05: 2 [IU] via SUBCUTANEOUS
  Administered 2022-06-05 – 2022-06-06 (×3): 1 [IU] via SUBCUTANEOUS
  Administered 2022-06-07: 2 [IU] via SUBCUTANEOUS
  Administered 2022-06-07: 1 [IU] via SUBCUTANEOUS

## 2022-06-03 MED ORDER — VITAMIN D 25 MCG (1000 UNIT) PO TABS
1000.0000 [IU] | ORAL_TABLET | Freq: Every day | ORAL | Status: DC
  Administered 2022-06-04 – 2022-06-07 (×4): 1000 [IU] via ORAL
  Filled 2022-06-03 (×4): qty 1

## 2022-06-03 MED ORDER — ATORVASTATIN CALCIUM 40 MG PO TABS
40.0000 mg | ORAL_TABLET | Freq: Every day | ORAL | Status: DC
  Administered 2022-06-04 – 2022-06-07 (×4): 40 mg via ORAL
  Filled 2022-06-03 (×4): qty 1

## 2022-06-03 MED ORDER — VITAMIN B-12 1000 MCG PO TABS
1000.0000 ug | ORAL_TABLET | Freq: Every day | ORAL | Status: DC
  Administered 2022-06-04 – 2022-06-07 (×4): 1000 ug via ORAL
  Filled 2022-06-03 (×4): qty 1

## 2022-06-03 MED ORDER — PROCHLORPERAZINE EDISYLATE 10 MG/2ML IJ SOLN: 5.0000 mg | Freq: Four times a day (QID) | INTRAMUSCULAR | Status: DC | PRN

## 2022-06-03 MED ORDER — LISINOPRIL 20 MG PO TABS
20.0000 mg | ORAL_TABLET | Freq: Every day | ORAL | Status: DC
  Administered 2022-06-04 – 2022-06-07 (×3): 20 mg via ORAL
  Filled 2022-06-03 (×4): qty 1

## 2022-06-03 MED ORDER — ASPIRIN 81 MG PO TBEC
81.0000 mg | DELAYED_RELEASE_TABLET | Freq: Every day | ORAL | Status: DC
  Administered 2022-06-04 – 2022-06-07 (×4): 81 mg via ORAL
  Filled 2022-06-03 (×4): qty 1

## 2022-06-03 MED ORDER — INSULIN ASPART 100 UNIT/ML IJ SOLN: 0.0000 [IU] | Freq: Every day | INTRAMUSCULAR | Status: DC

## 2022-06-03 MED ORDER — ACETAMINOPHEN 325 MG PO TABS: 650.0000 mg | ORAL_TABLET | Freq: Four times a day (QID) | ORAL | Status: DC | PRN

## 2022-06-03 MED ORDER — POLYETHYLENE GLYCOL 3350 17 G PO PACK: 17.0000 g | PACK | Freq: Every day | ORAL | Status: DC | PRN

## 2022-06-03 MED ORDER — ENOXAPARIN SODIUM 40 MG/0.4ML IJ SOSY
40.0000 mg | PREFILLED_SYRINGE | INTRAMUSCULAR | Status: DC
  Administered 2022-06-04 – 2022-06-07 (×4): 40 mg via SUBCUTANEOUS
  Filled 2022-06-03 (×4): qty 0.4

## 2022-06-03 NOTE — ED Triage Notes (Signed)
Arrives via [facility] as a code stroke. LNW 1730. Reports aphasia which resolves en route. EMS also notes perseveration. EMS vitals: 164/78, 95% RA, 88bpm, CBG 100  Previous episode of same in past. H/o afib. Not on thinners. NIH 0 in ER exam. Per stroke team, pt with be Q1H NIH for TIA protocol until 10pm.

## 2022-06-03 NOTE — ED Notes (Signed)
ED TO INPATIENT HANDOFF REPORT  ED Nurse Name and Phone #: [phone] Makaelyn Aponte  S Name/Age/Gender Allison Hughes 72 y.o. female Room/Bed: 020C/020C  Code Status   Code Status: Prior  Home/SNF/Other Home Patient oriented to: self, place, time, and situation Is this baseline? Yes   Triage Complete: Triage complete  Chief Complaint Aphasia [R47.01]  Triage Note Arrives via [facility] as a code stroke. LNW 1730. Reports aphasia which resolves en route. EMS also notes perseveration. EMS vitals: 164/78, 95% RA, 88bpm, CBG 100  Previous episode of same in past. H/o afib. Not on thinners. NIH 0 in ER exam. Per stroke team, pt with be Q1H NIH for TIA protocol until 10pm.    Allergies Allergies  Allergen Reactions   Iodine Swelling   Shrimp [Shellfish Allergy] Swelling    Fresh shrimp    Level of Care/Admitting Diagnosis ED Disposition     ED Disposition  Admit   Condition  --   Comment  Hospital Area: [facility] [[id number]]  Level of Care: Telemetry Medical [104]  May place patient in observation at [facility] or [facility] if equivalent level of care is available:: No  Covid Evaluation: Asymptomatic - no recent exposure (last 10 days) testing not required  Diagnosis: Aphasia [784.3.ICD-9-CM]  Admitting Physician: Darlin Drop [[id number]]  Attending Physician: Darlin Drop [[id number]]          B Medical/Surgery History Past Medical History:  Diagnosis Date   Diabetes mellitus 2006   T2DM   Hyperlipidemia    Hypertension    Obesity (BMI 30-39.9)    Past Surgical History:  Procedure Laterality Date   CESAREAN SECTION     LOOP RECORDER INSERTION N/A 09/08/2020   Procedure: LOOP RECORDER INSERTION;  Surgeon: Regan Lemming, MD;  [facility];  Service: Cardiovascular;  Laterality: N/A;   ORIF ANKLE FRACTURE  05/11/2011   Procedure: OPEN REDUCTION INTERNAL FIXATION (ORIF) ANKLE FRACTURE;  Surgeon: Nadara Mustard, MD;  Location: [facility]  OR;  Service: Orthopedics;  Laterality: Right;   TUBAL LIGATION       A IV Location/Drains/Wounds Patient Lines/Drains/Airways Status     Active Line/Drains/Airways     Name Placement date Placement time Site Days   Peripheral IV 06/03/22 20 G Anterior;Left Forearm 06/03/22  2008  Forearm  less than 1            Intake/Output Last 24 hours No intake or output data in the 24 hours ending 06/03/22 2130  Labs/Imaging Results for orders placed or performed during the hospital encounter of 06/03/22 (from the past 48 hour(s))  CBG monitoring, ED     Status: None   Collection Time: 06/03/22  7:45 PM  Result Value Ref Range   Glucose-Capillary 95 70 - 99 mg/dL    Comment: Glucose reference range applies only to samples taken after fasting for at least 8 hours.  Ethanol     Status: None   Collection Time: 06/03/22  7:50 PM  Result Value Ref Range   Alcohol, Ethyl (B) <10 <10 mg/dL    Comment: (NOTE) Lowest detectable limit for serum alcohol is 10 mg/dL.  For medical purposes only. Performed at [facility], [address], [place], [place] [address]   Protime-INR     Status: None   Collection Time: 06/03/22  7:50 PM  Result Value Ref Range   Prothrombin Time 13.6 11.4 - 15.2 seconds   INR 1.0 0.8 - 1.2  Comment: (NOTE) INR goal varies based on device and disease states. Performed at [facility], [address] [place], [place] [address]   APTT     Status: None   Collection Time: 06/03/22  7:50 PM  Result Value Ref Range   aPTT 25 24 - 36 seconds    Comment: Performed at [facility], [address], [place], [place] [address]  CBC     Status: Abnormal   Collection Time: 06/03/22  7:50 PM  Result Value Ref Range   WBC 12.2 (H) 4.0 - 10.5 K/uL   RBC 4.87 3.87 - 5.11 MIL/uL   Hemoglobin 11.4 (L) 12.0 - 15.0 g/dL   HCT 09.8 11.9 - 14.7 %   MCV 77.6 (L) 80.0 - 100.0 fL   MCH 23.4 (L) 26.0 - 34.0 pg   MCHC 30.2 30.0 - 36.0 g/dL   RDW 82.9 56.2 -  13.0 %   Platelets 376 150 - 400 K/uL   nRBC 0.0 0.0 - 0.2 %    Comment: Performed at [facility], [address] [place], [place] [address]  Differential     Status: None   Collection Time: 06/03/22  7:50 PM  Result Value Ref Range   Neutrophils Relative % 60 %   Neutro Abs 7.5 1.7 - 7.7 K/uL   Lymphocytes Relative 28 %   Lymphs Abs 3.4 0.7 - 4.0 K/uL   Monocytes Relative 7 %   Monocytes Absolute 0.8 0.1 - 1.0 K/uL   Eosinophils Relative 3 %   Eosinophils Absolute 0.4 0.0 - 0.5 K/uL   Basophils Relative 1 %   Basophils Absolute 0.1 0.0 - 0.1 K/uL   Immature Granulocytes 1 %   Abs Immature Granulocytes 0.06 0.00 - 0.07 K/uL    Comment: Performed at [facility], [address] [place], [place] [address]  Comprehensive metabolic panel     Status: Abnormal   Collection Time: 06/03/22  7:50 PM  Result Value Ref Range   Sodium 139 135 - 145 mmol/L   Potassium 3.9 3.5 - 5.1 mmol/L   Chloride 106 98 - 111 mmol/L   CO2 23 22 - 32 mmol/L   Glucose, Bld 102 (H) 70 - 99 mg/dL    Comment: Glucose reference range applies only to samples taken after fasting for at least 8 hours.   BUN 12 8 - 23 mg/dL   Creatinine, Ser 9.52 0.44 - 1.00 mg/dL   Calcium 9.0 8.9 - 84.1 mg/dL   Total Protein 6.8 6.5 - 8.1 g/dL   Albumin 3.2 (L) 3.5 - 5.0 g/dL   AST 19 15 - 41 U/L   ALT 17 0 - 44 U/L   Alkaline Phosphatase 85 38 - 126 U/L   Total Bilirubin 0.5 0.3 - 1.2 mg/dL   GFR, Estimated >32 >44 mL/min    Comment: (NOTE) Calculated using the CKD-EPI Creatinine Equation (2021)    Anion gap 10 5 - 15    Comment: Performed at [facility], [address] [place], [place] [address]  I-stat chem 8, ED     Status: Abnormal   Collection Time: 06/03/22  7:56 PM  Result Value Ref Range   Sodium 140 135 - 145 mmol/L   Potassium 4.0 3.5 - 5.1 mmol/L   Chloride 105 98 - 111 mmol/L   BUN 14 8 - 23 mg/dL   Creatinine, Ser 2.53 0.44 - 1.00 mg/dL   Glucose, Bld 664 (  H) 70 - 99 mg/dL     Comment: Glucose reference range applies only to samples taken after fasting for at least 8 hours.   Calcium, Ion 1.16 1.15 - 1.40 mmol/L   TCO2 26 22 - 32 mmol/L   Hemoglobin 12.2 12.0 - 15.0 g/dL   HCT 16.1 09.6 - 04.5 %   CT HEAD CODE STROKE WO CONTRAST  Result Date: 06/03/2022 CLINICAL DATA:  Code stroke. Neuro deficit, acute, stroke suspected. Aphasia. EXAM: CT HEAD WITHOUT CONTRAST TECHNIQUE: Contiguous axial images were obtained from the base of the skull through the vertex without intravenous contrast. RADIATION DOSE REDUCTION: This exam was performed according to the departmental dose-optimization program which includes automated exposure control, adjustment of the mA and/or kV according to patient size and/or use of iterative reconstruction technique. COMPARISON:  Head CT 09/06/2020. FINDINGS: Brain: No acute hemorrhage. 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 No new loss of gray-white differentiation. No hydrocephalus or extra-axial collection. No mass effect or midline shift. Vascular: No hyperdense vessel or unexpected calcification. Skull: No calvarial fracture or suspicious bone lesion. Skull base is unremarkable. Sinuses/Orbits: Unremarkable. Other: None. ASPECTS [facility] Stroke Program Early CT Score) - Ganglionic level infarction (caudate, lentiform nuclei, internal capsule, insula, M1-M3 cortex): 7 - Supraganglionic infarction (M4-M6 cortex): 3 Total score (0-10 with 10 being normal): 10 IMPRESSION: 1. No acute hemorrhage or evidence of evolving large vessel territory infarct. ASPECT score is 10. 2. 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 Code stroke imaging results were communicated on 06/03/2022 at 8:04 pm to provider Dr. Derry Lory via secure text paging. Electronically Signed   By: Orvan Falconer M.D.    On: 06/03/2022 20:05    Pending Labs Unresulted Labs (From admission, onward)     Start     Ordered   06/03/22 1946  Urine rapid drug screen (hosp performed)  Once,   STAT        06/03/22 1945   06/03/22 1946  Urinalysis, Routine w reflex microscopic -Urine, Clean Catch  Once,   URGENT       Question:  Specimen Source  Answer:  Urine, Clean Catch   06/03/22 1945            Vitals/Pain Today's Vitals   06/03/22 2015 06/03/22 2030 06/03/22 2045 06/03/22 2124  BP: (!) 153/99 (!) 151/132 (!) 126/114 (!) 118/98  Pulse: 87 89 86 98  Resp: 20 (!) 23 (!) 21   Temp:      TempSrc:      SpO2: 93% 92% 94% 96%  Weight:      Height:      PainSc:        Isolation Precautions No active isolations  Medications Medications - No data to display  Mobility walks     Focused Assessments Neuro Assessment Handoff:  Swallow screen pass? Yes    NIH Stroke Scale  Dizziness Present: No Headache Present: No Interval: Other (Comment) Level of Consciousness (1a.)   : Alert, keenly responsive LOC Questions (1b. )   : Answers both questions correctly LOC Commands (1c. )   : Performs both tasks correctly Best Gaze (2. )  : Normal Visual (3. )  : No visual loss Facial Palsy (4. )    : Normal symmetrical movements Motor Arm, Left (5a. )   : No drift Motor Arm, Right (5b. ) :  No drift Motor Leg, Left (6a. )  : No drift Motor Leg, Right (6b. ) : No drift Limb Ataxia (7. ): Absent Sensory (8. )  : Normal, no sensory loss Best Language (9. )  : No aphasia Dysarthria (10. ): Normal Extinction/Inattention (11.)   : No Abnormality Complete NIHSS TOTAL: 0 Last date known well: 06/03/22 Last time known well: 1730 Neuro Assessment: Within Defined Limits Neuro Checks:   Initial (06/03/22 1950)  Has TPA been given? No If patient is a Neuro Trauma and patient is going to OR before floor call report to 4N Charge nurse: [phone] or [phone]   R Recommendations: See Admitting  Provider Note  Report given to:   Additional Notes: Patient is TIA NIH score 0 vital signs wnl a/o x 4 walks

## 2022-06-03 NOTE — ED Notes (Signed)
Assumed care of patient brought from Ct who arrived via ems as code stroke. Stroke ruled out in CT NIH 0 pt to be TIA with Q 1 hour nih scores until 2200. Pt a/o x 4 respirations even and non labored aphasia has resolved will complete and score swallow test and monitor as ordered.

## 2022-06-03 NOTE — ED Provider Notes (Signed)
[place] EMERGENCY DEPARTMENT AT [facility] Provider Note   CSN: [id number] Arrival date & time: 06/03/22  1943  An emergency department physician performed an initial assessment on this suspected stroke patient at 72.  History  Chief Complaint  Patient presents with   Code Stroke    Allison Hughes is a 72 y.o. female.  HPI 72 year old female history of diabetes, hypertension, hyperlipidemia, prior CVA presenting for speech difficulty.  Patient states today she woke up okay.  Around 10 AM someone called and she had difficulty finding her words.  This lasted for about 10 minutes.  She another episode this afternoon that lasted about an hour.  She again had difficulty finding her words and had difficulty using her glucometer to check her blood sugar which she normally can do without difficulty.  She was evaluated the ED as a code stroke.  She currently feels well.  Denies speech difficulty, headache, neck pain, fevers, chills, weakness, numbness, vision changes.  She has had prior stroke before.  No other recent medication changes.     Home Medications Prior to Admission medications   Medication Sig Start Date End Date Taking? Authorizing Provider  ALPRAZolam Prudy Feeler) 1 MG tablet Take 0.5-1 mg by mouth 2 (two) times daily as needed. For anxiety and sleep.    [provider]  aspirin EC 81 MG EC tablet Take 1 tablet (81 mg total) by mouth daily. Swallow whole. 09/09/20   Pokhrel, Rebekah Chesterfield, MD  atorvastatin (LIPITOR) 40 MG tablet Take 40 mg by mouth daily. 08/06/18   [provider]  co-enzyme Q-10 30 MG capsule Take 30 mg by mouth 3 (three) times daily.    [provider]  lisinopril (PRINIVIL,ZESTRIL) 20 MG tablet Take 20 mg by mouth daily.    [provider]  metFORMIN (GLUCOPHAGE-XR) 500 MG 24 hr tablet Take 500 mg by mouth daily. 10/03/20   [provider]  Multiple Vitamin (MULTIVITAMIN ADULT PO) Take by mouth.    [provider]  MYRBETRIQ 50 MG TB24 tablet Take 50 mg by mouth daily. 08/31/18   [provider]  nicotine (NICODERM CQ - DOSED IN MG/24 HOURS) 14 mg/24hr patch Place 1 patch (14 mg total) onto the skin daily. 09/09/20   Pokhrel, Rebekah Chesterfield, MD  Omega-3 Fatty Acids (OMEGA 3 500 PO) Take 1 capsule by mouth daily. 1000 mg    [provider]  TRESIBA FLEXTOUCH 100 UNIT/ML FlexTouch Pen Inject 26 Units into the skin daily. 08/18/20   [provider]  triamcinolone cream (KENALOG) 0.1 % Apply 1 application topically 3 (three) times daily. 09/01/20   [provider]  vitamin B-12 (CYANOCOBALAMIN) 1000 MCG tablet Take 1,000 mcg by mouth daily.      [provider]  VITAMIN D PO Take by mouth.    [provider]      Allergies    Iodine and Shrimp [shellfish allergy]    Review of Systems   Review of Systems  Neurological:  Positive for speech difficulty.  All other systems reviewed and are negative.   Physical Exam Updated Vital Signs BP (!) 133/100   Pulse 87   Temp 98 F (36.7 C) (Oral)   Resp (!) 21   Ht 5\' 2"  (1.575 m)   Wt 82.2 kg   SpO2 95%   BMI 33.15 kg/m  Physical Exam Vitals and nursing note reviewed.  Constitutional:      General: She is not in acute distress.  Appearance: She is well-developed.  HENT:     Head: Normocephalic and atraumatic.     Nose: Nose normal.     Mouth/Throat:     Mouth: Mucous membranes are moist.     Pharynx: Oropharynx is clear.  Eyes:     Extraocular Movements: Extraocular movements intact.     Conjunctiva/sclera: Conjunctivae normal.     Pupils: Pupils are equal, round, and reactive to light.  Cardiovascular:     Rate and Rhythm: Normal rate and regular rhythm.     Heart sounds: No murmur heard. Pulmonary:     Effort: Pulmonary effort is normal. No respiratory distress.     Breath sounds: Normal breath sounds.  Abdominal:     Palpations: Abdomen is soft.     Tenderness: There is no  abdominal tenderness. There is no guarding or rebound.  Musculoskeletal:        General: No swelling.     Cervical back: Neck supple.  Skin:    General: Skin is warm and dry.     Capillary Refill: Capillary refill takes less than 2 seconds.  Neurological:     General: No focal deficit present.     Mental Status: She is alert and oriented to person, place, and time. Mental status is at baseline.     Cranial Nerves: No cranial nerve deficit.     Sensory: No sensory deficit.     Motor: No weakness.     Coordination: Coordination normal.     Comments: Patient awake alert.  No aphasia, no difficulty with word finding or identifying objects.  Normal finger-to-nose bilaterally.  Full strength in the bilateral upper and lower extremities.  No neglect.  Normal sensation all extremities.  No cranial nerve deficits.  Psychiatric:        Mood and Affect: Mood normal.     ED Results / Procedures / Treatments   Labs (all labs ordered are listed, but only abnormal results are displayed) Labs Reviewed  CBC - Abnormal; Notable for the following components:      Result Value   WBC 12.2 (*)    Hemoglobin 11.4 (*)    MCV 77.6 (*)    MCH 23.4 (*)    All other components within normal limits  COMPREHENSIVE METABOLIC PANEL - Abnormal; Notable for the following components:   Glucose, Bld 102 (*)    Albumin 3.2 (*)    All other components within normal limits  I-STAT CHEM 8, ED - Abnormal; Notable for the following components:   Glucose, Bld 101 (*)    All other components within normal limits  ETHANOL  PROTIME-INR  APTT  DIFFERENTIAL  RAPID URINE DRUG SCREEN, HOSP PERFORMED  URINALYSIS, ROUTINE W REFLEX MICROSCOPIC  HIV ANTIBODY (ROUTINE TESTING W REFLEX)  HEMOGLOBIN A1C  LIPID PANEL  CBC  BASIC METABOLIC PANEL  MAGNESIUM  URINALYSIS, W/ REFLEX TO CULTURE (INFECTION SUSPECTED)  CBG MONITORING, ED    EKG None  Radiology CT HEAD CODE STROKE WO CONTRAST  Result Date:  06/03/2022 CLINICAL DATA:  Code stroke. Neuro deficit, acute, stroke suspected. Aphasia. EXAM: CT HEAD WITHOUT CONTRAST TECHNIQUE: Contiguous axial images were obtained from the base of the skull through the vertex without intravenous contrast. RADIATION DOSE REDUCTION: This exam was performed according to the departmental dose-optimization program which includes automated exposure control, adjustment of the mA and/or kV according to patient size and/or use of iterative reconstruction technique. COMPARISON:  Head CT 09/06/2020. FINDINGS: Brain: No acute hemorrhage. Unchanged severe chronic  small-vessel disease with old infarcts in the right superior parietal lobe and left cerebellar hemisphere. No new loss of gray-white differentiation. No hydrocephalus or extra-axial collection. No mass effect or midline shift. Vascular: No hyperdense vessel or unexpected calcification. Skull: No calvarial fracture or suspicious bone lesion. Skull base is unremarkable. Sinuses/Orbits: Unremarkable. Other: None. ASPECTS [facility] Stroke Program Early CT Score) - Ganglionic level infarction (caudate, lentiform nuclei, internal capsule, insula, M1-M3 cortex): 7 - Supraganglionic infarction (M4-M6 cortex): 3 Total score (0-10 with 10 being normal): 10 IMPRESSION: 1. No acute hemorrhage or evidence of evolving large vessel territory infarct. ASPECT score is 10. 2. 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 Code stroke imaging results were communicated on 06/03/2022 at 8:04 pm to provider Dr. Derry Lory via secure text paging. Electronically Signed   By: Orvan Falconer M.D.   On: 06/03/2022 20:05    Procedures Procedures    Medications Ordered in ED Medications  enoxaparin (LOVENOX) injection 40 mg (has no administration in time range)  acetaminophen (TYLENOL) tablet 650 mg (has no administration in time range)  prochlorperazine (COMPAZINE)  injection 5 mg (has no administration in time range)  polyethylene glycol (MIRALAX / GLYCOLAX) packet 17 g (has no administration in time range)  insulin aspart (novoLOG) injection 0-9 Units (has no administration in time range)  insulin aspart (novoLOG) injection 0-5 Units (has no administration in time range)  aspirin EC tablet 81 mg (has no administration in time range)  atorvastatin (LIPITOR) tablet 40 mg (has no administration in time range)  lisinopril (ZESTRIL) tablet 20 mg (has no administration in time range)  cyanocobalamin (VITAMIN B12) tablet 1,000 mcg (has no administration in time range)  cholecalciferol (VITAMIN D3) 25 MCG (1000 UNIT) tablet 1,000 Units (has no administration in time range)    ED Course/ Medical Decision Making/ A&P                             Medical Decision Making Amount and/or Complexity of Data Reviewed Labs: ordered. Radiology: ordered.  Risk Decision regarding hospitalization.   72 year old female presenting for 2 episodes of aphasia.  Vital signs reviewed.  On exam she initially had mild aphasia.  On repeat examination she has normal speech, no focal deficits.  Presentation concerning for TIA.  CT code stroke obtained without evidence of acute hemorrhage or LVO.  Neurology evaluated the patient and her concern for TIA as well.  Her lab work is grossly unremarkable, shows mild leukocytosis but no focal infectious symptoms.  Given concern for TIA, patient discussed with hospitalist service and admitted for further workup.        Final Clinical Impression(s) / ED Diagnoses Final diagnoses:  Aphasia    Rx / DC Orders ED Discharge Orders     None         Fulton Reek, MD 06/03/22 2303    Melene Plan, DO 06/03/22 2309

## 2022-06-03 NOTE — Consult Note (Signed)
NEUROLOGY CONSULTATION NOTE   Date of service: Jun 03, 2022 Patient Name: Allison Hughes MRN:  [id number] DOB:  1950-02-27 Reason for consult: "Aphasia" Requesting Provider: Default, Provider, MD _ _ _   _ __   _ __ _ _  __ __   _ __   __ _  History of Present Illness  Allison Hughes is a 72 y.o. female with PMH significant for smokes 1ppd, DM2, HTN, HLD, obesity who was talking to he significant other and developed sudden onset word finding difficulty. Speech was word salad. Had a similar episode back in 2022 and at that time, MRI demonstrated L posterior parietal lobe infarct at that time. She had loop recorder placed.  She reports had a 10 mins episode of word finding difficulty in AM today tha spontaneously resolved.  LKW: 1730 mRS: 0 tNKASE: not offered, rapidly resolving symptoms and too mild to treat at this time. Thrombectomy: not offered, rapidly resolving symptoms and too mild to treat at this time. NIHSS components Score: Comment  1a Level of Conscious 0[x]  1[]  2[]  3[]      1b LOC Questions 0[]  1[x]  2[]       1c LOC Commands 0[x]  1[]  2[]       2 Best Gaze 0[x]  1[]  2[]       3 Visual 0[x]  1[]  2[]  3[]      4 Facial Palsy 0[x]  1[]  2[]  3[]      5a Motor Arm - left 0[x]  1[]  2[]  3[]  4[]  UN[]    5b Motor Arm - Right 0[x]  1[]  2[]  3[]  4[]  UN[]    6a Motor Leg - Left 0[x]  1[]  2[]  3[]  4[]  UN[]    6b Motor Leg - Right 0[x]  1[]  2[]  3[]  4[]  UN[]    7 Limb Ataxia 0[x]  1[]  2[]  3[]  UN[]     8 Sensory 0[x]  1[]  2[]  UN[]      9 Best Language 0[x]  1[]  2[]  3[]      10 Dysarthria 0[x]  1[]  2[]  UN[]      11 Extinct. and Inattention 0[x]  1[]  2[]       TOTAL: 1     ROS   Constitutional Denies weight loss, fever and chills.   HEENT Denies changes in vision and hearing.   Respiratory Denies SOB and cough.   CV Denies palpitations and CP   GI Denies abdominal pain, nausea, vomiting and diarrhea.   GU Denies dysuria and urinary frequency.   MSK Denies myalgia and joint pain.   Skin Denies rash and pruritus.    Neurological Denies headache and syncope.   Psychiatric Denies recent changes in mood. Denies anxiety and depression.    Past History   Past Medical History:  Diagnosis Date   Diabetes mellitus 2006   T2DM   Hyperlipidemia    Hypertension    Obesity (BMI 30-39.9)    Past Surgical History:  Procedure Laterality Date   CESAREAN SECTION     LOOP RECORDER INSERTION N/A 09/08/2020   Procedure: LOOP RECORDER INSERTION;  Surgeon: Regan Lemming, MD;  Location: [facility] INVASIVE CV LAB;  Service: Cardiovascular;  Laterality: N/A;   ORIF ANKLE FRACTURE  05/11/2011   Procedure: OPEN REDUCTION INTERNAL FIXATION (ORIF) ANKLE FRACTURE;  Surgeon: Nadara Mustard, MD;  Location: [facility] OR;  Service: Orthopedics;  Laterality: Right;   TUBAL LIGATION     Family History  Problem Relation Age of Onset   Drug abuse Daughter    Diabetes Daughter    Social History   Socioeconomic History   Marital status: Divorced    Spouse name:  Not on file   Number of children: Not on file   Years of education: Not on file   Highest education level: Not on file  Occupational History   Not on file  Tobacco Use   Smoking status: Every Day    Packs/day: 1.00    Years: 40.00    Additional pack years: 0.00    Total pack years: 40.00    Types: Cigarettes   Smokeless tobacco: Former    Quit date: 05/11/2011  Substance and Sexual Activity   Alcohol use: No   Drug use: No   Sexual activity: Never  Other Topics Concern   Not on file  Social History Narrative   Not on file   Social Determinants of Health   Financial Resource Strain: Not on file  Food Insecurity: Not on file  Transportation Needs: Not on file  Physical Activity: Not on file  Stress: Not on file  Social Connections: Not on file   Allergies  Allergen Reactions   Iodine Swelling   Shrimp [Shellfish Allergy] Swelling    Fresh shrimp    Medications  (Not in a hospital admission)    Vitals   Vitals:   06/03/22 1900  Weight: 82.2 kg   Height: 5\' 2"  (1.575 m)     Body mass index is 33.15 kg/m.  Physical Exam   General: Laying comfortably in bed; in no acute distress.  HENT: Normal oropharynx and mucosa. Normal external appearance of ears and nose.  Neck: Supple, no pain or tenderness  CV: No JVD. No peripheral edema.  Pulmonary: Symmetric Chest rise. Normal respiratory effort.  Abdomen: Soft to touch, non-tender.  Ext: No cyanosis, edema, or deformity  Skin: No rash. Normal palpation of skin.   Musculoskeletal: Normal digits and nails by inspection. No clubbing.   Neurologic Examination  Mental status/Cognition: Alert, oriented to self, place, month and year, good attention.  Speech/language: Fluent, comprehension intact, object naming intact, repetition intact.  Cranial nerves:   CN II Pupils equal and reactive to light, no VF deficits    CN III,IV,VI EOM intact, no gaze preference or deviation, no nystagmus   CN V normal sensation in V1, V2, and V3 segments bilaterally    CN VII no asymmetry, no nasolabial fold flattening    CN VIII Head of hearing BL   CN IX & X normal palatal elevation, no uvular deviation    CN XI 5/5 head turn and 5/5 shoulder shrug bilaterally    CN XII midline tongue protrusion    Motor:  Muscle bulk: normal, tone normal, pronator drift none tremor none Mvmt Root Nerve  Muscle Right Left Comments  SA C5/6 Ax Deltoid 5 5   EF C5/6 Mc Biceps 5 5   EE C6/7/8 Rad Triceps 5 5   WF C6/7 Med FCR     WE C7/8 PIN ECU     F Ab C8/T1 U ADM/FDI 5 5   HF L1/2/3 Fem Illopsoas 5 5   KE L2/3/4 Fem Quad 5 5   DF L4/5 D Peron Tib Ant 5 5   PF S1/2 Tibial Grc/Sol 5 5    Sensation:  Light touch Intact BL   Pin prick    Temperature    Vibration   Proprioception    Coordination/Complex Motor:  - Finger to Nose intact BL - Gait: deferred  Labs   CBC: No results for input(s): "WBC", "NEUTROABS", "HGB", "HCT", "MCV", "PLT" in the last 168 hours.  Basic Metabolic Panel:  Lab Results   Component Value Date   NA 140 09/08/2020   K 3.7 09/08/2020   CO2 22 09/08/2020   GLUCOSE 118 (H) 09/08/2020   BUN 12 09/08/2020   CREATININE 0.52 09/08/2020   CALCIUM 9.6 09/08/2020   GFRNONAA >60 09/08/2020   GFRAA >60 02/05/2016   Lipid Panel:  Lab Results  Component Value Date   LDLCALC 68 09/07/2020   HgbA1c:  Lab Results  Component Value Date   HGBA1C 8.6 (H) 09/06/2020   Urine Drug Screen:     Component Value Date/Time   LABOPIA NONE DETECTED 09/06/2020 1647   COCAINSCRNUR NONE DETECTED 09/06/2020 1647   LABBENZ POSITIVE (A) 09/06/2020 1647   AMPHETMU NONE DETECTED 09/06/2020 1647   THCU NONE DETECTED 09/06/2020 1647   LABBARB NONE DETECTED 09/06/2020 1647    Alcohol Level     Component Value Date/Time   ETH <10 09/06/2020 1307    CT Head without contrast(Personally reviewed): CTH was negative for a large hypodensity concerning for a large territory infarct or hyperdensity concerning for an ICH  MR Angio head without contrast and Carotid Duplex BL(Personally reviewed): pending  MRI Brain: pending  Impression   Allison Hughes is a 72 y.o. female with PMH significant for smoker 1ppd, DM2, HTN, HLD, obesity who was talking to he significant other and developed sudden onset word finding difficulty. Her neurologic examination is notable for midl aphasia which was present on my initial evaluation but symptoms completely resolved upon re-evaluation after CT head.  Overall, presentation concerning for a TIA.  Recommendations  - Frequent Neuro checks per stroke unit protocol - Recommend brain imaging with MRI Brain without contrast - Recommend Vascular imaging with MRA Angio Head without contrast and US Carotid doppler - Recommend obtaining TTE - Recommend obtaining Lipid panel with LDL - Please start statin or increase home statin if LDL > 70 - Recommend HbA1c to evaluate for diabetes and how well it is controlled. - Antithrombotic - Aspirin 81mg  daily  along with plavix 75mg  daily x 21 days, followed by Plavix 75mg  daily alone, - Recommend DVT ppx - SBP goal - permissive hypertension first 24 h < 220/110. Held home meds.  - Recommend Telemetry monitoring for arrythmia - Recommend bedside swallow screen prior to PO intake. - Stroke education booklet - Recommend PT/OT/SLP consult - Recommend Urine Tox screen. - pacemaker interrogation - counseled her on the importance of quitting smoking to reduce risk of strokes in the future.  ______________________________________________________________________   Thank you for the opportunity to take part in the care of this patient. If you have any further questions, please contact the neurology consultation attending.  Signed,  Erick Blinks [facility] Pager Number [phone] _ _ _   _ __   _ __ _ _  __ __   _ __   __ _

## 2022-06-03 NOTE — ED Notes (Signed)
Neuro and er md's at bedside

## 2022-06-03 NOTE — H&P (Signed)
History and Physical  [name]:[id number] DOB: 09-01-1950 DOA: 06/03/2022  Referring physician: Dr. Earlene Plater, Resident, EDP  PCP: Ralene Ok, MD  Outpatient Specialists: Neurology. Patient coming from: Home  Chief Complaint: Code stroke, called by EMS  HPI: Allison Hughes is a 72 y.o. female with medical history significant for obesity, uncontrolled type 2 diabetes, hypertension, hyperlipidemia, tobacco use disorder, recurrent TIA with aphasia, who presented to [facility] ED as a code stroke from home.  The patient was noted to be aphasic intermittently, lasting 10 minutes at a time.  Last known well around 10 AM.  In the ED her aphasia resolved.  Seen by neurology/stroke team, noncontrast head CT was nonacute.  MRI is pending.  Neurology recommended admission for TIA workup.  Admitted by [facility], hospitalist service.  At the time of this visit, the patient has no new complaints.  She is very hard of hearing.  Used note writing as an additional mode of communication.  ED Course: Temperature 98.  BP 132/100, pulse 87, respiration rate 18, O2 saturation 95% on room air.  Lab studies unremarkable except for mild leukocytosis 12.2.  UA is pending.  No upper respiratory symptoms reported.  Review of Systems: Review of systems as noted in the HPI. All other systems reviewed and are negative.   Past Medical History:  Diagnosis Date   Diabetes mellitus 2006   T2DM   Hyperlipidemia    Hypertension    Obesity (BMI 30-39.9)    Past Surgical History:  Procedure Laterality Date   CESAREAN SECTION     LOOP RECORDER INSERTION N/A 09/08/2020   Procedure: LOOP RECORDER INSERTION;  Surgeon: Regan Lemming, MD;  Location: [facility] INVASIVE CV LAB;  Service: Cardiovascular;  Laterality: N/A;   ORIF ANKLE FRACTURE  05/11/2011   Procedure: OPEN REDUCTION INTERNAL FIXATION (ORIF) ANKLE FRACTURE;  Surgeon: Nadara Mustard, MD;  Location: [facility] OR;  Service: Orthopedics;  Laterality: Right;   TUBAL LIGATION       Social History:  reports that she has been smoking cigarettes. She has a 40.00 pack-year smoking history. She quit smokeless tobacco use about 11 years ago. She reports that she does not drink alcohol and does not use drugs.   Allergies  Allergen Reactions   Iodine Swelling   Shrimp [Shellfish Allergy] Swelling    Fresh shrimp    Family History  Problem Relation Age of Onset   Drug abuse Daughter    Diabetes Daughter       Prior to Admission medications   Medication Sig Start Date End Date Taking? Authorizing Provider  ALPRAZolam Prudy Feeler) 1 MG tablet Take 0.5-1 mg by mouth 2 (two) times daily as needed. For anxiety and sleep.    [provider]  aspirin EC 81 MG EC tablet Take 1 tablet (81 mg total) by mouth daily. Swallow whole. 09/09/20   Pokhrel, Rebekah Chesterfield, MD  atorvastatin (LIPITOR) 40 MG tablet Take 40 mg by mouth daily. 08/06/18   [provider]  co-enzyme Q-10 30 MG capsule Take 30 mg by mouth 3 (three) times daily.    [provider]  lisinopril (PRINIVIL,ZESTRIL) 20 MG tablet Take 20 mg by mouth daily.    [provider]  metFORMIN (GLUCOPHAGE-XR) 500 MG 24 hr tablet Take 500 mg by mouth daily. 10/03/20   [provider]  Multiple Vitamin (MULTIVITAMIN ADULT PO) Take by mouth.    [provider]  MYRBETRIQ 50 MG TB24 tablet Take 50 mg by mouth daily. 08/31/18  [provider]  nicotine (NICODERM CQ - DOSED IN MG/24 HOURS) 14 mg/24hr patch Place 1 patch (14 mg total) onto the skin daily. 09/09/20   Pokhrel, Rebekah Chesterfield, MD  Omega-3 Fatty Acids (OMEGA 3 500 PO) Take 1 capsule by mouth daily. 1000 mg    [provider]  TRESIBA FLEXTOUCH 100 UNIT/ML FlexTouch Pen Inject 26 Units into the skin daily. 08/18/20   [provider]  triamcinolone cream (KENALOG) 0.1 % Apply 1 application topically 3 (three) times daily. 09/01/20   [provider]  vitamin B-12 (CYANOCOBALAMIN) 1000 MCG tablet Take 1,000  mcg by mouth daily.      [provider]  VITAMIN D PO Take by mouth.    [provider]    Physical Exam: BP (!) 145/97   Pulse 88   Temp 98 F (36.7 C) (Oral)   Resp (!) 21   Ht 5\' 2"  (1.575 m)   Wt 82.2 kg   SpO2 97%   BMI 33.15 kg/m   General: 72 y.o. year-old female well developed well nourished in no acute distress.  Alert and oriented x3. Cardiovascular: Regular rate and rhythm with no rubs or gallops.  No thyromegaly or JVD noted.  No lower extremity edema. 2/4 pulses in all 4 extremities. Respiratory: Clear to auscultation with no wheezes or rales. Good inspiratory effort. Abdomen: Soft nontender nondistended with normal bowel sounds x4 quadrants. Muskuloskeletal: No cyanosis, clubbing or edema noted bilaterally Neuro: CN II-XII intact, strength, sensation, reflexes Skin: No ulcerative lesions noted or rashes Psychiatry: Judgement and insight appear normal. Mood is appropriate for condition and setting          Labs on Admission:  Basic Metabolic Panel: Recent Labs  Lab 06/03/22 1950 06/03/22 1956  NA 139 140  K 3.9 4.0  CL 106 105  CO2 23  --   GLUCOSE 102* 101*  BUN 12 14  CREATININE 0.71 0.50  CALCIUM 9.0  --    Liver Function Tests: Recent Labs  Lab 06/03/22 1950  AST 19  ALT 17  ALKPHOS 85  BILITOT 0.5  PROT 6.8  ALBUMIN 3.2*   No results for input(s): "LIPASE", "AMYLASE" in the last 168 hours. No results for input(s): "AMMONIA" in the last 168 hours. CBC: Recent Labs  Lab 06/03/22 1950 06/03/22 1956  WBC 12.2*  --   NEUTROABS 7.5  --   HGB 11.4* 12.2  HCT 37.8 36.0  MCV 77.6*  --   PLT 376  --    Cardiac Enzymes: No results for input(s): "CKTOTAL", "CKMB", "CKMBINDEX", "TROPONINI" in the last 168 hours.  BNP (last 3 results) No results for input(s): "BNP" in the last 8760 hours.  ProBNP (last 3 results) No results for input(s): "PROBNP" in the last 8760 hours.  CBG: Recent Labs  Lab 06/03/22 1945  GLUCAP  95    Radiological Exams on Admission: CT HEAD CODE STROKE WO CONTRAST  Result Date: 06/03/2022 CLINICAL DATA:  Code stroke. Neuro deficit, acute, stroke suspected. Aphasia. EXAM: CT HEAD WITHOUT CONTRAST TECHNIQUE: Contiguous axial images were obtained from the base of the skull through the vertex without intravenous contrast. RADIATION DOSE REDUCTION: This exam was performed according to the departmental dose-optimization program which includes automated exposure control, adjustment of the mA and/or kV according to patient size and/or use of iterative reconstruction technique. COMPARISON:  Head CT 09/06/2020. FINDINGS: Brain: No acute hemorrhage. 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 No new loss of gray-white differentiation. No hydrocephalus or extra-axial collection. No mass effect or midline shift. Vascular: No hyperdense vessel or unexpected calcification. Skull: No calvarial fracture or suspicious bone lesion. Skull base is unremarkable. Sinuses/Orbits: Unremarkable. Other: None. ASPECTS [facility] Stroke Program Early CT Score) - Ganglionic level infarction (caudate, lentiform nuclei, internal capsule, insula, M1-M3 cortex): 7 - Supraganglionic infarction (M4-M6 cortex): 3 Total score (0-10 with 10 being normal): 10 IMPRESSION: 1. No acute hemorrhage or evidence of evolving large vessel territory infarct. ASPECT score is 10. 2. 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 Code stroke imaging results were communicated on 06/03/2022 at 8:04 pm to provider Dr. Derry Lory via secure text paging. Electronically Signed   By: Orvan Falconer M.D.   On: 06/03/2022 20:05    EKG: I independently viewed the EKG done and my findings are as followed: None available at the time of this visit.  Assessment/Plan Present on Admission:   Aphasia  Principal Problem:   Aphasia  Aphasia, suspect possible TIA, resolved Noncontrast head CT nonacute Follow MRI brain, 2D echo Lipid panel, A1c Seen by neurology, appreciate assistance.  Leukocytosis, acute etiology, rule out active infective process Follow UA and urine culture, treat if indicated No upper respiratory symptoms Monitor fever curve and WBC  Type 2 diabetes with hyperlipidemia Hold off home oral hypoglycemics Obtain hemoglobin A1c Start insulin sliding scale.  Hyperlipidemia Resume home Lipitor.  Hypertension, BP is not at goal, elevated Resume home lisinopril Monitor vital signs    DVT prophylaxis: Subcu Lovenox daily  Code Status: Full code  Family Communication: None at bedside  Disposition Plan: Admitted to telemetry medical unit  Consults called: Neurology  Admission status: Observation status.   Status is: Observation    Darlin Drop MD [facility] Pager [phone]  If 7PM-7AM, please contact night-coverage [email or web address] Password [facility]  06/03/2022, 10:31 PM

## 2022-06-04 ENCOUNTER — Observation Stay (HOSPITAL_COMMUNITY): Payer: 59

## 2022-06-04 DIAGNOSIS — G459 Transient cerebral ischemic attack, unspecified: Secondary | ICD-10-CM | POA: Diagnosis not present

## 2022-06-04 DIAGNOSIS — R569 Unspecified convulsions: Secondary | ICD-10-CM

## 2022-06-04 DIAGNOSIS — R0609 Other forms of dyspnea: Secondary | ICD-10-CM | POA: Diagnosis not present

## 2022-06-04 DIAGNOSIS — R4701 Aphasia: Secondary | ICD-10-CM | POA: Diagnosis not present

## 2022-06-04 DIAGNOSIS — D509 Iron deficiency anemia, unspecified: Secondary | ICD-10-CM

## 2022-06-04 LAB — ECHOCARDIOGRAM COMPLETE
AR max vel: 1.84 cm2
AV Area VTI: 1.88 cm2
AV Area mean vel: 1.71 cm2
AV Mean grad: 6 mmHg
AV Peak grad: 10.1 mmHg
Ao pk vel: 1.59 m/s
Area-P 1/2: 2.55 cm2
Height: 62 in
S' Lateral: 2.9 cm
Weight: 2899.49 oz

## 2022-06-04 LAB — RAPID URINE DRUG SCREEN, HOSP PERFORMED
Amphetamines: NOT DETECTED
Barbiturates: NOT DETECTED
Benzodiazepines: POSITIVE — AB
Cocaine: NOT DETECTED
Opiates: NOT DETECTED
Tetrahydrocannabinol: NOT DETECTED

## 2022-06-04 LAB — URINALYSIS, ROUTINE W REFLEX MICROSCOPIC
Bilirubin Urine: NEGATIVE
Glucose, UA: NEGATIVE mg/dL
Hgb urine dipstick: NEGATIVE
Ketones, ur: NEGATIVE mg/dL
Nitrite: NEGATIVE
Protein, ur: NEGATIVE mg/dL
Specific Gravity, Urine: 1.005 (ref 1.005–1.030)
pH: 6 (ref 5.0–8.0)

## 2022-06-04 LAB — GLUCOSE, CAPILLARY
Glucose-Capillary: 150 mg/dL — ABNORMAL HIGH (ref 70–99)
Glucose-Capillary: 144 mg/dL — ABNORMAL HIGH (ref 70–99)
Glucose-Capillary: 151 mg/dL — ABNORMAL HIGH (ref 70–99)

## 2022-06-04 LAB — I-STAT ARTERIAL BLOOD GAS, ED
Acid-Base Excess: 1 mmol/L (ref 0.0–2.0)
Bicarbonate: 26 mmol/L (ref 20.0–28.0)
Calcium, Ion: 1.34 mmol/L (ref 1.15–1.40)
HCT: 34 % — ABNORMAL LOW (ref 36.0–46.0)
Hemoglobin: 11.6 g/dL — ABNORMAL LOW (ref 12.0–15.0)
O2 Saturation: 100 %
Patient temperature: 98.1
Potassium: 3.9 mmol/L (ref 3.5–5.1)
Sodium: 136 mmol/L (ref 135–145)
TCO2: 27 mmol/L (ref 22–32)
pCO2 arterial: 42.2 mmHg (ref 32–48)
pH, Arterial: 7.397 (ref 7.35–7.45)
pO2, Arterial: 187 mmHg — ABNORMAL HIGH (ref 83–108)

## 2022-06-04 LAB — LIPID PANEL
Cholesterol: 132 mg/dL (ref 0–200)
HDL: 42 mg/dL (ref >40)
LDL Cholesterol: 57 mg/dL (ref 0–99)
Total CHOL/HDL Ratio: 3.1 RATIO
Triglycerides: 165 mg/dL — ABNORMAL HIGH (ref <150)
VLDL: 33 mg/dL (ref 0–40)

## 2022-06-04 LAB — HEPATIC FUNCTION PANEL
ALT: 17 U/L (ref 0–44)
AST: 21 U/L (ref 15–41)
Albumin: 3.4 g/dL — ABNORMAL LOW (ref 3.5–5.0)
Alkaline Phosphatase: 94 U/L (ref 38–126)
Bilirubin, Direct: <0.1 mg/dL (ref 0.0–0.2)
Total Bilirubin: <0.1 mg/dL — ABNORMAL LOW (ref 0.3–1.2)
Total Protein: 7.2 g/dL (ref 6.5–8.1)

## 2022-06-04 LAB — CBC
HCT: 38.1 % (ref 36.0–46.0)
Hemoglobin: 11.5 g/dL — ABNORMAL LOW (ref 12.0–15.0)
MCH: 23.3 pg — ABNORMAL LOW (ref 26.0–34.0)
MCHC: 30.2 g/dL (ref 30.0–36.0)
MCV: 77.1 fL — ABNORMAL LOW (ref 80.0–100.0)
Platelets: 370 10*3/uL (ref 150–400)
RBC: 4.94 MIL/uL (ref 3.87–5.11)
RDW: 14 % (ref 11.5–15.5)
WBC: 13.5 10*3/uL — ABNORMAL HIGH (ref 4.0–10.5)
nRBC: 0 % (ref 0.0–0.2)

## 2022-06-04 LAB — BASIC METABOLIC PANEL
Anion gap: 11 (ref 5–15)
BUN: 10 mg/dL (ref 8–23)
CO2: 23 mmol/L (ref 22–32)
Calcium: 9.8 mg/dL (ref 8.9–10.3)
Chloride: 104 mmol/L (ref 98–111)
Creatinine, Ser: 0.74 mg/dL (ref 0.44–1.00)
GFR, Estimated: >60 mL/min (ref >60)
Glucose, Bld: 97 mg/dL (ref 70–99)
Potassium: 4 mmol/L (ref 3.5–5.1)
Sodium: 138 mmol/L (ref 135–145)

## 2022-06-04 LAB — HIV ANTIBODY (ROUTINE TESTING W REFLEX): HIV Screen 4th Generation wRfx: NONREACTIVE

## 2022-06-04 LAB — RPR: RPR Ser Ql: NONREACTIVE

## 2022-06-04 LAB — MAGNESIUM: Magnesium: 1.7 mg/dL (ref 1.7–2.4)

## 2022-06-04 LAB — HEMOGLOBIN A1C
Hgb A1c MFr Bld: 6.6 % — ABNORMAL HIGH (ref 4.8–5.6)
Mean Plasma Glucose: 142.72 mg/dL

## 2022-06-04 LAB — AMMONIA: Ammonia: 13 umol/L (ref 9–35)

## 2022-06-04 LAB — CBG MONITORING, ED: Glucose-Capillary: 126 mg/dL — ABNORMAL HIGH (ref 70–99)

## 2022-06-04 LAB — VITAMIN B12: Vitamin B-12: 1920 pg/mL — ABNORMAL HIGH (ref 180–914)

## 2022-06-04 LAB — TSH: TSH: 0.711 u[IU]/mL (ref 0.350–4.500)

## 2022-06-04 MED ORDER — HALOPERIDOL LACTATE 5 MG/ML IJ SOLN
5.0000 mg | Freq: Once | INTRAMUSCULAR | Status: AC | PRN
  Administered 2022-06-04: 5 mg via INTRAVENOUS
  Filled 2022-06-04: qty 1

## 2022-06-04 MED ORDER — ORAL CARE MOUTH RINSE: 15.0000 mL | OROMUCOSAL | Status: DC | PRN

## 2022-06-04 NOTE — Progress Notes (Addendum)
STROKE TEAM PROGRESS NOTE   INTERVAL HISTORY Her boyfriend is at the bedside.    Patient presented as code stroke 5/20 due to sudden onset of word finding difficulty/"gibberish speech ".  Boyfriend states that gibberish speech lasted for around 15 minutes at around 10 AM yesterday morning and then returned around 5:00 lasting for almost an hour.  On exam in ED this had resolved.  TNK/Thrombectomy intervention was not offered due to rapidly resolving symptoms/too mild to treat.  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  Patient has loop recorder in from previous stroke.  No A-fib seen on interrogation this morning.  Patient was taking aspirin at home, boyfriend denies missed doses.  Will recommend aspirin and Plavix x 3 weeks, followed by Plavix monotherapy due to continued TIA events. Stroke workup continuing; pending EEG and PT/OT evaluations.  Vitals:   06/04/22 1030 06/04/22 1128 06/04/22 1130 06/04/22 1222  BP: 112/63 (!) 128/58 (!) 128/58   Pulse: 91 89 88   Resp: 20 18 20    Temp:  98 F (36.7 C)    TempSrc:  Oral  Oral  SpO2: 92% 95% 97%   Weight:      Height:       CBC:  Recent Labs  Lab 06/03/22 1950 06/03/22 1956 06/04/22 0147 06/04/22 0938  WBC 12.2*  --  13.5*  --   NEUTROABS 7.5  --   --   --   HGB 11.4*   < > 11.5* 11.6*  HCT 37.8   < > 38.1 34.0*  MCV 77.6*  --  77.1*  --   PLT 376  --  370  --    < > = values in this interval not displayed.   Basic Metabolic Panel:  Recent Labs  Lab 06/03/22 1950 06/03/22 1956 06/04/22 0147 06/04/22 0938  NA 139 140 138 136  K 3.9 4.0 4.0 3.9  CL 106 105 104  --   CO2 23  --  23  --   GLUCOSE 102* 101* 97  --   BUN 12 14 10   --   CREATININE 0.71 0.50 0.74  --   CALCIUM 9.0  --  9.8  --   MG  --   --  1.7  --    Lipid Panel:  Recent Labs  Lab 06/04/22 0147  CHOL 132  TRIG 165*  HDL 42  CHOLHDL 3.1  VLDL 33  LDLCALC 57   HgbA1c:  Recent Labs  Lab 06/04/22 0147  HGBA1C 6.6*    Urine Drug Screen:  Recent Labs  Lab 06/04/22 0954  LABOPIA NONE DETECTED  COCAINSCRNUR NONE DETECTED  LABBENZ POSITIVE*  AMPHETMU NONE DETECTED  THCU NONE DETECTED  LABBARB NONE DETECTED    Alcohol Level  Recent Labs  Lab 06/03/22 1950  ETH <10    IMAGING past 24 hours MR BRAIN WO CONTRAST  Result Date: 06/04/2022 CLINICAL DATA:  Neuro deficit, acute, stroke suspected. EXAM: MRI HEAD WITHOUT CONTRAST TECHNIQUE: Multiplanar, multiecho pulse sequences of the brain and surrounding structures were obtained without intravenous contrast. COMPARISON:  Head CT 06/03/2022.  MRI brain 09/07/2020. FINDINGS: Brain: No acute infarct or hemorrhage. Unchanged severe chronic small-vessel disease with old infarcts in the right parietal lobe, right corona radiata and left cerebellar hemisphere. No mass or midline shift. No hydrocephalus or extra-axial collection. No abnormal susceptibility. Vascular: Normal flow voids. Skull and upper cervical spine: Normal marrow signal. Sinuses/Orbits: Unremarkable. Other: None. IMPRESSION: 1. No acute intracranial  process. 2. Unchanged severe chronic small-vessel disease with old infarcts in the right parietal lobe, right corona radiata and left cerebellar hemisphere. Electronically Signed   By: Orvan Falconer M.D.   On: 06/04/2022 11:45   DG CHEST PORT 1 VIEW  Result Date: 06/04/2022 CLINICAL DATA:  [id number] Acute respiratory failure with hypoxia (HCC) [id number] EXAM: PORTABLE CHEST 1 VIEW COMPARISON:  CXR 10/05/20 FINDINGS: Loop recorder device projects over the left hemithorax. No pleural effusion. No pneumothorax. There are hazy bibasilar airspace opacities could represent atelectasis or infection. Unchanged cardiac and mediastinal contours. No radiographically apparent displaced rib fractures. Visualized upper abdomen is unremarkable. IMPRESSION: Hazy bibasilar airspace opacities could represent atelectasis or infection. Electronically Signed   By: Lorenza Cambridge M.D.    On: 06/04/2022 10:42   ECHOCARDIOGRAM COMPLETE  Result Date: 06/04/2022    ECHOCARDIOGRAM REPORT   Patient Name:   TYLIAH DENHOLM Date of Exam: 06/04/2022 Medical Rec #:  [id number]      Height:       62.0 in Accession #:    [id number]     Weight:       181.2 lb Date of Birth:  Jun 27, 1950      BSA:          1.833 m Patient Age:    71 years       BP:           138/62 mmHg Patient Gender: F              HR:           92 bpm. Exam Location:  Inpatient Procedure: 2D Echo, Color Doppler and Cardiac Doppler Indications:    R06.9 DOE  History:        Patient has prior history of Echocardiogram examinations, most                 recent 09/07/2020. Arrythmias:Atrial Fibrillation; Risk                 Factors:Hypertension.  Sonographer:    Irving Burton Senior RDCS Referring Phys: [id number] CAROLE N HALL IMPRESSIONS  1. Left ventricular ejection fraction, by estimation, is 65 to 70%. The left ventricle has hyperdynamic function. The left ventricle has no regional wall motion abnormalities. Left ventricular diastolic parameters are consistent with Grade I diastolic dysfunction (impaired relaxation).  2. Right ventricular systolic function is normal. The right ventricular size is normal.  3. The mitral valve is normal in structure. No evidence of mitral valve regurgitation. No evidence of mitral stenosis.  4. The aortic valve is normal in structure. Aortic valve regurgitation is not visualized. No aortic stenosis is present.  5. The inferior vena cava is normal in size with greater than 50% respiratory variability, suggesting right atrial pressure of 3 mmHg. Comparison(s): Prior images reviewed side by side. Diastolic LV parameters are worse, but filling pressure at resr remains normal. FINDINGS  Left Ventricle: Left ventricular ejection fraction, by estimation, is 65 to 70%. The left ventricle has hyperdynamic function. The left ventricle has no regional wall motion abnormalities. The left ventricular internal cavity size was normal  in size. There is no left ventricular hypertrophy. Left ventricular diastolic parameters are consistent with Grade I diastolic dysfunction (impaired relaxation). Normal left ventricular filling pressure. Right Ventricle: The right ventricular size is normal. No increase in right ventricular wall thickness. Right ventricular systolic function is normal. Left Atrium: Left atrial size was normal in size. Right Atrium: Right atrial size was normal in size.  Pericardium: There is no evidence of pericardial effusion. Mitral Valve: The mitral valve is normal in structure. No evidence of mitral valve regurgitation. No evidence of mitral valve stenosis. Tricuspid Valve: The tricuspid valve is normal in structure. Tricuspid valve regurgitation is not demonstrated. No evidence of tricuspid stenosis. Aortic Valve: The aortic valve is normal in structure. Aortic valve regurgitation is not visualized. No aortic stenosis is present. Aortic valve mean gradient measures 6.0 mmHg. Aortic valve peak gradient measures 10.1 mmHg. Aortic valve area, by VTI measures 1.88 cm. Pulmonic Valve: The pulmonic valve was normal in structure. Pulmonic valve regurgitation is not visualized. No evidence of pulmonic stenosis. Aorta: The aortic root is normal in size and structure. Venous: The inferior vena cava is normal in size with greater than 50% respiratory variability, suggesting right atrial pressure of 3 mmHg. IAS/Shunts: No atrial level shunt detected by color flow Doppler.  LEFT VENTRICLE PLAX 2D LVIDd:         3.70 cm   Diastology LVIDs:         2.90 cm   LV e' medial:    6.42 cm/s LV PW:         1.10 cm   LV E/e' medial:  10.2 LV IVS:        0.95 cm   LV e' lateral:   7.07 cm/s LVOT diam:     1.80 cm   LV E/e' lateral: 9.3 LV SV:         63 LV SV Index:   35 LVOT Area:     2.54 cm  RIGHT VENTRICLE RV S prime:     7.62 cm/s TAPSE (M-mode): 1.3 cm LEFT ATRIUM           Index        RIGHT ATRIUM          Index LA Vol (A2C): 18.6 ml 10.15  ml/m  RA Area:     9.01 cm LA Vol (A4C): 25.9 ml 14.13 ml/m  RA Volume:   16.10 ml 8.78 ml/m  AORTIC VALVE AV Area (Vmax):    1.84 cm AV Area (Vmean):   1.71 cm AV Area (VTI):     1.88 cm AV Vmax:           159.00 cm/s AV Vmean:          121.000 cm/s AV VTI:            0.337 m AV Peak Grad:      10.1 mmHg AV Mean Grad:      6.0 mmHg LVOT Vmax:         115.00 cm/s LVOT Vmean:        81.400 cm/s LVOT VTI:          0.249 m LVOT/AV VTI ratio: 0.74  AORTA Ao Root diam: 2.90 cm Ao Asc diam:  3.00 cm MITRAL VALVE MV Area (PHT): 2.55 cm    SHUNTS MV Decel Time: 298 msec    Systemic VTI:  0.25 m MV E velocity: 65.60 cm/s  Systemic Diam: 1.80 cm MV A velocity: 92.00 cm/s MV E/A ratio:  0.71 Mihai Croitoru MD Electronically signed by Thurmon Fair MD Signature Date/Time: 06/04/2022/9:18:44 AM    Final    CT HEAD CODE STROKE WO CONTRAST  Result Date: 06/03/2022 CLINICAL DATA:  Code stroke. Neuro deficit, acute, stroke suspected. Aphasia. EXAM: CT HEAD WITHOUT CONTRAST TECHNIQUE: Contiguous axial images were obtained from the base of the skull through the vertex without  intravenous contrast. RADIATION DOSE REDUCTION: This exam was performed according to the departmental dose-optimization program which includes automated exposure control, adjustment of the mA and/or kV according to patient size and/or use of iterative reconstruction technique. COMPARISON:  Head CT 09/06/2020. FINDINGS: Brain: No acute hemorrhage. 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 No new loss of gray-white differentiation. No hydrocephalus or extra-axial collection. No mass effect or midline shift. Vascular: No hyperdense vessel or unexpected calcification. Skull: No calvarial fracture or suspicious bone lesion. Skull base is unremarkable. Sinuses/Orbits: Unremarkable. Other: None. ASPECTS [facility] Stroke Program Early CT Score) - Ganglionic level  infarction (caudate, lentiform nuclei, internal capsule, insula, M1-M3 cortex): 7 - Supraganglionic infarction (M4-M6 cortex): 3 Total score (0-10 with 10 being normal): 10 IMPRESSION: 1. No acute hemorrhage or evidence of evolving large vessel territory infarct. ASPECT score is 10. 2. 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 Code stroke imaging results were communicated on 06/03/2022 at 8:04 pm to provider Dr. Derry Lory via secure text paging. Electronically Signed   By: Orvan Falconer M.D.   On: 06/03/2022 20:05    PHYSICAL EXAM  General: Sitting up in bed; in no acute distress.  CV: No JVD. No peripheral edema.  Pulmonary: Symmetric Chest rise. Normal respiratory effort.  Abdomen: Soft to touch, non-tender.  Ext: No cyanosis, edema, or deformity  Skin: No rash. Normal palpation of skin.    NEURO Mental status: Alert.  Oriented to self and place but not to month/situation.  Patient does seem confused at times.  Boyfriend states that this is common for her. Speech: Fluent with intact naming and repetition. CN: No visual field deficits, EOMI, no nystagmus, facial movement and sensation intact bilaterally, hard of hearing at baseline, midline tongue protrusion with symmetric head turn. Motor: Spontaneous movement of all extremities with no drift or focal weakness seen Sensation: Symmetric and intact bilaterally. Coordination: Finger-to-nose intact.  No ataxia.  NIHSS:  1a Level of Conscious.: 0 1b LOC Questions: 1 1c LOC Commands: 0 2 Best Gaze: 0 3 Visual: 0 4 Facial Palsy: 0 5a Motor Arm - left: 0 5b Motor Arm - Right:0  6a Motor Leg - Left: 0 6b Motor Leg - Right: 0 7 Limb Ataxia: 0 8 Sensory: 0 9 Best Language: 0 10 Dysarthria: 0 11 Extinct. and Inatten.: 0 TOTAL: 1   ASSESSMENT/PLAN Ms. Allison Hughes is a 72 y.o. female with history of DM2, hypertension, hyperlipidemia, obesity,  current smoker, multiple TIAs presented as code stroke 5/20 due to sudden onset of word finding difficulty/"gibberish speech ".  Boyfriend states that gibberish speech lasted for around 15 minutes at around 10 AM yesterday morning and then returned around 5:00 lasting for almost an hour.  On exam in ED this had resolved.  TNK/Thrombectomy intervention was not offered due to rapidly resolving symptoms/too mild to treat.  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 Patient has loop recorder in from previous stroke.  No A-fib seen on interrogation this morning.  Patient was taking aspirin at home, boyfriend denies missed doses.  Will recommend aspirin and Plavix x 3 weeks, followed by Plavix monotherapy due to continued TIA events.   Left hemispheric TIA  Etiology: Chronic small vessel disease secondary to multiple risk factors of previous stroke, current smoker, diabetes, hypertension, hyperlipidemia, obesity. Code Stroke CT head:   No acute abnormality.  Chronic  Small vessel disease with old infarct in right parietal and left cerebellar  ASPECTS 10.     MRI   No acute process Unchanged severe chronic small vessel disease with old infarct in the right parietal lobe, right corona radiata and left cerebellar hemisphere  2D Echo  EF 65 to 70%, grade 1 diastolic dysfunction, no shunt.  EEG: pending  LDL 57 HgbA1c 6.6 VTE prophylaxis - lovenox    Diet   Diet heart healthy/carb modified Fluid consistency: Thin   aspirin 81 mg daily prior to admission, now on aspirin 81 mg daily and clopidogrel 75 mg daily for 3 weeks, followed by Plavix monotherapy. Will change from aspirin to Plavix as patient has had mutliple TIA episodes while on asa.  Therapy recommendations:  pending Disposition:  pending  Hypertension Home meds: Lisinopril 20 mg, resumed Long-term BP goal normotensive  Hyperlipidemia Home meds: Lipitor 40 mg, resumed in hospital LDL 57, goal < 70 Continue  statin at discharge  Diabetes type II Controlled Home meds: Ozempic, Tresiba, metformin 500 mg--on hold HgbA1c 6.6, goal < 7.0 CBGs Recent Labs    06/03/22 2313 06/04/22 0817 06/04/22 1221  GLUCAP 96 126* 150*    SSI started  Other Stroke Risk Factors Advanced Age >/= 37  Cigarette smoker, advised to stop smoking Obesity, Body mass index is 33.15 kg/m., BMI >/= 30 associated with increased stroke risk, recommend weight loss, diet and exercise as appropriate  Hx stroke/TIA  Other Active Problems Anxiety Xanax home medication Overactive Bladder Myrbetriq home medication  Hospital day # 0   Pt seen by Neuro NP/APP and later by MD. Note/plan to be edited by MD as needed.    Lynnae January, DNP, AGACNP-BC [facility] Please use AMION for contact information & EPIC for messaging.  STROKE MD NOTE :  I have personally obtained history,examined this patient, reviewed notes, independently viewed imaging studies, participated in medical decision making and plan of care.ROS completed by me personally and pertinent positives fully documented  I have made any additions or clarifications directly to the above note. Agree with note above.  Patient presented with transient episode of gibberish patient expressive aphasia likely left hemispheric TIA.  MRI scan is negative for acute stroke.  Recommend check EEG.  Aspirin and Plavix for 3 weeks followed by Plavix alone and aggressive risk factor modification.  Long discussion with patient and family and answered questions.  Discussed with Dr. Heloise Beecham.  Greater than 50% time during this 50-minute visit were spent in counseling and coordination of care about her TIA and discussion about evaluation and treatment and prevention and answering questions.  Delia Heady, MD Medical Director [facility] Pager: [phone] 06/04/2022 3:49 PM   To contact Stroke Continuity provider, please refer to [email or web address]. After hours,  contact General Neurology

## 2022-06-04 NOTE — Progress Notes (Signed)
TR[facility] night cross cover note:  I was notified by RN that this patient is agitated, pulling at their telemetry leads , peripheral IV, supplemental O2 delivery, and attempting to get out of bed, with these behaviors refractory to attempts at verbal redirection.  In the setting of associated interference with ongoing medical treatment posing potential harm to themself, I have placed orders for Haldol 5 mg IV once prn for agitation as well as order for soft bilateral wrist restraints.    Allison Pigg, DO Hospitalist

## 2022-06-04 NOTE — ED Notes (Signed)
Patient's oxygen saturation down to 86% on 2L per nasal cannula, increased to 4L without improvement. Patient wakes to verbal stimuli, however falls right back asleep. Patient placed on NRB, oxygen saturation up to 95%. Will continue to re-assess patient and attempt to wean from NRB back to nasal cannula. Dr. Marland Mcalpine notified

## 2022-06-04 NOTE — Progress Notes (Signed)
PROGRESS NOTE    Kess Fetty  WUJ:[id number] DOB: Apr 20, 1950 DOA: 06/03/2022 PCP: Ralene Ok, MD   Brief Narrative:  The patient is an obese 72 year old Caucasian female with a past medical history significant for but limited to uncontrolled diabetes mellitus type 2, hypertension, hyperlipidemia, tobacco abuse, recurrent TIAs with aphasia as well as other comorbidities who presented to the [facility] emergency department from home via EMS as a code stroke.  She was noted to be aphasic by her significant other and it was intermittent lasting 10 minutes.  Her last well-known time was 10 AM.  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  At that time the orders placed by cardiology as part of her stroke workup.  In the ED her aphasia resolved and she was seen by the stroke neuroteam and had a noncontrast head CT which was nonacute.  MRI was ordered and she underwent further workup for TIA/stroke workup.  In the ED she was extremely somnolent and to be placed on a nonrebreather and subsequently started waking up slowly.  ABG was ordered.  Currently undergoing stroke workup and awaiting physical therapy evaluation for safe discharge disposition   Assessment and Plan: No notes have been filed under this hospital service. Service: Hospitalist  Aphasia, in the setting of left hemispheric TIA in the setting of chronic small vessel disease -Noncontrast head CT nonacute showed no acute abnormality but did show chronic small vessel disease with old infarct the right parietal and left cerebellar regions and aspects ratio 10 -MRI of the brain done that showed no acute process but did show unchanged severe chronic small vessel disease with old infarct in the right parietal lobe and right corona radiata and left cerebellar -Echocardiogram was done and showed an EF of 65 to 70% with grade 1 diastolic dysfunction and no shunting -As  part of the workup the neurologist recommended EEG which showed that the study was suggestive of cortical dysfunction arising from the left temporal region of nonspecific etiology no seizures or epileptiform discharges seen throughout the recording -The panel as below and hemoglobin A1c was 6.6 -Neurology recommending dual antiplatelet therapy with aspirin and Plavix for 3 weeks followed by Plavix monotherapy.  They are changing her from aspirin to Plavix as the patient had multiple TIA episodes while on aspirin -PT OT to further evaluate and treat and this is still pending -Continue telemetry monitoring and neurochecks per protocol  Leukocytosis, acute etiology, rule out active infective process -Follow UA and urine culture, treat if indicated -WBC Trend: Recent Labs  Lab 06/03/22 1950 06/04/22 0147  WBC 12.2* 13.5*  -No upper respiratory symptoms but she was hypoxic and dropped her saturations I will check an ABG and chest x-ray -Urinalysis done and showed straw-colored appearance with large leukocytes, negative nitrites, few bacteria, 0-5 RBCs per high-power field, 0-5 squamous epithelial cells, 21-50 WBCs and UDS was positive for benzodiazepines -Will consider checking blood cultures x 2 if continues to worsen -Continue Monitor fever curve and WBC   Type 2 diabetes with hyperlipidemia -Hold off home oral hypoglycemics -Obtain hemoglobin A1c and was 6.6 -Start insulin sliding scale. -CBG Trend:  Recent Labs  Lab 06/03/22 1945 06/03/22 2313 06/04/22 0817 06/04/22 1221 06/04/22 1645  GLUCAP 95 96 126* 150* 144*   Somnolence and Drowsiness -UnClear etiology -As above -TSH was 0.711, RPR was nonreactive, B12 was 1920 and ammonia level was 13 ABG  Component Value Date/Time   PHART 7.397 06/04/2022 0938   PCO2ART 42.2 06/04/2022 0938   PO2ART 187 (H) 06/04/2022 0938   HCO3 26.0 06/04/2022 0938   TCO2 27 06/04/2022 0938   O2SAT 100 06/04/2022 1191   -She is a bit more  arousable when I went and saw her in the ED but continues to be a little drowsy so need to continue monitor head imaging is done above  Acute respiratory failure with hypoxia -Unclear etiology but may need further imaging with a CT scan SpO2: 100 % O2 Flow Rate (L/min): 2 L/min -Wean O2 and she will need an ambulatory home O2 screen prior to discharge as well as a repeat chest x-ray -Continuous pulse oximetry maintain O2 saturation greater than 90% Continue supplemental oxygen via nasal cannula wean O2 as tolerated  Microcytic Anemia -Hgb/Hct Trend Recent Labs  Lab 06/03/22 1950 06/03/22 1956 06/04/22 0147 06/04/22 0938  HGB 11.4* 12.2 11.5* 11.6*  HCT 37.8 36.0 38.1 34.0*  MCV 77.6*  --  77.1*  --   -Check anemia panel in the AM -Continue to monitor for signs and symptoms bleeding; no overt bleeding noted -Repeat CBC in a.m.   Hyperlipidemia -Panel done and showed a total cholesterol/HDL ratio 3.1, cholesterol level 132, HDL 42, LDL 57, triglycerides 165, VLDL 33 -Continue with atorvastatin 40 mg p.o. daily   Hypertension, BP is not at goal, elevated -Resume home lisinopril -Continue to monitor vital signs per protocol -Last blood pressure reading was 127/74  Hypoalbuminemia -Patient's Albumin Trend Recent Labs  Lab 06/03/22 1950 06/04/22 0147  ALBUMIN 3.2* 3.4*  -Continue to Monitor and Trend and repeat CMP in the AM    Obesity -Complicates overall prognosis and care -Estimated body mass index is 33.15 kg/m as calculated from the following:   Height as of this encounter: 5\' 2"  (1.575 m).   Weight as of this encounter: 82.2 kg.  -Weight Loss and Dietary Counseling given  DVT prophylaxis: enoxaparin (LOVENOX) injection 40 mg Start: 06/04/22 1000    Code Status: Full Code Family Communication: No family currently at bedside  Disposition Plan:  Level of care: Telemetry Medical Status is: Observation The patient will require care spanning > 2 midnights and  should be moved to inpatient because: Need PT OT to further evaluate and treat for safe discharge disposition and will need to be weaned off of oxygen and her somnolence needs to improve prior to safe discharge   Consultants:  Neurology  Procedures:  As delineated above  Antimicrobials:  Anti-infectives (From admission, onward)    None       Subjective: Seen and examined at bedside in the ED and she was little bit somnolent and drowsy but a bit more arousable now.  Answers questions denies any pain but feels okay.  Denies any other concerns or complaints at this time.  Objective: Vitals:   06/04/22 1400 06/04/22 1500 06/04/22 1549 06/04/22 1600  BP:   108/72   Pulse: 91     Resp: 20 17 (!) 24   Temp:    98.9 F (37.2 C)  TempSrc:    Oral  SpO2: 97%  97%   Weight:      Height:       No intake or output data in the 24 hours ending 06/04/22 1700 Filed Weights   06/03/22 1900  Weight: 82.2 kg   Examination: Physical Exam:  Constitutional: WN/WD obese Caucasian female who is somnolent and drowsy and had to be placed  on nonrebreather  Respiratory: Diminished to auscultation bilaterally with coarse breath sounds, no wheezing, rales, rhonchi or crackles. Normal respiratory effort and patient is not tachypenic. No accessory muscle use.  Wearing supplemental oxygen via nonrebreather for now Cardiovascular: RRR, no murmurs / rubs / gallops. S1 and S2 auscultated. No extremity edema. 2+ pedal pulses. No carotid bruits.  Abdomen: Soft, non-tender, non-distended. No masses palpated. No appreciable hepatosplenomegaly. Bowel sounds positive.  GU: Deferred. Musculoskeletal: No clubbing / cyanosis of digits/nails. No joint deformity upper and lower extremities.  Skin: No rashes, lesions, ulcers limited skin evaluation. No induration; Warm and dry.  Neurologic: Cranial nerves II to XII grossly intact and is somnolent and drowsy but arousable Psychiatric: Slightly impaired judgment and  insight  Data Reviewed: I have personally reviewed following labs and imaging studies  CBC: Recent Labs  Lab 06/03/22 1950 06/03/22 1956 2022/07/02 0147 Jul 02, 2022 0938  WBC 12.2*  --  13.5*  --   NEUTROABS 7.5  --   --   --   HGB 11.4* 12.2 11.5* 11.6*  HCT 37.8 36.0 38.1 34.0*  MCV 77.6*  --  77.1*  --   PLT 376  --  370  --    Basic Metabolic Panel: Recent Labs  Lab 06/03/22 1950 06/03/22 1956 July 02, 2022 0147 July 02, 2022 0938  NA 139 140 138 136  K 3.9 4.0 4.0 3.9  CL 106 105 104  --   CO2 23  --  23  --   GLUCOSE 102* 101* 97  --   BUN 12 14 10   --   CREATININE 0.71 0.50 0.74  --   CALCIUM 9.0  --  9.8  --   MG  --   --  1.7  --    GFR: Estimated Creatinine Clearance: 65 mL/min (by C-G formula based on SCr of 0.74 mg/dL). Liver Function Tests: Recent Labs  Lab 06/03/22 1950 07/02/22 0147  AST 19 21  ALT 17 17  ALKPHOS 85 94  BILITOT 0.5 <0.1*  PROT 6.8 7.2  ALBUMIN 3.2* 3.4*   No results for input(s): "LIPASE", "AMYLASE" in the last 168 hours. Recent Labs  Lab 2022/07/02 1356  AMMONIA 13   Coagulation Profile: Recent Labs  Lab 06/03/22 1950  INR 1.0   Cardiac Enzymes: No results for input(s): "CKTOTAL", "CKMB", "CKMBINDEX", "TROPONINI" in the last 168 hours. BNP (last 3 results) No results for input(s): "PROBNP" in the last 8760 hours. HbA1C: Recent Labs    07-02-22 0147  HGBA1C 6.6*   CBG: Recent Labs  Lab 06/03/22 1945 06/03/22 2313 2022/07/02 0817 07-02-22 1221 Jul 02, 2022 1645  GLUCAP 95 96 126* 150* 144*   Lipid Profile: Recent Labs    07-02-22 0147  CHOL 132  HDL 42  LDLCALC 57  TRIG 165*  CHOLHDL 3.1   Thyroid Function Tests: Recent Labs    07/02/22 0147  TSH 0.711   Anemia Panel: Recent Labs    07/02/22 1356  VITAMINB12 1,920*   Sepsis Labs: No results for input(s): "PROCALCITON", "LATICACIDVEN" in the last 168 hours.  No results found for this or any previous visit (from the past 240 hour(s)).   Radiology  Studies: EEG adult  Result Date: Jul 02, 2022 Charlsie Quest, MD     Jul 02, 2022  3:06 PM Patient Name: Keyondra Welch MRN: [id number] Epilepsy Attending: Charlsie Quest Referring Physician/Provider: Lynnae January, NP Date: 07-02-22 Duration: 22.22 mins Patient history:  72 y.o. female with history of DM2, hypertension, hyperlipidemia, obesity, current smoker, multiple TIAs  presented as code stroke 5/20 due to sudden onset of word finding difficulty/"gibberish speech ". EEG to evaluate for seizure. Level of alertness: Awake, asleep AEDs during EEG study: None Technical aspects: This EEG study was done with scalp electrodes positioned according to the 10-20 International system of electrode placement. Electrical activity was reviewed with band pass filter of 1-70Hz , sensitivity of 7 uV/mm, display speed of 25mm/sec with a 60Hz  notched filter applied as appropriate. EEG data were recorded continuously and digitally stored.  Video monitoring was available and reviewed as appropriate. Description: The posterior dominant rhythm consists of 9-10 Hz activity of moderate voltage (25-35 uV) seen predominantly in posterior head regions, symmetric and reactive to eye opening and eye closing. Sleep was characterized by vertex waves, sleep spindles (12 to 14 Hz), maximal frontocentral region. EEG showed intermittent 3 to 6 Hz theta-delta slowing in left temporal region. Hyperventilation and photic stimulation were not performed.   ABNORMALITY - Intermittent slow, left temporal region IMPRESSION: This study is suggestive of cortical dysfunction arising from left temporal region nonspecific etiology. No seizures or epileptiform discharges were seen throughout the recording. Charlsie Quest   MR BRAIN WO CONTRAST  Result Date: 06/04/2022 CLINICAL DATA:  Neuro deficit, acute, stroke suspected. EXAM: MRI HEAD WITHOUT CONTRAST TECHNIQUE: Multiplanar, multiecho pulse sequences of the brain and surrounding structures were  obtained without intravenous contrast. COMPARISON:  Head CT 06/03/2022.  MRI brain 09/07/2020. FINDINGS: Brain: No acute infarct or hemorrhage. Unchanged severe chronic small-vessel disease with old infarcts in the right parietal lobe, right corona radiata and left cerebellar hemisphere. No mass or midline shift. No hydrocephalus or extra-axial collection. No abnormal susceptibility. Vascular: Normal flow voids. Skull and upper cervical spine: Normal marrow signal. Sinuses/Orbits: Unremarkable. Other: None. IMPRESSION: 1. No acute intracranial process. 2. Unchanged severe chronic small-vessel disease with old infarcts in the right parietal lobe, right corona radiata and left cerebellar hemisphere. Electronically Signed   By: Orvan Falconer M.D.   On: 06/04/2022 11:45   DG CHEST PORT 1 VIEW  Result Date: 06/04/2022 CLINICAL DATA:  [id number] Acute respiratory failure with hypoxia (HCC) [id number] EXAM: PORTABLE CHEST 1 VIEW COMPARISON:  CXR 10/05/20 FINDINGS: Loop recorder device projects over the left hemithorax. No pleural effusion. No pneumothorax. There are hazy bibasilar airspace opacities could represent atelectasis or infection. Unchanged cardiac and mediastinal contours. No radiographically apparent displaced rib fractures. Visualized upper abdomen is unremarkable. IMPRESSION: Hazy bibasilar airspace opacities could represent atelectasis or infection. Electronically Signed   By: Lorenza Cambridge M.D.   On: 06/04/2022 10:42   ECHOCARDIOGRAM COMPLETE  Result Date: 06/04/2022    ECHOCARDIOGRAM REPORT   Patient Name:   LASHON HARTZEL Date of Exam: 06/04/2022 Medical Rec #:  [id number]      Height:       62.0 in Accession #:    [id number]     Weight:       181.2 lb Date of Birth:  [birth year]      BSA:          1.833 m Patient Age:    71 years       BP:           138/62 mmHg Patient Gender: F              HR:           92 bpm. Exam Location:  Inpatient Procedure: 2D Echo, Color Doppler and Cardiac Doppler Indications:     R06.9 DOE  History:  Patient has prior history of Echocardiogram examinations, most                 recent 09/07/2020. Arrythmias:Atrial Fibrillation; Risk                 Factors:Hypertension.  Sonographer:    Irving Burton Senior RDCS Referring Phys: [id number] CAROLE N HALL IMPRESSIONS  1. Left ventricular ejection fraction, by estimation, is 65 to 70%. The left ventricle has hyperdynamic function. The left ventricle has no regional wall motion abnormalities. Left ventricular diastolic parameters are consistent with Grade I diastolic dysfunction (impaired relaxation).  2. Right ventricular systolic function is normal. The right ventricular size is normal.  3. The mitral valve is normal in structure. No evidence of mitral valve regurgitation. No evidence of mitral stenosis.  4. The aortic valve is normal in structure. Aortic valve regurgitation is not visualized. No aortic stenosis is present.  5. The inferior vena cava is normal in size with greater than 50% respiratory variability, suggesting right atrial pressure of 3 mmHg. Comparison(s): Prior images reviewed side by side. Diastolic LV parameters are worse, but filling pressure at resr remains normal. FINDINGS  Left Ventricle: Left ventricular ejection fraction, by estimation, is 65 to 70%. The left ventricle has hyperdynamic function. The left ventricle has no regional wall motion abnormalities. The left ventricular internal cavity size was normal in size. There is no left ventricular hypertrophy. Left ventricular diastolic parameters are consistent with Grade I diastolic dysfunction (impaired relaxation). Normal left ventricular filling pressure. Right Ventricle: The right ventricular size is normal. No increase in right ventricular wall thickness. Right ventricular systolic function is normal. Left Atrium: Left atrial size was normal in size. Right Atrium: Right atrial size was normal in size. Pericardium: There is no evidence of pericardial effusion. Mitral  Valve: The mitral valve is normal in structure. No evidence of mitral valve regurgitation. No evidence of mitral valve stenosis. Tricuspid Valve: The tricuspid valve is normal in structure. Tricuspid valve regurgitation is not demonstrated. No evidence of tricuspid stenosis. Aortic Valve: The aortic valve is normal in structure. Aortic valve regurgitation is not visualized. No aortic stenosis is present. Aortic valve mean gradient measures 6.0 mmHg. Aortic valve peak gradient measures 10.1 mmHg. Aortic valve area, by VTI measures 1.88 cm. Pulmonic Valve: The pulmonic valve was normal in structure. Pulmonic valve regurgitation is not visualized. No evidence of pulmonic stenosis. Aorta: The aortic root is normal in size and structure. Venous: The inferior vena cava is normal in size with greater than 50% respiratory variability, suggesting right atrial pressure of 3 mmHg. IAS/Shunts: No atrial level shunt detected by color flow Doppler.  LEFT VENTRICLE PLAX 2D LVIDd:         3.70 cm   Diastology LVIDs:         2.90 cm   LV e' medial:    6.42 cm/s LV PW:         1.10 cm   LV E/e' medial:  10.2 LV IVS:        0.95 cm   LV e' lateral:   7.07 cm/s LVOT diam:     1.80 cm   LV E/e' lateral: 9.3 LV SV:         63 LV SV Index:   35 LVOT Area:     2.54 cm  RIGHT VENTRICLE RV S prime:     7.62 cm/s TAPSE (M-mode): 1.3 cm LEFT ATRIUM           Index  RIGHT ATRIUM          Index LA Vol (A2C): 18.6 ml 10.15 ml/m  RA Area:     9.01 cm LA Vol (A4C): 25.9 ml 14.13 ml/m  RA Volume:   16.10 ml 8.78 ml/m  AORTIC VALVE AV Area (Vmax):    1.84 cm AV Area (Vmean):   1.71 cm AV Area (VTI):     1.88 cm AV Vmax:           159.00 cm/s AV Vmean:          121.000 cm/s AV VTI:            0.337 m AV Peak Grad:      10.1 mmHg AV Mean Grad:      6.0 mmHg LVOT Vmax:         115.00 cm/s LVOT Vmean:        81.400 cm/s LVOT VTI:          0.249 m LVOT/AV VTI ratio: 0.74  AORTA Ao Root diam: 2.90 cm Ao Asc diam:  3.00 cm MITRAL VALVE MV  Area (PHT): 2.55 cm    SHUNTS MV Decel Time: 298 msec    Systemic VTI:  0.25 m MV E velocity: 65.60 cm/s  Systemic Diam: 1.80 cm MV A velocity: 92.00 cm/s MV E/A ratio:  0.71 Mihai Croitoru MD Electronically signed by Thurmon Fair MD Signature Date/Time: 06/04/2022/9:18:44 AM    Final    CT HEAD CODE STROKE WO CONTRAST  Result Date: 06/03/2022 CLINICAL DATA:  Code stroke. Neuro deficit, acute, stroke suspected. Aphasia. EXAM: CT HEAD WITHOUT CONTRAST TECHNIQUE: Contiguous axial images were obtained from the base of the skull through the vertex without intravenous contrast. RADIATION DOSE REDUCTION: This exam was performed according to the departmental dose-optimization program which includes automated exposure control, adjustment of the mA and/or kV according to patient size and/or use of iterative reconstruction technique. COMPARISON:  Head CT 09/06/2020. FINDINGS: Brain: No acute hemorrhage. 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 No new loss of gray-white differentiation. No hydrocephalus or extra-axial collection. No mass effect or midline shift. Vascular: No hyperdense vessel or unexpected calcification. Skull: No calvarial fracture or suspicious bone lesion. Skull base is unremarkable. Sinuses/Orbits: Unremarkable. Other: None. ASPECTS [facility] Stroke Program Early CT Score) - Ganglionic level infarction (caudate, lentiform nuclei, internal capsule, insula, M1-M3 cortex): 7 - Supraganglionic infarction (M4-M6 cortex): 3 Total score (0-10 with 10 being normal): 10 IMPRESSION: 1. No acute hemorrhage or evidence of evolving large vessel territory infarct. ASPECT score is 10. 2. 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 Code stroke imaging results were communicated on 06/03/2022 at 8:04 pm to provider Dr. Derry Lory via secure  text paging. Electronically Signed   By: Orvan Falconer M.D.   On: 06/03/2022 20:05    Scheduled Meds:  aspirin EC  81 mg Oral Daily   atorvastatin  40 mg Oral Daily   cholecalciferol  1,000 Units Oral Daily   cyanocobalamin  1,000 mcg Oral Daily   enoxaparin (LOVENOX) injection  40 mg Subcutaneous Q24H   insulin aspart  0-5 Units Subcutaneous QHS   insulin aspart  0-9 Units Subcutaneous TID WC   lisinopril  20 mg Oral Daily   Continuous Infusions:   LOS: 0 days   Marguerita Merles, DO [facility] Available via Epic secure chat 7am-7pm After these hours,  please refer to coverage provider listed on [email or web address] 06/04/2022, 5:00 PM

## 2022-06-04 NOTE — Progress Notes (Signed)
OT Cancellation Note  Patient Details Name: Allison Hughes MRN: [id number] DOB: September 06, 1950   Cancelled Treatment:    Reason Eval/Treat Not Completed: Patient at procedure or test/ unavailable (Attempted to see pt for OT evaluation. Pt unavailable to participate at this time. Currently having continuous EEG set-up. Will re-attempt eval at a later time.)  Limmie Patricia, OTR/L,CBIS  Supplemental OT - [facility] and [facility] Secure Chat Preferred  06/04/2022, 1:15 PM

## 2022-06-04 NOTE — Evaluation (Signed)
Physical Therapy Evaluation Patient Details Name: Allison Hughes MRN: [id number] DOB: April 23, 1950 Today's Date: 06/04/2022  History of Present Illness  Pt is 72 yo female who presents on 06/04/22 with aphasia, word finding difficulties and word salad per significant other.  MRI neg for acute changes.  PMHx significant for DMII, HTN and DLD, CVA R parietal, R corona radiata, and L cerebellar regions  Clinical Impression  Pt admitted with above diagnosis. Pt from home with boyfriend where he reports she is usually independent with mobility but he helps her with bathing, shopping, cooking, cleaning. On eval pt is very lethargic and c/o headache. She continues to have receptive and expressive deficits and noted word salad speech. Pt needed min A to come to EOB and mod HHA +2 to ambulate 50'. Pt unsteady and closing eyes while walking. HR low 100's during gait and SPO2 90% on RA. Expect pt will progress and be able to return home with HHPT however, will likely need 24/7 supervision. Pt currently with functional limitations due to the deficits listed below (see PT Problem List). Pt will benefit from acute skilled PT to increase their independence and safety with mobility to allow discharge.          Recommendations for follow up therapy are one component of a multi-disciplinary discharge planning process, led by the attending physician.  Recommendations may be updated based on patient status, additional functional criteria and insurance authorization.  Follow Up Recommendations       Assistance Recommended at Discharge Frequent or constant Supervision/Assistance  Patient can return home with the following  A little help with walking and/or transfers;A little help with bathing/dressing/bathroom;Assistance with cooking/housework;Direct supervision/assist for medications management;Direct supervision/assist for financial management;Assist for transportation;Help with stairs or ramp for entrance     Equipment Recommendations None recommended by PT  Recommendations for Other Services       Functional Status Assessment Patient has had a recent decline in their functional status and demonstrates the ability to make significant improvements in function in a reasonable and predictable amount of time.     Precautions / Restrictions Precautions Precautions: Fall Restrictions Weight Bearing Restrictions: No      Mobility  Bed Mobility Overal bed mobility: Needs Assistance Bed Mobility: Supine to Sit, Sit to Supine     Supine to sit: Min assist Sit to supine: Mod assist   General bed mobility comments: min A needed mainly because pt not following commands to come to EOB so mvmt had to be stimulated. Had some difficulty scooting to EOB. Mod A needed to return to supine and get LE's into bed    Transfers Overall transfer level: Needs assistance Equipment used: Rolling walker (2 wheels), 2 person hand held assist Transfers: Sit to/from Stand Sit to Stand: Min assist           General transfer comment: min A needed to steady and for power up, knees somewhat buckling    Ambulation/Gait Ambulation/Gait assistance: Min assist, +2 safety/equipment, +2 physical assistance Gait Distance (Feet): 50 Feet Assistive device: Rolling walker (2 wheels), 2 person hand held assist Gait Pattern/deviations: Step-through pattern, Staggering left, Staggering right Gait velocity: decreased Gait velocity interpretation: <1.8 ft/sec, indicate of risk for recurrent falls   General Gait Details: began with RW but pt holding it off the floor. Then gave pt handheld assist with boyfriend on other side. Pt unsteady but also closing eyes while ambulating. Upon return to room, pt c/o headache. RN notified after session  Stairs  Wheelchair Mobility    Modified Rankin (Stroke Patients Only) Modified Rankin (Stroke Patients Only) Pre-Morbid Rankin Score: Moderate disability Modified  Rankin: Moderately severe disability     Balance Overall balance assessment: Needs assistance Sitting-balance support: Feet supported, No upper extremity supported Sitting balance-Leahy Scale: Fair Sitting balance - Comments: pt sits with feet off floor, posterior lean, no LOB in static sitting but cannot accept challenge Postural control: Posterior lean Standing balance support: Bilateral upper extremity supported Standing balance-Leahy Scale: Poor Standing balance comment: unsafe without UE support                             Pertinent Vitals/Pain Pain Assessment Pain Assessment: Faces Faces Pain Scale: Hurts even more Pain Location: HA Pain Descriptors / Indicators: Headache Pain Intervention(s): Limited activity within patient's tolerance, Monitored during session, Patient requesting pain meds-RN notified    Home Living Family/patient expects to be discharged to:: Private residence Living Arrangements: Spouse/significant other Available Help at Discharge: Friend(s);Available PRN/intermittently (boyfriend) Type of Home: House Home Access: Stairs to enter Entrance Stairs-Rails: Right;Left;Can reach both Entrance Stairs-Number of Steps: 3-4   Home Layout: One level Home Equipment: Agricultural consultant (2 wheels);Cane - single point;BSC/3in1      Prior Function Prior Level of Function : Needs assist             Mobility Comments: boyfriend reports that she usually walks independently without AD. He does not let her drive ADLs Comments: boyfriend assists her in and out of tub and he does the cooking and cleaning     Hand Dominance        Extremity/Trunk Assessment   Upper Extremity Assessment Upper Extremity Assessment: Generalized weakness    Lower Extremity Assessment Lower Extremity Assessment: Generalized weakness    Cervical / Trunk Assessment Cervical / Trunk Assessment: Normal  Communication   Communication: Receptive difficulties;Expressive  difficulties;HOH  Cognition Arousal/Alertness: Lethargic Behavior During Therapy: Flat affect Overall Cognitive Status: Impaired/Different from baseline Area of Impairment: Orientation, Attention, Memory, Following commands, Safety/judgement, Awareness, Problem solving                 Orientation Level: Disoriented to, Situation, Time Current Attention Level: Focused Memory: Decreased short-term memory Following Commands: Follows one step commands inconsistently Safety/Judgement: Decreased awareness of safety, Decreased awareness of deficits Awareness: Intellectual Problem Solving: Slow processing General Comments: pt very lethargic today, even when up walking. Follows ~50% of commands. Boyfriend reports that at home she is cognitively intach 99% of the time. Today speaks with word salad or makes noises instead of using words.        General Comments General comments (skin integrity, edema, etc.): SPO2 dropped to 90% on RA with ambulation, 2L O2 returned to nose after ambulation    Exercises     Assessment/Plan    PT Assessment Patient needs continued PT services  PT Problem List Decreased strength;Decreased activity tolerance;Decreased balance;Decreased mobility;Decreased cognition;Decreased knowledge of use of DME;Decreased safety awareness;Decreased knowledge of precautions;Pain       PT Treatment Interventions DME instruction;Gait training;Stair training;Functional mobility training;Therapeutic activities;Therapeutic exercise;Balance training;Cognitive remediation;Patient/family education    PT Goals (Current goals can be found in the Care Plan section)  Acute Rehab PT Goals Patient Stated Goal: return home to her dog PT Goal Formulation: With patient/family Time For Goal Achievement: 06/18/22 Potential to Achieve Goals: Good    Frequency Min 3X/week     Co-evaluation  AM-PAC PT "6 Clicks" Mobility  Outcome Measure Help needed turning from  your back to your side while in a flat bed without using bedrails?: A Little Help needed moving from lying on your back to sitting on the side of a flat bed without using bedrails?: A Little Help needed moving to and from a bed to a chair (including a wheelchair)?: A Little Help needed standing up from a chair using your arms (e.g., wheelchair or bedside chair)?: A Little Help needed to walk in hospital room?: Total Help needed climbing 3-5 steps with a railing? : Total 6 Click Score: 14    End of Session Equipment Utilized During Treatment: Gait belt Activity Tolerance: Patient limited by lethargy Patient left: in bed;with call bell/phone within reach;with bed alarm set;with family/visitor present Nurse Communication: Mobility status;Patient requests pain meds PT Visit Diagnosis: Unsteadiness on feet (R26.81);Muscle weakness (generalized) (M62.81);Difficulty in walking, not elsewhere classified (R26.2);Pain Pain - part of body:  (head)    Time: 1600-1630 PT Time Calculation (min) (ACUTE ONLY): 30 min   Charges:   PT Evaluation $PT Eval Moderate Complexity: 1 Mod PT Treatments $Gait Training: 8-22 mins        Lyanne Co, PT  Acute Rehab Services Secure chat preferred Office [phone]   Lawana Chambers Rian Koon 06/04/2022, 5:07 PM

## 2022-06-04 NOTE — Procedures (Signed)
Patient Name: Allison Hughes  MRN: [id number]  Epilepsy Attending: Charlsie Quest  Referring Physician/Provider: Lynnae January, NP  Date: 06/04/2022 Duration: 22.22 mins  Patient history:  72 y.o. female with history of DM2, hypertension, hyperlipidemia, obesity, current smoker, multiple TIAs presented as code stroke 5/20 due to sudden onset of word finding difficulty/"gibberish speech ". EEG to evaluate for seizure.   Level of alertness: Awake, asleep  AEDs during EEG study: None  Technical aspects: This EEG study was done with scalp electrodes positioned according to the 10-20 International system of electrode placement. Electrical activity was reviewed with band pass filter of 1-70Hz , sensitivity of 7 uV/mm, display speed of 65mm/sec with a 60Hz  notched filter applied as appropriate. EEG data were recorded continuously and digitally stored.  Video monitoring was available and reviewed as appropriate.  Description: The posterior dominant rhythm consists of 9-10 Hz activity of moderate voltage (25-35 uV) seen predominantly in posterior head regions, symmetric and reactive to eye opening and eye closing. Sleep was characterized by vertex waves, sleep spindles (12 to 14 Hz), maximal frontocentral region. EEG showed intermittent 3 to 6 Hz theta-delta slowing in left temporal region. Hyperventilation and photic stimulation were not performed.     ABNORMALITY - Intermittent slow, left temporal region  IMPRESSION: This study is suggestive of cortical dysfunction arising from left temporal region nonspecific etiology. No seizures or epileptiform discharges were seen throughout the recording.  Juanmanuel Marohl Annabelle Harman

## 2022-06-04 NOTE — Progress Notes (Signed)
EEG complete - results pending 

## 2022-06-04 NOTE — ED Notes (Signed)
Patient urinated in bed. Patient, turned, cleaned, and changed. Patient desating when asleep. 2L [place] applied.

## 2022-06-04 NOTE — Code Documentation (Signed)
Stroke Response Nurse Documentation Code Documentation  Allison Hughes is a 72 y.o. female arriving to [facility]  via [facility] on 5/20 with past medical hx of afib, DM2, HTN, HLD. On aspirin 81 mg daily. Code stroke was activated by EMS.   Patient from home where she was LKW at 1730 and now complaining of aphasia .   Stroke team at the bedside on patient arrival. Labs drawn and patient cleared for CT by Dr. Adela Lank. Patient to CT with team. NIHSS 0, see documentation for details and code stroke times. Patient with no focal deficits on exam. The following imaging was completed:  CT Head. Patient is not a candidate for IV Thrombolytic due to resolved symptoms. Patient is not a candidate for IR due to low suspicion for LVO.   Care Plan: TIA alert.   Bedside handoff with ED RN [place].    Rose Fillers  Rapid Response RN

## 2022-06-04 NOTE — Progress Notes (Signed)
RN called, MRI coming soon, EEG to be done after MRI

## 2022-06-04 NOTE — Progress Notes (Signed)
Pt confused, agitated, restless, constantly disrobing, pulling off telemetry leads, O2 cannula, and PIV dressing. Requiring frequent redirection and reorientation. Attempted to utilize mittens for reminders, however pt pulled off multiple times. Pt given fluids, declined food and toileted. Pt aware that she is in the hospital but relays that we are keeping her against her will. Attempting to get up unassisted, becoming increasingly agitated with redirection of staff. Dr. Arlean Hopping made aware, new orders noted at this time for restraints and haldol x1, will monitor for effects.

## 2022-06-04 NOTE — TOC Initial Note (Signed)
Transition of Care [facility]) - Initial/Assessment Note    Patient Details  Name: Allison Hughes MRN: [id number] Date of Birth: 07/28/1950  [facility]) CM/SW Contact:    Gordy Clement, RN Phone Number: 06/04/2022, 2:57 PM  Clinical Narrative:       Patient presents from home with Girlfriend as a stroke code. Did require O2 in ED   O2 st down to 86% on 2L  Increased to 4 L without improvement. Placed on NRB and improvement seen. Continue to monitor for O2 needs                TOC will continue to follow patient for any additional discharge needs    Expected Discharge Plan: Home w Home Health Services Barriers to Discharge: Continued Medical Work up   Patient Goals and CMS Choice            Expected Discharge Plan and Services       Living arrangements for the past 2 months: Single Family Home                                      Prior Living Arrangements/Services Living arrangements for the past 2 months: Single Family Home Lives with:: Significant Other            Care giver support system in place?: Yes (comment)      Activities of Daily Living      Permission Sought/Granted                  Emotional Assessment           Psych Involvement: No (comment)  Admission diagnosis:  Aphasia [R47.01] Patient Active Problem List   Diagnosis Date Noted   Aphasia 06/03/2022   Pneumonia 09/06/2020   CVA (cerebral vascular accident) (HCC) 09/06/2020   Type 2 diabetes mellitus without complication, without long-term current use of insulin (HCC) 11/20/2018   Type 2 diabetes mellitus with hyperglycemia, without long-term current use of insulin (HCC) 09/18/2018   Sepsis (HCC) 02/02/2016   Acute pyelonephritis 02/02/2016   Hypokalemia 02/02/2016   Anxiety 02/02/2016   Essential hypertension 02/02/2016   PCP:  Ralene Ok, MD Pharmacy:   [facility] - [place], [place] - [address] [place] [address] Phone: [phone] Fax: [phone]  [facility] - [place], [place] - [address] [facility] [address] [place] [address] [place] [address] Phone: [phone] Fax: [phone]     Social Determinants of Health (SDOH) Social History: SDOH Screenings   Depression (PHQ2-9): Low Risk  (10/26/2020)  Tobacco Use: High Risk (10/26/2020)   SDOH Interventions:     Readmission Risk Interventions     No data to display

## 2022-06-04 NOTE — ED Notes (Signed)
ED TO INPATIENT HANDOFF REPORT  ED Nurse Name and Phone #: Brett Canales [phone]  S Name/Age/Gender Allison Hughes 72 y.o. female Room/Bed: 038C/038C  Code Status   Code Status: Full Code  Home/SNF/Other Home Patient oriented to: self, place, time, and situation Is this baseline? Yes   Triage Complete: Triage complete  Chief Complaint Aphasia [R47.01]  Triage Note Arrives via [facility] as a code stroke. LNW 1730. Reports aphasia which resolves en route. EMS also notes perseveration. EMS vitals: 164/78, 95% RA, 88bpm, CBG 100  Previous episode of same in past. H/o afib. Not on thinners. NIH 0 in ER exam. Per stroke team, pt with be Q1H NIH for TIA protocol until 10pm.    Allergies Allergies  Allergen Reactions   Iodine Swelling   Shrimp [Shellfish Allergy] Swelling    Fresh shrimp    Level of Care/Admitting Diagnosis ED Disposition     ED Disposition  Admit   Condition  --   Comment  Hospital Area: [facility] [[id number]]  Level of Care: Telemetry Medical [104]  May place patient in observation at [facility] or [facility] if equivalent level of care is available:: No  Covid Evaluation: Asymptomatic - no recent exposure (last 10 days) testing not required  Diagnosis: Aphasia [784.3.ICD-9-CM]  Admitting Physician: Darlin Drop [[id number]]  Attending Physician: Darlin Drop [[id number]]          B Medical/Surgery History Past Medical History:  Diagnosis Date   Diabetes mellitus 2006   T2DM   Hyperlipidemia    Hypertension    Obesity (BMI 30-39.9)    Past Surgical History:  Procedure Laterality Date   CESAREAN SECTION     LOOP RECORDER INSERTION N/A 09/08/2020   Procedure: LOOP RECORDER INSERTION;  Surgeon: Regan Lemming, MD;  Location: [facility] INVASIVE CV LAB;  Service: Cardiovascular;  Laterality: N/A;   ORIF ANKLE FRACTURE  05/11/2011   Procedure: OPEN REDUCTION INTERNAL FIXATION (ORIF) ANKLE FRACTURE;  Surgeon: Nadara Mustard, MD;  Location:  [facility] OR;  Service: Orthopedics;  Laterality: Right;   TUBAL LIGATION       A IV Location/Drains/Wounds Patient Lines/Drains/Airways Status     Active Line/Drains/Airways     Name Placement date Placement time Site Days   Peripheral IV 06/03/22 20 G Anterior;Left Forearm 06/03/22  2008  Forearm  1            Intake/Output Last 24 hours No intake or output data in the 24 hours ending 06/04/22 1133  Labs/Imaging Results for orders placed or performed during the hospital encounter of 06/03/22 (from the past 48 hour(s))  CBG monitoring, ED     Status: None   Collection Time: 06/03/22  7:45 PM  Result Value Ref Range   Glucose-Capillary 95 70 - 99 mg/dL    Comment: Glucose reference range applies only to samples taken after fasting for at least 8 hours.  Ethanol     Status: None   Collection Time: 06/03/22  7:50 PM  Result Value Ref Range   Alcohol, Ethyl (B) <10 <10 mg/dL    Comment: (NOTE) Lowest detectable limit for serum alcohol is 10 mg/dL.  For medical purposes only. Performed at [facility], [address], [place], [place] [address]   Protime-INR     Status: None   Collection Time: 06/03/22  7:50 PM  Result Value Ref Range   Prothrombin Time 13.6 11.4 - 15.2 seconds   INR 1.0 0.8 - 1.2  Comment: (NOTE) INR goal varies based on device and disease states. Performed at [facility], [address], [place], [place] [address]   APTT     Status: None   Collection Time: 06/03/22  7:50 PM  Result Value Ref Range   aPTT 25 24 - 36 seconds    Comment: Performed at [facility], [address], [place], [place] [address]  CBC     Status: Abnormal   Collection Time: 06/03/22  7:50 PM  Result Value Ref Range   WBC 12.2 (H) 4.0 - 10.5 K/uL   RBC 4.87 3.87 - 5.11 MIL/uL   Hemoglobin 11.4 (L) 12.0 - 15.0 g/dL   HCT 09.8 11.9 - 14.7 %   MCV 77.6 (L) 80.0 - 100.0 fL   MCH 23.4 (L) 26.0 - 34.0 pg   MCHC 30.2 30.0 - 36.0 g/dL   RDW 82.9 56.2 - 13.0 %    Platelets 376 150 - 400 K/uL   nRBC 0.0 0.0 - 0.2 %    Comment: Performed at [facility], [address], [place], [place] [address]  Differential     Status: None   Collection Time: 06/03/22  7:50 PM  Result Value Ref Range   Neutrophils Relative % 60 %   Neutro Abs 7.5 1.7 - 7.7 K/uL   Lymphocytes Relative 28 %   Lymphs Abs 3.4 0.7 - 4.0 K/uL   Monocytes Relative 7 %   Monocytes Absolute 0.8 0.1 - 1.0 K/uL   Eosinophils Relative 3 %   Eosinophils Absolute 0.4 0.0 - 0.5 K/uL   Basophils Relative 1 %   Basophils Absolute 0.1 0.0 - 0.1 K/uL   Immature Granulocytes 1 %   Abs Immature Granulocytes 0.06 0.00 - 0.07 K/uL    Comment: Performed at [facility], [address], [place], [place] [address]  Comprehensive metabolic panel     Status: Abnormal   Collection Time: 06/03/22  7:50 PM  Result Value Ref Range   Sodium 139 135 - 145 mmol/L   Potassium 3.9 3.5 - 5.1 mmol/L   Chloride 106 98 - 111 mmol/L   CO2 23 22 - 32 mmol/L   Glucose, Bld 102 (H) 70 - 99 mg/dL    Comment: Glucose reference range applies only to samples taken after fasting for at least 8 hours.   BUN 12 8 - 23 mg/dL   Creatinine, Ser 9.52 0.44 - 1.00 mg/dL   Calcium 9.0 8.9 - 84.1 mg/dL   Total Protein 6.8 6.5 - 8.1 g/dL   Albumin 3.2 (L) 3.5 - 5.0 g/dL   AST 19 15 - 41 U/L   ALT 17 0 - 44 U/L   Alkaline Phosphatase 85 38 - 126 U/L   Total Bilirubin 0.5 0.3 - 1.2 mg/dL   GFR, Estimated >32 >44 mL/min    Comment: (NOTE) Calculated using the CKD-EPI Creatinine Equation (2021)    Anion gap 10 5 - 15    Comment: Performed at [facility], [address], [place], [place] [address]  I-stat chem 8, ED     Status: Abnormal   Collection Time: 06/03/22  7:56 PM  Result Value Ref Range   Sodium 140 135 - 145 mmol/L   Potassium 4.0 3.5 - 5.1 mmol/L   Chloride 105 98 - 111 mmol/L   BUN 14 8 - 23 mg/dL   Creatinine, Ser 2.53 0.44 - 1.00 mg/dL   Glucose, Bld 664 (  H) 70 - 99 mg/dL    Comment:  Glucose reference range applies only to samples taken after fasting for at least 8 hours.   Calcium, Ion 1.16 1.15 - 1.40 mmol/L   TCO2 26 22 - 32 mmol/L   Hemoglobin 12.2 12.0 - 15.0 g/dL   HCT 16.1 09.6 - 04.5 %  CBG monitoring, ED     Status: None   Collection Time: 06/03/22 11:13 PM  Result Value Ref Range   Glucose-Capillary 96 70 - 99 mg/dL    Comment: Glucose reference range applies only to samples taken after fasting for at least 8 hours.  HIV Antibody (routine testing w rflx)     Status: None   Collection Time: 06/04/22  1:47 AM  Result Value Ref Range   HIV Screen 4th Generation wRfx Non Reactive Non Reactive    Comment: Performed at [facility], [address] [place], [place] [address]  Hemoglobin A1c     Status: Abnormal   Collection Time: 06/04/22  1:47 AM  Result Value Ref Range   Hgb A1c MFr Bld 6.6 (H) 4.8 - 5.6 %    Comment: (NOTE) Pre diabetes:          5.7%-6.4%  Diabetes:              >6.4%  Glycemic control for   <7.0% adults with diabetes    Mean Plasma Glucose 142.72 mg/dL    Comment: Performed at [facility], [address], [place], [place] [address]  Lipid panel     Status: Abnormal   Collection Time: 06/04/22  1:47 AM  Result Value Ref Range   Cholesterol 132 0 - 200 mg/dL   Triglycerides 829 (H) <150 mg/dL   HDL 42 >56 mg/dL   Total CHOL/HDL Ratio 3.1 RATIO   VLDL 33 0 - 40 mg/dL   LDL Cholesterol 57 0 - 99 mg/dL    Comment:        Total Cholesterol/HDL:CHD Risk Coronary Heart Disease Risk Table                     Men   Women  1/2 Average Risk   3.4   3.3  Average Risk       5.0   4.4  2 X Average Risk   9.6   7.1  3 X Average Risk  23.4   11.0        Use the calculated Patient Ratio above and the CHD Risk Table to determine the patient's CHD Risk.        ATP III CLASSIFICATION (LDL):  <100     mg/dL   Optimal  213-086  mg/dL   Near or Above                    Optimal  130-159  mg/dL   Borderline  578-469  mg/dL    High  >629     mg/dL   Very High Performed at [facility], [address], [place], [place] [address]   CBC     Status: Abnormal   Collection Time: 06/04/22  1:47 AM  Result Value Ref Range   WBC 13.5 (H) 4.0 - 10.5 K/uL   RBC 4.94 3.87 - 5.11 MIL/uL   Hemoglobin 11.5 (L) 12.0 - 15.0 g/dL   HCT 32.4 40.1 - 02.7 %   MCV 77.1 (L) 80.0 - 100.0 fL   MCH 23.3 (L)  26.0 - 34.0 pg   MCHC 30.2 30.0 - 36.0 g/dL   RDW 16.1 09.6 - 04.5 %   Platelets 370 150 - 400 K/uL   nRBC 0.0 0.0 - 0.2 %    Comment: Performed at [facility], [address], [place], [place] [address]  Basic metabolic panel     Status: None   Collection Time: 06/04/22  1:47 AM  Result Value Ref Range   Sodium 138 135 - 145 mmol/L   Potassium 4.0 3.5 - 5.1 mmol/L   Chloride 104 98 - 111 mmol/L   CO2 23 22 - 32 mmol/L   Glucose, Bld 97 70 - 99 mg/dL    Comment: Glucose reference range applies only to samples taken after fasting for at least 8 hours.   BUN 10 8 - 23 mg/dL   Creatinine, Ser 1.91 0.44 - 1.00 mg/dL   Calcium 9.8 8.9 - 47.8 mg/dL   GFR, Estimated >29 >56 mL/min    Comment: (NOTE) Calculated using the CKD-EPI Creatinine Equation (2021)    Anion gap 11 5 - 15    Comment: Performed at [facility], [address] [place], [place] [address]  Magnesium     Status: None   Collection Time: 06/04/22  1:47 AM  Result Value Ref Range   Magnesium 1.7 1.7 - 2.4 mg/dL    Comment: Performed at [facility], [address] [place], [place] [address]  CBG monitoring, ED     Status: Abnormal   Collection Time: 06/04/22  8:17 AM  Result Value Ref Range   Glucose-Capillary 126 (H) 70 - 99 mg/dL    Comment: Glucose reference range applies only to samples taken after fasting for at least 8 hours.  I-Stat arterial blood gas, ED     Status: Abnormal   Collection Time: 06/04/22  9:38 AM  Result Value Ref Range   pH, Arterial 7.397 7.35 - 7.45   pCO2 arterial 42.2 32 - 48 mmHg   pO2, Arterial 187  (H) 83 - 108 mmHg   Bicarbonate 26.0 20.0 - 28.0 mmol/L   TCO2 27 22 - 32 mmol/L   O2 Saturation 100 %   Acid-Base Excess 1.0 0.0 - 2.0 mmol/L   Sodium 136 135 - 145 mmol/L   Potassium 3.9 3.5 - 5.1 mmol/L   Calcium, Ion 1.34 1.15 - 1.40 mmol/L   HCT 34.0 (L) 36.0 - 46.0 %   Hemoglobin 11.6 (L) 12.0 - 15.0 g/dL   Patient temperature 69.6 F    Collection site RADIAL, ALLEN'S TEST ACCEPTABLE    Drawn by RT    Sample type ARTERIAL   Urine rapid drug screen (hosp performed)     Status: Abnormal   Collection Time: 06/04/22  9:54 AM  Result Value Ref Range   Opiates NONE DETECTED NONE DETECTED   Cocaine NONE DETECTED NONE DETECTED   Benzodiazepines POSITIVE (A) NONE DETECTED   Amphetamines NONE DETECTED NONE DETECTED   Tetrahydrocannabinol NONE DETECTED NONE DETECTED   Barbiturates NONE DETECTED NONE DETECTED    Comment: (NOTE) DRUG SCREEN FOR MEDICAL PURPOSES ONLY.  IF CONFIRMATION IS NEEDED FOR ANY PURPOSE, NOTIFY LAB WITHIN 5 DAYS.  LOWEST DETECTABLE LIMITS FOR URINE DRUG SCREEN Drug Class                     Cutoff (ng/mL) Amphetamine and metabolites    1000 Barbiturate and metabolites    200 Benzodiazepine  200 Opiates and metabolites        300 Cocaine and metabolites        300 THC                            50 Performed at [facility], [address], [place], [place] [address]   Urinalysis, Routine w reflex microscopic -Urine, Clean Catch     Status: Abnormal   Collection Time: 06/04/22  9:54 AM  Result Value Ref Range   Color, Urine STRAW (A) YELLOW   APPearance CLEAR CLEAR   Specific Gravity, Urine 1.005 1.005 - 1.030   pH 6.0 5.0 - 8.0   Glucose, UA NEGATIVE NEGATIVE mg/dL   Hgb urine dipstick NEGATIVE NEGATIVE   Bilirubin Urine NEGATIVE NEGATIVE   Ketones, ur NEGATIVE NEGATIVE mg/dL   Protein, ur NEGATIVE NEGATIVE mg/dL   Nitrite NEGATIVE NEGATIVE   Leukocytes,Ua LARGE (A) NEGATIVE   RBC / HPF 0-5 0 - 5 RBC/hpf   WBC, UA 21-50 0 - 5  WBC/hpf   Bacteria, UA FEW (A) NONE SEEN   Squamous Epithelial / HPF 0-5 0 - 5 /HPF    Comment: Performed at [facility], [address] [place], [place] [address]   DG CHEST PORT 1 VIEW  Result Date: 06/04/2022 CLINICAL DATA:  [id number] Acute respiratory failure with hypoxia (HCC) 147829 EXAM: PORTABLE CHEST 1 VIEW COMPARISON:  CXR 10/05/20 FINDINGS: Loop recorder device projects over the left hemithorax. No pleural effusion. No pneumothorax. There are hazy bibasilar airspace opacities could represent atelectasis or infection. Unchanged cardiac and mediastinal contours. No radiographically apparent displaced rib fractures. Visualized upper abdomen is unremarkable. IMPRESSION: Hazy bibasilar airspace opacities could represent atelectasis or infection. Electronically Signed   By: Lorenza Cambridge M.D.   On: 06/04/2022 10:42   ECHOCARDIOGRAM COMPLETE  Result Date: 06/04/2022    ECHOCARDIOGRAM REPORT   Patient Name:   IRSA MARAGH Date of Exam: 06/04/2022 Medical Rec #:  [id number]      Height:       62.0 in Accession #:    [id number]     Weight:       181.2 lb Date of Birth:  1950-09-27      BSA:          1.833 m Patient Age:    71 years       BP:           138/62 mmHg Patient Gender: F              HR:           92 bpm. Exam Location:  Inpatient Procedure: 2D Echo, Color Doppler and Cardiac Doppler Indications:    R06.9 DOE  History:        Patient has prior history of Echocardiogram examinations, most                 recent 09/07/2020. Arrythmias:Atrial Fibrillation; Risk                 Factors:Hypertension.  Sonographer:    Irving Burton Senior RDCS Referring Phys: [id number] CAROLE N HALL IMPRESSIONS  1. Left ventricular ejection fraction, by estimation, is 65 to 70%. The left ventricle has hyperdynamic function. The left ventricle has no regional wall motion abnormalities. Left ventricular diastolic parameters are consistent with Grade I diastolic dysfunction (impaired relaxation).  2. Right ventricular systolic  function is normal. The right ventricular size  is normal.  3. The mitral valve is normal in structure. No evidence of mitral valve regurgitation. No evidence of mitral stenosis.  4. The aortic valve is normal in structure. Aortic valve regurgitation is not visualized. No aortic stenosis is present.  5. The inferior vena cava is normal in size with greater than 50% respiratory variability, suggesting right atrial pressure of 3 mmHg. Comparison(s): Prior images reviewed side by side. Diastolic LV parameters are worse, but filling pressure at resr remains normal. FINDINGS  Left Ventricle: Left ventricular ejection fraction, by estimation, is 65 to 70%. The left ventricle has hyperdynamic function. The left ventricle has no regional wall motion abnormalities. The left ventricular internal cavity size was normal in size. There is no left ventricular hypertrophy. Left ventricular diastolic parameters are consistent with Grade I diastolic dysfunction (impaired relaxation). Normal left ventricular filling pressure. Right Ventricle: The right ventricular size is normal. No increase in right ventricular wall thickness. Right ventricular systolic function is normal. Left Atrium: Left atrial size was normal in size. Right Atrium: Right atrial size was normal in size. Pericardium: There is no evidence of pericardial effusion. Mitral Valve: The mitral valve is normal in structure. No evidence of mitral valve regurgitation. No evidence of mitral valve stenosis. Tricuspid Valve: The tricuspid valve is normal in structure. Tricuspid valve regurgitation is not demonstrated. No evidence of tricuspid stenosis. Aortic Valve: The aortic valve is normal in structure. Aortic valve regurgitation is not visualized. No aortic stenosis is present. Aortic valve mean gradient measures 6.0 mmHg. Aortic valve peak gradient measures 10.1 mmHg. Aortic valve area, by VTI measures 1.88 cm. Pulmonic Valve: The pulmonic valve was normal in structure.  Pulmonic valve regurgitation is not visualized. No evidence of pulmonic stenosis. Aorta: The aortic root is normal in size and structure. Venous: The inferior vena cava is normal in size with greater than 50% respiratory variability, suggesting right atrial pressure of 3 mmHg. IAS/Shunts: No atrial level shunt detected by color flow Doppler.  LEFT VENTRICLE PLAX 2D LVIDd:         3.70 cm   Diastology LVIDs:         2.90 cm   LV e' medial:    6.42 cm/s LV PW:         1.10 cm   LV E/e' medial:  10.2 LV IVS:        0.95 cm   LV e' lateral:   7.07 cm/s LVOT diam:     1.80 cm   LV E/e' lateral: 9.3 LV SV:         63 LV SV Index:   35 LVOT Area:     2.54 cm  RIGHT VENTRICLE RV S prime:     7.62 cm/s TAPSE (M-mode): 1.3 cm LEFT ATRIUM           Index        RIGHT ATRIUM          Index LA Vol (A2C): 18.6 ml 10.15 ml/m  RA Area:     9.01 cm LA Vol (A4C): 25.9 ml 14.13 ml/m  RA Volume:   16.10 ml 8.78 ml/m  AORTIC VALVE AV Area (Vmax):    1.84 cm AV Area (Vmean):   1.71 cm AV Area (VTI):     1.88 cm AV Vmax:           159.00 cm/s AV Vmean:          121.000 cm/s AV VTI:  0.337 m AV Peak Grad:      10.1 mmHg AV Mean Grad:      6.0 mmHg LVOT Vmax:         115.00 cm/s LVOT Vmean:        81.400 cm/s LVOT VTI:          0.249 m LVOT/AV VTI ratio: 0.74  AORTA Ao Root diam: 2.90 cm Ao Asc diam:  3.00 cm MITRAL VALVE MV Area (PHT): 2.55 cm    SHUNTS MV Decel Time: 298 msec    Systemic VTI:  0.25 m MV E velocity: 65.60 cm/s  Systemic Diam: 1.80 cm MV A velocity: 92.00 cm/s MV E/A ratio:  0.71 Mihai Croitoru MD Electronically signed by Thurmon Fair MD Signature Date/Time: 06/04/2022/9:18:44 AM    Final    CT HEAD CODE STROKE WO CONTRAST  Result Date: 06/03/2022 CLINICAL DATA:  Code stroke. Neuro deficit, acute, stroke suspected. Aphasia. EXAM: CT HEAD WITHOUT CONTRAST TECHNIQUE: Contiguous axial images were obtained from the base of the skull through the vertex without intravenous contrast. RADIATION DOSE  REDUCTION: This exam was performed according to the departmental dose-optimization program which includes automated exposure control, adjustment of the mA and/or kV according to patient size and/or use of iterative reconstruction technique. COMPARISON:  Head CT 09/06/2020. FINDINGS: Brain: No acute hemorrhage. 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 No new loss of gray-white differentiation. No hydrocephalus or extra-axial collection. No mass effect or midline shift. Vascular: No hyperdense vessel or unexpected calcification. Skull: No calvarial fracture or suspicious bone lesion. Skull base is unremarkable. Sinuses/Orbits: Unremarkable. Other: None. ASPECTS [facility] Stroke Program Early CT Score) - Ganglionic level infarction (caudate, lentiform nuclei, internal capsule, insula, M1-M3 cortex): 7 - Supraganglionic infarction (M4-M6 cortex): 3 Total score (0-10 with 10 being normal): 10 IMPRESSION: 1. No acute hemorrhage or evidence of evolving large vessel territory infarct. ASPECT score is 10. 2. 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 Code stroke imaging results were communicated on 06/03/2022 at 8:04 pm to provider Dr. Derry Lory via secure text paging. Electronically Signed   By: Orvan Falconer M.D.   On: 06/03/2022 20:05    Pending Labs Unresulted Labs (From admission, onward)     Start     Ordered   06/10/22 0500  Creatinine, serum  (enoxaparin (LOVENOX)    CrCl >/= 30 ml/min)  Weekly,   R     Comments: while on enoxaparin therapy    06/03/22 2225   06/04/22 0920  Ammonia  Add-on,   AD        06/04/22 0919   06/04/22 0920  TSH  Add-on,   AD        06/04/22 0919   06/04/22 0920  RPR  Add-on,   AD        06/04/22 0919   06/04/22 0920  Vitamin B12  Add-on,   AD        06/04/22 0919   06/04/22 0919  Blood gas,  arterial  ONCE - STAT,   STAT        06/04/22 0918   06/04/22 0147  Hepatic function panel  Once,   AD        06/04/22 0147   06/03/22 2244  Urinalysis, w/ Reflex to Culture (Infection Suspected) -Urine, Clean Catch  (Urine Labs)  Once,   R  Question:  Specimen Source  Answer:  Urine, Clean Catch   06/03/22 2243            Vitals/Pain Today's Vitals   06/04/22 0900 06/04/22 1030 06/04/22 1128 06/04/22 1130  BP:  112/63 (!) 128/58 (!) 128/58  Pulse: 88 91 89 88  Resp: 20 20 18 20   Temp:   98 F (36.7 C)   TempSrc:   Oral   SpO2: 94% 92% 95% 97%  Weight:      Height:      PainSc:        Isolation Precautions No active isolations  Medications Medications  enoxaparin (LOVENOX) injection 40 mg (40 mg Subcutaneous Given 06/04/22 1011)  acetaminophen (TYLENOL) tablet 650 mg (has no administration in time range)  prochlorperazine (COMPAZINE) injection 5 mg (has no administration in time range)  polyethylene glycol (MIRALAX / GLYCOLAX) packet 17 g (has no administration in time range)  insulin aspart (novoLOG) injection 0-9 Units ( Subcutaneous Not Given 06/04/22 0820)  insulin aspart (novoLOG) injection 0-5 Units ( Subcutaneous Not Given 06/03/22 2315)  aspirin EC tablet 81 mg (81 mg Oral Given 06/04/22 1011)  atorvastatin (LIPITOR) tablet 40 mg (40 mg Oral Given 06/04/22 1011)  lisinopril (ZESTRIL) tablet 20 mg (20 mg Oral Given 06/04/22 1012)  cyanocobalamin (VITAMIN B12) tablet 1,000 mcg (1,000 mcg Oral Given 06/04/22 1012)  cholecalciferol (VITAMIN D3) 25 MCG (1000 UNIT) tablet 1,000 Units (1,000 Units Oral Given 06/04/22 1011)    Mobility walks     Focused Assessments Neuro Assessment Handoff:  Swallow screen pass? Yes    NIH Stroke Scale  Dizziness Present: No Headache Present: No Interval: Other (Comment) Level of Consciousness (1a.)   : Alert, keenly responsive LOC Questions (1b. )   : Answers both questions correctly LOC Commands (1c. )   : Performs both  tasks correctly Best Gaze (2. )  : Normal Visual (3. )  : No visual loss Facial Palsy (4. )    : Normal symmetrical movements Motor Arm, Left (5a. )   : No drift Motor Arm, Right (5b. ) : No drift Motor Leg, Left (6a. )  : No drift Motor Leg, Right (6b. ) : No drift Limb Ataxia (7. ): Absent Sensory (8. )  : Normal, no sensory loss Best Language (9. )  : No aphasia Dysarthria (10. ): Normal Extinction/Inattention (11.)   : No Abnormality Complete NIHSS TOTAL: 0 Last date known well: 06/03/22 Last time known well: 1730 Neuro Assessment: Within Defined Limits Neuro Checks:   Initial (06/03/22 1950)  Has TPA been given? No If patient is a Neuro Trauma and patient is going to OR before floor call report to 4N Charge nurse: [phone] or [phone]  , Pulmonary Assessment Handoff:  Lung sounds:   O2 Device: Nasal Cannula O2 Flow Rate (L/min): 2 L/min    R Recommendations: See Admitting Provider Note  Report given to:   Additional Notes: Patient is oriented, however husband reports that she will not answer questions appropriately on purpose occasionally. Patient was disoriented and difficult to arouse this morning, but is awake, alert and oriented at this time.

## 2022-06-04 NOTE — Progress Notes (Signed)
  Echocardiogram 2D Echocardiogram has been performed.  Allison Hughes Allison Hughes 06/04/2022, 8:23 AM

## 2022-06-05 ENCOUNTER — Observation Stay (HOSPITAL_COMMUNITY): Payer: 59

## 2022-06-05 DIAGNOSIS — I10 Essential (primary) hypertension: Secondary | ICD-10-CM

## 2022-06-05 DIAGNOSIS — F05 Delirium due to known physiological condition: Secondary | ICD-10-CM | POA: Diagnosis not present

## 2022-06-05 DIAGNOSIS — Z888 Allergy status to other drugs, medicaments and biological substances status: Secondary | ICD-10-CM | POA: Diagnosis not present

## 2022-06-05 DIAGNOSIS — Z6833 Body mass index (BMI) 33.0-33.9, adult: Secondary | ICD-10-CM | POA: Diagnosis not present

## 2022-06-05 DIAGNOSIS — Z833 Family history of diabetes mellitus: Secondary | ICD-10-CM | POA: Diagnosis not present

## 2022-06-05 DIAGNOSIS — E785 Hyperlipidemia, unspecified: Secondary | ICD-10-CM | POA: Diagnosis not present

## 2022-06-05 DIAGNOSIS — Z79899 Other long term (current) drug therapy: Secondary | ICD-10-CM | POA: Diagnosis not present

## 2022-06-05 DIAGNOSIS — J9601 Acute respiratory failure with hypoxia: Secondary | ICD-10-CM | POA: Diagnosis present

## 2022-06-05 DIAGNOSIS — H9190 Unspecified hearing loss, unspecified ear: Secondary | ICD-10-CM | POA: Diagnosis present

## 2022-06-05 DIAGNOSIS — F17210 Nicotine dependence, cigarettes, uncomplicated: Secondary | ICD-10-CM | POA: Diagnosis present

## 2022-06-05 DIAGNOSIS — M7989 Other specified soft tissue disorders: Secondary | ICD-10-CM | POA: Diagnosis not present

## 2022-06-05 DIAGNOSIS — T17998A Other foreign object in respiratory tract, part unspecified causing other injury, initial encounter: Secondary | ICD-10-CM | POA: Diagnosis not present

## 2022-06-05 DIAGNOSIS — D509 Iron deficiency anemia, unspecified: Secondary | ICD-10-CM | POA: Diagnosis present

## 2022-06-05 DIAGNOSIS — Z814 Family history of other substance abuse and dependence: Secondary | ICD-10-CM | POA: Diagnosis not present

## 2022-06-05 DIAGNOSIS — Z91013 Allergy to seafood: Secondary | ICD-10-CM | POA: Diagnosis not present

## 2022-06-05 DIAGNOSIS — I5033 Acute on chronic diastolic (congestive) heart failure: Secondary | ICD-10-CM | POA: Diagnosis present

## 2022-06-05 DIAGNOSIS — E1169 Type 2 diabetes mellitus with other specified complication: Secondary | ICD-10-CM | POA: Diagnosis present

## 2022-06-05 DIAGNOSIS — Z95818 Presence of other cardiac implants and grafts: Secondary | ICD-10-CM | POA: Diagnosis not present

## 2022-06-05 DIAGNOSIS — E8809 Other disorders of plasma-protein metabolism, not elsewhere classified: Secondary | ICD-10-CM | POA: Diagnosis present

## 2022-06-05 DIAGNOSIS — G459 Transient cerebral ischemic attack, unspecified: Secondary | ICD-10-CM | POA: Diagnosis present

## 2022-06-05 DIAGNOSIS — E669 Obesity, unspecified: Secondary | ICD-10-CM | POA: Diagnosis present

## 2022-06-05 DIAGNOSIS — R4701 Aphasia: Secondary | ICD-10-CM | POA: Diagnosis not present

## 2022-06-05 DIAGNOSIS — W448XXA Other foreign body entering into or through a natural orifice, initial encounter: Secondary | ICD-10-CM | POA: Diagnosis not present

## 2022-06-05 DIAGNOSIS — Z8673 Personal history of transient ischemic attack (TIA), and cerebral infarction without residual deficits: Secondary | ICD-10-CM | POA: Diagnosis not present

## 2022-06-05 DIAGNOSIS — Z7982 Long term (current) use of aspirin: Secondary | ICD-10-CM | POA: Diagnosis not present

## 2022-06-05 DIAGNOSIS — J690 Pneumonitis due to inhalation of food and vomit: Secondary | ICD-10-CM | POA: Diagnosis not present

## 2022-06-05 DIAGNOSIS — I110 Hypertensive heart disease with heart failure: Secondary | ICD-10-CM | POA: Diagnosis present

## 2022-06-05 DIAGNOSIS — I5032 Chronic diastolic (congestive) heart failure: Secondary | ICD-10-CM | POA: Diagnosis present

## 2022-06-05 LAB — CBC WITH DIFFERENTIAL/PLATELET
Abs Immature Granulocytes: 0.06 10*3/uL (ref 0.00–0.07)
Basophils Absolute: 0.1 10*3/uL (ref 0.0–0.1)
Basophils Relative: 1 %
Eosinophils Absolute: 0.3 10*3/uL (ref 0.0–0.5)
Eosinophils Relative: 2 %
HCT: 36.5 % (ref 36.0–46.0)
Hemoglobin: 11.1 g/dL — ABNORMAL LOW (ref 12.0–15.0)
Immature Granulocytes: 1 %
Lymphocytes Relative: 24 %
Lymphs Abs: 2.8 10*3/uL (ref 0.7–4.0)
MCH: 23.3 pg — ABNORMAL LOW (ref 26.0–34.0)
MCHC: 30.4 g/dL (ref 30.0–36.0)
MCV: 76.7 fL — ABNORMAL LOW (ref 80.0–100.0)
Monocytes Absolute: 1.1 10*3/uL — ABNORMAL HIGH (ref 0.1–1.0)
Monocytes Relative: 10 %
Neutro Abs: 7.5 10*3/uL (ref 1.7–7.7)
Neutrophils Relative %: 62 %
Platelets: 347 10*3/uL (ref 150–400)
RBC: 4.76 MIL/uL (ref 3.87–5.11)
RDW: 13.8 % (ref 11.5–15.5)
WBC: 11.7 10*3/uL — ABNORMAL HIGH (ref 4.0–10.5)
nRBC: 0 % (ref 0.0–0.2)

## 2022-06-05 LAB — COMPREHENSIVE METABOLIC PANEL
ALT: 17 U/L (ref 0–44)
AST: 17 U/L (ref 15–41)
Albumin: 3.1 g/dL — ABNORMAL LOW (ref 3.5–5.0)
Alkaline Phosphatase: 82 U/L (ref 38–126)
Anion gap: 12 (ref 5–15)
BUN: 12 mg/dL (ref 8–23)
CO2: 23 mmol/L (ref 22–32)
Calcium: 9.2 mg/dL (ref 8.9–10.3)
Chloride: 103 mmol/L (ref 98–111)
Creatinine, Ser: 0.68 mg/dL (ref 0.44–1.00)
GFR, Estimated: >60 mL/min (ref >60)
Glucose, Bld: 124 mg/dL — ABNORMAL HIGH (ref 70–99)
Potassium: 3.4 mmol/L — ABNORMAL LOW (ref 3.5–5.1)
Sodium: 138 mmol/L (ref 135–145)
Total Bilirubin: 0.9 mg/dL (ref 0.3–1.2)
Total Protein: 6.6 g/dL (ref 6.5–8.1)

## 2022-06-05 LAB — RETICULOCYTES
Immature Retic Fract: 18.4 % — ABNORMAL HIGH (ref 2.3–15.9)
RBC.: 4.75 MIL/uL (ref 3.87–5.11)
Retic Count, Absolute: 66.5 10*3/uL (ref 19.0–186.0)
Retic Ct Pct: 1.4 % (ref 0.4–3.1)

## 2022-06-05 LAB — IRON AND TIBC
Iron: 31 ug/dL (ref 28–170)
Saturation Ratios: 8 % — ABNORMAL LOW (ref 10.4–31.8)
TIBC: 386 ug/dL (ref 250–450)
UIBC: 355 ug/dL

## 2022-06-05 LAB — VITAMIN B12: Vitamin B-12: 1467 pg/mL — ABNORMAL HIGH (ref 180–914)

## 2022-06-05 LAB — GLUCOSE, CAPILLARY
Glucose-Capillary: 137 mg/dL — ABNORMAL HIGH (ref 70–99)
Glucose-Capillary: 147 mg/dL — ABNORMAL HIGH (ref 70–99)
Glucose-Capillary: 200 mg/dL — ABNORMAL HIGH (ref 70–99)
Glucose-Capillary: 126 mg/dL — ABNORMAL HIGH (ref 70–99)

## 2022-06-05 LAB — FERRITIN: Ferritin: 8 ng/mL — ABNORMAL LOW (ref 11–307)

## 2022-06-05 LAB — FOLATE: Folate: 27.9 ng/mL (ref >5.9)

## 2022-06-05 LAB — MAGNESIUM: Magnesium: 1.6 mg/dL — ABNORMAL LOW (ref 1.7–2.4)

## 2022-06-05 LAB — PHOSPHORUS: Phosphorus: 3.1 mg/dL (ref 2.5–4.6)

## 2022-06-05 MED ORDER — IPRATROPIUM-ALBUTEROL 0.5-2.5 (3) MG/3ML IN SOLN
3.0000 mL | Freq: Two times a day (BID) | RESPIRATORY_TRACT | Status: DC
  Administered 2022-06-06 – 2022-06-07 (×3): 3 mL via RESPIRATORY_TRACT
  Filled 2022-06-05 (×3): qty 3

## 2022-06-05 MED ORDER — HALOPERIDOL LACTATE 5 MG/ML IJ SOLN
2.0000 mg | Freq: Four times a day (QID) | INTRAMUSCULAR | Status: AC | PRN
  Administered 2022-06-05: 2 mg via INTRAVENOUS
  Filled 2022-06-05: qty 1

## 2022-06-05 MED ORDER — MELATONIN 3 MG PO TABS
3.0000 mg | ORAL_TABLET | Freq: Every day | ORAL | Status: DC
  Administered 2022-06-05 – 2022-06-06 (×2): 3 mg via ORAL
  Filled 2022-06-05 (×2): qty 1

## 2022-06-05 MED ORDER — POTASSIUM CHLORIDE CRYS ER 20 MEQ PO TBCR
40.0000 meq | EXTENDED_RELEASE_TABLET | Freq: Once | ORAL | Status: AC
  Administered 2022-06-05: 40 meq via ORAL
  Filled 2022-06-05: qty 2

## 2022-06-05 MED ORDER — GUAIFENESIN ER 600 MG PO TB12
600.0000 mg | ORAL_TABLET | Freq: Two times a day (BID) | ORAL | Status: DC
  Administered 2022-06-05 – 2022-06-07 (×5): 600 mg via ORAL
  Filled 2022-06-05 (×5): qty 1

## 2022-06-05 MED ORDER — FUROSEMIDE 10 MG/ML IJ SOLN
20.0000 mg | Freq: Once | INTRAMUSCULAR | Status: AC
  Administered 2022-06-05: 20 mg via INTRAVENOUS
  Filled 2022-06-05: qty 2

## 2022-06-05 MED ORDER — ALBUTEROL SULFATE (2.5 MG/3ML) 0.083% IN NEBU: 2.5000 mg | INHALATION_SOLUTION | RESPIRATORY_TRACT | Status: DC | PRN

## 2022-06-05 MED ORDER — QUETIAPINE FUMARATE 25 MG PO TABS
25.0000 mg | ORAL_TABLET | Freq: Every day | ORAL | Status: DC
  Administered 2022-06-05: 25 mg via ORAL
  Filled 2022-06-05: qty 1

## 2022-06-05 MED ORDER — IPRATROPIUM-ALBUTEROL 0.5-2.5 (3) MG/3ML IN SOLN
3.0000 mL | Freq: Four times a day (QID) | RESPIRATORY_TRACT | Status: DC
  Administered 2022-06-05 (×2): 3 mL via RESPIRATORY_TRACT
  Filled 2022-06-05 (×2): qty 3

## 2022-06-05 NOTE — Plan of Care (Signed)
  Problem: Health Behavior/Discharge Planning: Goal: Ability to manage health-related needs will improve Outcome: Not Progressing   Problem: Safety: Goal: Non-violent Restraint(s) 06/05/2022 0626 by Charlynn Grimes, RN Outcome: Completed/Met 06/05/2022 0035 by Charlynn Grimes, RN Outcome: Not Progressing   Problem: Safety: Goal: Non-violent Restraint(s) 06/05/2022 0626 by Charlynn Grimes, RN Outcome: Completed/Met 06/05/2022 0035 by Charlynn Grimes, RN Outcome: Not Progressing   Problem: Health Behavior/Discharge Planning: Goal: Ability to manage health-related needs will improve Outcome: Not Progressing

## 2022-06-05 NOTE — Plan of Care (Signed)
  Problem: Health Behavior/Discharge Planning: Goal: Ability to manage health-related needs will improve Outcome: Not Progressing   

## 2022-06-05 NOTE — Evaluation (Signed)
Clinical/Bedside Swallow Evaluation Patient Details  Name: Allison Hughes MRN: [id number] Date of Birth: 1950-03-08  Today's Date: 06/05/2022 Time: SLP Start Time (ACUTE ONLY): 1445 SLP Stop Time (ACUTE ONLY): 1456 SLP Time Calculation (min) (ACUTE ONLY): 11 min  Past Medical History:  Past Medical History:  Diagnosis Date   Diabetes mellitus 2006   T2DM   Hyperlipidemia    Hypertension    Obesity (BMI 30-39.9)    Past Surgical History:  Past Surgical History:  Procedure Laterality Date   CESAREAN SECTION     LOOP RECORDER INSERTION N/A 09/08/2020   Procedure: LOOP RECORDER INSERTION;  Surgeon: Regan Lemming, MD;  Location: [facility];  Service: Cardiovascular;  Laterality: N/A;   ORIF ANKLE FRACTURE  05/11/2011   Procedure: OPEN REDUCTION INTERNAL FIXATION (ORIF) ANKLE FRACTURE;  Surgeon: Nadara Mustard, MD;  Location: [facility] OR;  Service: Orthopedics;  Laterality: Right;   TUBAL LIGATION     HPI:  Pt is 72 yo female who presents on 06/04/22 with aphasia, word finding difficulties and word salad per significant other.  MRI neg for acute changes.  PMHx significant for DMII, HTN and DLD, CVA R parietal, R corona radiata, and L cerebellar regions    Assessment / Plan / Recommendation  Clinical Impression  Pt presents with no subjective signs of dysphgia and denies any difficulty. Husband and bedside reprots he was present when pt took potassium tablet today and coughed when drinking the water. SLP and pt discussed some basic strategies for taking pills, which is usually easy for her. Would be best if pt sits upright and feeds herself sips of water or drink as she would at home. Seems an isolated incident. No SLP f/u needed will sign off. SLP Visit Diagnosis: Dysphagia, oropharyngeal phase (R13.12)    Aspiration Risk       Diet Recommendation Regular;Thin liquid   Liquid Administration via: Straw;Cup Medication Administration: Whole meds with liquid Supervision: Patient  able to self feed Compensations: Slow rate;Small sips/bites Postural Changes: Seated upright at 90 degrees    Other  Recommendations      Recommendations for follow up therapy are one component of a multi-disciplinary discharge planning process, led by the attending physician.  Recommendations may be updated based on patient status, additional functional criteria and insurance authorization.  Follow up Recommendations No SLP follow up      Assistance Recommended at Discharge    Functional Status Assessment    Frequency and Duration            Prognosis        Swallow Study   General HPI: Pt is 72 yo female who presents on 06/04/22 with aphasia, word finding difficulties and word salad per significant other.  MRI neg for acute changes.  PMHx significant for DMII, HTN and DLD, CVA R parietal, R corona radiata, and L cerebellar regions Type of Study: Bedside Swallow Evaluation Diet Prior to this Study: Regular;Thin liquids (Level 0) Temperature Spikes Noted: No Respiratory Status: Nasal cannula History of Recent Intubation: No Behavior/Cognition: Alert;Cooperative;Pleasant mood Oral Cavity Assessment: Within Functional Limits Oral Care Completed by SLP: No Oral Cavity - Dentition: Adequate natural dentition Vision: Functional for self-feeding Self-Feeding Abilities: Able to feed self Patient Positioning: Upright in bed Baseline Vocal Quality: Normal Volitional Cough: Strong Volitional Swallow: Able to elicit    Oral/Motor/Sensory Function     Ice Chips     Thin Liquid Thin Liquid: Within functional limits Presentation: Straw;Self Fed  Nectar Thick Nectar Thick Liquid: Not tested   Honey Thick Honey Thick Liquid: Not tested   Puree Puree: Within functional limits Presentation: Self Fed   Solid     Solid: Within functional limits Presentation: Self Fed      Allison Hughes 06/05/2022,3:02 PM

## 2022-06-05 NOTE — Evaluation (Signed)
Occupational Therapy Evaluation Patient Details Name: Allison Hughes MRN: [id number] DOB: March 24, 1950 Today's Date: 06/05/2022   History of Present Illness Pt is 72 yo female who presents on 06/04/22 with aphasia, word finding difficulties and word salad per significant other.  MRI neg for acute changes.  PMHx significant for DMII, HTN and DLD, CVA R parietal, R corona radiata, and L cerebellar regions   Clinical Impression   At baseline pt requires assistance for tub/shower transfer and receives assistance for meal prep and home management tasks. Pt is otherwise Independent with ADLs and functional mobility/transfers without an AD. Pt currently presents with a decline in B UE generalized strength worse on Left, cognition, activity tolerance, B UE coordination worse on Left, balance during functional tasks, and safety and independence with ADLs and functional mobility/transfers. Pt currently completes UB ADLs with Set up and cues to Mod assist, LB ADLs with Max assist, and functional transfers with RW with Min assist +2 for safety. Pt will benefit from acute skilled OT services to address deficits listed below and increase safety and independence with ADLs and functional transfers/mobility. Pt expected to make good progress and will benefit from continued skilled OT services in the home post acute discharge.      Recommendations for follow up therapy are one component of a multi-disciplinary discharge planning process, led by the attending physician.  Recommendations may be updated based on patient status, additional functional criteria and insurance authorization.   Assistance Recommended at Discharge Frequent or constant Supervision/Assistance  Patient can return home with the following Two people to help with walking and/or transfers;Two people to help with bathing/dressing/bathroom;Assistance with cooking/housework;Direct supervision/assist for medications management;Direct supervision/assist for  financial management;Assist for transportation;Help with stairs or ramp for entrance    Functional Status Assessment  Patient has had a recent decline in their functional status and demonstrates the ability to make significant improvements in function in a reasonable and predictable amount of time.  Equipment Recommendations  None recommended by OT    Recommendations for Other Services       Precautions / Restrictions Precautions Precautions: Fall Restrictions Weight Bearing Restrictions: No      Mobility Bed Mobility Overal bed mobility: Needs Assistance Bed Mobility: Supine to Sit, Sit to Supine     Supine to sit: Min assist Sit to supine: Mod assist   General bed mobility comments: Pt required mod assist to scoot laterally toward HOB. Pt required cues for intiation, sequecning, and safety.    Transfers Overall transfer level: Needs assistance Equipment used: Rolling walker (2 wheels) Transfers: Sit to/from Stand Sit to Stand: Min assist                  Balance Overall balance assessment: Needs assistance Sitting-balance support: Feet supported, No upper extremity supported Sitting balance-Leahy Scale: Fair Sitting balance - Comments: Pt sits with feet off floor, posterior lean, no LOB in static sitting but cannot accept challenge. Pt able to bring hips to EOB and put feet on floor with max cues and extra time. Postural control: Posterior lean Standing balance support: Bilateral upper extremity supported, Reliant on assistive device for balance, During functional activity Standing balance-Leahy Scale: Poor                             ADL either performed or assessed with clinical judgement   ADL Overall ADL's : Needs assistance/impaired Eating/Feeding: Set up;Cueing for sequencing;Sitting (cues for initiation and to sustain  participation in self feeding)   Grooming: Minimal assistance;Cueing for sequencing;Sitting (cues for initiation and  sustained attention to task)   Upper Body Bathing: Moderate assistance;Cueing for safety;Cueing for sequencing;Sitting (cues for initiaiton and to sustain attention to task)   Lower Body Bathing: Maximal assistance;Cueing for sequencing;Cueing for safety;Cueing for compensatory techniques;Sit to/from stand;+2 for safety/equipment (cues for initiaiton and to sustain attention to task)   Upper Body Dressing : Minimal assistance;Cueing for sequencing (cues for initiaiton)   Lower Body Dressing: Maximal assistance;Cueing for safety;Cueing for sequencing;Cueing for compensatory techniques;Sit to/from stand (cues for initiaiton)   Toilet Transfer: Minimal assistance;Cueing for safety;Cueing for sequencing;Rolling walker (2 wheels);BSC/3in1;+2 for safety/equipment (cues for initiation and sustained attention to task)   Toileting- Clothing Manipulation and Hygiene: Maximal assistance;+2 for safety/equipment;Cueing for safety;Cueing for sequencing;Sit to/from stand (cues for initiation)         General ADL Comments: Did not attempt functional mobility secondary to pt current need for +2 for safety     Vision Baseline Vision/History: 1 Wears glasses (Readers) Ability to See in Adequate Light: 0 Adequate Patient Visual Report: No change from baseline Vision Assessment?: Yes;No apparent visual deficits     Perception     Praxis Praxis Praxis tested?: Deficits Deficits: Initiation;Motor Impersistence;Organization Praxis-Other Comments: Requires extra time for motor planning    Pertinent Vitals/Pain Pain Assessment Pain Assessment: No/denies pain     Hand Dominance Right   Extremity/Trunk Assessment Upper Extremity Assessment Upper Extremity Assessment: Generalized weakness;RUE deficits/detail;LUE deficits/detail (worse on Left) RUE Deficits / Details: generalized weakness RUE Sensation: decreased proprioception RUE Coordination: decreased fine motor LUE Deficits / Details:  generalized weakness LUE Sensation: decreased proprioception LUE Coordination: decreased fine motor;decreased gross motor   Lower Extremity Assessment Lower Extremity Assessment: Defer to PT evaluation   Cervical / Trunk Assessment Cervical / Trunk Assessment: Normal   Communication Communication Communication: Receptive difficulties;Expressive difficulties;HOH   Cognition Arousal/Alertness: Awake/alert Behavior During Therapy: Flat affect Overall Cognitive Status: Impaired/Different from baseline Area of Impairment: Attention, Memory, Following commands, Safety/judgement, Awareness, Problem solving                   Current Attention Level: Focused Memory: Decreased short-term memory Following Commands: Follows one step commands inconsistently, Follows one step commands with increased time Safety/Judgement: Decreased awareness of safety, Decreased awareness of deficits Awareness: Intellectual Problem Solving: Slow processing, Difficulty sequencing, Requires verbal cues, Decreased initiation General Comments: Pt presents with difficulty with word finding, but able to communicate effectively with extra time for processing and word finding.     General Comments  VSS on 3L continuous O2 through nasal cannula. Boyfriend present throughout session.    Exercises     Shoulder Instructions      Home Living Family/patient expects to be discharged to:: Private residence Living Arrangements: Other (Comment) (Pt and boyfriend have separate residences, but boyfriend spends a significant amount of time at pt's house.) Available Help at Discharge: Friend(s);Available PRN/intermittently (boyfriend) Type of Home: House Home Access: Stairs to enter [employer] of Steps: 3-4 Entrance Stairs-Rails: Right;Left;Can reach both Home Layout: One level     Bathroom Shower/Tub: Chief Strategy Officer: Standard Bathroom Accessibility: Yes How Accessible: Accessible  via walker Home Equipment: Rolling Walker (2 wheels);Cane - single point;BSC/3in1;Shower seat          Prior Functioning/Environment Prior Level of Function : Needs assist             Mobility Comments: Pt typically walks with a SPC. Pt  does not drive. ADLs Comments: Boyfriend assists pt with tub/shower transfer, meal prep, and home management tasks. Pt is otherwise Independent with ADLs at baseline.        OT Problem List: Decreased strength;Decreased activity tolerance;Impaired balance (sitting and/or standing);Decreased coordination;Decreased cognition;Decreased safety awareness;Decreased knowledge of use of DME or AE;Decreased knowledge of precautions;Impaired UE functional use      OT Treatment/Interventions: Self-care/ADL training;Therapeutic exercise;DME and/or AE instruction;Therapeutic activities;Cognitive remediation/compensation;Patient/family education;Balance training    OT Goals(Current goals can be found in the care plan section) Acute Rehab OT Goals Patient Stated Goal: to return home OT Goal Formulation: With patient/family Time For Goal Achievement: 06/19/22 Potential to Achieve Goals: Good ADL Goals Pt Will Perform Grooming: with min guard assist;standing Pt Will Perform Upper Body Bathing: sitting;with min guard assist Pt Will Perform Upper Body Dressing: with supervision;sitting Pt Will Transfer to Toilet: with supervision;regular height toilet (with least restrictive AD) Pt Will Perform Toileting - Clothing Manipulation and hygiene: with min guard assist;sit to/from stand  OT Frequency: Min 2X/week    Co-evaluation              AM-PAC OT "6 Clicks" Daily Activity     Outcome Measure Help from another person eating meals?: A Little Help from another person taking care of personal grooming?: A Little Help from another person toileting, which includes using toliet, bedpan, or urinal?: A Lot Help from another person bathing (including washing,  rinsing, drying)?: A Lot Help from another person to put on and taking off regular upper body clothing?: A Lot Help from another person to put on and taking off regular lower body clothing?: A Lot 6 Click Score: 14   End of Session Equipment Utilized During Treatment: Oxygen Nurse Communication: Mobility status;Other (comment) (O2 sat level)  Activity Tolerance: Patient tolerated treatment well Patient left: in bed;with call bell/phone within reach;with bed alarm set;with family/visitor present  OT Visit Diagnosis: Unsteadiness on feet (R26.81);Other abnormalities of gait and mobility (R26.89);Muscle weakness (generalized) (M62.81);Apraxia (R48.2);Ataxia, unspecified (R27.0)                Time: 8756-4332 OT Time Calculation (min): 33 min Charges:  OT General Charges $OT Visit: 1 Visit OT Evaluation $OT Eval Moderate Complexity: 1 Mod OT Treatments $Self Care/Home Management : 8-22 mins  Lynard Postlewait "Orson Eva., OTR/L, MA Acute Rehab [phone]   Lendon Colonel 06/05/2022, 3:27 PM

## 2022-06-05 NOTE — Progress Notes (Addendum)
PROGRESS NOTE        PATIENT DETAILS Name: Allison Hughes Age: 72 y.o. Sex: female Date of Birth: 1950-05-02 Admit Date: 06/03/2022 Admitting Physician Darlin Drop, DO UJW:JXBJYNW, Channing Mutters, MD  Brief Summary: Patient is a 72 y.o.  female with prior history of CVA, recurrent TIA, HTN, DM-2, ongoing tobacco use-who presented with aphasia-Hospital course complicated by development of hypoxemia and confusion/somnolence.  Significant events: 5/20>> admit to [facility] 5/22>> choking/aspiration episode-with potassium pill-still requiring 2-2 L of oxygen.  O2 saturation dips down to the mid-high 80s on room air.  CT with no pneumonia but pulmonary edema.  Significant studies: 5/21>> MRI brain: No acute CVA 5/21>> echo: EF 65-70%, grade 1 diastolic dysfunction 521>> LDL:57 5/21>> A1c: 6.6 5/21>> Spot EEG: No seizures.  Significant microbiology data: None  Procedures: None  Consults: Neurology  Subjective: Lying comfortably in bed-denies any chest pain or shortness of breath.  Awake/alert.  Requesting discharge  Objective: Vitals: Blood pressure (!) 100/50, pulse 92, temperature 98.6 F (37 C), temperature source Oral, resp. rate 18, height 5\' 2"  (1.575 m), weight 82.2 kg, SpO2 93 %.   Exam: Gen Exam:Alert awake-not in any distress HEENT:atraumatic, normocephalic Chest: B/L clear to auscultation anteriorly CVS:S1S2 regular Abdomen:soft non tender, non distended Extremities:no edema Neurology: Non focal Skin: no rash  Pertinent Labs/Radiology:    Latest Ref Rng & Units 06/05/2022    4:14 AM 06/04/2022    9:38 AM 06/04/2022    1:47 AM  CBC  WBC 4.0 - 10.5 K/uL 11.7   13.5   Hemoglobin 12.0 - 15.0 g/dL 29.5  62.1  30.8   Hematocrit 36.0 - 46.0 % 36.5  34.0  38.1   Platelets 150 - 400 K/uL 347   370     Lab Results  Component Value Date   NA 138 06/05/2022   K 3.4 (L) 06/05/2022   CL 103 06/05/2022   CO2 23 06/05/2022       Assessment/Plan: TIA Speech back to normal Workup as above Neurology recommending aspirin/Plavix x 3 weeks followed by Plavix alone.  Acute hypoxic respiratory failure Appears to have mild hypoxemia-O2 saturation persistently in the mid to high 80s on room air-requiring around 2-3 L of oxygen (weaned to room air multiple times but then quickly desaturates down to the mid 80s per RN staff) Suspect etiology to be HFpEF exacerbation with some amount of undiagnosed COPD in the background (history of tobacco use).  Could have some aspiration pneumonitis being a role in hypoxia as well-as she choked on a potassium pill this morning-however CXR without any overt infiltrates. Plan is to mobilize/pulmonary toileting with incentive spirometry Diuresis-IV Lasix 40 mg x 1 Scheduled bronchodilators Reassess 5/23  Hospital delirium ?  Some amount of chronic cognitive dysfunction-in the setting of multiple CVAs Received Haldol last night for confusion/delirium Neuroimaging/EEG/TSH/ammonia level/vitamin B12 all stable.  Low-dose scheduled Seroquel nightly  HLD Statin  HTN BP stable Lisinopril  DM-2 CBG stable on SSI Resume metformin on discharge  Recent Labs    06/04/22 2024 06/05/22 0820 06/05/22 1230  GLUCAP 151* 137* 147*   Tobacco abuse Counseled  Obesity: Estimated body mass index is 33.15 kg/m as calculated from the following:   Height as of this encounter: 5\' 2"  (1.575 m).   Weight as of this encounter: 82.2 kg.   Code status:   Code Status:  Full Code   DVT Prophylaxis: enoxaparin (LOVENOX) injection 40 mg Start: 06/04/22 1000   Family Communication: None at bedside   Disposition Plan: Status is: Observatio The patient will require care spanning > 2 midnights and should be moved to inpatient because: Severity of illness   Planned Discharge Destination:Home   Diet: Diet Order             Diet heart healthy/carb modified Fluid consistency: Thin  Diet  effective now                     Antimicrobial agents: Anti-infectives (From admission, onward)    None        MEDICATIONS: Scheduled Meds:  aspirin EC  81 mg Oral Daily   atorvastatin  40 mg Oral Daily   cholecalciferol  1,000 Units Oral Daily   cyanocobalamin  1,000 mcg Oral Daily   enoxaparin (LOVENOX) injection  40 mg Subcutaneous Q24H   furosemide  20 mg Intravenous Once   guaiFENesin  600 mg Oral BID   insulin aspart  0-5 Units Subcutaneous QHS   insulin aspart  0-9 Units Subcutaneous TID WC   ipratropium-albuterol  3 mL Nebulization Q6H   lisinopril  20 mg Oral Daily   Continuous Infusions: PRN Meds:.acetaminophen, albuterol, mouth rinse, polyethylene glycol, prochlorperazine   I have personally reviewed following labs and imaging studies  LABORATORY DATA: CBC: Recent Labs  Lab 06/03/22 1950 06/03/22 1956 06/04/22 0147 06/04/22 0938 06/05/22 0414  WBC 12.2*  --  13.5*  --  11.7*  NEUTROABS 7.5  --   --   --  7.5  HGB 11.4* 12.2 11.5* 11.6* 11.1*  HCT 37.8 36.0 38.1 34.0* 36.5  MCV 77.6*  --  77.1*  --  76.7*  PLT 376  --  370  --  347    Basic Metabolic Panel: Recent Labs  Lab 06/03/22 1950 06/03/22 1956 06/04/22 0147 06/04/22 0938 06/05/22 0414  NA 139 140 138 136 138  K 3.9 4.0 4.0 3.9 3.4*  CL 106 105 104  --  103  CO2 23  --  23  --  23  GLUCOSE 102* 101* 97  --  124*  BUN 12 14 10   --  12  CREATININE 0.71 0.50 0.74  --  0.68  CALCIUM 9.0  --  9.8  --  9.2  MG  --   --  1.7  --  1.6*  PHOS  --   --   --   --  3.1    GFR: Estimated Creatinine Clearance: 65 mL/min (by C-G formula based on SCr of 0.68 mg/dL).  Liver Function Tests: Recent Labs  Lab 06/03/22 1950 06/04/22 0147 06/05/22 0414  AST 19 21 17   ALT 17 17 17   ALKPHOS 85 94 82  BILITOT 0.5 <0.1* 0.9  PROT 6.8 7.2 6.6  ALBUMIN 3.2* 3.4* 3.1*   No results for input(s): "LIPASE", "AMYLASE" in the last 168 hours. Recent Labs  Lab 06/04/22 1356  AMMONIA 13     Coagulation Profile: Recent Labs  Lab 06/03/22 1950  INR 1.0    Cardiac Enzymes: No results for input(s): "CKTOTAL", "CKMB", "CKMBINDEX", "TROPONINI" in the last 168 hours.  BNP (last 3 results) No results for input(s): "PROBNP" in the last 8760 hours.  Lipid Profile: Recent Labs    06/04/22 0147  CHOL 132  HDL 42  LDLCALC 57  TRIG 165*  CHOLHDL 3.1    Thyroid Function Tests: Recent Labs  06/04/22 0147  TSH 0.711    Anemia Panel: Recent Labs    06/04/22 1356 06/05/22 0414  VITAMINB12 1,920* 1,467*  FOLATE  --  27.9  FERRITIN  --  8*  TIBC  --  386  IRON  --  31  RETICCTPCT  --  1.4    Urine analysis:    Component Value Date/Time   COLORURINE STRAW (A) 06/04/2022 0954   APPEARANCEUR CLEAR 06/04/2022 0954   LABSPEC 1.005 06/04/2022 0954   PHURINE 6.0 06/04/2022 0954   GLUCOSEU NEGATIVE 06/04/2022 0954   HGBUR NEGATIVE 06/04/2022 0954   BILIRUBINUR NEGATIVE 06/04/2022 0954   KETONESUR NEGATIVE 06/04/2022 0954   PROTEINUR NEGATIVE 06/04/2022 0954   NITRITE NEGATIVE 06/04/2022 0954   LEUKOCYTESUR LARGE (A) 06/04/2022 0954    Sepsis Labs: Lactic Acid, Venous    Component Value Date/Time   LATICACIDVEN 1.2 09/06/2020 1307    MICROBIOLOGY: No results found for this or any previous visit (from the past 240 hour(s)).  RADIOLOGY STUDIES/RESULTS: DG Chest Port 1 View  Result Date: 06/05/2022 CLINICAL DATA:  Shortness of breath. EXAM: PORTABLE CHEST 1 VIEW COMPARISON:  06/04/2022. FINDINGS: Trachea is midline. Heart size stable. Perihilar and basilar predominant mixed interstitial and airspace opacification, similar to minimally progressive from yesterday's exam. Loop recorder projects over the left heart. IMPRESSION: Pulmonary edema with bibasilar atelectasis. Electronically Signed   By: Leanna Battles M.D.   On: 06/05/2022 12:57   EEG adult  Result Date: 06/04/2022 Charlsie Quest, MD     06/04/2022  3:06 PM Patient Name: Ramatoulaye Puglia MRN:  [id number] Epilepsy Attending: Charlsie Quest Referring Physician/Provider: Lynnae January, NP Date: 06/04/2022 Duration: 22.22 mins Patient history:  72 y.o. female with history of DM2, hypertension, hyperlipidemia, obesity, current smoker, multiple TIAs presented as code stroke 5/20 due to sudden onset of word finding difficulty/"gibberish speech ". EEG to evaluate for seizure. Level of alertness: Awake, asleep AEDs during EEG study: None Technical aspects: This EEG study was done with scalp electrodes positioned according to the 10-20 International system of electrode placement. Electrical activity was reviewed with band pass filter of 1-70Hz , sensitivity of 7 uV/mm, display speed of 32mm/sec with a 60Hz  notched filter applied as appropriate. EEG data were recorded continuously and digitally stored.  Video monitoring was available and reviewed as appropriate. Description: The posterior dominant rhythm consists of 9-10 Hz activity of moderate voltage (25-35 uV) seen predominantly in posterior head regions, symmetric and reactive to eye opening and eye closing. Sleep was characterized by vertex waves, sleep spindles (12 to 14 Hz), maximal frontocentral region. EEG showed intermittent 3 to 6 Hz theta-delta slowing in left temporal region. Hyperventilation and photic stimulation were not performed.   ABNORMALITY - Intermittent slow, left temporal region IMPRESSION: This study is suggestive of cortical dysfunction arising from left temporal region nonspecific etiology. No seizures or epileptiform discharges were seen throughout the recording. Charlsie Quest   MR BRAIN WO CONTRAST  Result Date: 06/04/2022 CLINICAL DATA:  Neuro deficit, acute, stroke suspected. EXAM: MRI HEAD WITHOUT CONTRAST TECHNIQUE: Multiplanar, multiecho pulse sequences of the brain and surrounding structures were obtained without intravenous contrast. COMPARISON:  Head CT 06/03/2022.  MRI brain 09/07/2020. FINDINGS: Brain: No acute infarct  or hemorrhage. Unchanged severe chronic small-vessel disease with old infarcts in the right parietal lobe, right corona radiata and left cerebellar hemisphere. No mass or midline shift. No hydrocephalus or extra-axial collection. No abnormal susceptibility. Vascular: Normal flow voids. Skull and upper cervical spine: Normal  marrow signal. Sinuses/Orbits: Unremarkable. Other: None. IMPRESSION: 1. No acute intracranial process. 2. Unchanged severe chronic small-vessel disease with old infarcts in the right parietal lobe, right corona radiata and left cerebellar hemisphere. Electronically Signed   By: Orvan Falconer M.D.   On: 06/04/2022 11:45   DG CHEST PORT 1 VIEW  Result Date: 06/04/2022 CLINICAL DATA:  [id number] Acute respiratory failure with hypoxia (HCC) [id number] EXAM: PORTABLE CHEST 1 VIEW COMPARISON:  CXR 10/05/20 FINDINGS: Loop recorder device projects over the left hemithorax. No pleural effusion. No pneumothorax. There are hazy bibasilar airspace opacities could represent atelectasis or infection. Unchanged cardiac and mediastinal contours. No radiographically apparent displaced rib fractures. Visualized upper abdomen is unremarkable. IMPRESSION: Hazy bibasilar airspace opacities could represent atelectasis or infection. Electronically Signed   By: Lorenza Cambridge M.D.   On: 06/04/2022 10:42   ECHOCARDIOGRAM COMPLETE  Result Date: 06/04/2022    ECHOCARDIOGRAM REPORT   Patient Name:   MAYLEI ZEBROWSKI Date of Exam: 06/04/2022 Medical Rec #:  [id number]      Height:       62.0 in Accession #:    [id number]     Weight:       181.2 lb Date of Birth:  02/18/1950      BSA:          1.833 m Patient Age:    71 years       BP:           138/62 mmHg Patient Gender: F              HR:           92 bpm. Exam Location:  Inpatient Procedure: 2D Echo, Color Doppler and Cardiac Doppler Indications:    R06.9 DOE  History:        Patient has prior history of Echocardiogram examinations, most                 recent 09/07/2020.  Arrythmias:Atrial Fibrillation; Risk                 Factors:Hypertension.  Sonographer:    Irving Burton Senior RDCS Referring Phys: [id number] CAROLE N HALL IMPRESSIONS  1. Left ventricular ejection fraction, by estimation, is 65 to 70%. The left ventricle has hyperdynamic function. The left ventricle has no regional wall motion abnormalities. Left ventricular diastolic parameters are consistent with Grade I diastolic dysfunction (impaired relaxation).  2. Right ventricular systolic function is normal. The right ventricular size is normal.  3. The mitral valve is normal in structure. No evidence of mitral valve regurgitation. No evidence of mitral stenosis.  4. The aortic valve is normal in structure. Aortic valve regurgitation is not visualized. No aortic stenosis is present.  5. The inferior vena cava is normal in size with greater than 50% respiratory variability, suggesting right atrial pressure of 3 mmHg. Comparison(s): Prior images reviewed side by side. Diastolic LV parameters are worse, but filling pressure at resr remains normal. FINDINGS  Left Ventricle: Left ventricular ejection fraction, by estimation, is 65 to 70%. The left ventricle has hyperdynamic function. The left ventricle has no regional wall motion abnormalities. The left ventricular internal cavity size was normal in size. There is no left ventricular hypertrophy. Left ventricular diastolic parameters are consistent with Grade I diastolic dysfunction (impaired relaxation). Normal left ventricular filling pressure. Right Ventricle: The right ventricular size is normal. No increase in right ventricular wall thickness. Right ventricular systolic function is normal. Left Atrium: Left atrial size was normal in  size. Right Atrium: Right atrial size was normal in size. Pericardium: There is no evidence of pericardial effusion. Mitral Valve: The mitral valve is normal in structure. No evidence of mitral valve regurgitation. No evidence of mitral valve stenosis.  Tricuspid Valve: The tricuspid valve is normal in structure. Tricuspid valve regurgitation is not demonstrated. No evidence of tricuspid stenosis. Aortic Valve: The aortic valve is normal in structure. Aortic valve regurgitation is not visualized. No aortic stenosis is present. Aortic valve mean gradient measures 6.0 mmHg. Aortic valve peak gradient measures 10.1 mmHg. Aortic valve area, by VTI measures 1.88 cm. Pulmonic Valve: The pulmonic valve was normal in structure. Pulmonic valve regurgitation is not visualized. No evidence of pulmonic stenosis. Aorta: The aortic root is normal in size and structure. Venous: The inferior vena cava is normal in size with greater than 50% respiratory variability, suggesting right atrial pressure of 3 mmHg. IAS/Shunts: No atrial level shunt detected by color flow Doppler.  LEFT VENTRICLE PLAX 2D LVIDd:         3.70 cm   Diastology LVIDs:         2.90 cm   LV e' medial:    6.42 cm/s LV PW:         1.10 cm   LV E/e' medial:  10.2 LV IVS:        0.95 cm   LV e' lateral:   7.07 cm/s LVOT diam:     1.80 cm   LV E/e' lateral: 9.3 LV SV:         63 LV SV Index:   35 LVOT Area:     2.54 cm  RIGHT VENTRICLE RV S prime:     7.62 cm/s TAPSE (M-mode): 1.3 cm LEFT ATRIUM           Index        RIGHT ATRIUM          Index LA Vol (A2C): 18.6 ml 10.15 ml/m  RA Area:     9.01 cm LA Vol (A4C): 25.9 ml 14.13 ml/m  RA Volume:   16.10 ml 8.78 ml/m  AORTIC VALVE AV Area (Vmax):    1.84 cm AV Area (Vmean):   1.71 cm AV Area (VTI):     1.88 cm AV Vmax:           159.00 cm/s AV Vmean:          121.000 cm/s AV VTI:            0.337 m AV Peak Grad:      10.1 mmHg AV Mean Grad:      6.0 mmHg LVOT Vmax:         115.00 cm/s LVOT Vmean:        81.400 cm/s LVOT VTI:          0.249 m LVOT/AV VTI ratio: 0.74  AORTA Ao Root diam: 2.90 cm Ao Asc diam:  3.00 cm MITRAL VALVE MV Area (PHT): 2.55 cm    SHUNTS MV Decel Time: 298 msec    Systemic VTI:  0.25 m MV E velocity: 65.60 cm/s  Systemic Diam: 1.80 cm MV  A velocity: 92.00 cm/s MV E/A ratio:  0.71 Mihai Croitoru MD Electronically signed by Thurmon Fair MD Signature Date/Time: 06/04/2022/9:18:44 AM    Final    CT HEAD CODE STROKE WO CONTRAST  Result Date: 06/03/2022 CLINICAL DATA:  Code stroke. Neuro deficit, acute, stroke suspected. Aphasia. EXAM: CT HEAD WITHOUT CONTRAST TECHNIQUE: Contiguous axial images were  obtained from the base of the skull through the vertex without intravenous contrast. RADIATION DOSE REDUCTION: This exam was performed according to the departmental dose-optimization program which includes automated exposure control, adjustment of the mA and/or kV according to patient size and/or use of iterative reconstruction technique. COMPARISON:  Head CT 09/06/2020. FINDINGS: Brain: No acute hemorrhage. 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 No new loss of gray-white differentiation. No hydrocephalus or extra-axial collection. No mass effect or midline shift. Vascular: No hyperdense vessel or unexpected calcification. Skull: No calvarial fracture or suspicious bone lesion. Skull base is unremarkable. Sinuses/Orbits: Unremarkable. Other: None. ASPECTS [facility] Stroke Program Early CT Score) - Ganglionic level infarction (caudate, lentiform nuclei, internal capsule, insula, M1-M3 cortex): 7 - Supraganglionic infarction (M4-M6 cortex): 3 Total score (0-10 with 10 being normal): 10 IMPRESSION: 1. No acute hemorrhage or evidence of evolving large vessel territory infarct. ASPECT score is 10. 2. 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 Code stroke imaging results were communicated on 06/03/2022 at 8:04 pm to provider Dr. Derry Lory via secure text paging. Electronically Signed   By: Orvan Falconer M.D.   On: 06/03/2022 20:05     LOS: 0 days   Jeoffrey Massed, MD  [facility]    To contact the attending provider between 7A-7P or the covering provider during after hours 7P-7A, please log into the web site [email or web address] and access using universal [place] password for that web site. If you do not have the password, please call the hospital operator.  06/05/2022, 1:09 PM

## 2022-06-06 ENCOUNTER — Other Ambulatory Visit (HOSPITAL_COMMUNITY): Payer: Self-pay

## 2022-06-06 ENCOUNTER — Inpatient Hospital Stay (HOSPITAL_COMMUNITY): Payer: 59

## 2022-06-06 DIAGNOSIS — R4701 Aphasia: Secondary | ICD-10-CM | POA: Diagnosis not present

## 2022-06-06 DIAGNOSIS — G459 Transient cerebral ischemic attack, unspecified: Secondary | ICD-10-CM

## 2022-06-06 DIAGNOSIS — J9601 Acute respiratory failure with hypoxia: Secondary | ICD-10-CM | POA: Diagnosis not present

## 2022-06-06 DIAGNOSIS — I10 Essential (primary) hypertension: Secondary | ICD-10-CM | POA: Diagnosis not present

## 2022-06-06 LAB — GLUCOSE, CAPILLARY
Glucose-Capillary: 142 mg/dL — ABNORMAL HIGH (ref 70–99)
Glucose-Capillary: 125 mg/dL — ABNORMAL HIGH (ref 70–99)
Glucose-Capillary: 113 mg/dL — ABNORMAL HIGH (ref 70–99)
Glucose-Capillary: 174 mg/dL — ABNORMAL HIGH (ref 70–99)

## 2022-06-06 MED ORDER — ASPIRIN 81 MG PO TBEC: 81.0000 mg | DELAYED_RELEASE_TABLET | Freq: Every day | ORAL | 0 refills | Status: AC

## 2022-06-06 MED ORDER — BUDESONIDE 0.25 MG/2ML IN SUSP
0.2500 mg | Freq: Two times a day (BID) | RESPIRATORY_TRACT | Status: DC
  Administered 2022-06-06 – 2022-06-07 (×2): 0.25 mg via RESPIRATORY_TRACT
  Filled 2022-06-06 (×2): qty 2

## 2022-06-06 MED ORDER — CLOPIDOGREL BISULFATE 75 MG PO TABS
75.0000 mg | ORAL_TABLET | Freq: Every day | ORAL | Status: DC
  Administered 2022-06-06 – 2022-06-07 (×2): 75 mg via ORAL
  Filled 2022-06-06 (×2): qty 1

## 2022-06-06 MED ORDER — ARFORMOTEROL TARTRATE 15 MCG/2ML IN NEBU
15.0000 ug | INHALATION_SOLUTION | Freq: Two times a day (BID) | RESPIRATORY_TRACT | Status: DC
  Administered 2022-06-06 – 2022-06-07 (×2): 15 ug via RESPIRATORY_TRACT
  Filled 2022-06-06 (×2): qty 2

## 2022-06-06 MED ORDER — FLUTICASONE-SALMETEROL 250-50 MCG/ACT IN AEPB
1.0000 | INHALATION_SPRAY | Freq: Two times a day (BID) | RESPIRATORY_TRACT | 11 refills | Status: DC
  Filled 2022-06-06: qty 60, 30d supply, fill #0

## 2022-06-06 MED ORDER — REVEFENACIN 175 MCG/3ML IN SOLN
175.0000 ug | Freq: Every day | RESPIRATORY_TRACT | Status: DC
  Administered 2022-06-07: 175 ug via RESPIRATORY_TRACT
  Filled 2022-06-06: qty 3

## 2022-06-06 MED ORDER — INCRUSE ELLIPTA 62.5 MCG/ACT IN AEPB
1.0000 | INHALATION_SPRAY | Freq: Every day | RESPIRATORY_TRACT | 11 refills | Status: DC
  Filled 2022-06-06: qty 30, 30d supply, fill #0

## 2022-06-06 MED ORDER — TRESIBA FLEXTOUCH 100 UNIT/ML ~~LOC~~ SOPN: 12.0000 [IU] | PEN_INJECTOR | Freq: Every day | SUBCUTANEOUS | Status: DC

## 2022-06-06 MED ORDER — QUETIAPINE FUMARATE 50 MG PO TABS
50.0000 mg | ORAL_TABLET | Freq: Every day | ORAL | Status: DC
  Administered 2022-06-06: 50 mg via ORAL
  Filled 2022-06-06: qty 1

## 2022-06-06 MED ORDER — CLOPIDOGREL BISULFATE 75 MG PO TABS
75.0000 mg | ORAL_TABLET | Freq: Every day | ORAL | 11 refills | Status: DC
  Filled 2022-06-06: qty 30, 30d supply, fill #0

## 2022-06-06 MED ORDER — ALBUTEROL SULFATE HFA 108 (90 BASE) MCG/ACT IN AERS
2.0000 | INHALATION_SPRAY | Freq: Four times a day (QID) | RESPIRATORY_TRACT | 0 refills | Status: AC | PRN
  Filled 2022-06-06: qty 6.7, 25d supply, fill #0

## 2022-06-06 NOTE — Progress Notes (Signed)
Physical Therapy Treatment Patient Details Name: Allison Hughes MRN: [id number] DOB: Dec 16, 1950 Today's Date: 06/06/2022   History of Present Illness Pt is 72 yo female who presents on 06/04/22 with aphasia, word finding difficulties and word salad per significant other.  MRI neg for acute changes.  PMHx significant for DMII, HTN and DLD, CVA R parietal, R corona radiata, and L cerebellar regions    PT Comments    Patient resting in bed at start of session and significant other at bedside; both reporting plan to dc home. Pt continues to be limited by poor cognition with deficits in: attention, awareness, processing, and problem solving requiring Mod-Max cues for safety and sequencing tasks throughout. Pt able to move supine>sit with Min assist and significant extra time. Slightly impulsive attempting to initiate stand multiple occasions prior to therapist cuing or readiness; but able to follow cues with repetition. Pt required Mod assist to perform sit<>stand to RW with cues for safe walker management. Pt frequently returned to sitting after stand for seated rest and secondary to poor attention to task. Gait attempted along EOB with forward and lateral steps and Mod assist to guide walker and stabilize. Discussed level of assist required for safe mobility with pt/significant other and concerns regarding stairs to enter home and current need for constant 24/7 assist. Pt/significant other verbalized understanding that pt would need assist with all transfers and gait and use of WC for longer distance ambulation. Discharge recommendations updated for intense rehab follow up >3hours per day as pt is motivated to regain independence and reduce risk of falls/injury. Will continue to progress pt as able in acute setting.    Recommendations for follow up therapy are one component of a multi-disciplinary discharge planning process, led by the attending physician.  Recommendations may be updated based on patient  status, additional functional criteria and insurance authorization.  Follow Up Recommendations       Assistance Recommended at Discharge Frequent or constant Supervision/Assistance  Patient can return home with the following A lot of help with walking and/or transfers;A lot of help with bathing/dressing/bathroom;Assistance with cooking/housework;Direct supervision/assist for medications management;Assist for transportation;Help with stairs or ramp for entrance   Equipment Recommendations  Wheelchair (measurements PT);Wheelchair cushion (measurements PT);BSC/3in1;Rolling walker (2 wheels)    Recommendations for Other Services Rehab consult     Precautions / Restrictions Precautions Precautions: Fall Restrictions Weight Bearing Restrictions: No     Mobility  Bed Mobility Overal bed mobility: Needs Assistance Bed Mobility: Supine to Sit, Sit to Supine     Supine to sit: Min assist Sit to supine: Mod assist   General bed mobility comments: patient requires min assist with cues to sequence sitting up to long sit then pretzle sit, cues for bringing LE's off EOB and to pivot, min assist to fully turn/pivot hips to sit EOB. Mod Assist to control lowering trunk and bring LE's onto bed. Extra time for processing all cues for safety with mobility.    Transfers Overall transfer level: Needs assistance Equipment used: Rolling walker (2 wheels) Transfers: Sit to/from Stand Sit to Stand: Mod assist           General transfer comment: Mod Assist for sit<>stand from EOB with RW for support. Mod-Max VC's for safety and management of RW. Pt impulsively sitting back to EOB  due to weakness and decreased attention to task of standing. [name] significant other instructed on use of gait belt for sit<>stand transfers and Mod assist from therapist to steady pt's balance despite assist  from significant other. pt completed 5x sit<>stand total. Cues needed at times to deter stand as pt attempting to  initiate rise without therapist ready.    Ambulation/Gait Ambulation/Gait assistance: Mod assist Gait Distance (Feet): 4 Feet Assistive device: Rolling walker (2 wheels), 2 person hand held assist Gait Pattern/deviations: Decreased stride length, Trunk flexed, Narrow base of support, Ataxic, Knee flexed in stance - left, Knee flexed in stance - right Gait velocity: decreased     General Gait Details: Pt provided RW for small forward and lateral steps at EOB. pt required Mod assist to prevent LOB with bil LE's buckling Lt>Rt. Pt also with difficulty coordinating step placement to move wtih RW and mod assist needed to stabilize balance.   Stairs             Wheelchair Mobility    Modified Rankin (Stroke Patients Only)       Balance Overall balance assessment: Needs assistance Sitting-balance support: Feet supported, No upper extremity supported Sitting balance-Leahy Scale: Fair Sitting balance - Comments: Pt sits with feet off floor, posterior lean, no LOB in static sitting but cannot accept challenge. Pt able to bring hips to EOB and put feet on floor with max cues and extra time. Postural control: Posterior lean Standing balance support: Bilateral upper extremity supported, Reliant on assistive device for balance, During functional activity Standing balance-Leahy Scale: Poor                              Cognition Arousal/Alertness: Awake/alert Behavior During Therapy: Flat affect, Impulsive Overall Cognitive Status: Impaired/Different from baseline Area of Impairment: Attention, Memory, Following commands, Safety/judgement, Awareness, Problem solving                   Current Attention Level: Focused Memory: Decreased short-term memory, Decreased recall of precautions Following Commands: Follows one step commands inconsistently, Follows one step commands with increased time Safety/Judgement: Decreased awareness of safety, Decreased awareness of  deficits Awareness: Intellectual Problem Solving: Slow processing, Difficulty sequencing, Requires verbal cues, Decreased initiation General Comments: pt able to answer simple questions and follow simple cues with repetition and increased time. Pt        Exercises      General Comments        Pertinent Vitals/Pain Pain Assessment Pain Assessment: No/denies pain    Home Living                          Prior Function            PT Goals (current goals can now be found in the care plan section) Acute Rehab PT Goals Patient Stated Goal: return home to her dog PT Goal Formulation: With patient/family Time For Goal Achievement: 06/18/22 Potential to Achieve Goals: Good Progress towards PT goals: Progressing toward goals    Frequency    Min 4X/week      PT Plan Discharge plan needs to be updated;Frequency needs to be updated    Co-evaluation              AM-PAC PT "6 Clicks" Mobility   Outcome Measure  Help needed turning from your back to your side while in a flat bed without using bedrails?: A Little Help needed moving from lying on your back to sitting on the side of a flat bed without using bedrails?: A Little Help needed moving to and from a bed to a  chair (including a wheelchair)?: A Lot Help needed standing up from a chair using your arms (e.g., wheelchair or bedside chair)?: A Lot Help needed to walk in hospital room?: Total Help needed climbing 3-5 steps with a railing? : Total 6 Click Score: 12    End of Session Equipment Utilized During Treatment: Gait belt;Oxygen Activity Tolerance: Patient tolerated treatment well Patient left: in bed;with call bell/phone within reach;with bed alarm set;with family/visitor present;with nursing/sitter in room Nurse Communication: Mobility status PT Visit Diagnosis: Unsteadiness on feet (R26.81);Muscle weakness (generalized) (M62.81);Difficulty in walking, not elsewhere classified (R26.2);Pain      Time: 1610-9604 PT Time Calculation (min) (ACUTE ONLY): 23 min  Charges:  $Therapeutic Activity: 23-37 mins                     Wynn Maudlin, DPT Acute Rehabilitation Services Office [phone]  06/06/22 3:45 PM

## 2022-06-06 NOTE — Plan of Care (Signed)
  Problem: Education: Goal: Ability to describe self-care measures that may prevent or decrease complications (Diabetes Survival Skills Education) will improve Outcome: Adequate for Discharge Goal: Individualized Educational Video(s) Outcome: Adequate for Discharge   Problem: Coping: Goal: Ability to adjust to condition or change in health will improve Outcome: Adequate for Discharge   Problem: Fluid Volume: Goal: Ability to maintain a balanced intake and output will improve Outcome: Adequate for Discharge   Problem: Health Behavior/Discharge Planning: Goal: Ability to identify and utilize available resources and services will improve Outcome: Adequate for Discharge Goal: Ability to manage health-related needs will improve Outcome: Adequate for Discharge   Problem: Metabolic: Goal: Ability to maintain appropriate glucose levels will improve Outcome: Adequate for Discharge   Problem: Nutritional: Goal: Maintenance of adequate nutrition will improve Outcome: Adequate for Discharge Goal: Progress toward achieving an optimal weight will improve Outcome: Adequate for Discharge   Problem: Skin Integrity: Goal: Risk for impaired skin integrity will decrease Outcome: Adequate for Discharge   Problem: Tissue Perfusion: Goal: Adequacy of tissue perfusion will improve Outcome: Adequate for Discharge   Problem: Education: Goal: Knowledge of General Education information will improve Description: Including pain rating scale, medication(s)/side effects and non-pharmacologic comfort measures Outcome: Adequate for Discharge   Problem: Health Behavior/Discharge Planning: Goal: Ability to manage health-related needs will improve Outcome: Adequate for Discharge   Problem: Clinical Measurements: Goal: Ability to maintain clinical measurements within normal limits will improve Outcome: Adequate for Discharge Goal: Will remain free from infection Outcome: Adequate for Discharge Goal:  Diagnostic test results will improve Outcome: Adequate for Discharge Goal: Respiratory complications will improve Outcome: Adequate for Discharge Goal: Cardiovascular complication will be avoided Outcome: Adequate for Discharge

## 2022-06-06 NOTE — Discharge Summary (Addendum)
PATIENT DETAILS Name: Allison Hughes Age: 72 y.o. Sex: female Date of Birth: Nov 09, 1950 MRN: [id number]. Admitting Physician: Darlin Drop, DO YQM:VHQIONG, Channing Mutters, MD  Admit Date: 06/03/2022 Discharge date: 06/07/2022  Recommendations for Outpatient Follow-up:  Follow up with PCP in 1-2 weeks Please obtain CMP/CBC in one week Being discharged home-with home oxygen Please ensure outpatient follow-up with pulmonology-May require formal PFTs.  Admitted From:  Home  Disposition: Home health   Discharge Condition: good  CODE STATUS:   Code Status: Full Code   Diet recommendation:  Diet Order             Diet - low sodium heart healthy           Diet heart healthy/carb modified Fluid consistency: Thin  Diet effective now                    Brief Summary: Patient is a 72 y.o.  female with prior history of CVA, recurrent TIA, HTN, DM-2, ongoing tobacco use-who presented with aphasia-Hospital course complicated by development of hypoxemia and confusion/somnolence.   Significant events: 5/20>> admit to [facility] 5/22>> choking/aspiration episode-with potassium pill-still requiring 2-2 L of oxygen.  O2 saturation dips down to the mid-high 80s on room air.  CT with no pneumonia but pulmonary edema.   Significant studies: 5/21>> MRI brain: No acute CVA 5/21>> echo: EF 65-70%, grade 1 diastolic dysfunction 521>> LDL:57 5/21>> A1c: 6.6 5/21>> Spot EEG: No seizures. 5/23>> carotid Doppler: No significant stenosis. 5/24>> bilateral lower extremity Doppler: No DVT   Significant microbiology data: None   Procedures: None   Consults: Neurology  Brief Hospital Course: TIA Speech back to normal Workup as above Neurology recommending aspirin/Plavix x 3 weeks followed by Plavix alone.   Acute hypoxic respiratory failure Appears to have mild hypoxemia-O2 saturation persistently in the mid to high 80s on room air-requiring around 2-3 L of oxygen.  Suspect this is a chronic  issue that has become evident because she is hospitalized.  Etiology is multifactorial-possibly undiagnosed COPD given long history of smoking-with some mild HFpEF exacerbation. Managed with Lasix/bronchodilator regimen-continues to require on 2-3 L of oxygen. Suspect she will require home O2 and formal PFTs/evaluation by PCCM in the outpatient setting-I will place on inhaler regimen on discharge.  This can be optimized by PCP/pulmonology in the outpatient setting. Note-both patient-significant other very anxious-and requesting discharge today (see my prior note from today).   Discussed with case manager-Home O2 will be arranged-and patient will be discharged home at her request.  I will send epic message to pulmonology office for follow-up appointment.   Hospital delirium ?  Some amount of chronic cognitive dysfunction-in the setting of multiple CVAs Neuroimaging/EEG/TSH/ammonia level/vitamin B12 all stable.  Managed with Seroquel/as needed Haldol-however upon further evaluation on 5/25.  That the patient takes Xanax before surgery sleep-even though it is supposed to be as needed.  She was given a dose of Xanax last night-after which she had an uneventful night-she is completely awake and alert this morning.  PCP to consider formal dementia evaluation-once she is back to her baseline-in the next several weeks. Please note-spouse/significant other will provide 24/7 care for the next several weeks.  ?  Aspiration pneumonia CXR on 5/24 shows some more left sided infiltrates (personally reviewed) Patient is awake/alert-afebrile-on minimal amount of oxygen Will plan on Augmentin x 5 days Suspect could have aspirated when she was encephalopathic.   HLD Statin   HTN BP stable Lisinopril   DM-2  CBG stable on SSI Resume metformin on discharge Decrease home Tresiba regimen to 12 units  Tobacco abuse Counseled   Obesity: Estimated body mass index is 33.15 kg/m as calculated from the  following:   Height as of this encounter: 5\' 2"  (1.575 m).   Weight as of this encounter: 82.2 kg.    Discharge Diagnoses:  Principal Problem:   Aphasia   Discharge Instructions:  Activity:  As tolerated with Full fall precautions use walker/cane & assistance as needed   Discharge Instructions     Ambulatory referral to Neurology   Complete by: As directed    An appointment is requested in approximately: 4 weeks   Ambulatory referral to Pulmonology   Complete by: As directed    Reason for referral: Asthma/COPD   Call MD for:  difficulty breathing, headache or visual disturbances   Complete by: As directed    Diet - low sodium heart healthy   Complete by: As directed    Discharge instructions   Complete by: As directed    Follow with Primary MD  Ralene Ok, MD in 1-2 weeks  Neurology office will call you with a follow-up appointment.  Pulmonology office will call you with a follow-up appointment  Use home O2-2-3 L/minute 24/7.  Please get a complete blood count and chemistry panel checked by your Primary MD at your next visit, and again as instructed by your Primary MD.  Get Medicines reviewed and adjusted: Please take all your medications with you for your next visit with your Primary MD  Laboratory/radiological data: Please request your Primary MD to go over all hospital tests and procedure/radiological results at the follow up, please ask your Primary MD to get all Hospital records sent to his/her office.  In some cases, they will be blood work, cultures and biopsy results pending at the time of your discharge. Please request that your primary care M.D. follows up on these results.  Also Note the following: If you experience worsening of your admission symptoms, develop shortness of breath, life threatening emergency, suicidal or homicidal thoughts you must seek medical attention immediately by calling 911 or calling your MD immediately  if symptoms less  severe.  You must read complete instructions/literature along with all the possible adverse reactions/side effects for all the Medicines you take and that have been prescribed to you. Take any new Medicines after you have completely understood and accpet all the possible adverse reactions/side effects.   Do not drive when taking Pain medications or sleeping medications (Benzodaizepines)  Do not take more than prescribed Pain, Sleep and Anxiety Medications. It is not advisable to combine anxiety,sleep and pain medications without talking with your primary care practitioner  Special Instructions: If you have smoked or chewed Tobacco  in the last 2 yrs please stop smoking, stop any regular Alcohol  and or any Recreational drug use.  Wear Seat belts while driving.  Please note: You were cared for by a hospitalist during your hospital stay. Once you are discharged, your primary care physician will handle any further medical issues. Please note that NO REFILLS for any discharge medications will be authorized once you are discharged, as it is imperative that you return to your primary care physician (or establish a relationship with a primary care physician if you do not have one) for your post hospital discharge needs so that they can reassess your need for medications and monitor your lab values.   Increase activity slowly   Complete by: As directed  Allergies as of 06/07/2022       Reactions   Iodine Swelling   Shrimp [shellfish Allergy] Swelling   Fresh shrimp        Medication List     TAKE these medications    Accu-Chek Guide test strip Generic drug: glucose blood 1 each by Other route 2 (two) times daily.   Accu-Chek Softclix Lancets lancets 1 each by Other route 2 (two) times daily.   albuterol 108 (90 Base) MCG/ACT inhaler Commonly known as: VENTOLIN HFA Inhale 2 puffs into the lungs every 6 (six) hours as needed for wheezing or shortness of breath.   ALPRAZolam 1  MG tablet Commonly known as: XANAX Take 0.5-1 mg by mouth 2 (two) times daily as needed. For anxiety and sleep.   amoxicillin-clavulanate 875-125 MG tablet Commonly known as: AUGMENTIN Take 1 tablet by mouth every 12 (twelve) hours for 5 days.   aspirin EC 81 MG tablet Take 1 tablet (81 mg total) by mouth daily for 21 days. Swallow whole.   atorvastatin 40 MG tablet Commonly known as: LIPITOR Take 40 mg by mouth daily.   clopidogrel 75 MG tablet Commonly known as: PLAVIX Take 1 tablet (75 mg total) by mouth daily.   co-enzyme Q-10 30 MG capsule Take 30 mg by mouth 3 (three) times daily.   cyanocobalamin 1000 MCG tablet Commonly known as: VITAMIN B12 Take 1,000 mcg by mouth daily.   fluticasone-salmeterol 250-50 MCG/ACT Aepb Commonly known as: ADVAIR Inhale 1 puff into the lungs in the morning and at bedtime.   Incruse Ellipta 62.5 MCG/ACT Aepb Generic drug: umeclidinium bromide Inhale 1 puff into the lungs daily.   lisinopril 20 MG tablet Commonly known as: ZESTRIL Take 20 mg by mouth daily.   metFORMIN 500 MG 24 hr tablet Commonly known as: GLUCOPHAGE-XR Take 500 mg by mouth daily.   MULTIVITAMIN ADULT PO Take by mouth.   Myrbetriq 50 MG Tb24 tablet Generic drug: mirabegron ER Take 50 mg by mouth daily.   nicotine 14 mg/24hr patch Commonly known as: NICODERM CQ - dosed in mg/24 hours Place 1 patch (14 mg total) onto the skin daily.   OMEGA 3 500 PO Take 1 capsule by mouth daily. 1000 mg   Ozempic (1 MG/DOSE) 4 MG/3ML Sopn Generic drug: Semaglutide (1 MG/DOSE) Inject 1 mg into the skin once a week.   Evaristo Bury FlexTouch 100 UNIT/ML FlexTouch Pen Generic drug: insulin degludec Inject 12 Units into the skin daily. What changed: how much to take   triamcinolone cream 0.1 % Commonly known as: KENALOG Apply 1 application topically 3 (three) times daily.   VITAMIN D PO Take by mouth.               Durable Medical Equipment  (From admission,  onward)           Start     Ordered   06/06/22 0932  For home use only DME oxygen  Once       Question Answer Comment  Length of Need 6 Months   Mode or (Route) Nasal cannula   Liters per Minute 3   Frequency Continuous (stationary and portable oxygen unit needed)   Oxygen conserving device Yes   Oxygen delivery system Gas      06/06/22 0931            Follow-up Information     Ralene Ok, MD. Schedule an appointment as soon as possible for a visit in 1 week(s).   Specialty:  Internal Medicine Contact information: [id number] [place] [address] [phone]         [facility] Follow up.   Specialty: Pulmonology Why: Office will call with date/time, If you dont hear from them,please give them a call Contact information: [address] [place] [address] [phone]        [facility] Follow up.   Specialty: Neurology Why: Office will call with date/time, If you dont hear from them,please give them a call Contact information: [address] [place] [address] [phone]               Allergies  Allergen Reactions   Iodine Swelling   Shrimp [Shellfish Allergy] Swelling    Fresh shrimp     Other Procedures/Studies: VAS [place] LOWER EXTREMITY VENOUS (DVT)  Result Date: 06/07/2022  Lower Venous DVT Study Patient Name:  TARI WEITZMAN  Date of Exam:   06/07/2022 Medical Rec #: [id number]       Accession #:    [id number] Date of Birth: June 24, 1950       Patient Gender: F Patient Age:   48 years Exam Location:  [facility] Procedure:      VAS [place] LOWER EXTREMITY VENOUS (DVT) Referring Phys: Jeoffrey Massed --------------------------------------------------------------------------------  Indications: Swelling.  Comparison Study: No previous study. Performing Technologist: McKayla Maag RVT, [place]  Examination Guidelines: A  complete evaluation includes B-mode imaging, spectral Doppler, color Doppler, and power Doppler as needed of all accessible portions of each vessel. Bilateral testing is considered an integral part of a complete examination. Limited examinations for reoccurring indications may be performed as noted. The reflux portion of the exam is performed with the patient in reverse Trendelenburg.  +---------+---------------+---------+-----------+----------+--------------+ RIGHT    CompressibilityPhasicitySpontaneityPropertiesThrombus Aging +---------+---------------+---------+-----------+----------+--------------+ CFV      Full           Yes      Yes                                 +---------+---------------+---------+-----------+----------+--------------+ SFJ      Full                                                        +---------+---------------+---------+-----------+----------+--------------+ FV Prox  Full                                                        +---------+---------------+---------+-----------+----------+--------------+ FV Mid   Full                                                        +---------+---------------+---------+-----------+----------+--------------+ FV DistalFull                                                        +---------+---------------+---------+-----------+----------+--------------+  PFV      Full                                                        +---------+---------------+---------+-----------+----------+--------------+ POP      Full           Yes      Yes                                 +---------+---------------+---------+-----------+----------+--------------+ PTV      Full                                                        +---------+---------------+---------+-----------+----------+--------------+ PERO     Full                                                         +---------+---------------+---------+-----------+----------+--------------+   +---------+---------------+---------+-----------+----------+--------------+ LEFT     CompressibilityPhasicitySpontaneityPropertiesThrombus Aging +---------+---------------+---------+-----------+----------+--------------+ CFV      Full           Yes      Yes                                 +---------+---------------+---------+-----------+----------+--------------+ SFJ      Full                                                        +---------+---------------+---------+-----------+----------+--------------+ FV Prox  Full                                                        +---------+---------------+---------+-----------+----------+--------------+ FV Mid   Full                                                        +---------+---------------+---------+-----------+----------+--------------+ FV DistalFull                                                        +---------+---------------+---------+-----------+----------+--------------+ PFV      Full                                                        +---------+---------------+---------+-----------+----------+--------------+  POP      Full           Yes      Yes                                 +---------+---------------+---------+-----------+----------+--------------+ PTV      Full                                                        +---------+---------------+---------+-----------+----------+--------------+ PERO     Full                                                        +---------+---------------+---------+-----------+----------+--------------+     Summary: BILATERAL: - No evidence of deep vein thrombosis seen in the lower extremities, bilaterally. - No evidence of superficial venous thrombosis in the lower extremities, bilaterally. -No evidence of popliteal cyst, bilaterally.   *See table(s) above for measurements  and observations.    Preliminary    VAS US CAROTID  Result Date: 06/06/2022 Carotid Arterial Duplex Study Patient Name:  ISELLE SIDWELL  Date of Exam:   06/06/2022 Medical Rec #: [id number]       Accession #:    [id number] Date of Birth: 08/10/1950       Patient Gender: F Patient Age:   84 years Exam Location:  [facility] Procedure:      VAS US CAROTID Referring Phys: Jeoffrey Massed --------------------------------------------------------------------------------  Indications:       Speech disturbance and Recurrent TIA. Risk Factors:      Hypertension, hyperlipidemia, Diabetes, current smoker. Comparison Study:  No prior study Performing Technologist: Sherren Kerns RVS  Examination Guidelines: A complete evaluation includes B-mode imaging, spectral Doppler, color Doppler, and power Doppler as needed of all accessible portions of each vessel. Bilateral testing is considered an integral part of a complete examination. Limited examinations for reoccurring indications may be performed as noted.  Right Carotid Findings: +----------+--------+--------+--------+------------------+------------------+           PSV cm/sEDV cm/sStenosisPlaque DescriptionComments           +----------+--------+--------+--------+------------------+------------------+ CCA Prox  101     16                                intimal thickening +----------+--------+--------+--------+------------------+------------------+ CCA Distal114     19                                intimal thickening +----------+--------+--------+--------+------------------+------------------+ ICA Prox  109     24              heterogenous                         +----------+--------+--------+--------+------------------+------------------+ ICA Mid   112     36                                                   +----------+--------+--------+--------+------------------+------------------+  ICA Distal107     27                                                    +----------+--------+--------+--------+------------------+------------------+ ECA       124     12                                                   +----------+--------+--------+--------+------------------+------------------+ +----------+--------+-------+--------+-------------------+           PSV cm/sEDV cmsDescribeArm Pressure (mmHG) +----------+--------+-------+--------+-------------------+ [id number]                                        +----------+--------+-------+--------+-------------------+ +---------+--------+--+--------+-+ VertebralPSV cm/s34EDV cm/s4 +---------+--------+--+--------+-+  Left Carotid Findings: +----------+--------+--------+--------+------------------+------------------+           PSV cm/sEDV cm/sStenosisPlaque DescriptionComments           +----------+--------+--------+--------+------------------+------------------+ CCA Prox  110     19                                intimal thickening +----------+--------+--------+--------+------------------+------------------+ CCA Distal111     24                                intimal thickening +----------+--------+--------+--------+------------------+------------------+ ICA Prox  117     19              heterogenous                         +----------+--------+--------+--------+------------------+------------------+ ICA Mid   124     38                                                   +----------+--------+--------+--------+------------------+------------------+ ICA Distal110     39                                                   +----------+--------+--------+--------+------------------+------------------+ ECA       99      13                                                   +----------+--------+--------+--------+------------------+------------------+ +----------+--------+--------+--------+-------------------+           PSV cm/sEDV  cm/sDescribeArm Pressure (mmHG) +----------+--------+--------+--------+-------------------+ [id number]                                         +----------+--------+--------+--------+-------------------+ +---------+--------+--+--------+--+ VertebralPSV cm/s74EDV cm/s22 +---------+--------+--+--------+--+   Summary:  Right Carotid: Velocities in the right ICA are consistent with a 1-39% stenosis. Left Carotid: Velocities in the left ICA are consistent with a 1-39% stenosis. Vertebrals:  Bilateral vertebral arteries demonstrate antegrade flow. Subclavians: Normal flow hemodynamics were seen in bilateral subclavian              arteries. *See table(s) above for measurements and observations.  Electronically signed by Delia Heady MD on 06/06/2022 at 4:52:42 PM.    Final    DG Chest Port 1 View  Result Date: 06/05/2022 CLINICAL DATA:  Shortness of breath. EXAM: PORTABLE CHEST 1 VIEW COMPARISON:  06/04/2022. FINDINGS: Trachea is midline. Heart size stable. Perihilar and basilar predominant mixed interstitial and airspace opacification, similar to minimally progressive from yesterday's exam. Loop recorder projects over the left heart. IMPRESSION: Pulmonary edema with bibasilar atelectasis. Electronically Signed   By: Leanna Battles M.D.   On: 06/05/2022 12:57   EEG adult  Result Date: 06/04/2022 Charlsie Quest, MD     06/04/2022  3:06 PM Patient Name: Jorah Furse MRN: [id number] Epilepsy Attending: Charlsie Quest Referring Physician/Provider: Lynnae January, NP Date: 06/04/2022 Duration: 22.22 mins Patient history:  72 y.o. female with history of DM2, hypertension, hyperlipidemia, obesity, current smoker, multiple TIAs presented as code stroke 5/20 due to sudden onset of word finding difficulty/"gibberish speech ". EEG to evaluate for seizure. Level of alertness: Awake, asleep AEDs during EEG study: None Technical aspects: This EEG study was done with scalp electrodes positioned according to  the 10-20 International system of electrode placement. Electrical activity was reviewed with band pass filter of 1-70Hz , sensitivity of 7 uV/mm, display speed of 34mm/sec with a 60Hz  notched filter applied as appropriate. EEG data were recorded continuously and digitally stored.  Video monitoring was available and reviewed as appropriate. Description: The posterior dominant rhythm consists of 9-10 Hz activity of moderate voltage (25-35 uV) seen predominantly in posterior head regions, symmetric and reactive to eye opening and eye closing. Sleep was characterized by vertex waves, sleep spindles (12 to 14 Hz), maximal frontocentral region. EEG showed intermittent 3 to 6 Hz theta-delta slowing in left temporal region. Hyperventilation and photic stimulation were not performed.   ABNORMALITY - Intermittent slow, left temporal region IMPRESSION: This study is suggestive of cortical dysfunction arising from left temporal region nonspecific etiology. No seizures or epileptiform discharges were seen throughout the recording. Charlsie Quest   MR BRAIN WO CONTRAST  Result Date: 06/04/2022 CLINICAL DATA:  Neuro deficit, acute, stroke suspected. EXAM: MRI HEAD WITHOUT CONTRAST TECHNIQUE: Multiplanar, multiecho pulse sequences of the brain and surrounding structures were obtained without intravenous contrast. COMPARISON:  Head CT 06/03/2022.  MRI brain 09/07/2020. FINDINGS: Brain: No acute infarct or hemorrhage. Unchanged severe chronic small-vessel disease with old infarcts in the right parietal lobe, right corona radiata and left cerebellar hemisphere. No mass or midline shift. No hydrocephalus or extra-axial collection. No abnormal susceptibility. Vascular: Normal flow voids. Skull and upper cervical spine: Normal marrow signal. Sinuses/Orbits: Unremarkable. Other: None. IMPRESSION: 1. No acute intracranial process. 2. Unchanged severe chronic small-vessel disease with old infarcts in the right parietal lobe, right  corona radiata and left cerebellar hemisphere. Electronically Signed   By: Orvan Falconer M.D.   On: 06/04/2022 11:45   DG CHEST PORT 1 VIEW  Result Date: 06/04/2022 CLINICAL DATA:  [id number] Acute respiratory failure with hypoxia (HCC) [id number] EXAM: PORTABLE CHEST 1 VIEW COMPARISON:  CXR 10/05/20 FINDINGS: Loop recorder device projects over the left hemithorax. No pleural effusion.  No pneumothorax. There are hazy bibasilar airspace opacities could represent atelectasis or infection. Unchanged cardiac and mediastinal contours. No radiographically apparent displaced rib fractures. Visualized upper abdomen is unremarkable. IMPRESSION: Hazy bibasilar airspace opacities could represent atelectasis or infection. Electronically Signed   By: Lorenza Cambridge M.D.   On: 06/04/2022 10:42   ECHOCARDIOGRAM COMPLETE  Result Date: 06/04/2022    ECHOCARDIOGRAM REPORT   Patient Name:   AMBERT FALOR Date of Exam: 06/04/2022 Medical Rec #:  [id number]      Height:       62.0 in Accession #:    [id number]     Weight:       181.2 lb Date of Birth:  [birth year]      BSA:          1.833 m Patient Age:    71 years       BP:           138/62 mmHg Patient Gender: F              HR:           92 bpm. Exam Location:  Inpatient Procedure: 2D Echo, Color Doppler and Cardiac Doppler Indications:    R06.9 DOE  History:        Patient has prior history of Echocardiogram examinations, most                 recent 09/07/2020. Arrythmias:Atrial Fibrillation; Risk                 Factors:Hypertension.  Sonographer:    Irving Burton Senior RDCS Referring Phys: [id number] CAROLE N HALL IMPRESSIONS  1. Left ventricular ejection fraction, by estimation, is 65 to 70%. The left ventricle has hyperdynamic function. The left ventricle has no regional wall motion abnormalities. Left ventricular diastolic parameters are consistent with Grade I diastolic dysfunction (impaired relaxation).  2. Right ventricular systolic function is normal. The right ventricular size is  normal.  3. The mitral valve is normal in structure. No evidence of mitral valve regurgitation. No evidence of mitral stenosis.  4. The aortic valve is normal in structure. Aortic valve regurgitation is not visualized. No aortic stenosis is present.  5. The inferior vena cava is normal in size with greater than 50% respiratory variability, suggesting right atrial pressure of 3 mmHg. Comparison(s): Prior images reviewed side by side. Diastolic LV parameters are worse, but filling pressure at resr remains normal. FINDINGS  Left Ventricle: Left ventricular ejection fraction, by estimation, is 65 to 70%. The left ventricle has hyperdynamic function. The left ventricle has no regional wall motion abnormalities. The left ventricular internal cavity size was normal in size. There is no left ventricular hypertrophy. Left ventricular diastolic parameters are consistent with Grade I diastolic dysfunction (impaired relaxation). Normal left ventricular filling pressure. Right Ventricle: The right ventricular size is normal. No increase in right ventricular wall thickness. Right ventricular systolic function is normal. Left Atrium: Left atrial size was normal in size. Right Atrium: Right atrial size was normal in size. Pericardium: There is no evidence of pericardial effusion. Mitral Valve: The mitral valve is normal in structure. No evidence of mitral valve regurgitation. No evidence of mitral valve stenosis. Tricuspid Valve: The tricuspid valve is normal in structure. Tricuspid valve regurgitation is not demonstrated. No evidence of tricuspid stenosis. Aortic Valve: The aortic valve is normal in structure. Aortic valve regurgitation is not visualized. No aortic stenosis is present. Aortic valve mean gradient measures 6.0 mmHg. Aortic valve peak gradient  measures 10.1 mmHg. Aortic valve area, by VTI measures 1.88 cm. Pulmonic Valve: The pulmonic valve was normal in structure. Pulmonic valve regurgitation is not visualized. No  evidence of pulmonic stenosis. Aorta: The aortic root is normal in size and structure. Venous: The inferior vena cava is normal in size with greater than 50% respiratory variability, suggesting right atrial pressure of 3 mmHg. IAS/Shunts: No atrial level shunt detected by color flow Doppler.  LEFT VENTRICLE PLAX 2D LVIDd:         3.70 cm   Diastology LVIDs:         2.90 cm   LV e' medial:    6.42 cm/s LV PW:         1.10 cm   LV E/e' medial:  10.2 LV IVS:        0.95 cm   LV e' lateral:   7.07 cm/s LVOT diam:     1.80 cm   LV E/e' lateral: 9.3 LV SV:         63 LV SV Index:   35 LVOT Area:     2.54 cm  RIGHT VENTRICLE RV S prime:     7.62 cm/s TAPSE (M-mode): 1.3 cm LEFT ATRIUM           Index        RIGHT ATRIUM          Index LA Vol (A2C): 18.6 ml 10.15 ml/m  RA Area:     9.01 cm LA Vol (A4C): 25.9 ml 14.13 ml/m  RA Volume:   16.10 ml 8.78 ml/m  AORTIC VALVE AV Area (Vmax):    1.84 cm AV Area (Vmean):   1.71 cm AV Area (VTI):     1.88 cm AV Vmax:           159.00 cm/s AV Vmean:          121.000 cm/s AV VTI:            0.337 m AV Peak Grad:      10.1 mmHg AV Mean Grad:      6.0 mmHg LVOT Vmax:         115.00 cm/s LVOT Vmean:        81.400 cm/s LVOT VTI:          0.249 m LVOT/AV VTI ratio: 0.74  AORTA Ao Root diam: 2.90 cm Ao Asc diam:  3.00 cm MITRAL VALVE MV Area (PHT): 2.55 cm    SHUNTS MV Decel Time: 298 msec    Systemic VTI:  0.25 m MV E velocity: 65.60 cm/s  Systemic Diam: 1.80 cm MV A velocity: 92.00 cm/s MV E/A ratio:  0.71 Mihai Croitoru MD Electronically signed by Thurmon Fair MD Signature Date/Time: 06/04/2022/9:18:44 AM    Final    CT HEAD CODE STROKE WO CONTRAST  Result Date: 06/03/2022 CLINICAL DATA:  Code stroke. Neuro deficit, acute, stroke suspected. Aphasia. EXAM: CT HEAD WITHOUT CONTRAST TECHNIQUE: Contiguous axial images were obtained from the base of the skull through the vertex without intravenous contrast. RADIATION DOSE REDUCTION: This exam was performed according to the  departmental dose-optimization program which includes automated exposure control, adjustment of the mA and/or kV according to patient size and/or use of iterative reconstruction technique. COMPARISON:  Head CT 09/06/2020. FINDINGS: Brain: No acute hemorrhage. 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 No new loss of gray-white differentiation. No hydrocephalus or extra-axial collection. No mass effect or  midline shift. Vascular: No hyperdense vessel or unexpected calcification. Skull: No calvarial fracture or suspicious bone lesion. Skull base is unremarkable. Sinuses/Orbits: Unremarkable. Other: None. ASPECTS [facility] Stroke Program Early CT Score) - Ganglionic level infarction (caudate, lentiform nuclei, internal capsule, insula, M1-M3 cortex): 7 - Supraganglionic infarction (M4-M6 cortex): 3 Total score (0-10 with 10 being normal): 10 IMPRESSION: 1. No acute hemorrhage or evidence of evolving large vessel territory infarct. ASPECT score is 10. 2. 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 Code stroke imaging results were communicated on 06/03/2022 at 8:04 pm to provider Dr. Derry Lory via secure text paging. Electronically Signed   By: Orvan Falconer M.D.   On: 06/03/2022 20:05   CUP PACEART REMOTE DEVICE CHECK  Result Date: 06/03/2022 ILR summary report received. Battery status OK. Normal device function. No new symptom, tachy, brady, or pause episodes. No new AF episodes. Monthly summary reports and ROV/PRN , CVRS    TODAY-DAY OF DISCHARGE:  Subjective:   Allison Hughes today has no headache,no chest abdominal pain,no new weakness tingling or numbness, feels much better wants to go home today.   Objective:   Blood pressure 104/62, pulse 93, temperature 97.7 F (36.5 C), temperature source Oral, resp. rate 19, height 5'  2" (1.575 m), weight 82.2 kg, SpO2 (!) 89 %.  Intake/Output Summary (Last 24 hours) at 06/07/2022 1234 Last data filed at 06/06/2022 2100 Gross per 24 hour  Intake 480 ml  Output 350 ml  Net 130 ml   Filed Weights   06/03/22 1900  Weight: 82.2 kg    Exam: Awake Alert, Oriented *3, No new F.N deficits, Normal affect [place].AT,PERRAL Supple Neck,No JVD, No cervical lymphadenopathy appriciated.  Symmetrical Chest wall movement, Good air movement bilaterally, CTAB RRR,No Gallops,Rubs or new Murmurs, No Parasternal Heave +ve B.Sounds, Abd Soft, Non tender, No organomegaly appriciated, No rebound -guarding or rigidity. No Cyanosis, Clubbing or edema, No new Rash or bruise   PERTINENT RADIOLOGIC STUDIES: VAS [place] LOWER EXTREMITY VENOUS (DVT)  Result Date: 06/07/2022  Lower Venous DVT Study Patient Name:  GIONNA ECKE  Date of Exam:   06/07/2022 Medical Rec #: [id number]       Accession #:    [id number] Date of Birth: [birth year]       Patient Gender: F Patient Age:   18 years Exam Location:  [facility] Procedure:      VAS [place] LOWER EXTREMITY VENOUS (DVT) Referring Phys: Jeoffrey Massed --------------------------------------------------------------------------------  Indications: Swelling.  Comparison Study: No previous study. Performing Technologist: McKayla Maag RVT, [place]  Examination Guidelines: A complete evaluation includes B-mode imaging, spectral Doppler, color Doppler, and power Doppler as needed of all accessible portions of each vessel. Bilateral testing is considered an integral part of a complete examination. Limited examinations for reoccurring indications may be performed as noted. The reflux portion of the exam is performed with the patient in reverse Trendelenburg.  +---------+---------------+---------+-----------+----------+--------------+ RIGHT    CompressibilityPhasicitySpontaneityPropertiesThrombus Aging  +---------+---------------+---------+-----------+----------+--------------+ CFV      Full           Yes      Yes                                 +---------+---------------+---------+-----------+----------+--------------+ SFJ      Full                                                        +---------+---------------+---------+-----------+----------+--------------+  FV Prox  Full                                                        +---------+---------------+---------+-----------+----------+--------------+ FV Mid   Full                                                        +---------+---------------+---------+-----------+----------+--------------+ FV DistalFull                                                        +---------+---------------+---------+-----------+----------+--------------+ PFV      Full                                                        +---------+---------------+---------+-----------+----------+--------------+ POP      Full           Yes      Yes                                 +---------+---------------+---------+-----------+----------+--------------+ PTV      Full                                                        +---------+---------------+---------+-----------+----------+--------------+ PERO     Full                                                        +---------+---------------+---------+-----------+----------+--------------+   +---------+---------------+---------+-----------+----------+--------------+ LEFT     CompressibilityPhasicitySpontaneityPropertiesThrombus Aging +---------+---------------+---------+-----------+----------+--------------+ CFV      Full           Yes      Yes                                 +---------+---------------+---------+-----------+----------+--------------+ SFJ      Full                                                         +---------+---------------+---------+-----------+----------+--------------+ FV Prox  Full                                                        +---------+---------------+---------+-----------+----------+--------------+  FV Mid   Full                                                        +---------+---------------+---------+-----------+----------+--------------+ FV DistalFull                                                        +---------+---------------+---------+-----------+----------+--------------+ PFV      Full                                                        +---------+---------------+---------+-----------+----------+--------------+ POP      Full           Yes      Yes                                 +---------+---------------+---------+-----------+----------+--------------+ PTV      Full                                                        +---------+---------------+---------+-----------+----------+--------------+ PERO     Full                                                        +---------+---------------+---------+-----------+----------+--------------+     Summary: BILATERAL: - No evidence of deep vein thrombosis seen in the lower extremities, bilaterally. - No evidence of superficial venous thrombosis in the lower extremities, bilaterally. -No evidence of popliteal cyst, bilaterally.   *See table(s) above for measurements and observations.    Preliminary    VAS US CAROTID  Result Date: 06/06/2022 Carotid Arterial Duplex Study Patient Name:  MELIYAH TOMPKINS  Date of Exam:   06/06/2022 Medical Rec #: [id number]       Accession #:    [id number] Date of Birth: 10-13-1950       Patient Gender: F Patient Age:   59 years Exam Location:  [facility] Procedure:      VAS US CAROTID Referring Phys: Jeoffrey Massed --------------------------------------------------------------------------------  Indications:       Speech disturbance and Recurrent  TIA. Risk Factors:      Hypertension, hyperlipidemia, Diabetes, current smoker. Comparison Study:  No prior study Performing Technologist: Sherren Kerns RVS  Examination Guidelines: A complete evaluation includes B-mode imaging, spectral Doppler, color Doppler, and power Doppler as needed of all accessible portions of each vessel. Bilateral testing is considered an integral part of a complete examination. Limited examinations for reoccurring indications may be performed as noted.  Right Carotid Findings: +----------+--------+--------+--------+------------------+------------------+           PSV cm/sEDV cm/sStenosisPlaque DescriptionComments           +----------+--------+--------+--------+------------------+------------------+  CCA Prox  101     16                                intimal thickening +----------+--------+--------+--------+------------------+------------------+ CCA Distal114     19                                intimal thickening +----------+--------+--------+--------+------------------+------------------+ ICA Prox  109     24              heterogenous                         +----------+--------+--------+--------+------------------+------------------+ ICA Mid   112     36                                                   +----------+--------+--------+--------+------------------+------------------+ ICA Distal107     27                                                   +----------+--------+--------+--------+------------------+------------------+ ECA       124     12                                                   +----------+--------+--------+--------+------------------+------------------+ +----------+--------+-------+--------+-------------------+           PSV cm/sEDV cmsDescribeArm Pressure (mmHG) +----------+--------+-------+--------+-------------------+ [id number]                                         +----------+--------+-------+--------+-------------------+ +---------+--------+--+--------+-+ VertebralPSV cm/s34EDV cm/s4 +---------+--------+--+--------+-+  Left Carotid Findings: +----------+--------+--------+--------+------------------+------------------+           PSV cm/sEDV cm/sStenosisPlaque DescriptionComments           +----------+--------+--------+--------+------------------+------------------+ CCA Prox  110     19                                intimal thickening +----------+--------+--------+--------+------------------+------------------+ CCA Distal111     24                                intimal thickening +----------+--------+--------+--------+------------------+------------------+ ICA Prox  117     19              heterogenous                         +----------+--------+--------+--------+------------------+------------------+ ICA Mid   124     38                                                   +----------+--------+--------+--------+------------------+------------------+  ICA Distal110     39                                                   +----------+--------+--------+--------+------------------+------------------+ ECA       99      13                                                   +----------+--------+--------+--------+------------------+------------------+ +----------+--------+--------+--------+-------------------+           PSV cm/sEDV cm/sDescribeArm Pressure (mmHG) +----------+--------+--------+--------+-------------------+ [id number]                                         +----------+--------+--------+--------+-------------------+ +---------+--------+--+--------+--+ VertebralPSV cm/s74EDV cm/s22 +---------+--------+--+--------+--+   Summary: Right Carotid: Velocities in the right ICA are consistent with a 1-39% stenosis. Left Carotid: Velocities in the left ICA are consistent with a 1-39% stenosis. Vertebrals:   Bilateral vertebral arteries demonstrate antegrade flow. Subclavians: Normal flow hemodynamics were seen in bilateral subclavian              arteries. *See table(s) above for measurements and observations.  Electronically signed by Delia Heady MD on 06/06/2022 at 4:52:42 PM.    Final    DG Chest Port 1 View  Result Date: 06/05/2022 CLINICAL DATA:  Shortness of breath. EXAM: PORTABLE CHEST 1 VIEW COMPARISON:  06/04/2022. FINDINGS: Trachea is midline. Heart size stable. Perihilar and basilar predominant mixed interstitial and airspace opacification, similar to minimally progressive from yesterday's exam. Loop recorder projects over the left heart. IMPRESSION: Pulmonary edema with bibasilar atelectasis. Electronically Signed   By: Leanna Battles M.D.   On: 06/05/2022 12:57     PERTINENT LAB RESULTS: CBC: Recent Labs    06/05/22 0414 06/07/22 0712  WBC 11.7* 8.6  HGB 11.1* 11.2*  HCT 36.5 36.6  PLT 347 347   CMET CMP     Component Value Date/Time   NA 137 06/07/2022 0712   K 3.7 06/07/2022 0712   CL 104 06/07/2022 0712   CO2 25 06/07/2022 0712   GLUCOSE 133 (H) 06/07/2022 0712   BUN 15 06/07/2022 0712   CREATININE 0.68 06/07/2022 0712   CALCIUM 9.3 06/07/2022 0712   PROT 6.4 (L) 06/07/2022 0712   ALBUMIN 2.8 (L) 06/07/2022 0712   AST 22 06/07/2022 0712   ALT 17 06/07/2022 0712   ALKPHOS 82 06/07/2022 0712   BILITOT 0.2 (L) 06/07/2022 0712   GFRNONAA >60 06/07/2022 0712   GFRAA >60 02/05/2016 0651    GFR Estimated Creatinine Clearance: 65 mL/min (by C-G formula based on SCr of 0.68 mg/dL). No results for input(s): "LIPASE", "AMYLASE" in the last 72 hours. No results for input(s): "CKTOTAL", "CKMB", "CKMBINDEX", "TROPONINI" in the last 72 hours. Invalid input(s): "POCBNP" No results for input(s): "DDIMER" in the last 72 hours. No results for input(s): "HGBA1C" in the last 72 hours.  No results for input(s): "CHOL", "HDL", "LDLCALC", "TRIG", "CHOLHDL", "LDLDIRECT" in the  last 72 hours.  No results for input(s): "TSH", "T4TOTAL", "T3FREE", "THYROIDAB" in the last 72 hours.  Invalid input(s): "FREET3"  Recent Labs    06/04/22 1356 06/05/22  0414  VITAMINB12 1,920* 1,467*  FOLATE  --  27.9  FERRITIN  --  8*  TIBC  --  386  IRON  --  31  RETICCTPCT  --  1.4   Coags: No results for input(s): "INR" in the last 72 hours.  Invalid input(s): "PT"  Microbiology: No results found for this or any previous visit (from the past 240 hour(s)).  FURTHER DISCHARGE INSTRUCTIONS:  Get Medicines reviewed and adjusted: Please take all your medications with you for your next visit with your Primary MD  Laboratory/radiological data: Please request your Primary MD to go over all hospital tests and procedure/radiological results at the follow up, please ask your Primary MD to get all Hospital records sent to his/her office.  In some cases, they will be blood work, cultures and biopsy results pending at the time of your discharge. Please request that your primary care M.D. goes through all the records of your hospital data and follows up on these results.  Also Note the following: If you experience worsening of your admission symptoms, develop shortness of breath, life threatening emergency, suicidal or homicidal thoughts you must seek medical attention immediately by calling 911 or calling your MD immediately  if symptoms less severe.  You must read complete instructions/literature along with all the possible adverse reactions/side effects for all the Medicines you take and that have been prescribed to you. Take any new Medicines after you have completely understood and accpet all the possible adverse reactions/side effects.   Do not drive when taking Pain medications or sleeping medications (Benzodaizepines)  Do not take more than prescribed Pain, Sleep and Anxiety Medications. It is not advisable to combine anxiety,sleep and pain medications without talking with  your primary care practitioner  Special Instructions: If you have smoked or chewed Tobacco  in the last 2 yrs please stop smoking, stop any regular Alcohol  and or any Recreational drug use.  Wear Seat belts while driving.  Please note: You were cared for by a hospitalist during your hospital stay. Once you are discharged, your primary care physician will handle any further medical issues. Please note that NO REFILLS for any discharge medications will be authorized once you are discharged, as it is imperative that you return to your primary care physician (or establish a relationship with a primary care physician if you do not have one) for your post hospital discharge needs so that they can reassess your need for medications and monitor your lab values.  Total Time spent coordinating discharge including counseling, education and face to face time equals greater than 30 minutes.  SignedJeoffrey Massed 06/07/2022 12:34 PM

## 2022-06-06 NOTE — [place] FL2 (Signed)
[place] MEDICAID FL2 LEVEL OF CARE FORM     IDENTIFICATION  Patient Name: Allison Hughes Birthdate: 10/18/1950 Sex: female Admission Date (Current Location): 06/03/2022  [facility] and [place] Number:  Producer, television/film/video and Address:  [facility] [place], [address], [place], [place] [address]      Provider Number: [id number]  Attending Physician Name and Address:  Maretta Bees, MD  Relative Name and Phone Number:       Current Level of Care: Hospital Recommended Level of Care: Skilled Nursing Facility Prior Approval Number:    Date Approved/Denied:   PASRR Number: [id number]  Discharge Plan: SNF    Current Diagnoses: Patient Active Problem List   Diagnosis Date Noted   Aphasia 06/03/2022   Pneumonia 09/06/2020   CVA (cerebral vascular accident) (HCC) 09/06/2020   Type 2 diabetes mellitus without complication, without long-term current use of insulin (HCC) 11/20/2018   Type 2 diabetes mellitus with hyperglycemia, without long-term current use of insulin (HCC) 09/18/2018   Sepsis (HCC) 02/02/2016   Acute pyelonephritis 02/02/2016   Hypokalemia 02/02/2016   Anxiety 02/02/2016   Essential hypertension 02/02/2016    Orientation RESPIRATION BLADDER Height & Weight     Self, Time, Situation, Place  O2 (3L Nasal cannula) Incontinent Weight: 181 lb 3.5 oz (82.2 kg) Height:  5\' 2"  (157.5 cm)  BEHAVIORAL SYMPTOMS/MOOD NEUROLOGICAL BOWEL NUTRITION STATUS      Continent Diet (See dc summary)  AMBULATORY STATUS COMMUNICATION OF NEEDS Skin   Limited Assist Verbally Normal                       Personal Care Assistance Level of Assistance  Bathing, Feeding, Dressing Bathing Assistance: Maximum assistance Feeding assistance: Independent Dressing Assistance: Limited assistance     Functional Limitations Info  Hearing, Sight Sight Info: Impaired Hearing Info: Impaired      SPECIAL CARE FACTORS FREQUENCY  PT (By licensed PT), OT (By  licensed OT)     PT Frequency: 5x/week OT Frequency: 5x/week            Contractures Contractures Info: Not present    Additional Factors Info  Code Status, Allergies Code Status Info: Full Allergies Info: Iodine, Shrimp (Shellfish Allergy)           Current Medications (06/06/2022):  This is the current hospital active medication list Current Facility-Administered Medications  Medication Dose Route Frequency Provider Last Rate Last Admin   acetaminophen (TYLENOL) tablet 650 mg  650 mg Oral Q6H PRN Hall, Carole N, DO       albuterol (PROVENTIL) (2.5 MG/3ML) 0.083% nebulizer solution 2.5 mg  2.5 mg Nebulization Q2H PRN Ghimire, Werner Lean, MD       arformoterol (BROVANA) nebulizer solution 15 mcg  15 mcg Nebulization BID Maretta Bees, MD       aspirin EC tablet 81 mg  81 mg Oral Daily Darlin Drop, DO   81 mg at 06/06/22 0935   atorvastatin (LIPITOR) tablet 40 mg  40 mg Oral Daily Dow Adolph N, DO   40 mg at 06/06/22 0935   budesonide (PULMICORT) nebulizer solution 0.25 mg  0.25 mg Nebulization BID Maretta Bees, MD       cholecalciferol (VITAMIN D3) 25 MCG (1000 UNIT) tablet 1,000 Units  1,000 Units Oral Daily Dow Adolph N, DO   1,000 Units at 06/06/22 0935   clopidogrel (PLAVIX) tablet 75 mg  75 mg Oral Daily Ghimire, Shanker M,  MD   75 mg at 06/06/22 1318   cyanocobalamin (VITAMIN B12) tablet 1,000 mcg  1,000 mcg Oral Daily Dow Adolph N, DO   1,000 mcg at 06/06/22 0935   enoxaparin (LOVENOX) injection 40 mg  40 mg Subcutaneous Q24H Dow Adolph N, DO   40 mg at 06/06/22 0935   guaiFENesin (MUCINEX) 12 hr tablet 600 mg  600 mg Oral BID Maretta Bees, MD   600 mg at 06/06/22 0935   insulin aspart (novoLOG) injection 0-5 Units  0-5 Units Subcutaneous QHS Hall, Carole N, DO       insulin aspart (novoLOG) injection 0-9 Units  0-9 Units Subcutaneous TID WC Dow Adolph N, DO   1 Units at 06/06/22 1318   ipratropium-albuterol (DUONEB) 0.5-2.5 (3) MG/3ML nebulizer  solution 3 mL  3 mL Nebulization BID Maretta Bees, MD   3 mL at 06/06/22 0842   lisinopril (ZESTRIL) tablet 20 mg  20 mg Oral Daily Dow Adolph N, DO   20 mg at 06/05/22 1610   melatonin tablet 3 mg  3 mg Oral QHS Maretta Bees, MD   3 mg at 06/05/22 2135   Oral care mouth rinse  15 mL Mouth Rinse PRN Sheikh, Omair Latif, DO       polyethylene glycol (MIRALAX / GLYCOLAX) packet 17 g  17 g Oral Daily PRN Dow Adolph N, DO       prochlorperazine (COMPAZINE) injection 5 mg  5 mg Intravenous Q6H PRN Hall, Carole N, DO       QUEtiapine (SEROQUEL) tablet 50 mg  50 mg Oral QHS Ghimire, Werner Lean, MD       revefenacin (YUPELRI) nebulizer solution 175 mcg  175 mcg Nebulization Daily Ghimire, Werner Lean, MD         Discharge Medications: Please see discharge summary for a list of discharge medications.  Relevant Imaging Results:  Relevant Lab Results:   Additional Information SSN: [id number] [place], [place]

## 2022-06-06 NOTE — Progress Notes (Signed)
SaO2 on room air at rest = 87% SaO2 on 3 liters of O2 while at rest = 90%  Required oxygen while at rest.

## 2022-06-06 NOTE — Progress Notes (Signed)
Per Dr. Imogene Burn, pt's husband/significant other, Delories Heinz is to have security escort to unit for visitation with search prior to visit. Security notified. Charge nurse, Cala Bradford made aware.

## 2022-06-06 NOTE — Progress Notes (Signed)
VASCULAR LAB    Carotid duplex has been performed.  See CV proc for preliminary results.   Chelsie Burel, RVT 06/06/2022, 11:33 AM

## 2022-06-06 NOTE — Progress Notes (Signed)
Met with significant other-patient at bedside Both are requesting discharge-she does not want to remain hospitalized any longer She was again somewhat confused last night-and got 1 dose of as needed Haldol This morning she is relatively awake/alert-per significant other she is back to her baseline She continues to have hospital delirium-hope is that if she gets back to her familiar surroundings-she will not have these episodes any longer. Both patient/significant other aware that she will likely need home O2-they are okay with it. Patient's significant other will provide 24/7 supervision Will order home O2-and plan for discharge today as requested by patient/family.

## 2022-06-06 NOTE — Progress Notes (Addendum)
Bed alarm noted alarming, upon entry  to room pt noted standing up by the corner closet looking for her bags. Pt given belongings and assisted back to bed with alarm reactivated, mat noted on floor and in place. Pt encouraged to use call light for assist with needs.

## 2022-06-06 NOTE — Progress Notes (Signed)
Upon going in to review discharge instructions, patient working with [facility] PT at this time.  Patient unsteady, requiring frequent cues, 2 person assist with steadying to stand. Not safe to discharge home as she would be alone and unable to get into her home with stairs.  Dr. Thomasene Mohair notified of above safety concerns, discharge orders cancelled at this time.

## 2022-06-06 NOTE — TOC Transition Note (Signed)
Transition of Care [facility]) - CM/SW Discharge Note   Patient Details  Name: Allison Hughes MRN: [id number] Date of Birth: 08/10/1950  [facility] CM/SW Contact:  Gordy Clement, RN Phone Number: 06/06/2022, 11:11 AM   Clinical Narrative:    Patient will DC to home today w/significant other. Patient is being recommended Home Health PT and OT. Frances Furbish will provide services (Choice/insurance).  Patient is requiring O2 and will be provided by [employer].   Significant other will be transporting home        Barriers to Discharge: Continued Medical Work up   Patient Goals and CMS Choice      Discharge Placement                         Discharge Plan and Services Additional resources added to the After Visit Summary for                                       Social Determinants of Health (SDOH) Interventions SDOH Screenings   Depression (PHQ2-9): Low Risk  (10/26/2020)  Tobacco Use: High Risk (10/26/2020)     Readmission Risk Interventions     No data to display

## 2022-06-06 NOTE — Progress Notes (Signed)
  Inpatient Rehab Admissions Coordinator :  Per therapy change in recommendations prior to discharge from [facility] to CIR/AIR, patient was screened for CIR candidacy by Ottie Glazier RN MSN.  At this time patient appears to be a potential candidate for CIR. We will assess further for medical neccesity criteria as well as caregiver supports. I will place a rehab consult per protocol for full assessment. Please call me with any questions.  Ottie Glazier RN MSN Admissions Coordinator [phone]

## 2022-06-06 NOTE — Progress Notes (Addendum)
[name] husband present on unit and apparently came to bring her some prescription medication from home per her request. As I entered pt's room, husband had just given her some "xanax", which he confirmed. I explained to pt's husband that we are giving her medication here at the hospital and need to be aware of all meds consumed to monitor for any adverse effects or contraindications. Pt has been having some issues with delirium, particularly at night and has just recently had some medication changes to address this. Pt is stable at this time. Security present with husband for escort purposes, however was unaware of his intentions. Husband verbalizes understanding and has since left and took medications with him. Dr. Imogene Burn made aware.

## 2022-06-07 ENCOUNTER — Inpatient Hospital Stay (HOSPITAL_COMMUNITY): Payer: 59

## 2022-06-07 ENCOUNTER — Other Ambulatory Visit (HOSPITAL_COMMUNITY): Payer: Self-pay

## 2022-06-07 DIAGNOSIS — M7989 Other specified soft tissue disorders: Secondary | ICD-10-CM

## 2022-06-07 LAB — CBC
HCT: 36.6 % (ref 36.0–46.0)
Hemoglobin: 11.2 g/dL — ABNORMAL LOW (ref 12.0–15.0)
MCH: 23.6 pg — ABNORMAL LOW (ref 26.0–34.0)
MCHC: 30.6 g/dL (ref 30.0–36.0)
MCV: 77.1 fL — ABNORMAL LOW (ref 80.0–100.0)
Platelets: 347 10*3/uL (ref 150–400)
RBC: 4.75 MIL/uL (ref 3.87–5.11)
RDW: 14 % (ref 11.5–15.5)
WBC: 8.6 10*3/uL (ref 4.0–10.5)
nRBC: 0 % (ref 0.0–0.2)

## 2022-06-07 LAB — GLUCOSE, CAPILLARY
Glucose-Capillary: 139 mg/dL — ABNORMAL HIGH (ref 70–99)
Glucose-Capillary: 156 mg/dL — ABNORMAL HIGH (ref 70–99)

## 2022-06-07 LAB — COMPREHENSIVE METABOLIC PANEL
ALT: 17 U/L (ref 0–44)
AST: 22 U/L (ref 15–41)
Albumin: 2.8 g/dL — ABNORMAL LOW (ref 3.5–5.0)
Alkaline Phosphatase: 82 U/L (ref 38–126)
Anion gap: 8 (ref 5–15)
BUN: 15 mg/dL (ref 8–23)
CO2: 25 mmol/L (ref 22–32)
Calcium: 9.3 mg/dL (ref 8.9–10.3)
Chloride: 104 mmol/L (ref 98–111)
Creatinine, Ser: 0.68 mg/dL (ref 0.44–1.00)
GFR, Estimated: >60 mL/min (ref >60)
Glucose, Bld: 133 mg/dL — ABNORMAL HIGH (ref 70–99)
Potassium: 3.7 mmol/L (ref 3.5–5.1)
Sodium: 137 mmol/L (ref 135–145)
Total Bilirubin: 0.2 mg/dL — ABNORMAL LOW (ref 0.3–1.2)
Total Protein: 6.4 g/dL — ABNORMAL LOW (ref 6.5–8.1)

## 2022-06-07 LAB — MAGNESIUM: Magnesium: 1.8 mg/dL (ref 1.7–2.4)

## 2022-06-07 LAB — PROCALCITONIN: Procalcitonin: <0.1 ng/mL

## 2022-06-07 MED ORDER — AMOXICILLIN-POT CLAVULANATE 875-125 MG PO TABS
1.0000 | ORAL_TABLET | Freq: Two times a day (BID) | ORAL | 0 refills | Status: AC
  Filled 2022-06-07: qty 10, 5d supply, fill #0

## 2022-06-07 MED ORDER — ALPRAZOLAM 0.5 MG PO TABS: 0.5000 mg | ORAL_TABLET | Freq: Two times a day (BID) | ORAL | Status: DC | PRN

## 2022-06-07 MED ORDER — AMOXICILLIN-POT CLAVULANATE 875-125 MG PO TABS: 1.0000 | ORAL_TABLET | Freq: Two times a day (BID) | ORAL | Status: DC

## 2022-06-07 NOTE — Progress Notes (Signed)
Physical Therapy Treatment Patient Details Name: Allison Hughes MRN: [id number] DOB: 04-26-1950 Today's Date: 06/07/2022   History of Present Illness Pt is 72 yo female who presents on 06/04/22 with aphasia, word finding difficulties and word salad per significant other.  MRI neg for acute changes.  PMHx significant for DMII, HTN and DLD, CVA R parietal, R corona radiata, and L cerebellar regions    PT Comments    Pt greeted supine in bed and agreeable to session, eager for OOB mobility. Pt continues to require increased cues for safety, attention, problem solving and sequencing. Pt able to come to sitting EOB with min guard for safety and transfers to stand with min A to steady on rise. Pt able to progress gait with HHA x1 this session and mod A throughout gait to steady and prevent LOB in all directions. Pt continues to demonstrate some impulsivity, attempting to stand before this PTA ready and despite cues to wait. Current plan remains appropriate to address deficits and maximize functional independence and decrease caregiver burden, however if family declines, pt will need max HH services and strict 24/7 supervision for safety. Pt continues to benefit from skilled PT services to progress toward functional mobility goals.    Recommendations for follow up therapy are one component of a multi-disciplinary discharge planning process, led by the attending physician.  Recommendations may be updated based on patient status, additional functional criteria and insurance authorization.  Follow Up Recommendations       Assistance Recommended at Discharge Frequent or constant Supervision/Assistance  Patient can return home with the following A lot of help with walking and/or transfers;A lot of help with bathing/dressing/bathroom;Assistance with cooking/housework;Direct supervision/assist for medications management;Assist for transportation;Help with stairs or ramp for entrance   Equipment  Recommendations  Wheelchair (measurements PT);Wheelchair cushion (measurements PT);BSC/3in1;Rolling walker (2 wheels)    Recommendations for Other Services       Precautions / Restrictions Precautions Precautions: Fall Restrictions Weight Bearing Restrictions: No     Mobility  Bed Mobility Overal bed mobility: Needs Assistance Bed Mobility: Supine to Sit, Sit to Supine     Supine to sit: Min guard, HOB elevated (with cues and extra time for processing and motor planning) Sit to supine: Supervision (with extra time and cues for technique)   General bed mobility comments: min guard with increased time to come to EOB, cues to maintain sitting EOB as pt with some impulsivity, Pt requires max assist +2 to scoot up in bed    Transfers Overall transfer level: Needs assistance Equipment used: 1 person hand held assist, Rolling walker (2 wheels) (and use of gait belt) Transfers: Sit to/from Stand, Bed to chair/wheelchair/BSC Sit to Stand: Min guard, Min assist (with cues and extra time)           General transfer comment: min A to steady on rise with HHA, cues for hand placement on rise to RW, pt benefits from short straightforward cues    Ambulation/Gait Ambulation/Gait assistance: Mod assist, Min assist Gait Distance (Feet): 10 Feet (+ 20') Assistive device: Rolling walker (2 wheels), 1 person hand held assist Gait Pattern/deviations: Decreased stride length, Trunk flexed, Narrow base of support, Ataxic, Knee flexed in stance - left, Knee flexed in stance - right Gait velocity: decr     General Gait Details: min A with RW support to manage RW and assist for balance, pt with noted difficutly sequencing with RW, HHA x1 for additonal 20' with mod A needed to maintain balance   Stairs  Wheelchair Mobility    Modified Rankin (Stroke Patients Only)       Balance Overall balance assessment: Needs assistance Sitting-balance support: Feet supported, No  upper extremity supported Sitting balance-Leahy Scale: Fair Sitting balance - Comments: Pt requires cues and extra time to bring feet to floor sitting EOB. Postural control: Posterior lean (mild, able to self correct with cues) Standing balance support: Single extremity supported, Bilateral upper extremity supported, During functional activity (Reliant on AD or hand held assist for balance) Standing balance-Leahy Scale: Poor Standing balance comment: reliant on UE support                            Cognition Arousal/Alertness: Awake/alert Behavior During Therapy: Flat affect, Impulsive Overall Cognitive Status: Impaired/Different from baseline Area of Impairment: Attention, Memory, Following commands, Safety/judgement, Awareness, Problem solving                   Current Attention Level: Focused Memory: Decreased recall of precautions, Decreased short-term memory Following Commands: Follows one step commands with increased time, Follows one step commands inconsistently Safety/Judgement: Decreased awareness of safety, Decreased awareness of deficits Awareness: Intellectual Problem Solving: Slow processing, Decreased initiation, Difficulty sequencing, Requires verbal cues General Comments: Pt requires increased time for processing and benefits from verbal and visual cues.        Exercises      General Comments General comments (skin integrity, edema, etc.): SpO2 grossly >90%, dorpping to 88% briefly with pt able to recover >90% with cues for breathing techniques      Pertinent Vitals/Pain Pain Assessment Pain Assessment: No/denies pain    Home Living                          Prior Function            PT Goals (current goals can now be found in the care plan section) Acute Rehab PT Goals PT Goal Formulation: With patient/family Time For Goal Achievement: 06/18/22 Progress towards PT goals: Progressing toward goals    Frequency    Min  4X/week      PT Plan Current plan remains appropriate    Co-evaluation              AM-PAC PT "6 Clicks" Mobility   Outcome Measure  Help needed turning from your back to your side while in a flat bed without using bedrails?: A Little Help needed moving from lying on your back to sitting on the side of a flat bed without using bedrails?: A Little Help needed moving to and from a bed to a chair (including a wheelchair)?: A Lot Help needed standing up from a chair using your arms (e.g., wheelchair or bedside chair)?: A Little Help needed to walk in hospital room?: A Lot Help needed climbing 3-5 steps with a railing? : Total 6 Click Score: 14    End of Session Equipment Utilized During Treatment: Gait belt Activity Tolerance: Patient tolerated treatment well Patient left: in bed;with call bell/phone within reach;with bed alarm set;with family/visitor present;Other (comment) (with bed placed in chair position) Nurse Communication: Mobility status PT Visit Diagnosis: Unsteadiness on feet (R26.81);Muscle weakness (generalized) (M62.81);Difficulty in walking, not elsewhere classified (R26.2);Pain     Time: 1610-9604 PT Time Calculation (min) (ACUTE ONLY): 28 min  Charges:  $Gait Training: 8-22 mins  Lenora Boys. PTA Acute Rehabilitation Services Office: [phone]   Catalina Antigua 06/07/2022, 12:52 PM

## 2022-06-07 NOTE — Progress Notes (Signed)
Inpatient Rehab Admissions Coordinator:    I spoke with pt regarding CIR. She does not want to come, wants to go home. I also do not think insurance will approve CIR. I will sign off.   Megan Salon, MS, CCC-SLP Rehab Admissions Coordinator  [phone] (celll) [phone] (office)

## 2022-06-07 NOTE — Care Management Important Message (Signed)
Important Message  Patient Details  Name: Alexyss Bourque MRN: [id number] Date of Birth: 1950-05-11   Medicare Important Message Given:  Yes     Aj Crunkleton Stefan Church 06/07/2022, 3:37 PM

## 2022-06-07 NOTE — Progress Notes (Signed)
Bilateral lower extremity venous study completed.   Preliminary results relayed to RN.  Please see CV Procedures for preliminary results.  Cylee Dattilo, RVT  10:34 AM 06/07/22

## 2022-06-07 NOTE — Progress Notes (Signed)
Occupational Therapy Treatment Patient Details Name: Allison Hughes MRN: [id number] DOB: [birth year] Today's Date: 06/07/2022   History of present illness Pt is 72 yo female who presents on 06/04/22 with aphasia, word finding difficulties and word salad per significant other.  MRI neg for acute changes.  PMHx significant for DMII, HTN and DLD, CVA R parietal, R corona radiata, and L cerebellar regions   OT comments  Pt supine in bed with HOB elevated and boyfriend present upon OT arrival. Pt agreeable to participation in skilled OT session. Pt continues to present with poor safety awareness, poor insight into deficits, need for max cues to initiate and sustain attention to tasks, extra time for processing and motor planning, generalized B UE weakness, decreased activity tolerance, and decreased safety and independence with ADLs and functional mobility/transfers. However, pt is making progress toward OT goals. Pt currently demonstrates ability to complete UB ADLs with Set up to Min assist with cues and extra time, LB ADLs with Min to Mod-Max assist with cues and extra time, and functional mobility with RW or hand held assist +1 with Min guard to Min assist with cues and extra times. Pt will benefit from continued acute skilled OT services to address deficits and increase safety and independence with ADLs and functional transfers/mobility. Post acute discharge recommendations updated. Pt will benefit from intensive inpatient skilled OT services > 3 hours per day to maximize rehab potential. However, pt's boyfriend states he wants to take pt home with HHOT/PT services. OT educated boyfriend in pt need for 24 hour supervision/assistance secondary to pt's current poor safety awareness and poor insight into deficits. [name] boyfriend reports understanding and states he can ensure 24 hour supervision/assistance for a "few days" but is uncertain he can arrange for longer term 24 hour supervision/assistance.     Recommendations for follow up therapy are one component of a multi-disciplinary discharge planning process, led by the attending physician.  Recommendations may be updated based on patient status, additional functional criteria and insurance authorization.    Assistance Recommended at Discharge Other (comment) (24 hour supervision/assistance)  Patient can return home with the following  A little help with walking and/or transfers;Assistance with cooking/housework;Assistance with feeding;Direct supervision/assist for medications management;Direct supervision/assist for financial management;Assist for transportation;Help with stairs or ramp for entrance;Other (comment);A lot of help with bathing/dressing/bathroom (24 hour supervision/assistance for safety)   Equipment Recommendations  None recommended by OT    Recommendations for Other Services Rehab consult    Precautions / Restrictions Precautions Precautions: Fall Restrictions Weight Bearing Restrictions: No       Mobility Bed Mobility Overal bed mobility: Needs Assistance Bed Mobility: Supine to Sit, Sit to Supine     Supine to sit: Min guard, HOB elevated (with cues and extra time for processing and motor planning) Sit to supine: Supervision (with extra time and cues for technique)   General bed mobility comments: Pt requires max assist +2 to scoot up in bed    Transfers Overall transfer level: Needs assistance Equipment used: 1 person hand held assist, Rolling walker (2 wheels) (and use of gait belt) Transfers: Sit to/from Stand, Bed to chair/wheelchair/BSC Sit to Stand: Min guard, Min assist (with cues and extra time)           General transfer comment: Pt requires cues and extra time for processing and motor planning.     Balance Overall balance assessment: Needs assistance Sitting-balance support: Feet supported, No upper extremity supported Sitting balance-Leahy Scale: Fair Sitting balance - Comments: Pt  requires cues and extra time to bring feet to floor sitting EOB. Postural control: Posterior lean (mild, able to self correct with cues) Standing balance support: Single extremity supported, Bilateral upper extremity supported, During functional activity (Reliant on AD or hand held assist for balance)                               ADL either performed or assessed with clinical judgement   ADL Overall ADL's : Needs assistance/impaired Eating/Feeding: Set up;Cueing for sequencing;Sitting (cues for initiation)   Grooming: Min guard;Cueing for sequencing;Standing (cues for initiation; cues for standing upright/not leaning over on sink with pt demonstrating ability to self correct; with extra time; use of gait belt)   Upper Body Bathing: Minimal assistance;Sitting;Cueing for sequencing (cues for initiation and sustained attention to task; with extra time)   Lower Body Bathing: Cueing for safety;Cueing for sequencing;Sit to/from stand;Maximal assistance;Moderate assistance (cues for initiation and sustained attention to task)   Upper Body Dressing : Minimal assistance;Cueing for sequencing;Sitting (cues for initiation)   Lower Body Dressing: Minimal assistance;Cueing for safety;Cueing for sequencing;Sit to/from stand (cues for initiaiton and sustained attention to taks; with extra time)   Toilet Transfer: Min guard;Minimal assistance;Cueing for safety;Cueing for sequencing;Ambulation;BSC/3in1;Rolling walker (2 wheels) (hand held assist +1 and use of gait belt)   Toileting- Clothing Manipulation and Hygiene: Moderate assistance;Maximal assistance;Cueing for safety;Cueing for sequencing;Sit to/from stand (cues for initiation; with extra time)       Functional mobility during ADLs: Min guard;Minimal assistance;Cueing for safety;Cueing for sequencing;Rolling walker (2 wheels) (hand held assist +1 and use of gait belt) General ADL Comments: Pt performed funcitonal mobility in room this  session both with a RW (2 wheel) and hand held assist +1. Pt with poor safety awareness and need for cues with both attempts. Pt with slightly increased safety with hand held assist +1 secondary to pt not being familiar with use of RW. Gait belt used in both attempts.    Extremity/Trunk Assessment Upper Extremity Assessment Upper Extremity Assessment: Generalized weakness            Vision       Perception     Praxis      Cognition Arousal/Alertness: Awake/alert Behavior During Therapy: Flat affect, Impulsive Overall Cognitive Status: Impaired/Different from baseline Area of Impairment: Attention, Memory, Following commands, Safety/judgement, Awareness, Problem solving                   Current Attention Level: Focused Memory: Decreased recall of precautions, Decreased short-term memory Following Commands: Follows one step commands with increased time, Follows one step commands inconsistently Safety/Judgement: Decreased awareness of safety, Decreased awareness of deficits Awareness: Intellectual Problem Solving: Slow processing, Decreased initiation, Difficulty sequencing, Requires verbal cues General Comments: Pt requires increased time for processing and benefits from verbal and visual cues.        Exercises      Shoulder Instructions       General Comments O2 sat dropped to 88% on RA with ambulation with pt recovering quickly to >92% on RA with rest. Botfriend present throughout session.    Pertinent Vitals/ Pain       Pain Assessment Pain Assessment: No/denies pain  Home Living  Prior Functioning/Environment              Frequency  Min 2X/week        Progress Toward Goals  OT Goals(current goals can now be found in the care plan section)  Progress towards OT goals: Progressing toward goals  Acute Rehab OT Goals Patient Stated Goal: to return home  Plan Discharge plan needs to be  updated;Frequency remains appropriate    Co-evaluation                 AM-PAC OT "6 Clicks" Daily Activity     Outcome Measure   Help from another person eating meals?: A Little Help from another person taking care of personal grooming?: A Little Help from another person toileting, which includes using toliet, bedpan, or urinal?: A Lot Help from another person bathing (including washing, rinsing, drying)?: A Lot Help from another person to put on and taking off regular upper body clothing?: A Little Help from another person to put on and taking off regular lower body clothing?: A Little 6 Click Score: 16    End of Session Equipment Utilized During Treatment: Gait belt;Rolling walker (2 wheels)  OT Visit Diagnosis: Unsteadiness on feet (R26.81);Other abnormalities of gait and mobility (R26.89);Muscle weakness (generalized) (M62.81);Apraxia (R48.2);Ataxia, unspecified (R27.0);Other (comment) (decreased activity tolerance)   Activity Tolerance Patient tolerated treatment well   Patient Left in bed;with call bell/phone within reach;with bed alarm set;with family/visitor present   Nurse Communication          Time: 0981-1914 OT Time Calculation (min): 24 min  Charges: OT General Charges $OT Visit: 1 Visit OT Treatments $Self Care/Home Management : 23-37 mins  Markel Kurtenbach "Orson Eva., OTR/L, MA Acute Rehab [phone]   Lendon Colonel 06/07/2022, 12:05 PM

## 2022-06-07 NOTE — Progress Notes (Signed)
Brief note Events of last night noted She is completely awake/alert this morning On minimal amount of oxygen-lungs are clear on exam  Long discussion with significant other/patient again this morning-they are requesting discharge-significant other claims that yesterday-patient was just sleepy and tired-that is why she did not do well with therapy.  He feels very comfortable in taking her home-patient also wants to go home-and does not want any SNF or CIR.     Will update discharge summary that was done yesterday.  As noted yesterday-patient will go home on home O2, significant other will provide 24/7 supervision.

## 2022-06-28 NOTE — Progress Notes (Signed)
Carelink Summary Report / Loop Recorder 

## 2022-07-04 ENCOUNTER — Ambulatory Visit (INDEPENDENT_AMBULATORY_CARE_PROVIDER_SITE_OTHER): Payer: 59

## 2022-07-04 DIAGNOSIS — I63412 Cerebral infarction due to embolism of left middle cerebral artery: Secondary | ICD-10-CM | POA: Diagnosis not present

## 2022-07-04 LAB — CUP PACEART REMOTE DEVICE CHECK
Date Time Interrogation Session: 20240619230340
Implantable Pulse Generator Implant Date: 20220826

## 2022-07-24 NOTE — Progress Notes (Signed)
Carelink Summary Report / Loop Recorder 

## 2022-08-06 ENCOUNTER — Ambulatory Visit: Payer: 59

## 2022-08-06 DIAGNOSIS — I63412 Cerebral infarction due to embolism of left middle cerebral artery: Secondary | ICD-10-CM | POA: Diagnosis not present

## 2022-08-07 LAB — CUP PACEART REMOTE DEVICE CHECK
Date Time Interrogation Session: 20240722230618
Implantable Pulse Generator Implant Date: 20220826

## 2022-08-20 NOTE — Progress Notes (Signed)
Carelink Summary Report / Loop Recorder 

## 2022-09-02 ENCOUNTER — Observation Stay (HOSPITAL_COMMUNITY)
Admission: EM | Admit: 2022-09-02 | Discharge: 2022-09-03 | Disposition: A | Discharge status: Home / Self Care | Payer: 59 | Attending: Emergency Medicine | Admitting: Emergency Medicine

## 2022-09-02 ENCOUNTER — Encounter (HOSPITAL_COMMUNITY): Payer: Self-pay

## 2022-09-02 ENCOUNTER — Other Ambulatory Visit: Payer: Self-pay

## 2022-09-02 DIAGNOSIS — I10 Essential (primary) hypertension: Secondary | ICD-10-CM | POA: Diagnosis not present

## 2022-09-02 DIAGNOSIS — E119 Type 2 diabetes mellitus without complications: Secondary | ICD-10-CM

## 2022-09-02 DIAGNOSIS — Z794 Long term (current) use of insulin: Secondary | ICD-10-CM | POA: Diagnosis not present

## 2022-09-02 DIAGNOSIS — D649 Anemia, unspecified: Secondary | ICD-10-CM | POA: Diagnosis not present

## 2022-09-02 DIAGNOSIS — F17210 Nicotine dependence, cigarettes, uncomplicated: Secondary | ICD-10-CM | POA: Insufficient documentation

## 2022-09-02 DIAGNOSIS — Z7984 Long term (current) use of oral hypoglycemic drugs: Secondary | ICD-10-CM | POA: Insufficient documentation

## 2022-09-02 DIAGNOSIS — Z8673 Personal history of transient ischemic attack (TIA), and cerebral infarction without residual deficits: Secondary | ICD-10-CM

## 2022-09-02 DIAGNOSIS — R7989 Other specified abnormal findings of blood chemistry: Secondary | ICD-10-CM | POA: Diagnosis present

## 2022-09-02 DIAGNOSIS — Z79899 Other long term (current) drug therapy: Secondary | ICD-10-CM | POA: Diagnosis not present

## 2022-09-02 HISTORY — DX: Cerebral infarction, unspecified: I63.9

## 2022-09-02 LAB — CBC WITH DIFFERENTIAL/PLATELET
Abs Immature Granulocytes: 0.08 10*3/uL — ABNORMAL HIGH (ref 0.00–0.07)
Basophils Absolute: 0.1 10*3/uL (ref 0.0–0.1)
Basophils Relative: 1 %
Eosinophils Absolute: 0.4 10*3/uL (ref 0.0–0.5)
Eosinophils Relative: 3 %
HCT: 22.1 % — ABNORMAL LOW (ref 36.0–46.0)
Hemoglobin: 5.8 g/dL — CL (ref 12.0–15.0)
Immature Granulocytes: 1 %
Lymphocytes Relative: 28 %
Lymphs Abs: 3.5 10*3/uL (ref 0.7–4.0)
MCH: 18.9 pg — ABNORMAL LOW (ref 26.0–34.0)
MCHC: 26.2 g/dL — ABNORMAL LOW (ref 30.0–36.0)
MCV: 72 fL — ABNORMAL LOW (ref 80.0–100.0)
Monocytes Absolute: 0.9 10*3/uL (ref 0.1–1.0)
Monocytes Relative: 7 %
Neutro Abs: 7.5 10*3/uL (ref 1.7–7.7)
Neutrophils Relative %: 60 %
Platelets: 514 10*3/uL — ABNORMAL HIGH (ref 150–400)
RBC: 3.07 MIL/uL — ABNORMAL LOW (ref 3.87–5.11)
RDW: 16.1 % — ABNORMAL HIGH (ref 11.5–15.5)
WBC: 12.4 10*3/uL — ABNORMAL HIGH (ref 4.0–10.5)
nRBC: 0 /100 WBC
nRBC: 0.2 % (ref 0.0–0.2)

## 2022-09-02 LAB — RETICULOCYTES
Immature Retic Fract: 20.5 % — ABNORMAL HIGH (ref 2.3–15.9)
RBC.: 3.05 MIL/uL — ABNORMAL LOW (ref 3.87–5.11)
Retic Count, Absolute: 101 10*3/uL (ref 19.0–186.0)
Retic Ct Pct: 3.3 % — ABNORMAL HIGH (ref 0.4–3.1)

## 2022-09-02 LAB — TECHNOLOGIST SMEAR REVIEW

## 2022-09-02 LAB — POC OCCULT BLOOD, ED: Fecal Occult Bld: NEGATIVE

## 2022-09-02 LAB — COMPREHENSIVE METABOLIC PANEL
ALT: 15 U/L (ref 0–44)
AST: 20 U/L (ref 15–41)
Albumin: 2.9 g/dL — ABNORMAL LOW (ref 3.5–5.0)
Alkaline Phosphatase: 100 U/L (ref 38–126)
Anion gap: 10 (ref 5–15)
BUN: 14 mg/dL (ref 8–23)
CO2: 23 mmol/L (ref 22–32)
Calcium: 9.3 mg/dL (ref 8.9–10.3)
Chloride: 105 mmol/L (ref 98–111)
Creatinine, Ser: 0.7 mg/dL (ref 0.44–1.00)
GFR, Estimated: >60 mL/min (ref >60)
Glucose, Bld: 159 mg/dL — ABNORMAL HIGH (ref 70–99)
Potassium: 3.8 mmol/L (ref 3.5–5.1)
Sodium: 138 mmol/L (ref 135–145)
Total Bilirubin: 0.3 mg/dL (ref 0.3–1.2)
Total Protein: 6.5 g/dL (ref 6.5–8.1)

## 2022-09-02 LAB — CBC
HCT: 22.3 % — ABNORMAL LOW (ref 36.0–46.0)
Hemoglobin: 5.8 g/dL — CL (ref 12.0–15.0)
MCH: 19.3 pg — ABNORMAL LOW (ref 26.0–34.0)
MCHC: 26 g/dL — ABNORMAL LOW (ref 30.0–36.0)
MCV: 74.1 fL — ABNORMAL LOW (ref 80.0–100.0)
Platelets: 519 10*3/uL — ABNORMAL HIGH (ref 150–400)
RBC: 3.01 MIL/uL — ABNORMAL LOW (ref 3.87–5.11)
RDW: 16.2 % — ABNORMAL HIGH (ref 11.5–15.5)
WBC: 12.6 10*3/uL — ABNORMAL HIGH (ref 4.0–10.5)
nRBC: 0 % (ref 0.0–0.2)

## 2022-09-02 LAB — VITAMIN B12: Vitamin B-12: 2481 pg/mL — ABNORMAL HIGH (ref 180–914)

## 2022-09-02 LAB — FERRITIN: Ferritin: 2 ng/mL — ABNORMAL LOW (ref 11–307)

## 2022-09-02 LAB — IRON AND TIBC
Iron: 7 ug/dL — ABNORMAL LOW (ref 28–170)
Saturation Ratios: 2 % — ABNORMAL LOW (ref 10.4–31.8)
TIBC: 447 ug/dL (ref 250–450)
UIBC: 440 ug/dL

## 2022-09-02 LAB — ABO/RH: ABO/RH(D): O POS

## 2022-09-02 LAB — PREPARE RBC (CROSSMATCH)

## 2022-09-02 LAB — GLUCOSE, CAPILLARY: Glucose-Capillary: 168 mg/dL — ABNORMAL HIGH (ref 70–99)

## 2022-09-02 LAB — FOLATE: Folate: 31.1 ng/mL (ref >5.9)

## 2022-09-02 MED ORDER — ACETAMINOPHEN 325 MG PO TABS: 650.0000 mg | ORAL_TABLET | Freq: Four times a day (QID) | ORAL | Status: DC | PRN

## 2022-09-02 MED ORDER — ATORVASTATIN CALCIUM 40 MG PO TABS
40.0000 mg | ORAL_TABLET | Freq: Every day | ORAL | Status: DC
  Administered 2022-09-03: 40 mg via ORAL
  Filled 2022-09-02: qty 1

## 2022-09-02 MED ORDER — INSULIN ASPART 100 UNIT/ML IJ SOLN
0.0000 [IU] | Freq: Three times a day (TID) | INTRAMUSCULAR | Status: DC
  Administered 2022-09-03: 2 [IU] via SUBCUTANEOUS

## 2022-09-02 MED ORDER — MOMETASONE FURO-FORMOTEROL FUM 200-5 MCG/ACT IN AERO
2.0000 | INHALATION_SPRAY | Freq: Two times a day (BID) | RESPIRATORY_TRACT | Status: DC
Start: 2022-09-02 — End: 2022-09-02

## 2022-09-02 MED ORDER — ALBUTEROL SULFATE (2.5 MG/3ML) 0.083% IN NEBU: 2.5000 mg | INHALATION_SOLUTION | Freq: Four times a day (QID) | RESPIRATORY_TRACT | Status: DC | PRN

## 2022-09-02 MED ORDER — NICOTINE 21 MG/24HR TD PT24
21.0000 mg | MEDICATED_PATCH | Freq: Every day | TRANSDERMAL | Status: DC
  Administered 2022-09-02 – 2022-09-03 (×2): 21 mg via TRANSDERMAL
  Filled 2022-09-02 (×2): qty 1

## 2022-09-02 MED ORDER — ACETAMINOPHEN 650 MG RE SUPP: 650.0000 mg | Freq: Four times a day (QID) | RECTAL | Status: DC | PRN

## 2022-09-02 MED ORDER — UMECLIDINIUM BROMIDE 62.5 MCG/ACT IN AEPB: 1.0000 | INHALATION_SPRAY | Freq: Every day | RESPIRATORY_TRACT | Status: DC

## 2022-09-02 MED ORDER — POLYETHYLENE GLYCOL 3350 17 G PO PACK: 17.0000 g | PACK | Freq: Every day | ORAL | Status: DC | PRN

## 2022-09-02 MED ORDER — SODIUM CHLORIDE 0.9% IV SOLUTION: Freq: Once | INTRAVENOUS | Status: AC

## 2022-09-02 MED ORDER — SODIUM CHLORIDE 0.9% FLUSH
3.0000 mL | Freq: Two times a day (BID) | INTRAVENOUS | Status: DC
  Administered 2022-09-02 (×2): 3 mL via INTRAVENOUS

## 2022-09-02 MED ORDER — INSULIN GLARGINE-YFGN 100 UNIT/ML ~~LOC~~ SOLN
8.0000 [IU] | Freq: Every day | SUBCUTANEOUS | Status: DC
  Administered 2022-09-03: 8 [IU] via SUBCUTANEOUS
  Filled 2022-09-02: qty 0.08

## 2022-09-02 NOTE — H&P (Signed)
History and Physical   Allison Hughes WJX:[id number] DOB: 21-Feb-1950 DOA: 09/02/2022  PCP: Ralene Ok, MD   Patient coming from: Home/PCP office  Chief Complaint: Abnormal lab/fatigue  HPI: Allison Hughes is a 72 y.o. female with medical history significant of diabetes, hypertension, stroke, anxiety, hyperlipidemia, obesity presenting with abnormal lab from PCP office after evaluation for fatigue.  Patient states she has had several weeks of fatigue and has got to the point where she has to sit after getting up to go to the fridge due to feeling lightheaded and short of breath.  Saw her PCP 3 days ago and after labs resulted today patient was instructed to go to the ED for further evaluation due to hemoglobin of 5.8.  Patient is on Plavix but denies any dark or bloody stools or other source of bleeding.  Further denies fevers, chills, chest pain, abdominal pain, constipation, diarrhea, nausea, vomiting.  ED Course: Vital signs in the ED stable.  Lab workup included CMP with glucose 159, albumin 2.9.  CBC with hemoglobin of 5.8 down from 11.22 months ago.  Normocytic with MCV of 74.  Leukocytosis to 12.6, platelets 519.  FOBT negative.  Type and screen in the ED.  No imaging in the ED.  B12 normal, folate pending.  Iron 7, ferritin 2, reticulocytes pending.  2 units ordered for transfusion in the ED.  Review of Systems: As per HPI otherwise all other systems reviewed and are negative.  Past Medical History:  Diagnosis Date   Acute pyelonephritis 02/02/2016   Diabetes mellitus 01/15/2004   T2DM   Hyperlipidemia    Hypertension    Obesity (BMI 30-39.9)    Pneumonia 09/06/2020   Sepsis (HCC) 02/02/2016   Stroke [facility])     Past Surgical History:  Procedure Laterality Date   CESAREAN SECTION     LOOP RECORDER INSERTION N/A 09/08/2020   Procedure: LOOP RECORDER INSERTION;  Surgeon: Regan Lemming, MD;  Location: [facility] INVASIVE CV LAB;  Service: Cardiovascular;  Laterality: N/A;    ORIF ANKLE FRACTURE  05/11/2011   Procedure: OPEN REDUCTION INTERNAL FIXATION (ORIF) ANKLE FRACTURE;  Surgeon: Nadara Mustard, MD;  Location: [facility] OR;  Service: Orthopedics;  Laterality: Right;   TUBAL LIGATION      Social History  reports that she has been smoking cigarettes. She has a 40 pack-year smoking history. She quit smokeless tobacco use about 11 years ago. She reports that she does not drink alcohol and does not use drugs.  Allergies  Allergen Reactions   Iodine Swelling   Shrimp [Shellfish Allergy] Swelling    Fresh shrimp    Family History  Problem Relation Age of Onset   Drug abuse Daughter    Diabetes Daughter   Reviewed on admission  Prior to Admission medications   Medication Sig Start Date End Date Taking? Authorizing Provider  ACCU-CHEK GUIDE test strip 1 each by Other route 2 (two) times daily. 05/29/22   [provider]  Accu-Chek Softclix Lancets lancets 1 each by Other route 2 (two) times daily. 04/12/22   [provider]  albuterol (VENTOLIN HFA) 108 (90 Base) MCG/ACT inhaler Inhale 2 puffs into the lungs every 6 (six) hours as needed for wheezing or shortness of breath. 06/06/22   Ghimire, Werner Lean, MD  ALPRAZolam Prudy Feeler) 1 MG tablet Take 0.5-1 mg by mouth 2 (two) times daily as needed. For anxiety and sleep.    [provider]  atorvastatin (LIPITOR) 40 MG tablet Take 40 mg by mouth  daily. 08/06/18   [provider]  clopidogrel (PLAVIX) 75 MG tablet Take 1 tablet (75 mg total) by mouth daily. 06/06/22   Ghimire, Werner Lean, MD  co-enzyme Q-10 30 MG capsule Take 30 mg by mouth 3 (three) times daily.    [provider]  fluticasone-salmeterol (ADVAIR) 250-50 MCG/ACT AEPB Inhale 1 puff into the lungs in the morning and at bedtime. 06/06/22 06/06/23  Ghimire, Werner Lean, MD  lisinopril (PRINIVIL,ZESTRIL) 20 MG tablet Take 20 mg by mouth daily.    [provider]  metFORMIN (GLUCOPHAGE-XR) 500 MG 24 hr tablet Take 500 mg  by mouth daily. 10/03/20   [provider]  Multiple Vitamin (MULTIVITAMIN ADULT PO) Take by mouth.    [provider]  MYRBETRIQ 50 MG TB24 tablet Take 50 mg by mouth daily. 08/31/18   [provider]  nicotine (NICODERM CQ - DOSED IN MG/24 HOURS) 14 mg/24hr patch Place 1 patch (14 mg total) onto the skin daily. 09/09/20   Pokhrel, Rebekah Chesterfield, MD  Omega-3 Fatty Acids (OMEGA 3 500 PO) Take 1 capsule by mouth daily. 1000 mg    [provider]  OZEMPIC, 1 MG/DOSE, 4 MG/3ML SOPN Inject 1 mg into the skin once a week. 05/21/22   [provider]  TRESIBA FLEXTOUCH 100 UNIT/ML FlexTouch Pen Inject 12 Units into the skin daily. 06/06/22   Ghimire, Werner Lean, MD  triamcinolone cream (KENALOG) 0.1 % Apply 1 application topically 3 (three) times daily. 09/01/20   [provider]  umeclidinium bromide (INCRUSE ELLIPTA) 62.5 MCG/ACT AEPB Inhale 1 puff into the lungs daily. 06/06/22 06/06/23  Ghimire, Werner Lean, MD  vitamin B-12 (CYANOCOBALAMIN) 1000 MCG tablet Take 1,000 mcg by mouth daily.      [provider]  VITAMIN D PO Take by mouth.    [provider]    Physical Exam: Vitals:   09/02/22 4098 09/02/22 0924 09/02/22 0933  BP: 136/69 134/69   Pulse: 93 88   Resp: 18 18   Temp: 97.8 F (36.6 C) 97.8 F (36.6 C)   SpO2: 99% 99%   Weight:   82.2 kg  Height:   5\' 2"  (1.575 m)    Physical Exam Constitutional:      General: She is not in acute distress.    Appearance: Normal appearance.  HENT:     Head: Normocephalic and atraumatic.     Mouth/Throat:     Mouth: Mucous membranes are moist.     Pharynx: Oropharynx is clear.  Eyes:     Extraocular Movements: Extraocular movements intact.     Pupils: Pupils are equal, round, and reactive to light.  Cardiovascular:     Rate and Rhythm: Normal rate and regular rhythm.     Pulses: Normal pulses.     Heart sounds: Normal heart sounds.  Pulmonary:     Effort: Pulmonary effort is normal.  No respiratory distress.     Breath sounds: Normal breath sounds.  Abdominal:     General: Bowel sounds are normal. There is no distension.     Palpations: Abdomen is soft.     Tenderness: There is no abdominal tenderness.  Musculoskeletal:        General: No swelling or deformity.  Skin:    General: Skin is warm and dry.     Coloration: Skin is pale.  Neurological:     General: No focal deficit present.     Mental Status: Mental status is at baseline.  Labs on Admission: I have personally reviewed following labs and imaging studies  CBC: Recent Labs  Lab 09/02/22 0944  WBC 12.6*  HGB 5.8*  HCT 22.3*  MCV 74.1*  PLT 519*    Basic Metabolic Panel: Recent Labs  Lab 09/02/22 0944  NA 138  K 3.8  CL 105  CO2 23  GLUCOSE 159*  BUN 14  CREATININE 0.70  CALCIUM 9.3    GFR: Estimated Creatinine Clearance: 64 mL/min (by C-G formula based on SCr of 0.7 mg/dL).  Liver Function Tests: Recent Labs  Lab 09/02/22 0944  AST 20  ALT 15  ALKPHOS 100  BILITOT 0.3  PROT 6.5  ALBUMIN 2.9*    Urine analysis:    Component Value Date/Time   COLORURINE STRAW (A) 06/04/2022 0954   APPEARANCEUR CLEAR 06/04/2022 0954   LABSPEC 1.005 06/04/2022 0954   PHURINE 6.0 06/04/2022 0954   GLUCOSEU NEGATIVE 06/04/2022 0954   HGBUR NEGATIVE 06/04/2022 0954   BILIRUBINUR NEGATIVE 06/04/2022 0954   KETONESUR NEGATIVE 06/04/2022 0954   PROTEINUR NEGATIVE 06/04/2022 0954   NITRITE NEGATIVE 06/04/2022 0954   LEUKOCYTESUR LARGE (A) 06/04/2022 0954    Radiological Exams on Admission: No results found.  EKG: Independently reviewed.  Sinus rhythm at 81 bpm.  Low voltage multiple leads.  Nonspecific T wave flattening.  Assessment/Plan Principal Problem:   Symptomatic anemia Active Problems:   Essential hypertension   Type 2 diabetes mellitus without complication, without long-term current use of insulin (HCC)   History of CVA (cerebrovascular accident)   Symptomatic anemia >  Presented with ongoing fatigue lightheadedness shortness of breath on exertion.  Seen at PCP office on Friday labs came back today/Monday showing hemoglobin of 5.8 and patient sent to the ED. > Hemoglobin remained stable at 5.8.  Normocytic with MCV of 74.  Down from 11.22 months ago.  Iron 7, ferritin 2.  Normal B12, folate, reticulocytes pending.  > Type and screen in the ED.  2 units ordered for transfusion. > Appears to be primarily iron deficiency anemia.  FOBT is negative and no reports of any bleeding including no dark or bloody stools.  Low suspicion for hemolysis given normal bilirubin but will check smear. - Monitor on progressive unit - Continue with 2 units transfusion - Follow-up folate, reticulocyte count - Add on peripheral smear - Trend hemoglobin - Will likely benefit from IV iron infusion while admitted  Leukocytosis Thrombocytosis > Leukocytosis to 12.6 and thrombocytosis of 519 in ED.  Likely reactive in the setting of degree of anemia as above. > No infectious symptoms. - Continue to trend CBC  Diabetes > 12 units daily at home - 8 units daily - SSI  Hypertension - Holding home lisinopril for now   Hyperlipidemia History of CVA - Continue home atorvastatin - Holding home Plavix for now  Anxiety - Continue home Xanax  Lung disease > No PFTs yet. > Concern for possible undiagnosed obstructive lung disease during admission for hypoxia earlier this year. - Prescribed inhalers after recent admission but has not been taking them - No longer using home oxygen, saturation have been okay here thus far - Continue home as needed albuterol  DVT prophylaxis: SCDs Code Status:   Full Family Communication:  None on admission  Disposition Plan:   Patient is from:  Home  Anticipated DC to:  Home  Anticipated DC date:  1 to 3 days  Anticipated DC barriers: None  Consults called:  None Admission status:  Observation, progressive  Severity  of Illness: The  appropriate patient status for this patient is OBSERVATION. Observation status is judged to be reasonable and necessary in order to provide the required intensity of service to ensure the patient's safety. The patient's presenting symptoms, physical exam findings, and initial radiographic and laboratory data in the context of their medical condition is felt to place them at decreased risk for further clinical deterioration. Furthermore, it is anticipated that the patient will be medically stable for discharge from the hospital within 2 midnights of admission.    Synetta Fail MD [facility]  How to contact the [facility] Attending or Consulting provider 7A - 7P or covering provider during after hours 7P -7A, for this patient?   Check the care team in [facility] and look for a) attending/consulting [facility] provider listed and b) the [facility] team listed Log into [email or web address] and use [place]'s universal password to access. If you do not have the password, please contact the hospital operator. Locate the [facility], The provider you are looking for under [facility] and page to a number that you can be directly reached. If you still have difficulty reaching the provider, please page the [facility] (Director on Call) for the Hospitalists listed on amion for assistance.  09/02/2022, 2:01 PM

## 2022-09-02 NOTE — ED Provider Notes (Signed)
[place] EMERGENCY DEPARTMENT AT [facility] Provider Note   CSN: [id number] Arrival date & time: 09/02/22  4098     History  Chief Complaint  Patient presents with   Abnormal Lab    Allison Hughes is a 72 y.o. female with a history of CVA, type 2 diabetes, and hypertension who presents to the ED today after an abnormal lab with her PCP.  Patient reports that she has been feeling tired for the past several weeks.  She reports she has to sit down after going to the refrigerator because it makes her feel both lightheaded and short of breath. Patient was evaluated by her PCP 3 days ago and she was called this morning and told to come to the ED since her hemoglobin was 5.8. She currently takes Plavix but denies any other antiplatelet or anticoagulant use. Denies bright red or dark tarry stools.    Home Medications Prior to Admission medications   Medication Sig Start Date End Date Taking? Authorizing Provider  ACCU-CHEK GUIDE test strip 1 each by Other route 2 (two) times daily. 05/29/22   [provider]  Accu-Chek Softclix Lancets lancets 1 each by Other route 2 (two) times daily. 04/12/22   [provider]  albuterol (VENTOLIN HFA) 108 (90 Base) MCG/ACT inhaler Inhale 2 puffs into the lungs every 6 (six) hours as needed for wheezing or shortness of breath. 06/06/22   Ghimire, Werner Lean, MD  ALPRAZolam Prudy Feeler) 1 MG tablet Take 0.5-1 mg by mouth 2 (two) times daily as needed. For anxiety and sleep.    [provider]  atorvastatin (LIPITOR) 40 MG tablet Take 40 mg by mouth daily. 08/06/18   [provider]  clopidogrel (PLAVIX) 75 MG tablet Take 1 tablet (75 mg total) by mouth daily. 06/06/22   Ghimire, Werner Lean, MD  co-enzyme Q-10 30 MG capsule Take 30 mg by mouth 3 (three) times daily.    [provider]  fluticasone-salmeterol (ADVAIR) 250-50 MCG/ACT AEPB Inhale 1 puff into the lungs in the morning and at bedtime. 06/06/22 06/06/23   Ghimire, Werner Lean, MD  lisinopril (PRINIVIL,ZESTRIL) 20 MG tablet Take 20 mg by mouth daily.    [provider]  metFORMIN (GLUCOPHAGE-XR) 500 MG 24 hr tablet Take 500 mg by mouth daily. 10/03/20   [provider]  Multiple Vitamin (MULTIVITAMIN ADULT PO) Take by mouth.    [provider]  MYRBETRIQ 50 MG TB24 tablet Take 50 mg by mouth daily. 08/31/18   [provider]  nicotine (NICODERM CQ - DOSED IN MG/24 HOURS) 14 mg/24hr patch Place 1 patch (14 mg total) onto the skin daily. 09/09/20   Pokhrel, Rebekah [place], MD  Omega-3 Fatty Acids (OMEGA 3 500 PO) Take 1 capsule by mouth daily. 1000 mg    [provider]  OZEMPIC, 1 MG/DOSE, 4 MG/3ML SOPN Inject 1 mg into the skin once a week. 05/21/22   [provider]  TRESIBA FLEXTOUCH 100 UNIT/ML FlexTouch Pen Inject 12 Units into the skin daily. 06/06/22   Ghimire, Werner Lean, MD  triamcinolone cream (KENALOG) 0.1 % Apply 1 application topically 3 (three) times daily. 09/01/20   [provider]  umeclidinium bromide (INCRUSE ELLIPTA) 62.5 MCG/ACT AEPB Inhale 1 puff into the lungs daily. 06/06/22 06/06/23  Ghimire, Werner Lean, MD  vitamin B-12 (CYANOCOBALAMIN) 1000 MCG tablet Take 1,000 mcg by mouth daily.      [provider]  VITAMIN D PO Take by mouth.    [provider]      Allergies    Iodine and Shrimp [shellfish allergy]    Review of Systems   Review of Systems  Constitutional:        Abnormal lab value  All other systems reviewed and are negative.   Physical Exam Updated Vital Signs BP 134/69   Pulse 88   Temp 97.8 F (36.6 C)   Resp 18   Ht 5\' 2"  (1.575 m)   Wt 82.2 kg   SpO2 99%   BMI 33.15 kg/m  Physical Exam Vitals and nursing note reviewed. Exam conducted with a chaperone present ([place], [place]).  Constitutional:      General: She is not in acute distress.    Appearance: Normal appearance. She is not ill-appearing.  HENT:     Head: Normocephalic and  atraumatic.     Mouth/Throat:     Mouth: Mucous membranes are moist.  Eyes:     Conjunctiva/sclera: Conjunctivae normal.     Pupils: Pupils are equal, round, and reactive to light.  Cardiovascular:     Rate and Rhythm: Normal rate and regular rhythm.     Pulses: Normal pulses.  Pulmonary:     Effort: Pulmonary effort is normal.     Breath sounds: Normal breath sounds.  Abdominal:     Palpations: Abdomen is soft.     Tenderness: There is no abdominal tenderness.  Genitourinary:    Rectum: Guaiac result negative.  Musculoskeletal:     Right lower leg: No edema.     Left lower leg: No edema.  Skin:    General: Skin is warm and dry.     Findings: No rash.  Neurological:     General: No focal deficit present.     Mental Status: She is alert.     Sensory: No sensory deficit.     Motor: No weakness.  Psychiatric:        Mood and Affect: Mood normal.        Behavior: Behavior normal.     ED Results / Procedures / Treatments   Labs (all labs ordered are listed, but only abnormal results are displayed) Labs Reviewed  COMPREHENSIVE METABOLIC PANEL - Abnormal; Notable for the following components:      Result Value   Glucose, Bld 159 (*)    Albumin 2.9 (*)    All other components within normal limits  CBC - Abnormal; Notable for the following components:   WBC 12.6 (*)    RBC 3.01 (*)    Hemoglobin 5.8 (*)    HCT 22.3 (*)    MCV 74.1 (*)    MCH 19.3 (*)    MCHC 26.0 (*)    RDW 16.2 (*)    Platelets 519 (*)    All other components within normal limits  VITAMIN B12 - Abnormal; Notable for the following components:   Vitamin B-12 2,481 (*)    All other components within normal limits  IRON AND TIBC - Abnormal; Notable for the following components:   Iron 7 (*)    Saturation Ratios 2 (*)    All other components within normal limits  FERRITIN - Abnormal; Notable for the following components:   Ferritin 2 (*)    All other components within normal limits  FOLATE   RETICULOCYTES  TECHNOLOGIST SMEAR REVIEW  DIFFERENTIAL  POC OCCULT BLOOD, ED  TYPE AND SCREEN  ABO/RH  PREPARE RBC (CROSSMATCH)    EKG EKG Interpretation Date/Time:  Monday September 02 2022 11:32:17 EDT Ventricular Rate:  81 PR Interval:  122 QRS Duration:  77 QT Interval:  368 QTC Calculation: 428 R Axis:   43  Text Interpretation: Sinus rhythm Low voltage, precordial leads No significant change since last tracing Confirmed by Elayne Snare ([id number]) on 09/02/2022 11:53:04 AM  Radiology No results found.  Procedures Procedures: not indicated.   Medications Ordered in ED Medications  0.9 %  sodium chloride infusion (Manually program via Guardrails IV Fluids) (has no administration in time range)  atorvastatin (LIPITOR) tablet 40 mg (has no administration in time range)  mometasone-formoterol (DULERA) 200-5 MCG/ACT inhaler 2 puff (has no administration in time range)  umeclidinium bromide (INCRUSE ELLIPTA) 62.5 MCG/ACT 1 puff (has no administration in time range)  albuterol (PROVENTIL) (2.5 MG/3ML) 0.083% nebulizer solution 2.5 mg (has no administration in time range)  sodium chloride flush (NS) 0.9 % injection 3 mL (has no administration in time range)  acetaminophen (TYLENOL) tablet 650 mg (has no administration in time range)    Or  acetaminophen (TYLENOL) suppository 650 mg (has no administration in time range)  polyethylene glycol (MIRALAX / GLYCOLAX) packet 17 g (has no administration in time range)    ED Course/ Medical Decision Making/ A&P                                 Medical Decision Making Amount and/or Complexity of Data Reviewed Labs: ordered. ECG/medicine tests: ordered.  Risk Prescription drug management.   This patient presents to the ED for concern of hemoglobin of 5.8, this involves an extensive number of treatment options, and is a complaint that carries with it a high risk of complications and morbidity.   Differential diagnosis includes:  occult bleed, hemolytic anemia, etc.   Comorbidities  See HPI above   Additional History  Additional history obtained from patient's records and PCP notes.   Cardiac Monitoring / EKG  The patient was maintained on a cardiac monitor.  I personally viewed and interpreted the cardiac monitored which showed: sinus rhythm with a heart rate of 81 bpm.   Lab Tests  I ordered and personally interpreted labs.  The pertinent results include:   CMP showed albumin of 2.9 and glucose of 159 otherwise within normal limits - no acute electrolyte abnormality or AKI CBC shows hemoglobin of 5.8 Iron profile - iron level of 7 with ferritin levels and saturation ratios of 2, was within normal limits Vitamin B12 - 2,481 POC occult blood - negative Type and screen - O positive   Consultations  I requested consultation with Dr. Alinda Money, hospitalist,  and discussed lab and imaging findings as well as pertinent plan - they recommend: admit patient for further management of anemia.   Problem List / ED Course / Critical Interventions / Medication Management  Abnormal lab - hemoglobin of 5.8 Medications were ordered including: RBCs for transfusion  Patient is alert and in NAD in reevaluation. I have reviewed the patients home medicines and have made adjustments as needed.   Social Determinants of Health  Tobacco use   Test / Admission - Considered  Patient admitted to hospitalist service for further management of anemia. Patient is agreeable with this plan.       Final Clinical Impression(s) / ED Diagnoses Final diagnoses:  Symptomatic anemia    Rx / DC Orders ED Discharge Orders     None  Maxwell Marion, PA-C 09/02/22 1315    Theresia Lo [place] K, DO 09/02/22 1322

## 2022-09-02 NOTE — ED Triage Notes (Signed)
Pt sent by PCP for hemoglobin of 5.8. Pt c/o SOB and weaknessx59mo. Pt is pale

## 2022-09-02 NOTE — ED Provider Notes (Signed)
MSE note.  Patient has a history of diabetes and states that she has been feeling weak for a number of days.  Patient states she takes Plavix because of a stroke she had.  She also states she has not had any black or bloody stools.  She saw her doctor recently complaining of weakness and he called her and told her to come to the hospital because her hemoglobin was 5.8.  Physical exam patient in no acute distress vital signs normal.  Sclera pale.  Lungs clear heart regular rate and rhythm.  Abdomen negative.  Labs have been ordered.  If patient is truly anemic she will be transfused.   Bethann Berkshire, MD 09/02/22 [phone]

## 2022-09-02 NOTE — ED Notes (Signed)
Pt verbalized understanding of blood transfusion purpose and symptoms to report. Pt verbalized understanding.

## 2022-09-02 NOTE — ED Notes (Signed)
ED TO INPATIENT HANDOFF REPORT  ED Nurse Name and Phone #: Leeroy Bock [phone]  S Name/Age/Gender Allison Hughes 72 y.o. female Room/Bed: 035C/035C  Code Status   Code Status: Full Code  Home/SNF/Other Home Patient oriented to: self, place, time, and situation Is this baseline? Yes   Triage Complete: Triage complete  Chief Complaint Symptomatic anemia [D64.9]  Triage Note Pt sent by PCP for hemoglobin of 5.8. Pt c/o SOB and weaknessx80mo. Pt is pale    Allergies Allergies  Allergen Reactions   Iodine Swelling   Shrimp [Shellfish Allergy] Swelling    Fresh shrimp    Level of Care/Admitting Diagnosis ED Disposition     ED Disposition  Admit   Condition  --   Comment  Hospital Area: [facility] [[id number]]  Level of Care: Progressive [102]  Admit to Progressive based on following criteria: MULTISYSTEM THREATS such as stable sepsis, metabolic/electrolyte imbalance with or without encephalopathy that is responding to early treatment.  May place patient in observation at [facility] or [facility] if equivalent level of care is available:: No  Covid Evaluation: Asymptomatic - no recent exposure (last 10 days) testing not required  Diagnosis: Symptomatic anemia [8295621]  Admitting Physician: Synetta Fail [[id number]]  Attending Physician: Synetta Fail [[id number]]          B Medical/Surgery History Past Medical History:  Diagnosis Date   Acute pyelonephritis 02/02/2016   Diabetes mellitus 01/15/2004   T2DM   Hyperlipidemia    Hypertension    Obesity (BMI 30-39.9)    Pneumonia 09/06/2020   Sepsis (HCC) 02/02/2016   Stroke [facility])    Past Surgical History:  Procedure Laterality Date   CESAREAN SECTION     LOOP RECORDER INSERTION N/A 09/08/2020   Procedure: LOOP RECORDER INSERTION;  Surgeon: Regan Lemming, MD;  Location: [facility] INVASIVE CV LAB;  Service: Cardiovascular;  Laterality: N/A;   ORIF ANKLE FRACTURE  05/11/2011   Procedure: OPEN  REDUCTION INTERNAL FIXATION (ORIF) ANKLE FRACTURE;  Surgeon: Nadara Mustard, MD;  Location: [facility] OR;  Service: Orthopedics;  Laterality: Right;   TUBAL LIGATION       A IV Location/Drains/Wounds Patient Lines/Drains/Airways Status     Active Line/Drains/Airways     Name Placement date Placement time Site Days   Peripheral IV 09/02/22 18 G Left Antecubital 09/02/22  1123  Antecubital  less than 1   Peripheral IV 09/02/22 18 G Anterior;Right Forearm 09/02/22  1120  Forearm  less than 1            Intake/Output Last 24 hours  Intake/Output Summary (Last 24 hours) at 09/02/2022 1718 Last data filed at 09/02/2022 1643 Gross per 24 hour  Intake 658 ml  Output --  Net 658 ml    Labs/Imaging Results for orders placed or performed during the hospital encounter of 09/02/22 (from the past 48 hour(s))  Type and screen [facility]     Status: None (Preliminary result)   Collection Time: 09/02/22  9:40 AM  Result Value Ref Range   ABO/RH(D) O POS    Antibody Screen NEG    Sample Expiration 09/05/2022,2359    Unit Number [id number]    Blood Component Type RBC LR PHER2    Unit division 00    Status of Unit ISSUED    Transfusion Status OK TO TRANSFUSE    Crossmatch Result      Compatible Performed at [facility], [address], [place], [place] [address]  Unit Number [id number]    Blood Component Type RBC LR PHER2    Unit division 00    Status of Unit ISSUED    Transfusion Status OK TO TRANSFUSE    Crossmatch Result Compatible   Reticulocytes     Status: Abnormal   Collection Time: 09/02/22  9:40 AM  Result Value Ref Range   Retic Ct Pct 3.3 (H) 0.4 - 3.1 %   RBC. 3.05 (L) 3.87 - 5.11 MIL/uL   Retic Count, Absolute 101.0 19.0 - 186.0 K/uL   Immature Retic Fract 20.5 (H) 2.3 - 15.9 %    Comment: Performed at [facility], [address], [place], [place] [address]  Technologist smear review     Status: None   Collection Time: 09/02/22   9:40 AM  Result Value Ref Range   WBC MORPHOLOGY MORPHOLOGY UNREMARKABLE    RBC MORPHOLOGY MORPHOLOGY UNREMARKABLE    Plt Morphology MORPHOLOGY UNREMARKABLE    Clinical Information      Normocyctic symtpomatic anemia. Iron defficient but has had rapid Hgb drop from 11.2 to 5.8 in 2 months. No known bleeding, FOBT negative.    Comment: Performed at [facility], [address] [place], [place] [address]  CBC with Differential/Platelet     Status: Abnormal   Collection Time: 09/02/22  9:40 AM  Result Value Ref Range   WBC 12.4 (H) 4.0 - 10.5 K/uL   RBC 3.07 (L) 3.87 - 5.11 MIL/uL   Hemoglobin 5.8 (LL) 12.0 - 15.0 g/dL    Comment: REPEATED TO VERIFY CRITICAL RESULT CALLED TO, READ BACK BY AND VERIFIED WITH: C. Gasper Hopes, RN @ 13:56 08.19.24 JLASIGAN    HCT 22.1 (L) 36.0 - 46.0 %   MCV 72.0 (L) 80.0 - 100.0 fL   MCH 18.9 (L) 26.0 - 34.0 pg   MCHC 26.2 (L) 30.0 - 36.0 g/dL   RDW 13.2 (H) 44.0 - 10.2 %   Platelets 514 (H) 150 - 400 K/uL   nRBC 0.2 0.0 - 0.2 %   Neutrophils Relative % 60 %   Neutro Abs 7.5 1.7 - 7.7 K/uL   Lymphocytes Relative 28 %   Lymphs Abs 3.5 0.7 - 4.0 K/uL   Monocytes Relative 7 %   Monocytes Absolute 0.9 0.1 - 1.0 K/uL   Eosinophils Relative 3 %   Eosinophils Absolute 0.4 0.0 - 0.5 K/uL   Basophils Relative 1 %   Basophils Absolute 0.1 0.0 - 0.1 K/uL   nRBC 0 0 /100 WBC   Immature Granulocytes 1 %   Abs Immature Granulocytes 0.08 (H) 0.00 - 0.07 K/uL    Comment: Performed at [facility], [address] [place], [place] [address]  Comprehensive metabolic panel     Status: Abnormal   Collection Time: 09/02/22  9:44 AM  Result Value Ref Range   Sodium 138 135 - 145 mmol/L   Potassium 3.8 3.5 - 5.1 mmol/L   Chloride 105 98 - 111 mmol/L   CO2 23 22 - 32 mmol/L   Glucose, Bld 159 (H) 70 - 99 mg/dL    Comment: Glucose reference range applies only to samples taken after fasting for at least 8 hours.   BUN 14 8 - 23 mg/dL   Creatinine, Ser 6.44  0.44 - 1.00 mg/dL   Calcium 9.3 8.9 - 03.4 mg/dL   Total Protein 6.5 6.5 - 8.1 g/dL   Albumin 2.9 (L) 3.5 - 5.0 g/dL   AST 20 15 -  41 U/L   ALT 15 0 - 44 U/L   Alkaline Phosphatase 100 38 - 126 U/L   Total Bilirubin 0.3 0.3 - 1.2 mg/dL   GFR, Estimated >57 >84 mL/min    Comment: (NOTE) Calculated using the CKD-EPI Creatinine Equation (2021)    Anion gap 10 5 - 15    Comment: Performed at [facility], [address] [place], [place] [address]  CBC     Status: Abnormal   Collection Time: 09/02/22  9:44 AM  Result Value Ref Range   WBC 12.6 (H) 4.0 - 10.5 K/uL   RBC 3.01 (L) 3.87 - 5.11 MIL/uL   Hemoglobin 5.8 (LL) 12.0 - 15.0 g/dL    Comment: REPEATED TO VERIFY CRITICAL RESULT CALLED TO, READ BACK BY AND VERIFIED WITH: ESalomon Fick, RN @ 10:32 08.19.24    HCT 22.3 (L) 36.0 - 46.0 %   MCV 74.1 (L) 80.0 - 100.0 fL   MCH 19.3 (L) 26.0 - 34.0 pg   MCHC 26.0 (L) 30.0 - 36.0 g/dL   RDW 52.8 (H) 41.3 - 24.4 %   Platelets 519 (H) 150 - 400 K/uL   nRBC 0 0.0 - 0.2 %    Comment: Performed at [facility], [address] [place], [place] [address]  Vitamin B12     Status: Abnormal   Collection Time: 09/02/22  9:57 AM  Result Value Ref Range   Vitamin B-12 2,481 (H) 180 - 914 pg/mL    Comment: (NOTE) This assay is not validated for testing neonatal or myeloproliferative syndrome specimens for Vitamin B12 levels. Performed at [facility], [address] [place], [place] [address]   Folate     Status: None   Collection Time: 09/02/22  9:57 AM  Result Value Ref Range   Folate 31.1 >5.9 ng/mL    Comment: Performed at [facility], [address], [place], [place] [address]  Iron and TIBC     Status: Abnormal   Collection Time: 09/02/22  9:57 AM  Result Value Ref Range   Iron 7 (L) 28 - 170 ug/dL   TIBC 742 595 - 638 ug/dL   Saturation Ratios 2 (L) 10.4 - 31.8 %   UIBC 440 ug/dL    Comment: Performed at [facility], [address], [place],  [place] [address]  Ferritin     Status: Abnormal   Collection Time: 09/02/22  9:57 AM  Result Value Ref Range   Ferritin 2 (L) 11 - 307 ng/mL    Comment: Performed at [facility], [address], [place], [place] [address]  Prepare RBC (crossmatch)     Status: None   Collection Time: 09/02/22 11:13 AM  Result Value Ref Range   Order Confirmation      ORDER PROCESSED BY BLOOD BANK Performed at [facility], [address] [place], [place] [address]   POC occult blood, ED     Status: None   Collection Time: 09/02/22 11:53 AM  Result Value Ref Range   Fecal Occult Bld NEGATIVE NEGATIVE  ABO/Rh     Status: None   Collection Time: 09/02/22 11:59 AM  Result Value Ref Range   ABO/RH(D)      O POS Performed at [facility], [address] [place], [place] [address]    No results found.  Pending Labs [employer] (From admission, onward)     Start  Ordered   09/03/22 0500  Comprehensive metabolic panel  Tomorrow morning,   R        09/02/22 1237   09/03/22 0500  CBC  Tomorrow morning,   R        09/02/22 1237            Vitals/Pain Today's Vitals   09/02/22 1500 09/02/22 1545 09/02/22 1643 09/02/22 1656  BP: 109/60 112/65 (!) 151/75 (!) 128/51  Pulse: 81 79 89 84  Resp: (!) 29 16 (!) 27 (!) 25  Temp:   (!) 97 F (36.1 C) 97.6 F (36.4 C)  TempSrc:   Axillary Axillary  SpO2: 97% 99%  96%  Weight:      Height:      PainSc:        Isolation Precautions No active isolations  Medications Medications  atorvastatin (LIPITOR) tablet 40 mg (has no administration in time range)  albuterol (PROVENTIL) (2.5 MG/3ML) 0.083% nebulizer solution 2.5 mg (has no administration in time range)  sodium chloride flush (NS) 0.9 % injection 3 mL (3 mLs Intravenous Given 09/02/22 1432)  acetaminophen (TYLENOL) tablet 650 mg (has no administration in time range)    Or  acetaminophen (TYLENOL) suppository 650 mg (has no administration in time range)  polyethylene  glycol (MIRALAX / GLYCOLAX) packet 17 g (has no administration in time range)  insulin aspart (novoLOG) injection 0-9 Units (has no administration in time range)  insulin glargine-yfgn (SEMGLEE) injection 8 Units (has no administration in time range)  nicotine (NICODERM CQ - dosed in mg/24 hours) patch 21 mg (21 mg Transdermal Patch Applied 09/02/22 1629)  0.9 %  sodium chloride infusion (Manually program via Guardrails IV Fluids) ( Intravenous New Bag/Given 09/02/22 1432)    Mobility walks     Focused Assessments Hemoglobin 5.8 with PCP on Friday   R Recommendations: See Admitting Provider Note  Report given to:   Additional Notes:

## 2022-09-03 ENCOUNTER — Other Ambulatory Visit (HOSPITAL_COMMUNITY): Payer: Self-pay

## 2022-09-03 DIAGNOSIS — D649 Anemia, unspecified: Secondary | ICD-10-CM | POA: Diagnosis not present

## 2022-09-03 LAB — COMPREHENSIVE METABOLIC PANEL
ALT: 15 U/L (ref 0–44)
AST: 15 U/L (ref 15–41)
Albumin: 2.8 g/dL — ABNORMAL LOW (ref 3.5–5.0)
Alkaline Phosphatase: 90 U/L (ref 38–126)
Anion gap: 13 (ref 5–15)
BUN: 15 mg/dL (ref 8–23)
CO2: 23 mmol/L (ref 22–32)
Calcium: 9.1 mg/dL (ref 8.9–10.3)
Chloride: 104 mmol/L (ref 98–111)
Creatinine, Ser: 0.79 mg/dL (ref 0.44–1.00)
GFR, Estimated: >60 mL/min (ref >60)
Glucose, Bld: 140 mg/dL — ABNORMAL HIGH (ref 70–99)
Potassium: 3.9 mmol/L (ref 3.5–5.1)
Sodium: 140 mmol/L (ref 135–145)
Total Bilirubin: 0.1 mg/dL — ABNORMAL LOW (ref 0.3–1.2)
Total Protein: 6.2 g/dL — ABNORMAL LOW (ref 6.5–8.1)

## 2022-09-03 LAB — BPAM RBC
Blood Product Expiration Date: 202409102359
Blood Product Expiration Date: 202409102359
ISSUE DATE / TIME: 202408191347
ISSUE DATE / TIME: 202408191607
Unit Type and Rh: 5100
Unit Type and Rh: 5100

## 2022-09-03 LAB — TYPE AND SCREEN
ABO/RH(D): O POS
Antibody Screen: NEGATIVE
Unit division: 0
Unit division: 0

## 2022-09-03 LAB — CBC
HCT: 27 % — ABNORMAL LOW (ref 36.0–46.0)
Hemoglobin: 7.8 g/dL — ABNORMAL LOW (ref 12.0–15.0)
MCH: 21.6 pg — ABNORMAL LOW (ref 26.0–34.0)
MCHC: 28.9 g/dL — ABNORMAL LOW (ref 30.0–36.0)
MCV: 74.8 fL — ABNORMAL LOW (ref 80.0–100.0)
Platelets: 441 10*3/uL — ABNORMAL HIGH (ref 150–400)
RBC: 3.61 MIL/uL — ABNORMAL LOW (ref 3.87–5.11)
RDW: 18 % — ABNORMAL HIGH (ref 11.5–15.5)
WBC: 10.9 10*3/uL — ABNORMAL HIGH (ref 4.0–10.5)
nRBC: 0.2 % (ref 0.0–0.2)

## 2022-09-03 LAB — GLUCOSE, CAPILLARY: Glucose-Capillary: 163 mg/dL — ABNORMAL HIGH (ref 70–99)

## 2022-09-03 MED ORDER — FOLIC ACID 1 MG PO TABS
1.0000 mg | ORAL_TABLET | Freq: Every day | ORAL | 0 refills | Status: DC
  Filled 2022-09-03: qty 30, 30d supply, fill #0

## 2022-09-03 MED ORDER — PANTOPRAZOLE SODIUM 40 MG PO TBEC
40.0000 mg | DELAYED_RELEASE_TABLET | Freq: Every day | ORAL | 0 refills | Status: DC
  Filled 2022-09-03: qty 30, 30d supply, fill #0

## 2022-09-03 MED ORDER — PANTOPRAZOLE SODIUM 40 MG PO TBEC
40.0000 mg | DELAYED_RELEASE_TABLET | Freq: Every day | ORAL | Status: DC
  Administered 2022-09-03: 40 mg via ORAL
  Filled 2022-09-03: qty 1

## 2022-09-03 MED ORDER — FERROUS SULFATE 325 (65 FE) MG PO TABS: 325.0000 mg | ORAL_TABLET | Freq: Two times a day (BID) | ORAL | Status: DC

## 2022-09-03 MED ORDER — FOLIC ACID 1 MG PO TABS
1.0000 mg | ORAL_TABLET | Freq: Every day | ORAL | Status: DC
  Administered 2022-09-03: 1 mg via ORAL
  Filled 2022-09-03: qty 1

## 2022-09-03 MED ORDER — SODIUM CHLORIDE 0.9 % IV SOLN
510.0000 mg | Freq: Once | INTRAVENOUS | Status: AC
  Administered 2022-09-03: 510 mg via INTRAVENOUS
  Filled 2022-09-03: qty 17

## 2022-09-03 MED ORDER — FERROUS SULFATE 325 (65 FE) MG PO TABS
325.0000 mg | ORAL_TABLET | Freq: Two times a day (BID) | ORAL | 0 refills | Status: DC
  Filled 2022-09-03: qty 100, 50d supply, fill #0

## 2022-09-03 NOTE — Plan of Care (Signed)
  Problem: Coping: Goal: Ability to adjust to condition or change in health will improve Outcome: Progressing   Problem: Education: Goal: Knowledge of General Education information will improve Description: Including pain rating scale, medication(s)/side effects and non-pharmacologic comfort measures Outcome: Progressing   Problem: Clinical Measurements: Goal: Ability to maintain clinical measurements within normal limits will improve Outcome: Progressing   Problem: Activity: Goal: Risk for activity intolerance will decrease Outcome: Progressing   Problem: Safety: Goal: Ability to remain free from injury will improve Outcome: Progressing

## 2022-09-03 NOTE — TOC Transition Note (Signed)
Transition of Care [facility]) - CM/SW Discharge Note   Patient Details  Name: Allison Hughes MRN: [id number] Date of Birth: April 24, 1950  Transition of Care [facility]) CM/SW Contact:  Gordy Clement, RN Phone Number: 09/03/2022, 9:04 AM   Clinical Narrative:     Patient will DC to home today with Home Health PT provided by [facility] . Family/Friend will transport to home  No Additional TOC needs AVS updated          Patient Goals and CMS Choice      Discharge Placement                         Discharge Plan and Services Additional resources added to the After Visit Summary for                                       Social Determinants of Health (SDOH) Interventions SDOH Screenings   Food Insecurity: No Food Insecurity (09/02/2022)  Housing: Patient Declined (09/02/2022)  Transportation Needs: No Transportation Needs (09/02/2022)  Utilities: Not At Risk (09/02/2022)  Depression (PHQ2-9): Low Risk  (10/26/2020)  Tobacco Use: High Risk (09/02/2022)     Readmission Risk Interventions     No data to display

## 2022-09-03 NOTE — Discharge Summary (Signed)
Allison Hughes WNU:[id number] DOB: 03-11-1950 DOA: 09/02/2022  PCP: Ralene Ok, MD  Admit date: 09/02/2022  Discharge date: 09/03/2022  Admitted From: Home   Disposition:  Home   Recommendations for Outpatient Follow-up:   Follow up with PCP in 1-2 weeks  PCP Please obtain BMP/CBC, 2 view CXR in 1week,  (see Discharge instructions)   PCP Please follow up on the following pending results: Monitor CBC, anemia panel closely, needs outpatient iron deficiency anemia workup, must see a gastroenterologist as well in 1 to 2 weeks as she never had colonoscopy.   Home Health: PT   Equipment/Devices: as below  Consultations: None  Discharge Condition: Stable    CODE STATUS: Full    Diet Recommendation: Heart Healthy     Chief Complaint  Patient presents with   Abnormal Lab     Brief history of present illness from the day of admission and additional interim summary    72 y.o. female with medical history significant of diabetes, hypertension, stroke, anxiety, hyperlipidemia, obesity presenting with abnormal lab from PCP office after evaluation for fatigue.   Patient states she has had several weeks of fatigue and has got to the point where she has to sit after getting up to go to the fridge due to feeling lightheaded and short of breath.  Saw her PCP 3 days ago and after labs resulted today patient was instructed to go to the ED for further evaluation due to hemoglobin of 5.8.  Patient is on Plavix but denies any dark or bloody stools or other source of bleeding.  She had Hemoccult negative stool.                                                                 Hospital Course    Symptomatic iron deficiency anemia.  Heme occult negative however on dual antiplatelet therapy and has never had colonoscopy.  She is s/p 2  units packed RBC transfusion with appropriate rise in H&H posttransfusion, also received IV iron infusion today along with oral iron and folic acid supplementation which she will be placed on, placed on PPI.  She is eager to go home and symptom-free will be discharged home with outpatient iron deficiency anemia workup as age-appropriate by PCP, I would strongly recommend outpatient EGD colonoscopy she has never had it before.  Continue to monitor anemia panel and CBC closely by PCP.   Leukocytosis Thrombocytosis > Leukocytosis to 12.6 and thrombocytosis of 519 in ED.  Likely reactive in the setting of degree of anemia as above. > No infectious symptoms. PCP to monitor.   DM 2 Continue home regimen   Hypertension -No acute issues continue home regimen   Hyperlipidemia History of CVA - Continue home atorvastatin -Along with DAPT resumed   Anxiety - Continue home Xanax  Lung disease > Patient follow-up with PCP and pulmonary as desired.  Discharge diagnosis     Principal Problem:   Symptomatic anemia Active Problems:   Essential hypertension   Type 2 diabetes mellitus without complication, without long-term current use of insulin (HCC)   History of CVA (cerebrovascular accident)    Discharge instructions    Discharge Instructions     Ambulatory Referral for Lung Cancer Scre   Complete by: As directed    Diet - low sodium heart healthy   Complete by: As directed    Discharge instructions   Complete by: As directed    Follow with Primary MD Ralene Ok, MD in 7 days get a referral for a gastroenterologist for EGD and colonoscopy.  Get CBC, CMP, anemia panel-  checked next visit with your primary MD    Activity: As tolerated with Full fall precautions use walker/cane & assistance as needed  Disposition Home    Diet: Heart Healthy   Special Instructions: If you have smoked or chewed Tobacco  in the last 2 yrs please stop smoking, stop any regular Alcohol  and or  any Recreational drug use.  On your next visit with your primary care physician please Get Medicines reviewed and adjusted.  Please request your Prim.MD to go over all Hospital Tests and Procedure/Radiological results at the follow up, please get all Hospital records sent to your Prim MD by signing hospital release before you go home.  If you experience worsening of your admission symptoms, develop shortness of breath, life threatening emergency, suicidal or homicidal thoughts you must seek medical attention immediately by calling 911 or calling your MD immediately  if symptoms less severe.  You Must read complete instructions/literature along with all the possible adverse reactions/side effects for all the Medicines you take and that have been prescribed to you. Take any new Medicines after you have completely understood and accpet all the possible adverse reactions/side effects.   Increase activity slowly   Complete by: As directed        Discharge Medications   Allergies as of 09/03/2022       Reactions   Iodine Swelling   Shrimp [shellfish Allergy] Swelling   Fresh shrimp        Medication List     TAKE these medications    albuterol 108 (90 Base) MCG/ACT inhaler Commonly known as: VENTOLIN HFA Inhale 2 puffs into the lungs every 6 (six) hours as needed for wheezing or shortness of breath.   ALPRAZolam 1 MG tablet Commonly known as: XANAX Take 1 mg by mouth 2 (two) times daily as needed for anxiety or sleep. For anxiety and sleep.   aspirin EC 81 MG tablet Take 81 mg by mouth daily.   atorvastatin 40 MG tablet Commonly known as: LIPITOR Take 40 mg by mouth daily.   clopidogrel 75 MG tablet Commonly known as: PLAVIX Take 1 tablet (75 mg total) by mouth daily.   ferrous sulfate 325 (65 FE) MG tablet Take 1 tablet (325 mg total) by mouth 2 (two) times daily with a meal.   FISH OIL PO Take 1 capsule by mouth at bedtime.   folic acid 1 MG tablet Commonly known  as: FOLVITE Take 1 tablet (1 mg total) by mouth daily.   lisinopril 20 MG tablet Commonly known as: ZESTRIL Take 20 mg by mouth at bedtime.   metFORMIN 500 MG 24 hr tablet Commonly known as: GLUCOPHAGE-XR Take 500 mg by mouth at bedtime.  Ozempic (1 MG/DOSE) 4 MG/3ML Sopn Generic drug: Semaglutide (1 MG/DOSE) Inject 1 mg into the skin every Saturday.   pantoprazole 40 MG tablet Commonly known as: PROTONIX Take 1 tablet (40 mg total) by mouth daily.   Evaristo Bury FlexTouch 100 UNIT/ML FlexTouch Pen Generic drug: insulin degludec Inject 12 Units into the skin daily. What changed:  how much to take when to take this reasons to take this   Vitamin B-12 3000 MCG/ML Liqd Place 1,500 mcg under the tongue daily.               Durable Medical Equipment  (From admission, onward)           Start     Ordered   09/03/22 0821  For home use only DME Walker rolling  Once       Comments: 5 wheel  Question Answer Comment  Walker: With 5 Inch Wheels   Patient needs a walker to treat with the following condition Weakness      09/03/22 0820             Follow-up Information     Ralene Ok, MD. Schedule an appointment as soon as possible for a visit in 1 week(s).   Specialty: Internal Medicine Contact information: [address] [place] [address] [phone]         Kathi Der, MD. Schedule an appointment as soon as possible for a visit in 1 week(s).   Specialty: Gastroenterology Why: Iron deficiency anemia workup Contact information: [address] [place] [address] [phone]                 Major procedures and Radiology Reports - PLEASE review detailed and final reports thoroughly  -        CUP PACEART REMOTE DEVICE CHECK  Result Date: 08/07/2022 ILR summary report received. Battery status OK. Normal device function. No new symptom, tachy, brady, or pause episodes. No new AF episodes. Monthly summary reports  and ROV/PRN LA, CVRS   Micro Results    No results found for this or any previous visit (from the past 240 hour(s)).  Today   Subjective    Allison Hughes today has no headache,no chest abdominal pain,no new weakness tingling or numbness, feels much better wants to go home today.    Objective   Blood pressure 136/63, pulse 95, temperature 97.7 F (36.5 C), temperature source Oral, resp. rate 14, height 5\' 2"  (1.575 m), weight 82.2 kg, SpO2 93%.   Intake/Output Summary (Last 24 hours) at 09/03/2022 2952 Last data filed at 09/02/2022 2156 Gross per 24 hour  Intake 971 ml  Output --  Net 971 ml    Exam  Awake Alert, No new F.N deficits, bilateral hearing loss [place].AT,PERRAL Supple Neck,   Symmetrical Chest wall movement, Good air movement bilaterally, CTAB RRR,No Gallops,   +ve B.Sounds, Abd Soft, Non tender,  No Cyanosis, Clubbing or edema    Data Review   Recent Labs  Lab 09/02/22 0940 09/02/22 0944 09/03/22 0050  WBC 12.4* 12.6* 10.9*  HGB 5.8* 5.8* 7.8*  HCT 22.1* 22.3* 27.0*  PLT 514* 519* 441*  MCV 72.0* 74.1* 74.8*  MCH 18.9* 19.3* 21.6*  MCHC 26.2* 26.0* 28.9*  RDW 16.1* 16.2* 18.0*  LYMPHSABS 3.5  --   --   MONOABS 0.9  --   --   EOSABS 0.4  --   --   BASOSABS 0.1  --   --  Recent Labs  Lab 09/02/22 0944 09/03/22 0050  NA 138 140  K 3.8 3.9  CL 105 104  CO2 23 23  ANIONGAP 10 13  GLUCOSE 159* 140*  BUN 14 15  CREATININE 0.70 0.79  AST 20 15  ALT 15 15  ALKPHOS 100 90  BILITOT 0.3 0.1*  ALBUMIN 2.9* 2.8*  CALCIUM 9.3 9.1    Total Time in preparing paper work, data evaluation and todays exam - 35 minutes  Signature  -    Susa Raring M.D on 09/03/2022 at 8:22 AM   -  To page go to [email or web address]

## 2022-09-03 NOTE — Plan of Care (Signed)
Patient is discharging home with family.

## 2022-09-03 NOTE — Discharge Instructions (Signed)
Follow with Primary MD Ralene Ok, MD in 7 days get a referral for a gastroenterologist for EGD and colonoscopy.  Get CBC, CMP, anemia panel-  checked next visit with your primary MD    Activity: As tolerated with Full fall precautions use walker/cane & assistance as needed  Disposition Home    Diet: Heart Healthy   Special Instructions: If you have smoked or chewed Tobacco  in the last 2 yrs please stop smoking, stop any regular Alcohol  and or any Recreational drug use.  On your next visit with your primary care physician please Get Medicines reviewed and adjusted.  Please request your Prim.MD to go over all Hospital Tests and Procedure/Radiological results at the follow up, please get all Hospital records sent to your Prim MD by signing hospital release before you go home.  If you experience worsening of your admission symptoms, develop shortness of breath, life threatening emergency, suicidal or homicidal thoughts you must seek medical attention immediately by calling 911 or calling your MD immediately  if symptoms less severe.  You Must read complete instructions/literature along with all the possible adverse reactions/side effects for all the Medicines you take and that have been prescribed to you. Take any new Medicines after you have completely understood and accpet all the possible adverse reactions/side effects.

## 2022-09-09 ENCOUNTER — Ambulatory Visit (INDEPENDENT_AMBULATORY_CARE_PROVIDER_SITE_OTHER): Payer: 59

## 2022-09-09 DIAGNOSIS — I63412 Cerebral infarction due to embolism of left middle cerebral artery: Secondary | ICD-10-CM | POA: Diagnosis not present

## 2022-09-09 LAB — CUP PACEART REMOTE DEVICE CHECK
Date Time Interrogation Session: 20240824230828
Implantable Pulse Generator Implant Date: 20220826

## 2022-09-18 NOTE — Progress Notes (Signed)
Carelink Summary Report / Loop Recorder 

## 2022-09-30 ENCOUNTER — Ambulatory Visit: Payer: 59 | Admitting: Neurology

## 2022-10-11 LAB — CUP PACEART REMOTE DEVICE CHECK
Date Time Interrogation Session: 20240926230801
Implantable Pulse Generator Implant Date: 20220826

## 2022-10-14 ENCOUNTER — Other Ambulatory Visit (HOSPITAL_COMMUNITY): Payer: Self-pay

## 2022-10-14 ENCOUNTER — Ambulatory Visit: Payer: 59

## 2022-10-14 DIAGNOSIS — I63412 Cerebral infarction due to embolism of left middle cerebral artery: Secondary | ICD-10-CM | POA: Diagnosis not present

## 2022-10-15 ENCOUNTER — Ambulatory Visit: Payer: 59 | Admitting: Neurology

## 2022-10-25 NOTE — Progress Notes (Signed)
Carelink Summary Report / Loop Recorder 

## 2022-11-14 LAB — CUP PACEART REMOTE DEVICE CHECK
Date Time Interrogation Session: 20241029230635
Implantable Pulse Generator Implant Date: 20220826

## 2022-11-18 ENCOUNTER — Ambulatory Visit (INDEPENDENT_AMBULATORY_CARE_PROVIDER_SITE_OTHER): Payer: 59

## 2022-11-18 DIAGNOSIS — I63412 Cerebral infarction due to embolism of left middle cerebral artery: Secondary | ICD-10-CM

## 2022-12-03 NOTE — Progress Notes (Signed)
Carelink Summary Report / Loop Recorder 

## 2022-12-23 ENCOUNTER — Ambulatory Visit (INDEPENDENT_AMBULATORY_CARE_PROVIDER_SITE_OTHER): Payer: 59

## 2022-12-23 DIAGNOSIS — I63412 Cerebral infarction due to embolism of left middle cerebral artery: Secondary | ICD-10-CM

## 2022-12-23 LAB — CUP PACEART REMOTE DEVICE CHECK
Date Time Interrogation Session: 20241208230709
Implantable Pulse Generator Implant Date: 20220826

## 2023-01-27 ENCOUNTER — Ambulatory Visit: Payer: 59

## 2023-01-27 DIAGNOSIS — I63412 Cerebral infarction due to embolism of left middle cerebral artery: Secondary | ICD-10-CM | POA: Diagnosis not present

## 2023-01-27 LAB — CUP PACEART REMOTE DEVICE CHECK
Date Time Interrogation Session: 20250112230917
Implantable Pulse Generator Implant Date: 20220826

## 2023-03-03 ENCOUNTER — Ambulatory Visit (INDEPENDENT_AMBULATORY_CARE_PROVIDER_SITE_OTHER): Payer: 59

## 2023-03-03 DIAGNOSIS — I63412 Cerebral infarction due to embolism of left middle cerebral artery: Secondary | ICD-10-CM | POA: Diagnosis not present

## 2023-03-04 LAB — CUP PACEART REMOTE DEVICE CHECK
Date Time Interrogation Session: 20250216230737
Implantable Pulse Generator Implant Date: 20220826

## 2023-03-06 NOTE — Progress Notes (Signed)
 [facility] Summary Report / Loop Recorder

## 2023-04-07 ENCOUNTER — Ambulatory Visit: Payer: 59

## 2023-04-07 DIAGNOSIS — I63412 Cerebral infarction due to embolism of left middle cerebral artery: Secondary | ICD-10-CM

## 2023-04-07 LAB — CUP PACEART REMOTE DEVICE CHECK
Date Time Interrogation Session: 20250323230612
Implantable Pulse Generator Implant Date: 20220826

## 2023-04-08 NOTE — Addendum Note (Signed)
 Addended by: Geralyn Flash D on: 04/08/2023 11:55 AM   Modules accepted: Orders

## 2023-04-08 NOTE — Progress Notes (Signed)
 [facility] Summary Report / Loop Recorder

## 2023-05-12 ENCOUNTER — Ambulatory Visit (INDEPENDENT_AMBULATORY_CARE_PROVIDER_SITE_OTHER): Payer: 59

## 2023-05-12 DIAGNOSIS — I63412 Cerebral infarction due to embolism of left middle cerebral artery: Secondary | ICD-10-CM

## 2023-05-12 LAB — CUP PACEART REMOTE DEVICE CHECK
Date Time Interrogation Session: 20250427230711
Implantable Pulse Generator Implant Date: 20220826

## 2023-05-22 NOTE — Progress Notes (Signed)
 [facility] Summary Report / Loop Recorder

## 2023-06-16 ENCOUNTER — Ambulatory Visit (INDEPENDENT_AMBULATORY_CARE_PROVIDER_SITE_OTHER)

## 2023-06-16 ENCOUNTER — Ambulatory Visit: Payer: Self-pay | Admitting: Cardiology

## 2023-06-16 DIAGNOSIS — I63412 Cerebral infarction due to embolism of left middle cerebral artery: Secondary | ICD-10-CM | POA: Diagnosis not present

## 2023-06-16 LAB — CUP PACEART REMOTE DEVICE CHECK
Date Time Interrogation Session: 20250601231716
Implantable Pulse Generator Implant Date: 20220826

## 2023-06-26 NOTE — Progress Notes (Signed)
 [facility] Summary Report / Loop Recorder

## 2023-07-08 IMAGING — MR MR HEAD W/O CM
4 of 10 series · 21 of 48 positions shown · non-contrast
Comparison: CT head 09/06/2020.

CLINICAL DATA: Neuro deficit, acute, stroke suspected

EXAM:
MRI HEAD WITHOUT CONTRAST
TECHNIQUE: Multiplanar, multiecho pulse sequences of the brain and surrounding
structures were obtained without intravenous contrast.

[Series 2: DWI · axial · 3.0mm · 0.94mm/px · z∈[-103,+37]mm · 8 of 98 slices shown (1 of 2)]
[im 1/98]
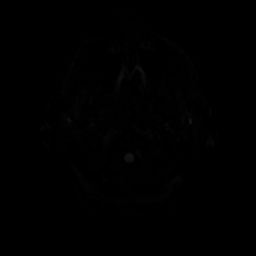
[im 11/98]
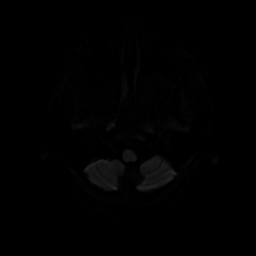
[im 33/98]
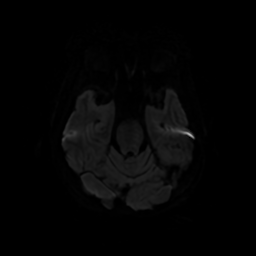
[im 44/98]
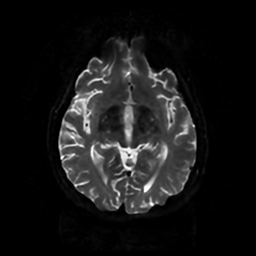
[im 54/98]
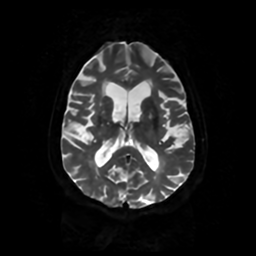
[im 65/98]
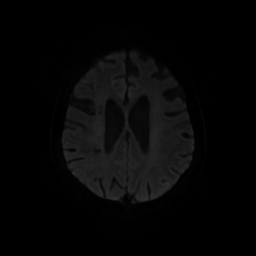
[im 87/98]
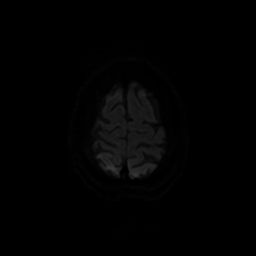
[im 98/98]
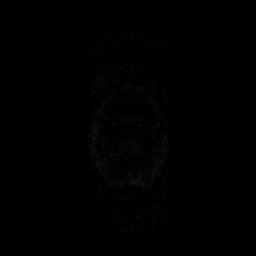

[Series 3: DWI · coronal · 4.0mm · 0.94mm/px · 6 of 68 slices shown (2 of 2)]
[im 1/68]
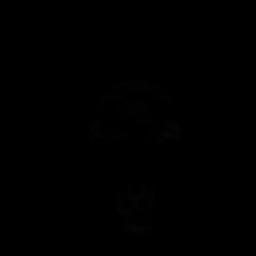
[im 10/68]
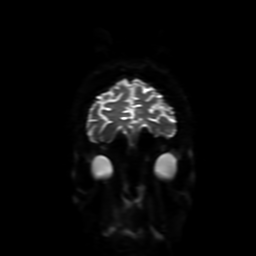
[im 20/68]
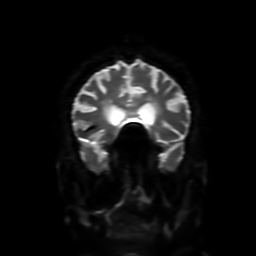
[im 29/68]
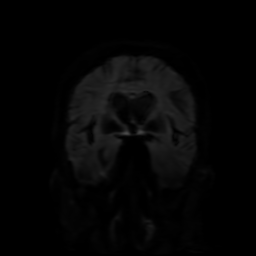
[im 39/68]
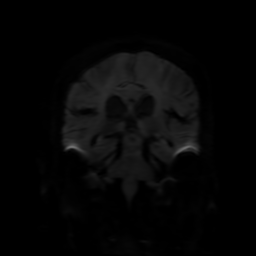
[im 58/68]
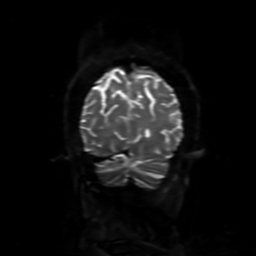

[Series 4: FLAIR · axial · 4.0mm · 0.45mm/px · z∈[-102,+35]mm · 4 of 33 slices shown (1 of 2)]
[im 1/33]
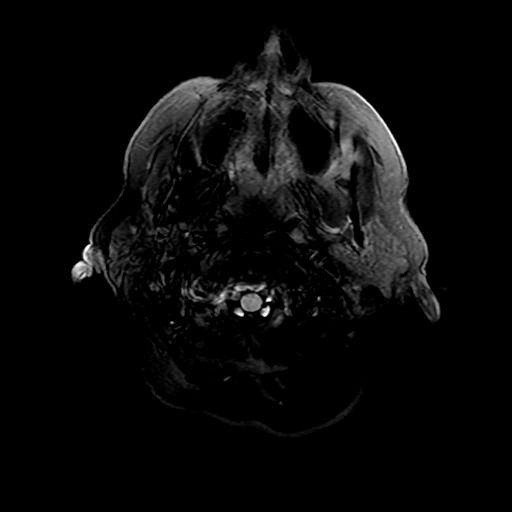
[im 11/33]
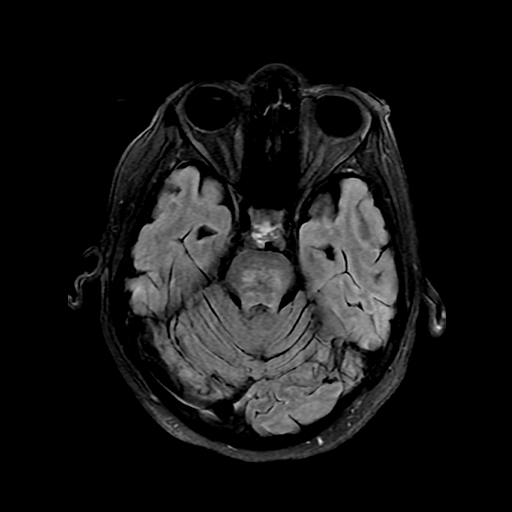
[im 22/33]
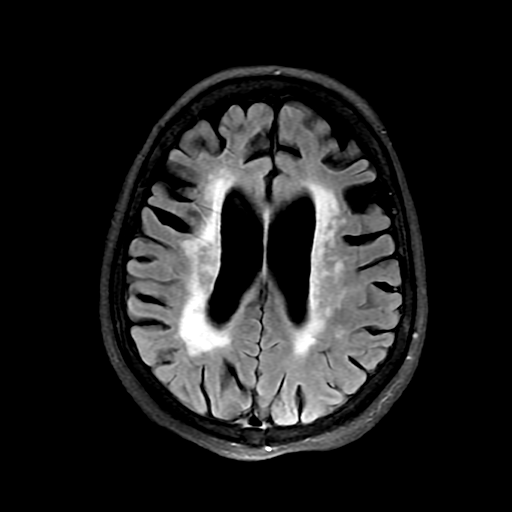
[im 33/33]
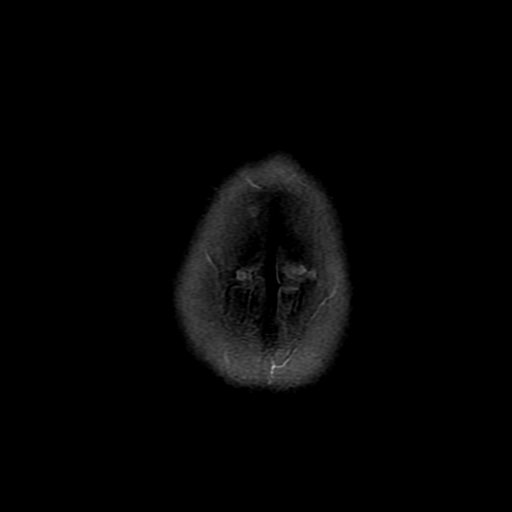

[Series 6: FLAIR · sagittal · 5.0mm · 0.23mm/px · 3 of 23 slices shown (2 of 2)]
[im 1/23]
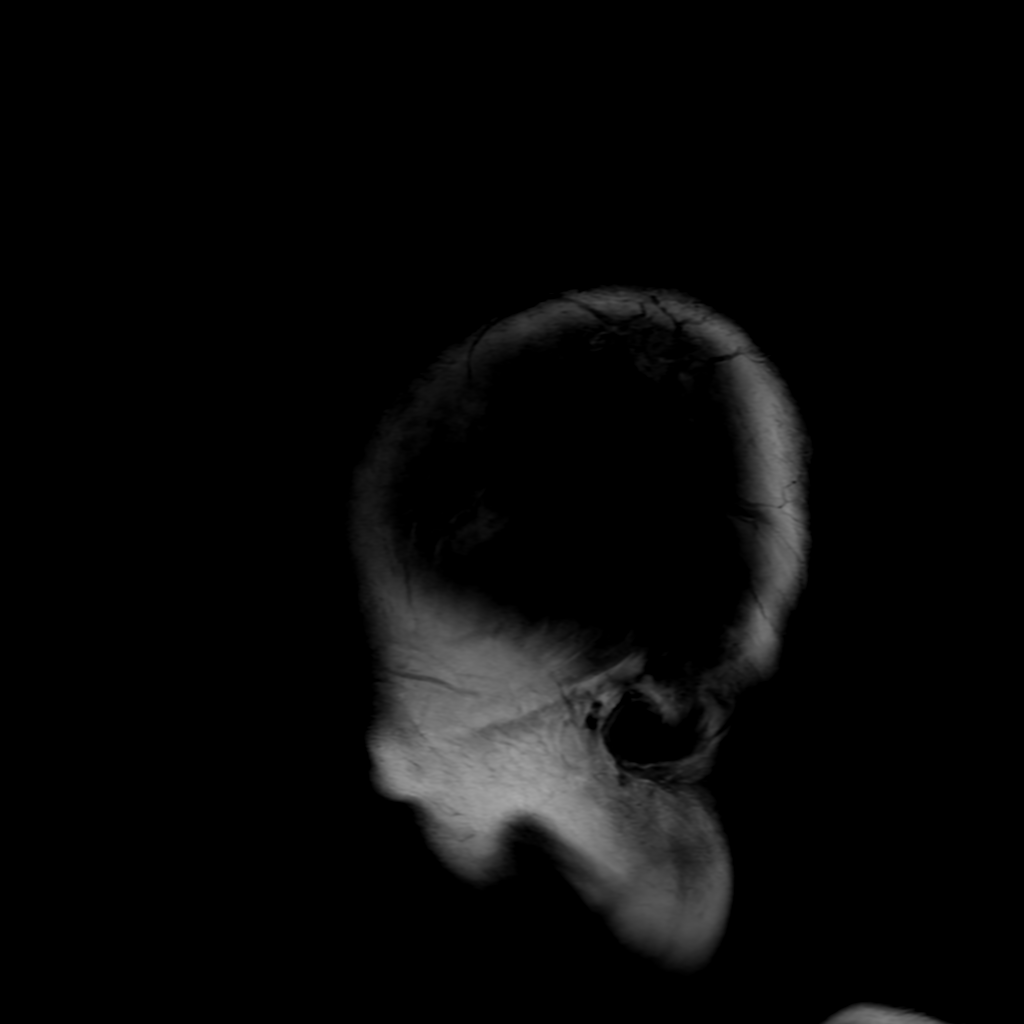
[im 12/23]
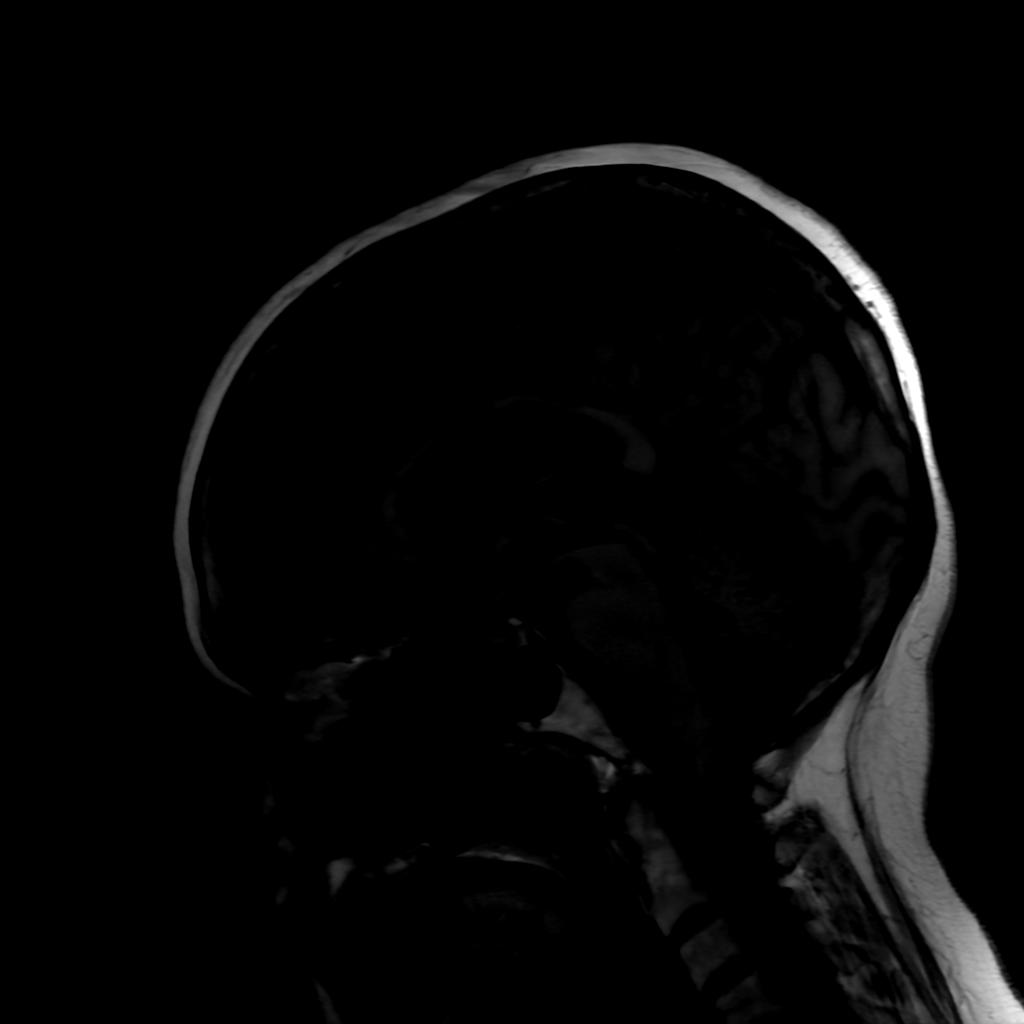
[im 23/23]
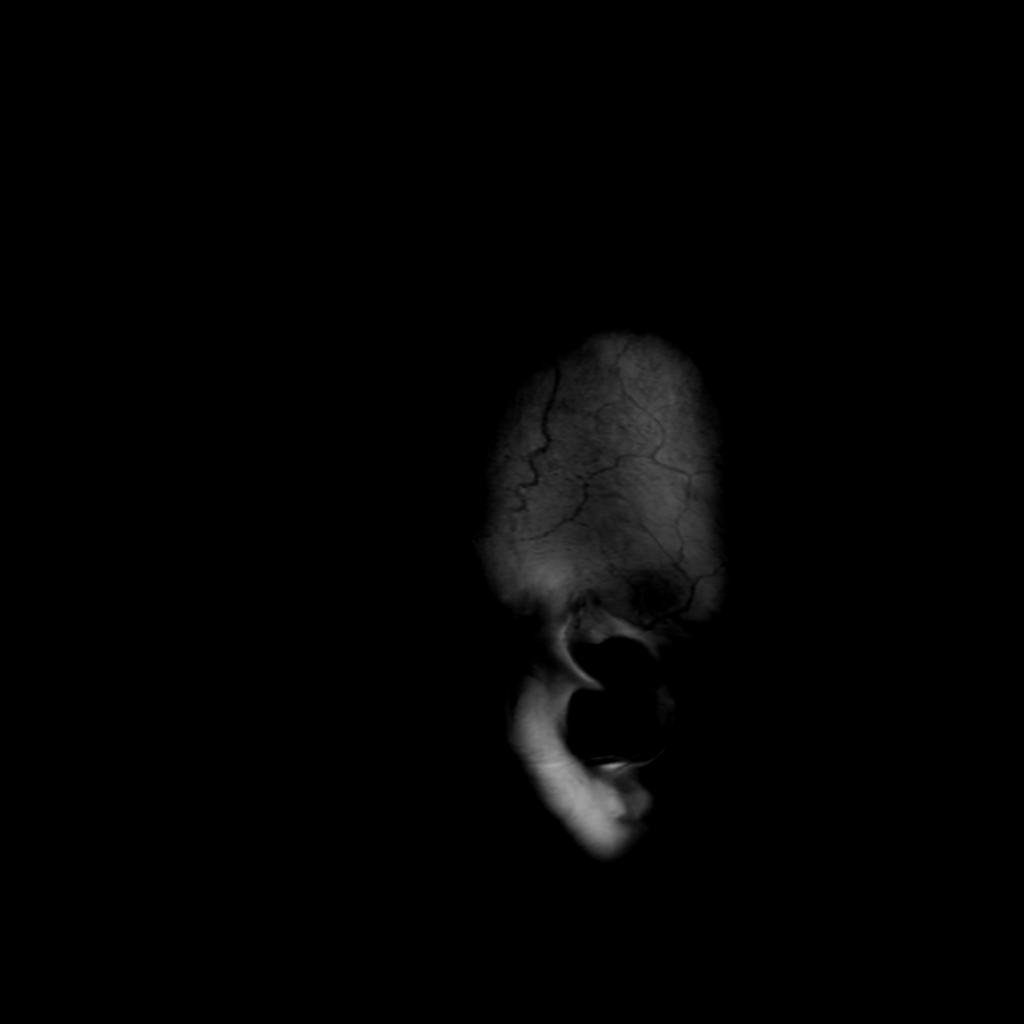

[21 of 48 positions shown; findings below may reference images not displayed]

FINDINGS: Brain: Small cortical infarct in the posterior left parietal lobe.
Slight edema without mass effect. Remote infarcts in the
inferolateral left cerebellum, right perirolandic frontoparietal
region, and right frontal white matter with associated
encephalomalacia and gliosis. Moderate patchy confluent T2
hyperintensity in the supratentorial and pontine white matter,
nonspecific but compatible with chronic microvascular ischemic
disease. No evidence of acute hemorrhage, mass lesion, midline
shift, extra-axial fluid collection, or hydrocephalus.
Mild-to-moderate atrophy with ex vacuo ventricular dilation.
Bilateral basal ganglia mineralization.

Vascular: Major arterial flow voids are maintained at the skull
base.

Skull and upper cervical spine: Normal marrow signal.

Sinuses/Orbits: Largely clear sinuses. No evidence of acute orbital
abnormality.

Other: No mastoid effusions.
IMPRESSION: 1. Small cortical infarct in the posterior left parietal lobe.Slight
edema without mass effect.
2. Remote infarcts in the inferolateral left cerebellum, right
perirolandic frontoparietal region, and right frontal white matter.
3. Moderate chronic microvascular ischemic disease and mild to
moderate atrophy.

## 2023-07-08 IMAGING — MR MR MRA NECK WO/W CM
4 of 7 series · 17 of 48 positions shown · IV contrast (Yes GAD)
Comparison: Prior brain MRI from earlier the same day.

CLINICAL DATA: Follow-up examination for acute stroke.

EXAM:
MRA HEAD WITHOUT CONTRAST
MRA NECK WITHOUT AND WITH CONTRAST
TECHNIQUE: Angiographic images of the Circle of Willis were acquired using MRA
technique without intravenous contrast. Angiographic images of the
neck were acquired using MRA technique without and with intravenous
contrast. Carotid stenosis measurements (when applicable) are
obtained utilizing NASCET criteria, using the distal internal
carotid diameter as the denominator.
CONTRAST:  7.5 cc of Gadavist.

[Series 500: cor cemra ft · coronal · 1.2mm · 0.59mm/px · 7 of 105 slices shown]
[im 1/105]
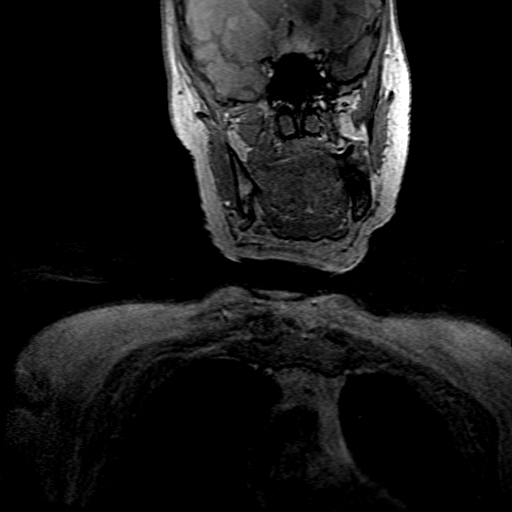
[im 18/105]
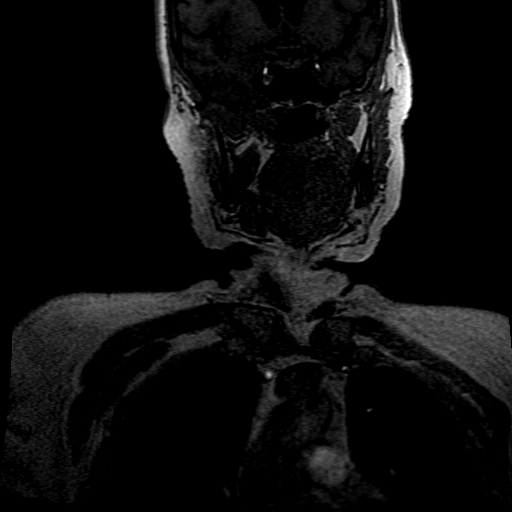
[im 35/105]
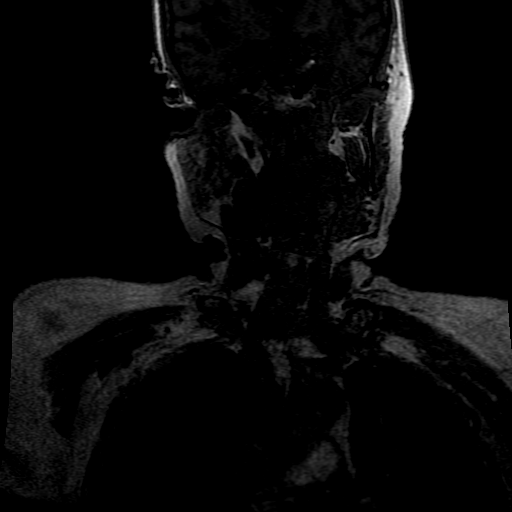
[im 53/105]
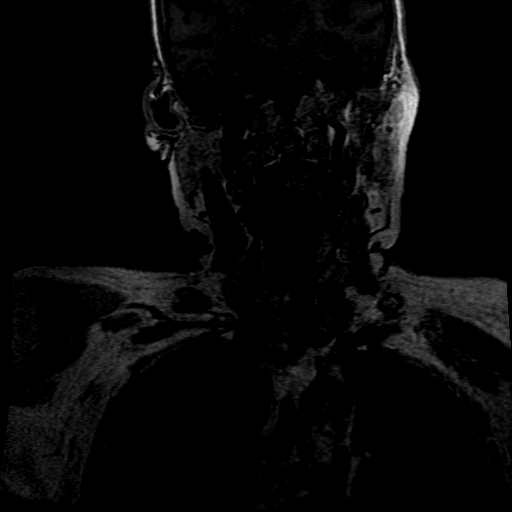
[im 70/105]
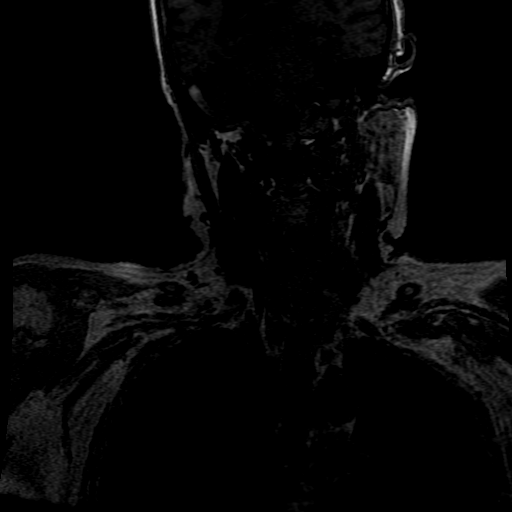
[im 87/105]
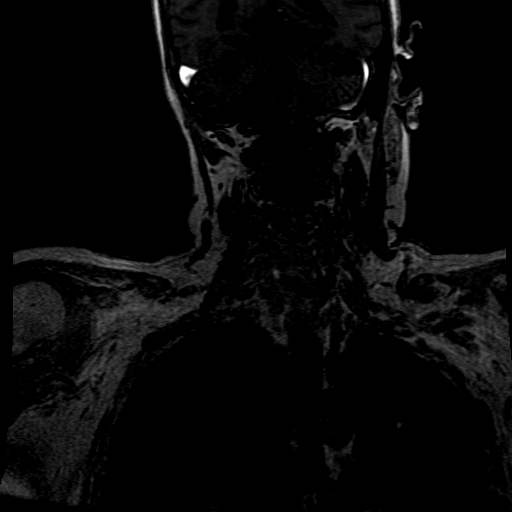
[im 105/105]
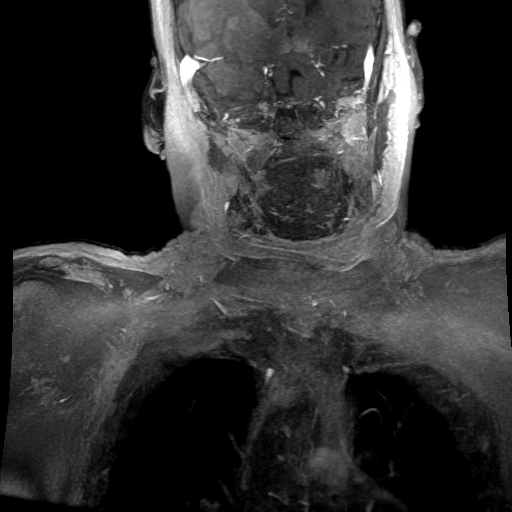

[Series 501: ph1/cor cemra ft · coronal · 1.2mm · 0.59mm/px · 4 of 104 slices shown]
[im 1/104]
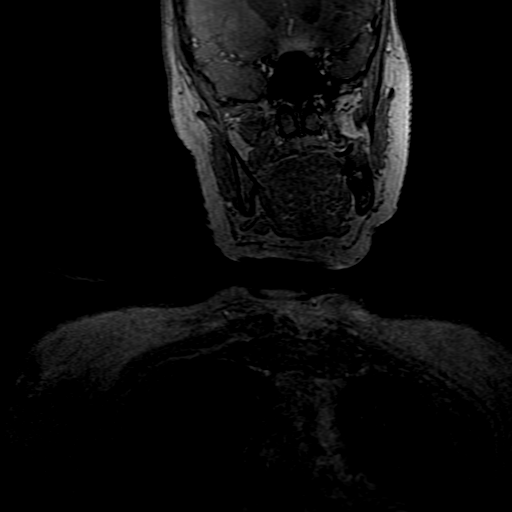
[im 18/104]
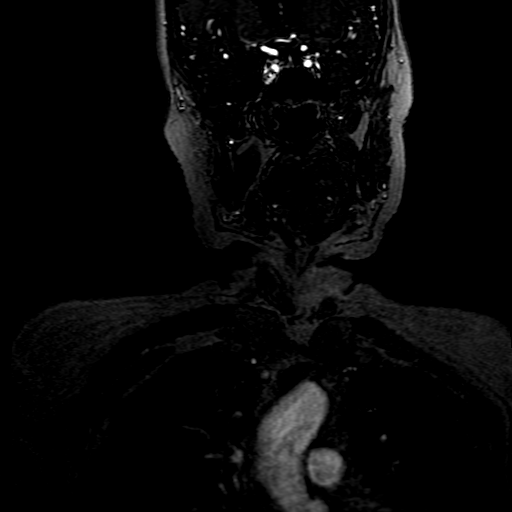
[im 52/104]
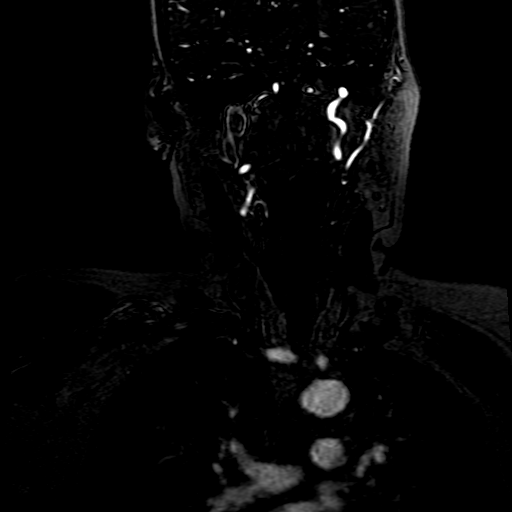
[im 86/104]
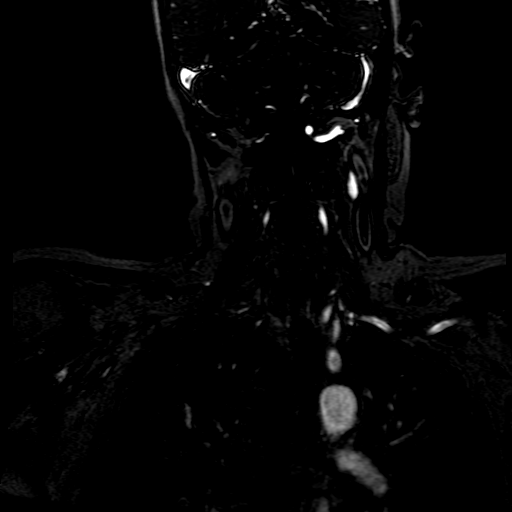

[Series 502: ph2/cor cemra ft · coronal · 1.2mm · 0.59mm/px · 3 of 104 slices shown]
[im 18/104]
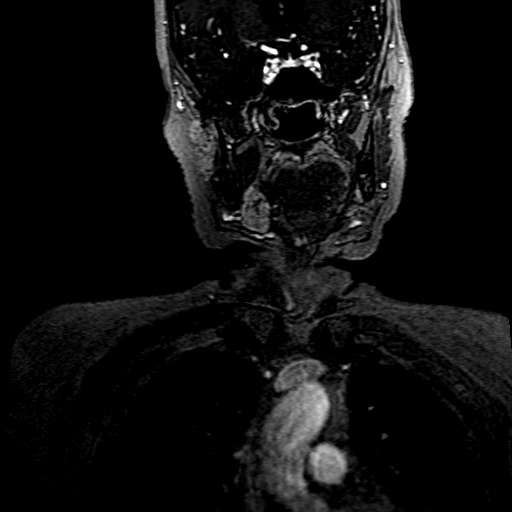
[im 52/104]
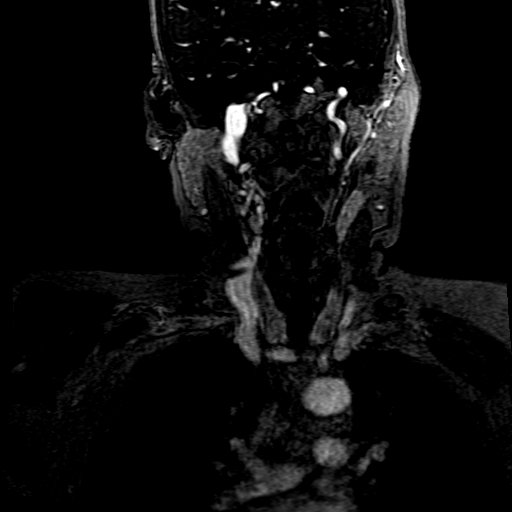
[im 86/104]
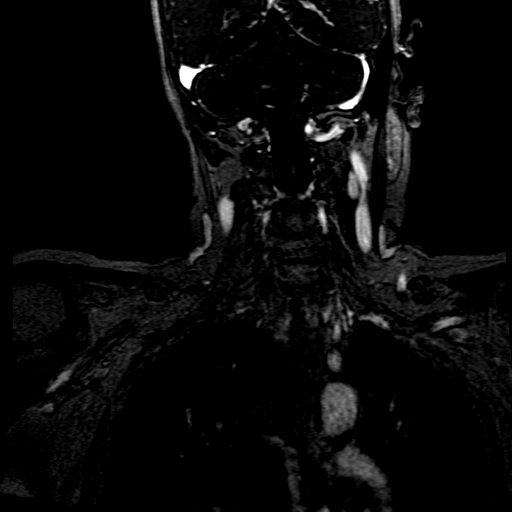

[((date))-((date)) · coronal · 1.2mm · 0.59mm/px · 3 of 105 slices shown]
[im 18/105]
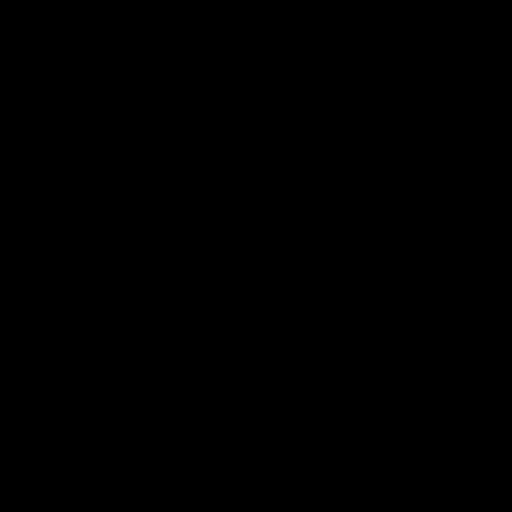
[im 53/105]
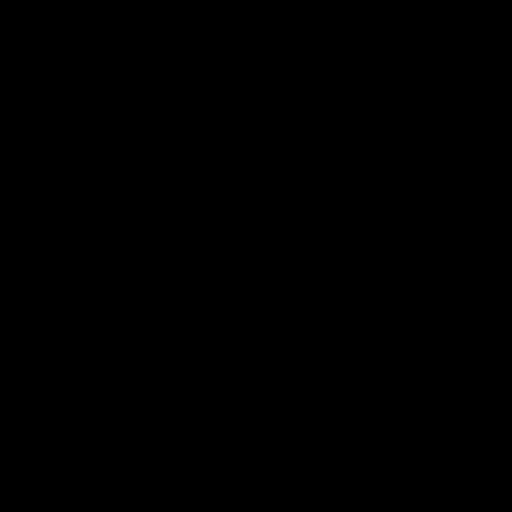
[im 87/105]
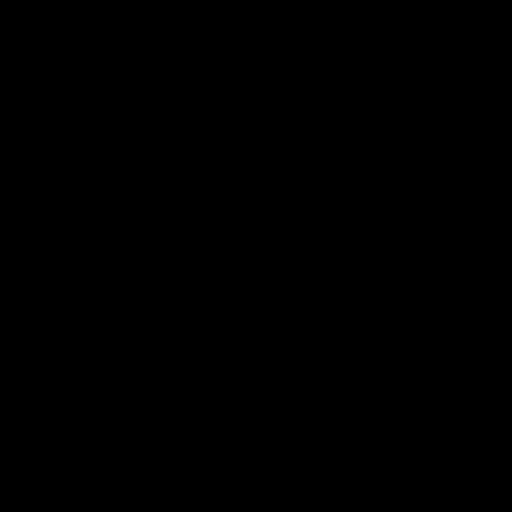

[17 of 48 positions shown; findings below may reference images not displayed]

FINDINGS: MRA HEAD FINDINGS

Anterior circulation: Both internal carotid arteries patent to the
termini without stenosis or other abnormality. A1 segments patent
bilaterally. Right A1 slightly hypoplastic. Normal anterior
communicating artery complex. Anterior cerebral arteries patent to
their distal aspects without stenosis. No M1 stenosis or occlusion.
Normal left MCA bifurcation. Evaluation of the right MCA bifurcation
somewhat limited by motion, but grossly within normal limits. Distal
MCA branches well perfused and symmetric.

Posterior circulation: Dominant left vertebral artery widely patent
to the vertebrobasilar junction. Hypoplastic right vertebral artery
largely terminates in PICA, although a tiny branches seen ascending
towards the vertebrobasilar junction on 3D time-of-flight sequence.
Both PICA patent. Basilar patent to its distal aspect without
stenosis. Superior cerebellar arteries patent bilaterally. Left PCA
supplied primarily via the basilar, although a tiny left PCOM is
noted. Right PCA supplied via a hypoplastic right P1 segment and
robust right posterior communicating artery. Both PCAs widely patent
to their distal aspects without stenosis.

Anatomic variants: Hypoplastic right vertebral artery largely
terminates in PICA. Mildly hypoplastic right A1 segment.

MRA NECK FINDINGS

Aortic arch: Visualized aortic arch normal in caliber with normal 3
vessel morphology. No hemodynamically significant stenosis seen
about the origin of the great vessels.

Right carotid system: Right CCA patent from its origin to the
bifurcation without stenosis. Mild atheromatous irregularity about
the right carotid bulb/proximal right ICA without significant
stenosis. Right ICA tortuous but otherwise patent without stenosis,
evidence for dissection or occlusion.

Left carotid system: Left CCA patent from its origin to the
bifurcation without stenosis. Mild atheromatous irregularity about
the left carotid bulb/proximal left ICA without significant
stenosis. Left ICA tortuous but widely patent distally without
stenosis, evidence for dissection or occlusion.

Vertebral arteries: Both vertebral arteries arise from the
subclavian arteries. No proximal subclavian artery stenosis. Left
vertebral artery dominant, with a diffusely hypoplastic right
vertebral artery. Vertebral arteries tortuous but are widely patent
without stenosis, evidence for dissection or occlusion.
Short-segment fenestration involving the left V3 segment noted
(series 506, image 11).

Other: None
IMPRESSION: 1. Negative MRA of the head and neck. No large vessel occlusion,
hemodynamically significant stenosis, or other acute vascular
abnormality.
2. Mild for age atheromatous change about the carotid bifurcations
without stenosis.

## 2023-07-17 ENCOUNTER — Ambulatory Visit (INDEPENDENT_AMBULATORY_CARE_PROVIDER_SITE_OTHER)

## 2023-07-17 DIAGNOSIS — I63412 Cerebral infarction due to embolism of left middle cerebral artery: Secondary | ICD-10-CM | POA: Diagnosis not present

## 2023-07-17 LAB — CUP PACEART REMOTE DEVICE CHECK
Date Time Interrogation Session: 20250702230931
Implantable Pulse Generator Implant Date: 20220826

## 2023-07-21 ENCOUNTER — Ambulatory Visit: Payer: Self-pay | Admitting: Cardiology

## 2023-08-04 NOTE — Progress Notes (Signed)
 [facility] Summary Report / Loop Recorder

## 2023-08-18 ENCOUNTER — Ambulatory Visit (INDEPENDENT_AMBULATORY_CARE_PROVIDER_SITE_OTHER)

## 2023-08-18 DIAGNOSIS — I63412 Cerebral infarction due to embolism of left middle cerebral artery: Secondary | ICD-10-CM | POA: Diagnosis not present

## 2023-08-18 LAB — CUP PACEART REMOTE DEVICE CHECK
Date Time Interrogation Session: 20250802230817
Implantable Pulse Generator Implant Date: 20220826

## 2023-08-20 ENCOUNTER — Ambulatory Visit: Payer: Self-pay | Admitting: Cardiology

## 2023-08-20 DIAGNOSIS — Z7409 Other reduced mobility: Secondary | ICD-10-CM | POA: Diagnosis present

## 2023-08-20 NOTE — Discharge Summary (Signed)
 ------------------------------------------------------------------------------- Attestation signed by [name], MD at 09/03/2023  3:53 PM I saw and evaluated the patient and reviewed the resident's note. I agree with the resident's findings and plan.   Time spent on discharge 36 minutes  Electronically Signed by:   Kinchit K. Fairly, MD FACP Assistant Professor of Medicine Diplomat of American Board of Internal Medicine [facility] Division of Medicine Department of Internal Medicine, Section of Hospital Medicine 09/03/2023 3:53 PM  -------------------------------------------------------------------------------  HP2 Discharge Summary  Name: Allison Hughes MRN: [id number] Age: 73 yrs DOB: Aug 04, 1950  Admit date: 08/16/2023 Discharge date and time: 09/01/2023  Admitting Physician: Alm Josue Sheffield, MD Discharge Physician: [name], MD  Admission Diagnoses:  Shortness of breath [R06.02] Hypoxia [R09.02] Acute cystitis with hematuria [N30.01] Acute hypoxemic respiratory failure    (CMD) [J96.01] Anemia, unspecified type [D64.9] Pneumonia due to infectious organism, unspecified laterality, unspecified part of lung [J18.9]   Discharge Diagnoses:  Acute hypoxic respiratory failure secondary to multifocal pneumonia and R parapneumonic pleural effusion Acute cystitis  Microcytic anemia Neuroendocrine tumor Type 2 diabetes  Admission Condition: poor Discharged Condition: good  Hospital Course:  For full details, please see H&P, progress notes, consult notes and ancillary notes. Briefly, 73 year old female PMH T2DM, HTN, recent UGIB, neuroendocrine tumor presenting with shortness of breath found to have acute hypoxic respiratory failure with multifocal pneumonia and cystitis. Hospital course will be addressed in a problem based format as below.  #Acute hypoxic respiratory failure #Multifocal pneumonia #R parapneumonic pleural effusion  Patient presented for evaluation on  8/2 with chief complaint of shortness of breath and generalized weakness, upon admission patient with new oxygen requirement and evidence of multifocal pneumonia on imaging.  She was admitted to [facility] for monitoring and started on ceftriaxone  and azithromycin .  During her stay patient had worsening oxygen requirement and was treated with IV diuresis for pulmonary edema and fluid overload, additionally patient underwent right-sided thoracentesis on 8/6 was consistent with exudative effusion, culture with no growth, cytology negative for malignancy.  Due to worsening oxygen requirement patient was escalated to cefepime.  Following this patient had improvement in oxygenation and patient completed 7-day course of cefepime on 8/11.  She had notable improvement in her symptoms and was able to be transitioned to room air prior to discharge.  #Acute cystitis On admission patient with generalized weakness and difficult historian however had been endorsing urinary frequency and urgency.  UA showing evidence of infection for which she was treated appropriately with cephalosporin antibiotics.  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 Hgb on discharge 7.5.  Patient to follow-up with Dr. Rollin shortly after discharge for management of gastric neuroendocrine tumor and IDA. Her antiplatelet medications were held during her hospitalization and at discharge until outpatient GI  evaluation.  #T2DM Patient has extremely uncontrolled diabetes with hemoglobin A1c undetectably high at >15%.  Patient had elevated blood sugars throughout admission and required up titration of basal and prandial insulin .  At discharge diabetic regimen will be Tresiba  35 units daily  and Humalog 12 units 3 times daily with meals with further uptitration by her outpatient PCP.  #Hospital acquired delirium  Patient noted to have alteration in mental status and mild confusion to place and situation.  She was started on quetiapine  25 mg nightly with quetiapine  12.5 mg as needed for agitation.  Acute altered mental status workup unremarkable with normal B12, folate, TSH, UA, and chest x-ray.  Delirium precautions were in place.  Her mentation improved throughout her hospitalization but she continued to refuse SNF placement. Due to this and odd remarks throughout her stay noted by her medical team and bedside RN, psychiatry was consulted to assess the patient's capacity. They deemed that she had capacity to make this decision to refuse SNF and thus the patient was subsequently discharged home with home health to assist with her medication management. Multiple discussions were had between the medical team and the patient's family regarding her discharge. Unfortunately, since the patient was deemed to have capacity to make this decision, her autonomy was respected and she was discharged home via arranged transport for further outpatient care with home health.   On day of discharge, patient is clinically stable with no new examination findings or acute symptoms compared to prior.  The patient was seen by the attending physician on the date of discharge and deemed stable and acceptable for discharge.  The patient's chronic medical conditions were treated accordingly per the patient's home medication regimen.  The patient's medication reconciliation, follow-up appointments, discharge orders, instructions, and  significant lab and diagnostic studies are as noted.    Medication changes: Hold aspirin  81 mg daily Hold Plavix  75 mg daily Modify Tresiba  to 35 units daily Start Humalog 12 units 3 times daily with meals  Discharge Follow-up Action Items: Follow up with PCP in 1 week following discharge  Obtain repeat CBC to ensure stability of her hemoglobin  Consider referral to neurology for formal cognitive eval  See incidental findings as noted below  Follow-up with GI outpatient with Dr. Rollin for neuroendocrine tumor and GI bleed Incidental findings: RLL lung nodule, repeat chest CT in 6-12 months TTE with evidence of prolonged relaxation and normal EF, PCP to follow-up and consider Cardiology referral   Patient's Ordered Code Status: DNAR per Portable DNAR   Consults: WOUND OSTOMY EVAL AND TREAT IP CONSULT TO SPIRITUAL CARE IP CONSULT TO GASTROENTEROLOGY IP CONSULT TO PSYCHIATRY  Disposition: Home with home health   Patient Instructions:    Medication List     PAUSE taking these medications    aspirin  81 mg EC tablet Wait to take this until your doctor or other care provider tells you to start again. Take 81 mg by mouth daily.   clopidogreL  75 mg tablet Wait to take this until your doctor or other care provider tells you to start again. Commonly known as: PLAVIX  Take 75 mg by mouth daily.       START taking these medications    diabetic supplies, miscellan. Misc Pen Needles: 6 mm, Inject insulin  4 times a day, ICD-10 Code: Diabetes Diagnosis Code: Type 2 - E11.9   insulin  lispro 100 unit/mL KwikPen Commonly known as: HumaLOG KwikPen Inject 12 Units under the skin 3 (three) times a day before meals.       CHANGE how you take these medications    insulin  degludec 100 unit/mL injection Commonly known as: Tresiba  U-100 Insulin  Inject 35 Units under the skin every evening. What changed: how much to take  CONTINUE taking these medications     acetaminophen  325 mg tablet Commonly known as: TYLENOL  Take 2 tablets (650 mg total) by mouth every 6 (six) hours as needed (Fever GREATER THAN 100.4 F(38 C)).   ALPRAZolam  1 mg tablet Commonly known as: XANAX  Take 0.5 tablets (0.5 mg total) by mouth nightly as needed for anxiety.   atorvastatin  40 mg tablet Commonly known as: LIPITOR Take 40 mg by mouth daily.   Breztri Aerosphere 160-9-4.8 mcg/actuation inhaler Generic drug: budesonide -glycopyr-formoterol  Inhale 2 puffs 2 (two) times a day.   lisinopriL  20 mg tablet Commonly known as: PRINIVIL  Take 20 mg by mouth daily.   metFORMIN  500 mg 24 hr tablet Commonly known as: GLUCOPHAGE -XR Take 500 mg by mouth daily.   omeprazole 40 mg DR capsule Commonly known as: PriLOSEC Take 1 capsule (40 mg total) by mouth daily.   Ozempic 1 mg/dose (4 mg/3 mL) subcutaneous pen injector Generic drug: semaglutide Inject 1 mg under the skin every 7 days.   QUEtiapine  25 mg tablet Commonly known as: SEROquel  Take 1 tablet (25 mg total) by mouth at bedtime.         Where to Get Your Medications     These medications were sent to [facility]  [address] [place] [address]    Hours: Mon-Fri 8:30am-5pm; Sat-Sun: Closed; Holidays: Closed Phone: [phone]  acetaminophen  325 mg tablet diabetic supplies, miscellan. Misc insulin  lispro 100 unit/mL KwikPen    Information about where to get these medications is not yet available   Ask your nurse or doctor about these medications insulin  degludec 100 unit/mL injection      Discharge Orders     Ambulatory referral to Gastroenterology     Details:    Reason for Referral: General GI Consult   Comments: Dr. Rollin specifically   Ambulatory referral to PCP     Comments: Gaither Gell, MD   CNA Home Health Coordination     DNAR per Portable DNAR     Home Health Skilled Nursing     Lifting Limits:     Details:    Lifting Limits: No lifting limits    Occupational Therapy Home Health Coordination     Physical Therapy Home Health Coordination     Social Worker Home Health Coordination     Details:    Actions: Assess for Community Services       Follow-up  No future appointments.  Electronically signed by: Worth Austin, DO 09/01/2023 3:30 PM

## 2023-08-31 DIAGNOSIS — R419 Unspecified symptoms and signs involving cognitive functions and awareness: Secondary | ICD-10-CM | POA: Diagnosis present

## 2023-09-01 DIAGNOSIS — C7A8 Other malignant neuroendocrine tumors: Secondary | ICD-10-CM | POA: Diagnosis present

## 2023-09-02 NOTE — Telephone Encounter (Signed)
 I got request from RN about this patient's friend Mr. Rebbecca needing her to go to SNF against her will? The RN and involved MSW also requested that I call patient's friend for this request.   I called patient's friend Mr. Rebbecca for this and apparently he didn't want patient to go to SNF but they just want to make sure patient has a RN coming to help with medicines at Home.   We have already arranged [facility] services on [place] and they will be visiting patient at her home and Mr. Rebbecca will be present.   Overll Mr. [name] told me that patient is doing pretty well and he did not have any concerns related to patient.    Kinchit K. Maree, MD FACP Assistant Professor of Medicine Diplomat of American Board of Internal Medicine [facility] of Medicine Department of Internal Medicine, Section of Hospital Medicine 09/02/2023 7:32 PM

## 2023-09-18 ENCOUNTER — Ambulatory Visit

## 2023-09-18 DIAGNOSIS — I63412 Cerebral infarction due to embolism of left middle cerebral artery: Secondary | ICD-10-CM | POA: Diagnosis not present

## 2023-09-18 LAB — CUP PACEART REMOTE DEVICE CHECK
Date Time Interrogation Session: 20250903232204
Implantable Pulse Generator Implant Date: 20220826

## 2023-09-19 ENCOUNTER — Ambulatory Visit: Payer: Self-pay | Admitting: Cardiology

## 2023-09-19 ENCOUNTER — Emergency Department (HOSPITAL_COMMUNITY)

## 2023-09-19 ENCOUNTER — Inpatient Hospital Stay (HOSPITAL_COMMUNITY)
Admission: EM | Admit: 2023-09-19 | Discharge: 2023-09-24 | DRG: 377 | Disposition: A | Discharge status: Skilled Nursing Facility | Attending: Internal Medicine | Admitting: Internal Medicine

## 2023-09-19 ENCOUNTER — Encounter (HOSPITAL_COMMUNITY): Payer: Self-pay

## 2023-09-19 ENCOUNTER — Other Ambulatory Visit: Payer: Self-pay

## 2023-09-19 DIAGNOSIS — Z7409 Other reduced mobility: Secondary | ICD-10-CM | POA: Diagnosis not present

## 2023-09-19 DIAGNOSIS — B961 Klebsiella pneumoniae [K. pneumoniae] as the cause of diseases classified elsewhere: Secondary | ICD-10-CM | POA: Diagnosis present

## 2023-09-19 DIAGNOSIS — J189 Pneumonia, unspecified organism: Secondary | ICD-10-CM | POA: Diagnosis present

## 2023-09-19 DIAGNOSIS — Z833 Family history of diabetes mellitus: Secondary | ICD-10-CM

## 2023-09-19 DIAGNOSIS — D649 Anemia, unspecified: Secondary | ICD-10-CM

## 2023-09-19 DIAGNOSIS — F03C0 Unspecified dementia, severe, without behavioral disturbance, psychotic disturbance, mood disturbance, and anxiety: Secondary | ICD-10-CM | POA: Diagnosis present

## 2023-09-19 DIAGNOSIS — E1165 Type 2 diabetes mellitus with hyperglycemia: Secondary | ICD-10-CM | POA: Diagnosis not present

## 2023-09-19 DIAGNOSIS — K760 Fatty (change of) liver, not elsewhere classified: Secondary | ICD-10-CM | POA: Diagnosis present

## 2023-09-19 DIAGNOSIS — I69320 Aphasia following cerebral infarction: Secondary | ICD-10-CM | POA: Diagnosis not present

## 2023-09-19 DIAGNOSIS — E119 Type 2 diabetes mellitus without complications: Secondary | ICD-10-CM | POA: Diagnosis present

## 2023-09-19 DIAGNOSIS — D62 Acute posthemorrhagic anemia: Secondary | ICD-10-CM | POA: Diagnosis present

## 2023-09-19 DIAGNOSIS — Z79899 Other long term (current) drug therapy: Secondary | ICD-10-CM

## 2023-09-19 DIAGNOSIS — Z7982 Long term (current) use of aspirin: Secondary | ICD-10-CM

## 2023-09-19 DIAGNOSIS — I5032 Chronic diastolic (congestive) heart failure: Secondary | ICD-10-CM | POA: Diagnosis present

## 2023-09-19 DIAGNOSIS — R419 Unspecified symptoms and signs involving cognitive functions and awareness: Secondary | ICD-10-CM | POA: Diagnosis present

## 2023-09-19 DIAGNOSIS — Z638 Other specified problems related to primary support group: Secondary | ICD-10-CM

## 2023-09-19 DIAGNOSIS — I10 Essential (primary) hypertension: Secondary | ICD-10-CM | POA: Diagnosis present

## 2023-09-19 DIAGNOSIS — J9601 Acute respiratory failure with hypoxia: Secondary | ICD-10-CM | POA: Insufficient documentation

## 2023-09-19 DIAGNOSIS — R296 Repeated falls: Secondary | ICD-10-CM | POA: Diagnosis present

## 2023-09-19 DIAGNOSIS — K644 Residual hemorrhoidal skin tags: Secondary | ICD-10-CM | POA: Diagnosis present

## 2023-09-19 DIAGNOSIS — I110 Hypertensive heart disease with heart failure: Secondary | ICD-10-CM | POA: Diagnosis present

## 2023-09-19 DIAGNOSIS — Z7984 Long term (current) use of oral hypoglycemic drugs: Secondary | ICD-10-CM

## 2023-09-19 DIAGNOSIS — K922 Gastrointestinal hemorrhage, unspecified: Secondary | ICD-10-CM

## 2023-09-19 DIAGNOSIS — E785 Hyperlipidemia, unspecified: Secondary | ICD-10-CM | POA: Diagnosis present

## 2023-09-19 DIAGNOSIS — D3A8 Other benign neuroendocrine tumors: Secondary | ICD-10-CM | POA: Diagnosis present

## 2023-09-19 DIAGNOSIS — E66811 Obesity, class 1: Secondary | ICD-10-CM | POA: Diagnosis present

## 2023-09-19 DIAGNOSIS — Z6832 Body mass index (BMI) 32.0-32.9, adult: Secondary | ICD-10-CM

## 2023-09-19 DIAGNOSIS — F17210 Nicotine dependence, cigarettes, uncomplicated: Secondary | ICD-10-CM | POA: Diagnosis present

## 2023-09-19 DIAGNOSIS — N3000 Acute cystitis without hematuria: Secondary | ICD-10-CM | POA: Diagnosis present

## 2023-09-19 DIAGNOSIS — G9341 Metabolic encephalopathy: Secondary | ICD-10-CM | POA: Diagnosis present

## 2023-09-19 DIAGNOSIS — Z888 Allergy status to other drugs, medicaments and biological substances status: Secondary | ICD-10-CM

## 2023-09-19 DIAGNOSIS — Z634 Disappearance and death of family member: Secondary | ICD-10-CM

## 2023-09-19 DIAGNOSIS — Z1152 Encounter for screening for COVID-19: Secondary | ICD-10-CM

## 2023-09-19 DIAGNOSIS — R4189 Other symptoms and signs involving cognitive functions and awareness: Secondary | ICD-10-CM | POA: Diagnosis present

## 2023-09-19 DIAGNOSIS — F419 Anxiety disorder, unspecified: Secondary | ICD-10-CM | POA: Diagnosis not present

## 2023-09-19 DIAGNOSIS — Z789 Other specified health status: Secondary | ICD-10-CM | POA: Diagnosis present

## 2023-09-19 DIAGNOSIS — R4182 Altered mental status, unspecified: Principal | ICD-10-CM

## 2023-09-19 DIAGNOSIS — K8020 Calculus of gallbladder without cholecystitis without obstruction: Secondary | ICD-10-CM | POA: Diagnosis present

## 2023-09-19 DIAGNOSIS — Z794 Long term (current) use of insulin: Secondary | ICD-10-CM | POA: Diagnosis not present

## 2023-09-19 DIAGNOSIS — C7A8 Other malignant neuroendocrine tumors: Secondary | ICD-10-CM | POA: Diagnosis present

## 2023-09-19 DIAGNOSIS — Z7985 Long-term (current) use of injectable non-insulin antidiabetic drugs: Secondary | ICD-10-CM

## 2023-09-19 DIAGNOSIS — Z716 Tobacco abuse counseling: Secondary | ICD-10-CM

## 2023-09-19 DIAGNOSIS — K254 Chronic or unspecified gastric ulcer with hemorrhage: Secondary | ICD-10-CM | POA: Diagnosis present

## 2023-09-19 DIAGNOSIS — J440 Chronic obstructive pulmonary disease with acute lower respiratory infection: Secondary | ICD-10-CM | POA: Diagnosis present

## 2023-09-19 DIAGNOSIS — Z7902 Long term (current) use of antithrombotics/antiplatelets: Secondary | ICD-10-CM

## 2023-09-19 DIAGNOSIS — I509 Heart failure, unspecified: Secondary | ICD-10-CM

## 2023-09-19 DIAGNOSIS — Z91013 Allergy to seafood: Secondary | ICD-10-CM

## 2023-09-19 LAB — IRON AND TIBC
Iron: <10 ug/dL — ABNORMAL LOW (ref 28–170)
TIBC: 361 ug/dL (ref 250–450)

## 2023-09-19 LAB — FERRITIN: Ferritin: 13 ng/mL (ref 11–307)

## 2023-09-19 LAB — URINALYSIS, ROUTINE W REFLEX MICROSCOPIC
Bilirubin Urine: NEGATIVE
Glucose, UA: NEGATIVE mg/dL
Hgb urine dipstick: NEGATIVE
Ketones, ur: NEGATIVE mg/dL
Nitrite: POSITIVE — AB
Protein, ur: NEGATIVE mg/dL
Specific Gravity, Urine: 1.027 (ref 1.005–1.030)
WBC, UA: >50 WBC/hpf (ref 0–5)
pH: 5 (ref 5.0–8.0)

## 2023-09-19 LAB — I-STAT CHEM 8, ED
BUN: 10 mg/dL (ref 8–23)
Calcium, Ion: 1.1 mmol/L — ABNORMAL LOW (ref 1.15–1.40)
Chloride: 109 mmol/L (ref 98–111)
Creatinine, Ser: 0.8 mg/dL (ref 0.44–1.00)
Glucose, Bld: 101 mg/dL — ABNORMAL HIGH (ref 70–99)
HCT: 25 % — ABNORMAL LOW (ref 36.0–46.0)
Hemoglobin: 8.5 g/dL — ABNORMAL LOW (ref 12.0–15.0)
Potassium: 4.1 mmol/L (ref 3.5–5.1)
Sodium: 141 mmol/L (ref 135–145)
TCO2: 22 mmol/L (ref 22–32)

## 2023-09-19 LAB — CBC WITH DIFFERENTIAL/PLATELET
Basophils Absolute: 0 K/uL (ref 0.0–0.1)
Basophils Relative: 0 %
Eosinophils Absolute: 0.1 K/uL (ref 0.0–0.5)
Eosinophils Relative: 1 %
HCT: 24.9 % — ABNORMAL LOW (ref 36.0–46.0)
Hemoglobin: 6.6 g/dL — CL (ref 12.0–15.0)
Lymphocytes Relative: 15 %
Lymphs Abs: 1.3 K/uL (ref 0.7–4.0)
MCH: 19.4 pg — ABNORMAL LOW (ref 26.0–34.0)
MCHC: 26.5 g/dL — ABNORMAL LOW (ref 30.0–36.0)
MCV: 73.2 fL — ABNORMAL LOW (ref 80.0–100.0)
Monocytes Absolute: 0.3 K/uL (ref 0.1–1.0)
Monocytes Relative: 3 %
Neutro Abs: 6.8 K/uL (ref 1.7–7.7)
Neutrophils Relative %: 81 %
Platelets: 460 K/uL — ABNORMAL HIGH (ref 150–400)
RBC: 3.4 MIL/uL — ABNORMAL LOW (ref 3.87–5.11)
RDW: 22 % — ABNORMAL HIGH (ref 11.5–15.5)
WBC: 8.4 K/uL (ref 4.0–10.5)
nRBC: 0 % (ref 0.0–0.2)

## 2023-09-19 LAB — AMMONIA: Ammonia: <13 umol/L (ref 9–35)

## 2023-09-19 LAB — RETICULOCYTES
Immature Retic Fract: 35.1 % — ABNORMAL HIGH (ref 2.3–15.9)
RBC.: 3.18 MIL/uL — ABNORMAL LOW (ref 3.87–5.11)
Retic Count, Absolute: 49 K/uL (ref 19.0–186.0)
Retic Ct Pct: 1.5 % (ref 0.4–3.1)

## 2023-09-19 LAB — RAPID URINE DRUG SCREEN, HOSP PERFORMED
Amphetamines: NOT DETECTED
Barbiturates: NOT DETECTED
Benzodiazepines: POSITIVE — AB
Cocaine: NOT DETECTED
Opiates: NOT DETECTED
Tetrahydrocannabinol: NOT DETECTED

## 2023-09-19 LAB — I-STAT VENOUS BLOOD GAS, ED
Acid-base deficit: 1 mmol/L (ref 0.0–2.0)
Bicarbonate: 23 mmol/L (ref 20.0–28.0)
Calcium, Ion: 1.11 mmol/L — ABNORMAL LOW (ref 1.15–1.40)
HCT: 22 % — ABNORMAL LOW (ref 36.0–46.0)
Hemoglobin: 7.5 g/dL — ABNORMAL LOW (ref 12.0–15.0)
O2 Saturation: 72 %
Potassium: 3.7 mmol/L (ref 3.5–5.1)
Sodium: 142 mmol/L (ref 135–145)
TCO2: 24 mmol/L (ref 22–32)
pCO2, Ven: 34.8 mmHg — ABNORMAL LOW (ref 44–60)
pH, Ven: 7.429 (ref 7.25–7.43)
pO2, Ven: 36 mmHg (ref 32–45)

## 2023-09-19 LAB — VITAMIN B12: Vitamin B-12: 432 pg/mL (ref 180–914)

## 2023-09-19 LAB — COMPREHENSIVE METABOLIC PANEL WITH GFR
ALT: 28 U/L (ref 0–44)
AST: 20 U/L (ref 15–41)
Albumin: 2.8 g/dL — ABNORMAL LOW (ref 3.5–5.0)
Alkaline Phosphatase: 138 U/L — ABNORMAL HIGH (ref 38–126)
Anion gap: 11 (ref 5–15)
BUN: 10 mg/dL (ref 8–23)
CO2: 23 mmol/L (ref 22–32)
Calcium: 8.7 mg/dL — ABNORMAL LOW (ref 8.9–10.3)
Chloride: 107 mmol/L (ref 98–111)
Creatinine, Ser: 0.87 mg/dL (ref 0.44–1.00)
GFR, Estimated: >60 mL/min (ref >60)
Glucose, Bld: 114 mg/dL — ABNORMAL HIGH (ref 70–99)
Potassium: 3.7 mmol/L (ref 3.5–5.1)
Sodium: 141 mmol/L (ref 135–145)
Total Bilirubin: 0.7 mg/dL (ref 0.0–1.2)
Total Protein: 6.6 g/dL (ref 6.5–8.1)

## 2023-09-19 LAB — ETHANOL: Alcohol, Ethyl (B): <15 mg/dL (ref <15)

## 2023-09-19 LAB — PREPARE RBC (CROSSMATCH)

## 2023-09-19 LAB — RESP PANEL BY RT-PCR (RSV, FLU A&B, COVID)  RVPGX2
Influenza A by PCR: NEGATIVE
Influenza B by PCR: NEGATIVE
Resp Syncytial Virus by PCR: NEGATIVE
SARS Coronavirus 2 by RT PCR: NEGATIVE

## 2023-09-19 LAB — BRAIN NATRIURETIC PEPTIDE: B Natriuretic Peptide: 78.7 pg/mL (ref 0.0–100.0)

## 2023-09-19 LAB — TROPONIN I (HIGH SENSITIVITY)
Troponin I (High Sensitivity): 8 ng/L (ref <18)
Troponin I (High Sensitivity): 9 ng/L (ref <18)

## 2023-09-19 LAB — POC OCCULT BLOOD, ED: Fecal Occult Bld: POSITIVE — AB

## 2023-09-19 LAB — CBG MONITORING, ED: Glucose-Capillary: 97 mg/dL (ref 70–99)

## 2023-09-19 LAB — LIPASE, BLOOD: Lipase: 22 U/L (ref 11–51)

## 2023-09-19 LAB — CK: Total CK: 31 U/L — ABNORMAL LOW (ref 38–234)

## 2023-09-19 LAB — FOLATE: Folate: 19.4 ng/mL (ref >5.9)

## 2023-09-19 MED ORDER — PANTOPRAZOLE SODIUM 40 MG IV SOLR
40.0000 mg | Freq: Once | INTRAVENOUS | Status: AC
  Administered 2023-09-19: 40 mg via INTRAVENOUS
  Filled 2023-09-19: qty 10

## 2023-09-19 MED ORDER — IOHEXOL 350 MG/ML SOLN
75.0000 mL | Freq: Once | INTRAVENOUS | Status: AC | PRN
  Administered 2023-09-19: 75 mL via INTRAVENOUS

## 2023-09-19 MED ORDER — SODIUM CHLORIDE 0.9 % IV SOLN
500.0000 mg | Freq: Once | INTRAVENOUS | Status: AC
  Administered 2023-09-19: 500 mg via INTRAVENOUS
  Filled 2023-09-19: qty 5

## 2023-09-19 MED ORDER — SODIUM CHLORIDE 0.9% IV SOLUTION: Freq: Once | INTRAVENOUS | Status: AC

## 2023-09-19 MED ORDER — IPRATROPIUM-ALBUTEROL 0.5-2.5 (3) MG/3ML IN SOLN
3.0000 mL | Freq: Once | RESPIRATORY_TRACT | Status: AC
  Administered 2023-09-19: 3 mL via RESPIRATORY_TRACT
  Filled 2023-09-19: qty 3

## 2023-09-19 MED ORDER — FUROSEMIDE 10 MG/ML IJ SOLN
40.0000 mg | Freq: Once | INTRAMUSCULAR | Status: AC
  Administered 2023-09-19: 40 mg via INTRAVENOUS
  Filled 2023-09-19: qty 4

## 2023-09-19 MED ORDER — SODIUM CHLORIDE 0.9 % IV SOLN
1.0000 g | Freq: Once | INTRAVENOUS | Status: AC
  Administered 2023-09-19: 1 g via INTRAVENOUS
  Filled 2023-09-19: qty 10

## 2023-09-19 NOTE — ED Triage Notes (Signed)
 Pt bib [facility] from home c/o friends found pt laying on floor. Room air sats 88% with diminished breath sounds. A&Ox2. Ems gave 2 duonebs and SPO2 is 95%. Friends say she has not had a shower in three weeks. Pt lives alone

## 2023-09-19 NOTE — ED Provider Notes (Addendum)
 [place] EMERGENCY DEPARTMENT AT [facility] Provider Note  CSN: [id number] Arrival date & time: 09/19/23 1749  Chief Complaint(s) Altered Mental Status and Shortness of Breath  HPI Allison Hughes is a 73 y.o. female with past medical history as below, significant for CVA, aphasia, type II DM, HLD, HTN, hypertension who presents to the ED with complaint of hypoxia, AMS  Patient lives with friends or at least nearby to friends for concern patient has been having increased fatigue over the past week or so.  Nausea with occasional emesis, poor p.o. intake.  Worsening difficulty breathing.  Per EMS patient's friend found her lying on the floor, reduced responsiveness, she was hypoxic and somewhat confused, on EMS arrival pulse ox is 88%, she was given nebulized breathing treatments and her status greatly improved.  Patient is a very poor historian, she reports increased fatigue but otherwise denies any acute complaints at this time.  She attributes her recent decline in functional status secondary to drinking too much muscadine juice. Denies ETOH  Past Medical History Past Medical History:  Diagnosis Date   Acute pyelonephritis 02/02/2016   Diabetes mellitus 01/15/2004   T2DM   Hyperlipidemia    Hypertension    Obesity (BMI 30-39.9)    Pneumonia 09/06/2020   Sepsis (HCC) 02/02/2016   Stroke [facility])    Patient Active Problem List   Diagnosis Date Noted   Symptomatic anemia 09/02/2022   Aphasia 06/03/2022   History of CVA (cerebrovascular accident) 09/06/2020   Type 2 diabetes mellitus without complication, without long-term current use of insulin  (HCC) 11/20/2018   Hypokalemia 02/02/2016   Anxiety 02/02/2016   Essential hypertension 02/02/2016   Home Medication(s) Prior to Admission medications   Medication Sig Start Date End Date Taking? Authorizing Provider  albuterol  (VENTOLIN  HFA) 108 (90 Base) MCG/ACT inhaler Inhale 2 puffs into the lungs every 6 (six) hours as  needed for wheezing or shortness of breath. 06/06/22   Ghimire, Donalda HERO, MD  ALPRAZolam  (XANAX ) 1 MG tablet Take 1 mg by mouth 2 (two) times daily as needed for anxiety or sleep. For anxiety and sleep.    [provider]  aspirin  EC 81 MG tablet Take 81 mg by mouth daily.    [provider]  atorvastatin  (LIPITOR) 40 MG tablet Take 40 mg by mouth daily. 08/06/18   [provider]  clopidogrel  (PLAVIX ) 75 MG tablet Take 1 tablet (75 mg total) by mouth daily. 06/06/22   Ghimire, Donalda HERO, MD  Cyanocobalamin  (VITAMIN B-12) 3000 MCG/ML LIQD Place 1,500 mcg under the tongue daily.    [provider]  ferrous sulfate  325 (65 FE) MG tablet Take 1 tablet (325 mg total) by mouth 2 (two) times daily with a meal for 30 days then as directed by MD 09/03/22   Dennise Lavada POUR, MD  folic acid  (FOLVITE ) 1 MG tablet Take 1 tablet (1 mg total) by mouth daily. 09/03/22   Dennise Lavada POUR, MD  lisinopril  (PRINIVIL ,ZESTRIL ) 20 MG tablet Take 20 mg by mouth at bedtime.    [provider]  metFORMIN  (GLUCOPHAGE -XR) 500 MG 24 hr tablet Take 500 mg by mouth at bedtime. 10/03/20   [provider]  Omega-3 Fatty Acids (FISH OIL PO) Take 1 capsule by mouth at bedtime.    [provider]  OZEMPIC, 1 MG/DOSE, 4 MG/3ML SOPN Inject 1 mg into the skin every Saturday. 05/21/22   [provider]  pantoprazole  (PROTONIX ) 40 MG tablet Take 1 tablet (40 mg  total) by mouth daily. 09/03/22   Singh, Prashant K, MD  TRESIBA  FLEXTOUCH 100 UNIT/ML FlexTouch Pen Inject 12 Units into the skin daily. Patient taking differently: Inject 8-10 Units into the skin daily as needed (high blood sugar). 06/06/22   Ghimire, Donalda HERO, MD                                                                                                                                    Past Surgical History Past Surgical History:  Procedure Laterality Date   CESAREAN SECTION     LOOP RECORDER INSERTION  N/A 09/08/2020   Procedure: LOOP RECORDER INSERTION;  Surgeon: Inocencio Soyla Lunger, MD;  Location: [facility] INVASIVE CV LAB;  Service: Cardiovascular;  Laterality: N/A;   ORIF ANKLE FRACTURE  05/11/2011   Procedure: OPEN REDUCTION INTERNAL FIXATION (ORIF) ANKLE FRACTURE;  Surgeon: Jerona LULLA Sage, MD;  Location: [facility] OR;  Service: Orthopedics;  Laterality: Right;   TUBAL LIGATION     Family History Family History  Problem Relation Age of Onset   Drug abuse Daughter    Diabetes Daughter     Social History Social History   Tobacco Use   Smoking status: Every Day    Current packs/day: 1.00    Average packs/day: 1 pack/day for 40.0 years (40.0 ttl pk-yrs)    Types: Cigarettes   Smokeless tobacco: Former    Quit date: 05/11/2011  Substance Use Topics   Alcohol use: No   Drug use: No   Allergies Iodine and Shrimp [shellfish allergy]  Review of Systems A thorough review of systems was obtained and all systems are negative except as noted in the HPI and PMH.   Physical Exam Vital Signs  I have reviewed the triage vital signs BP (!) 126/59   Pulse 89   Temp 98.1 F (36.7 C) (Oral)   Resp 20   SpO2 100%  Physical Exam Vitals and nursing note reviewed. Exam conducted with a chaperone present (amy richardson nt).  Constitutional:      General: She is not in acute distress.    Appearance: Normal appearance. She is well-developed. She is obese. She is not ill-appearing.  HENT:     Head: Normocephalic and atraumatic.     Right Ear: External ear normal.     Left Ear: External ear normal.     Nose: Nose normal.     Mouth/Throat:     Mouth: Mucous membranes are moist.  Eyes:     General: No scleral icterus.       Right eye: No discharge.        Left eye: No discharge.     Extraocular Movements: Extraocular movements intact.     Pupils: Pupils are equal, round, and reactive to light.  Cardiovascular:     Rate and Rhythm: Normal rate.  Pulmonary:     Effort: Pulmonary effort is normal.  Tachypnea present. No respiratory distress.  Breath sounds: No stridor. Wheezing present.  Abdominal:     General: Abdomen is flat. There is no distension.     Tenderness: There is no guarding.  Genitourinary:    Comments: Nonthrombosed external hemorrhoids, no frank bleeding, no melena, brown stool in rectal vault Musculoskeletal:        General: No deformity.     Cervical back: No rigidity.  Skin:    General: Skin is warm and dry.     Coloration: Skin is not cyanotic, jaundiced or pale.  Neurological:     Mental Status: She is alert. She is disoriented.     Cranial Nerves: Dysarthria present.     Sensory: Sensation is intact.     Motor: Motor function is intact.     Coordination: Coordination is intact. Coordination normal.     Comments: Gait testing deferred secondary to patient safety. Slight dysarthria , chronic per pt   Psychiatric:        Speech: Speech normal.        Behavior: Behavior normal. Behavior is cooperative.     ED Results and Treatments Labs (all labs ordered are listed, but only abnormal results are displayed) Labs Reviewed  COMPREHENSIVE METABOLIC PANEL WITH GFR - Abnormal; Notable for the following components:      Result Value   Glucose, Bld 114 (*)    Calcium  8.7 (*)    Albumin 2.8 (*)    Alkaline Phosphatase 138 (*)    All other components within normal limits  URINALYSIS, ROUTINE W REFLEX MICROSCOPIC - Abnormal; Notable for the following components:   APPearance HAZY (*)    Nitrite POSITIVE (*)    Leukocytes,Ua SMALL (*)    Bacteria, UA RARE (*)    All other components within normal limits  CK - Abnormal; Notable for the following components:   Total CK 31 (*)    All other components within normal limits  CBC WITH DIFFERENTIAL/PLATELET - Abnormal; Notable for the following components:   RBC 3.40 (*)    Hemoglobin 6.6 (*)    HCT 24.9 (*)    MCV 73.2 (*)    MCH 19.4 (*)    MCHC 26.5 (*)    RDW 22.0 (*)    Platelets 460 (*)    All  other components within normal limits  RAPID URINE DRUG SCREEN, HOSP PERFORMED - Abnormal; Notable for the following components:   Benzodiazepines POSITIVE (*)    All other components within normal limits  IRON  AND TIBC - Abnormal; Notable for the following components:   Iron  <10 (*)    All other components within normal limits  RETICULOCYTES - Abnormal; Notable for the following components:   RBC. 3.18 (*)    Immature Retic Fract 35.1 (*)    All other components within normal limits  I-STAT CHEM 8, ED - Abnormal; Notable for the following components:   Glucose, Bld 101 (*)    Calcium , Ion 1.10 (*)    Hemoglobin 8.5 (*)    HCT 25.0 (*)    All other components within normal limits  I-STAT VENOUS BLOOD GAS, ED - Abnormal; Notable for the following components:   pCO2, Ven 34.8 (*)    Calcium , Ion 1.11 (*)    HCT 22.0 (*)    Hemoglobin 7.5 (*)    All other components within normal limits  POC OCCULT BLOOD, ED - Abnormal; Notable for the following components:   Fecal Occult Bld POSITIVE (*)    All other components within  normal limits  RESP PANEL BY RT-PCR (RSV, FLU A&B, COVID)  RVPGX2  URINE CULTURE  LIPASE, BLOOD  ETHANOL  AMMONIA  VITAMIN B12  FOLATE  FERRITIN  BRAIN NATRIURETIC PEPTIDE  OCCULT BLOOD X 1 CARD TO LAB, STOOL  CBG MONITORING, ED  CBG MONITORING, ED  TYPE AND SCREEN  PREPARE RBC (CROSSMATCH)  TROPONIN I (HIGH SENSITIVITY)  TROPONIN I (HIGH SENSITIVITY)                                                                                                                          Radiology CT Angio Chest PE W and/or Wo Contrast Result Date: 09/19/2023 EXAM: CTA CHEST PE WITH/WITHOUT AND WITH CONTRAST CT ABDOMEN AND PELVIS WITH/WITHOUT AND WITH CONTRAST 09/19/2023 07:32:11 PM TECHNIQUE: CTA of the chest was performed after the administration of intravenous contrast (iohexol  (OMNIPAQUE ) 350 MG/ML injection 75 mL). Multiplanar reformatted images are provided for review.  MIP images are provided for review. CT of the abdomen and pelvis was performed with the administration of intravenous contrast. Automated exposure control, iterative reconstruction, and/or weight based adjustment of the mA/kV was utilized to reduce the radiation dose to as low as reasonably achievable. COMPARISON: Comparison with same day chest radiograph; CT chest pelvis 13/20/25. CLINICAL HISTORY: Pulmonary embolism (PE) suspected, high prob. Pt AMS, unable to follow breathing instructions at this time. Abdominal pain. Shortness of breath. FINDINGS: CHEST: PULMONARY ARTERIES: Pulmonary arteries are adequately opacified for evaluation. No intraluminal filling defect to suggest pulmonary embolism. MEDIASTINUM: New mediastinal and hilar lymphadenopathy. For example, a AP window node on series 6 image 122 measuring 1.3 cm and a 1.3 cm right hilar node on series 6 image 159 measuring 1.3 cm. The heart and pericardium demonstrate no acute abnormality. There is no acute abnormality of the thoracic aorta. Coronary artery atherosclerotic calcifications are present. LUNGS AND PLEURA: Diffuse bilateral ground-glass opacities with interlobular septal thickening and small bilateral pleural effusions most suggestive of congestive heart failure. Atelectasis in the lower lobes. No pneumothorax. SOFT TISSUES AND BONES: No acute bone or soft tissue abnormality. ABDOMEN AND PELVIS: LIVER: Hepatic steatosis. GALLBLADDER AND BILE DUCTS: Cholelithiasis. No evidence of acute cholecystitis. No biliary ductal dilatation. SPLEEN: Spleen demonstrates no acute abnormality. PANCREAS: Pancreas demonstrates no acute abnormality. 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 No follow-up recommended. Adrenal glands are otherwise unremarkable. KIDNEYS, URETERS AND BLADDER: No stones in the kidneys or ureters. No hydronephrosis. No perinephric or periureteral stranding. Urinary bladder is unremarkable. GI AND BOWEL: Stomach and  duodenal sweep demonstrate no acute abnormality. There is no bowel obstruction. No abnormal bowel wall thickening or distension. REPRODUCTIVE: Reproductive organs are unremarkable. PERITONEUM AND RETROPERITONEUM: No ascites or free air. LYMPH NODES: No lymphadenopathy in the abdomen or pelvis . BONES AND SOFT TISSUES: No acute abnormality of the visualized bones. No focal soft tissue abnormality. IMPRESSION: 1. No pulmonary embolism. 2. Diffuse bilateral ground-glass opacities with interlobular septal thickening and small bilateral pleural effusions, most suggestive  of congestive heart failure. Atypical pneumonia could appear similarly. 3. New mediastinal and hilar lymphadenopathy, likely reactive. Continue observation on follow-up . 4. Hepatic steatosis. 5. Cholelithiasis without evidence of acute cholecystitis. Electronically signed by: Norman Gatlin MD 09/19/2023 07:57 PM EDT RP Workstation: [id number]   CT ABDOMEN PELVIS W CONTRAST Result Date: 09/19/2023 EXAM: CTA CHEST PE WITH/WITHOUT AND WITH CONTRAST CT ABDOMEN AND PELVIS WITH/WITHOUT AND WITH CONTRAST 09/19/2023 07:32:11 PM TECHNIQUE: CTA of the chest was performed after the administration of intravenous contrast (iohexol  (OMNIPAQUE ) 350 MG/ML injection 75 mL). Multiplanar reformatted images are provided for review. MIP images are provided for review. CT of the abdomen and pelvis was performed with the administration of intravenous contrast. Automated exposure control, iterative reconstruction, and/or weight based adjustment of the mA/kV was utilized to reduce the radiation dose to as low as reasonably achievable. COMPARISON: Comparison with same day chest radiograph; CT chest pelvis 13/20/25. CLINICAL HISTORY: Pulmonary embolism (PE) suspected, high prob. Pt AMS, unable to follow breathing instructions at this time. Abdominal pain. Shortness of breath. FINDINGS: CHEST: PULMONARY ARTERIES: Pulmonary arteries are adequately opacified for evaluation. No  intraluminal filling defect to suggest pulmonary embolism. MEDIASTINUM: New mediastinal and hilar lymphadenopathy. For example, a AP window node on series 6 image 122 measuring 1.3 cm and a 1.3 cm right hilar node on series 6 image 159 measuring 1.3 cm. The heart and pericardium demonstrate no acute abnormality. There is no acute abnormality of the thoracic aorta. Coronary artery atherosclerotic calcifications are present. LUNGS AND PLEURA: Diffuse bilateral ground-glass opacities with interlobular septal thickening and small bilateral pleural effusions most suggestive of congestive heart failure. Atelectasis in the lower lobes. No pneumothorax. SOFT TISSUES AND BONES: No acute bone or soft tissue abnormality. ABDOMEN AND PELVIS: LIVER: Hepatic steatosis. GALLBLADDER AND BILE DUCTS: Cholelithiasis. No evidence of acute cholecystitis. No biliary ductal dilatation. SPLEEN: Spleen demonstrates no acute abnormality. PANCREAS: Pancreas demonstrates no acute abnormality. 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 No follow-up recommended. Adrenal glands are otherwise unremarkable. KIDNEYS, URETERS AND BLADDER: No stones in the kidneys or ureters. No hydronephrosis. No perinephric or periureteral stranding. Urinary bladder is unremarkable. GI AND BOWEL: Stomach and duodenal sweep demonstrate no acute abnormality. There is no bowel obstruction. No abnormal bowel wall thickening or distension. REPRODUCTIVE: Reproductive organs are unremarkable. PERITONEUM AND RETROPERITONEUM: No ascites or free air. LYMPH NODES: No lymphadenopathy in the abdomen or pelvis . BONES AND SOFT TISSUES: No acute abnormality of the visualized bones. No focal soft tissue abnormality. IMPRESSION: 1. No pulmonary embolism. 2. Diffuse bilateral ground-glass opacities with interlobular septal thickening and small bilateral pleural effusions, most suggestive of congestive heart failure. Atypical pneumonia could appear similarly. 3.  New mediastinal and hilar lymphadenopathy, likely reactive. Continue observation on follow-up . 4. Hepatic steatosis. 5. Cholelithiasis without evidence of acute cholecystitis. Electronically signed by: Norman Gatlin MD 09/19/2023 07:57 PM EDT RP Workstation: [id number]   CT Head Wo Contrast Result Date: 09/19/2023 CLINICAL DATA:  Delirium EXAM: CT HEAD WITHOUT CONTRAST TECHNIQUE: Contiguous axial images were obtained from the base of the skull through the vertex without intravenous contrast. RADIATION DOSE REDUCTION: This exam was performed according to the departmental dose-optimization program which includes automated exposure control, adjustment of the mA and/or kV according to patient size and/or use of iterative reconstruction technique. COMPARISON:  MRI head Jun 04, 2022.  CT head 08/01/2023. FINDINGS: Brain: Similar remote infarct in the right parietal lobe with a severe patchy and confluent white matter hypoattenuation, compatible with  chronic microvascular ischemic change. Additional remote infarct in the left cerebellum. Chronic basal ganglia calcifications. No evidence of acute large vascular territory infarct, acute hemorrhage, mass lesion or midline shift. Vascular: Calcific atherosclerosis.  No hyperdense vessel. Skull: No acute fracture. Sinuses/Orbits: Clear sinuses.  No acute orbital findings. Other: No mastoid effusions. IMPRESSION: 1. No evidence of acute intracranial abnormality. 2. 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 Electronically Signed   By: Gilmore GORMAN Molt M.D.   On: 09/19/2023 19:44   DG Chest Portable 1 View Result Date: 09/19/2023 CLINICAL DATA:  Short of breath EXAM: PORTABLE CHEST 1 VIEW COMPARISON:  08/28/2023 FINDINGS: Single frontal view of the chest demonstrates a stable cardiac silhouette. There is increased pulmonary vascular congestion, with basilar predominant interstitial and airspace opacities compatible with edema. No large effusion or  pneumothorax. No acute bony abnormalities. IMPRESSION: 1. Findings most consistent with congestive heart failure and mild pulmonary edema. Electronically Signed   By: Ozell Daring M.D.   On: 09/19/2023 19:40    Pertinent labs & imaging results that were available during my care of the patient were reviewed by me and considered in my medical decision making (see MDM for details).  Medications Ordered in ED Medications  azithromycin  (ZITHROMAX ) 500 mg in sodium chloride  0.9 % 250 mL IVPB (500 mg Intravenous New Bag/Given 09/19/23 2203)  ipratropium-albuterol  (DUONEB) 0.5-2.5 (3) MG/3ML nebulizer solution 3 mL (3 mLs Nebulization Given 09/19/23 2015)  iohexol  (OMNIPAQUE ) 350 MG/ML injection 75 mL (75 mLs Intravenous Contrast Given 09/19/23 1932)  cefTRIAXone  (ROCEPHIN ) 1 g in sodium chloride  0.9 % 100 mL IVPB (0 g Intravenous Stopped 09/19/23 2159)  furosemide  (LASIX ) injection 40 mg (40 mg Intravenous Given 09/19/23 2113)  0.9 %  sodium chloride  infusion (Manually program via Guardrails IV Fluids) ( Intravenous New Bag/Given 09/19/23 2000)  pantoprazole  (PROTONIX ) injection 40 mg (40 mg Intravenous Given 09/19/23 2122)                                                                                                                                     Procedures .Critical Care  Performed by: Elnor Jayson LABOR, DO Authorized by: Elnor Jayson LABOR, DO   Critical care provider statement:    Critical care time (minutes):  60   Critical care time was exclusive of:  Separately billable procedures and treating other patients   Critical care was necessary to treat or prevent imminent or life-threatening deterioration of the following conditions:  Circulatory failure and respiratory failure   Critical care was time spent personally by me on the following activities:  Development of treatment plan with patient or surrogate, discussions with consultants, evaluation of patient's response to treatment, examination of patient,  ordering and review of laboratory studies, ordering and review of radiographic studies, ordering and performing treatments and interventions, pulse oximetry, re-evaluation of patient's condition, review of old charts and obtaining history from patient or surrogate  Care discussed with: admitting provider     (including critical care time)  Medical Decision Making / ED Course    Medical Decision Making:    Raima Geathers is a 73 y.o. female with past medical history as below, significant for CVA, aphasia, type II DM, HLD, HTN, hypertension who presents to the ED with complaint of hypoxia, AMS. The complaint involves an extensive differential diagnosis and also carries with it a high risk of complications and morbidity.  Serious etiology was considered. Ddx includes but is not limited to: Differential diagnoses for altered mental status includes but is not exclusive to alcohol, illicit or prescription medications, intracranial pathology such as stroke, intracerebral hemorrhage, fever or infectious causes including sepsis, hypoxemia, uremia, trauma, endocrine related disorders such as diabetes, hypoglycemia, thyroid-related diseases, etc.   Complete initial physical exam performed, notably the patient was in NAD, tachypnea noted, wheezing.    Reviewed and confirmed nursing documentation for past medical history, family history, social history.  Vital signs reviewed.    She was admitted last month at [facility], hypoxic respiratory failure multi focal pneumonia, cystitis, anemia, delirium.  At that time she initially did present with dyspnea, weakness.  She was hypoxic at multifocal pneumonia.  She started broad-spectrum antibiotics, she had pulmonary edema and was given diuresis.  She had effusion status post thoracentesis culture without growth.  She was switched to cefepime and did improve.  Back to room air at time of discharge.  History of prior GI bleed with gastric ulcer, patient noncompliant with  PPI, should hemoglobin 7 during recent admission received PRBC.  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  Advised to follow-up with GI in the office Dr. Rollin.  Patient also became delirious while in the hospital which did not prove, she refused SNF placement.  Patient reportedly having odd remarks throughout her hospital stay and intermittent mental status changes.  She was seen by psychiatry and deemed to have capacity for decision-making.  She was discharged with home health although SNF was strongly recommended patient refused.    AMS> - Report of confusion, possible syncope, generalized fatigue, malaise - History of prior CVA, her neuroexam is intact today, she has chronic speech abnormality which patient feels is unchanged - Last known normal was greater than 24 hours ago - AMS appears likely multifactorial etiology, she has acute anemia, acute CHF, UTI, possible concurrent pneumonia; she has a nonfocal neuro exam  Acute cystitis> - does not appear to have pyelonephritis - sent urine culture, abx for pna will cover uti as well  Dyspnea > - History of COPD, does not use home oxygen, she uses albuterol  inhaler at home as needed per patient, she was hypoxic on EMS arrival with diffuse wheezing, given nebulized breathing treatment improvement.  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  She has history of neuroendocrine ulcer, follows with GI Dr. Rollin - heme+tive stool, no frank blood or melena noted in rectal vault - give protonix  - PRBC x1 - Dr Kristie notified of admission          Will plan for admission at this  time, pt is agreeable               Additional history obtained: -Additional history obtained from ems -External records from outside source obtained and reviewed including: Chart review including previous notes, labs, imaging, consultation notes including  Prior admission Prior labs Prior imaging    Lab Tests: -I ordered, reviewed, and interpreted labs.   The pertinent results include:   Labs Reviewed  COMPREHENSIVE METABOLIC PANEL WITH GFR - Abnormal; Notable for the following components:      Result Value   Glucose, Bld 114 (*)    Calcium  8.7 (*)    Albumin 2.8 (*)    Alkaline Phosphatase 138 (*)    All other components within normal limits  URINALYSIS, ROUTINE W REFLEX MICROSCOPIC - Abnormal; Notable for the following components:   APPearance HAZY (*)    Nitrite POSITIVE (*)    Leukocytes,Ua SMALL (*)    Bacteria, UA RARE (*)    All other components within normal limits  CK - Abnormal; Notable for the following components:   Total CK 31 (*)    All other components within normal limits  CBC WITH DIFFERENTIAL/PLATELET - Abnormal; Notable for the following components:   RBC 3.40 (*)    Hemoglobin 6.6 (*)    HCT 24.9 (*)    MCV 73.2 (*)    MCH 19.4 (*)    MCHC 26.5 (*)    RDW 22.0 (*)    Platelets 460 (*)    All other components within normal limits  RAPID URINE DRUG SCREEN, HOSP PERFORMED - Abnormal; Notable for the following components:   Benzodiazepines POSITIVE (*)    All other components within normal limits  IRON  AND TIBC - Abnormal; Notable for the following components:   Iron  <10 (*)    All other components within normal limits  RETICULOCYTES - Abnormal; Notable for the following components:   RBC. 3.18 (*)    Immature Retic Fract 35.1 (*)    All other components within normal limits  I-STAT CHEM 8, ED - Abnormal; Notable for the following components:   Glucose, Bld 101 (*)    Calcium , Ion 1.10 (*)     Hemoglobin 8.5 (*)    HCT 25.0 (*)    All other components within normal limits  I-STAT VENOUS BLOOD GAS, ED - Abnormal; Notable for the following components:   pCO2, Ven 34.8 (*)    Calcium , Ion 1.11 (*)    HCT 22.0 (*)    Hemoglobin 7.5 (*)    All other components within normal limits  POC OCCULT BLOOD, ED - Abnormal; Notable for the following components:   Fecal Occult Bld POSITIVE (*)    All other components within normal limits  RESP PANEL BY RT-PCR (RSV, FLU A&B, COVID)  RVPGX2  URINE CULTURE  LIPASE, BLOOD  ETHANOL  AMMONIA  VITAMIN B12  FOLATE  FERRITIN  BRAIN NATRIURETIC PEPTIDE  OCCULT BLOOD X 1 CARD TO LAB, STOOL  CBG MONITORING, ED  CBG MONITORING, ED  TYPE AND SCREEN  PREPARE RBC (CROSSMATCH)  TROPONIN I (HIGH SENSITIVITY)  TROPONIN I (HIGH SENSITIVITY)    Notable for as above  EKG   EKG Interpretation Date/Time:    Ventricular Rate:    PR Interval:    QRS Duration:    QT Interval:    QTC Calculation:   R Axis:      Text Interpretation:           Imaging Studies ordered:  I ordered imaging studies including CT PE, CTAP, CTH, CXR I independently visualized the following imaging with scope of interpretation limited to determining acute life threatening conditions related to emergency care; findings noted above I agree with the radiologist interpretation If any imaging was obtained with contrast I closely monitored patient for any possible adverse reaction a/w contrast administration in the emergency department   Medicines ordered and prescription drug management: Meds ordered this encounter  Medications   ipratropium-albuterol  (DUONEB) 0.5-2.5 (3) MG/3ML nebulizer solution 3 mL   iohexol  (OMNIPAQUE ) 350 MG/ML injection 75 mL   cefTRIAXone  (ROCEPHIN ) 1 g in sodium chloride  0.9 % 100 mL IVPB    Antibiotic Indication::   CAP   azithromycin  (ZITHROMAX ) 500 mg in sodium chloride  0.9 % 250 mL IVPB    Antibiotic Indication::   CAP   furosemide   (LASIX ) injection 40 mg   0.9 %  sodium chloride  infusion (Manually program via Guardrails IV Fluids)   pantoprazole  (PROTONIX ) injection 40 mg    -I have reviewed the patients home medicines and have made adjustments as needed   Consultations Obtained: I requested consultation with the gi, hospitalist,  and discussed lab and imaging findings as well as pertinent plan    Cardiac Monitoring: The patient was maintained on a cardiac monitor.  I personally viewed and interpreted the cardiac monitored which showed an underlying rhythm of: nsr Continuous pulse oximetry interpreted by myself, 96% on 3LNC.    Social Determinants of Health:  Diagnosis or treatment significantly limited by social determinants of health: current smoker Counseled patient for approximately 3 minutes regarding smoking cessation. Discussed risks of smoking and how they applied and affected their visit here today. Patient not ready to quit at this time  CPT code: 00593: intermediate counseling for smoking cessation     Reevaluation: After the interventions noted above, I reevaluated the patient and found that they have improved  Co morbidities that complicate the patient evaluation  Past Medical History:  Diagnosis Date   Acute pyelonephritis 02/02/2016   Diabetes mellitus 01/15/2004   T2DM   Hyperlipidemia    Hypertension    Obesity (BMI 30-39.9)    Pneumonia 09/06/2020   Sepsis (HCC) 02/02/2016   Stroke [facility])       Dispostion: Disposition decision including need for hospitalization was considered, and patient admitted to the hospital.    Final Clinical Impression(s) / ED Diagnoses Final diagnoses:  Altered mental status, unspecified altered mental status type  Acute congestive heart failure, unspecified heart failure type (HCC)  Community acquired pneumonia, unspecified laterality  Anemia, unspecified type  Gastrointestinal hemorrhage, unspecified gastrointestinal hemorrhage type  Acute  cystitis without hematuria        Elnor Jayson LABOR, DO 09/19/23 2216    Elnor Jayson LABOR, DO 09/19/23 2217    Elnor Jayson LABOR, DO 09/22/23 0745

## 2023-09-19 NOTE — H&P (Signed)
 History and Physical    Patient: Allison Hughes FMW:[id number] DOB: 1950/02/26 DOA: 09/19/2023 DOS: the patient was seen and examined on 09/19/2023 PCP: Valma Carwin, MD  Patient coming from: Home  Chief Complaint: Very confused and multiple falls at home Chief Complaint  Patient presents with   Altered Mental Status   Shortness of Breath   HPI: Allison Hughes is a 73 y.o. female with medical history significant of hypertension, COPD, hyperlipidemia, type 2 diabetes, previous stroke, who lives alone at home brought in by a friend who checks up on her regularly after patient was found laying on the floor.  History obtained from chart review as well as phone conversation with patient's friend.  According to the friend patient only had 1 biological daughter who died some years ago and does not have any family except a brother who lives in [place] but patient does not keep in touch with the brother.  According to the friend he is the only one who checks up on her and they have been friends for a long time.  Patient unable to contribute to history as she is confused.  She has fallen about 5 times within this week.  EMS was called by patient's friend, upon arrival of EMS patient's saturation was in the 80s she received some nebulization with improvement in respiratory function and subsequently brought into the emergency room for further management.  Chart review shows patient was admitted at [facility] on 08/16/2023 with upper GI bleed as well as acute hypoxic respiratory failure found to be due to multifocal pneumonia.  ED course: Temperature 97.9, respiratory rate 20, pulse 85, blood pressure 121/62 saturating 92 to 100% on 3 L of oxygen. Chest x-ray showing findings of pulmonary vascular congestion CT scan of the brain did not show acute intracranial pathology CT angio of the chest did not show PE however showed bilateral ground glass opacity concerning for atypical pneumonia as well as CHF CT scan of  the abdomen showed cholelithiasis without evidence of acute cholecystitis [facility] service was therefore contacted to admit patient for further management  Review of Systems: unable to review all systems due to the inability of the patient to answer questions. Past Medical History:  Diagnosis Date   Acute pyelonephritis 02/02/2016   Diabetes mellitus 01/15/2004   T2DM   Hyperlipidemia    Hypertension    Obesity (BMI 30-39.9)    Pneumonia 09/06/2020   Sepsis (HCC) 02/02/2016   Stroke [facility])    Past Surgical History:  Procedure Laterality Date   CESAREAN SECTION     LOOP RECORDER INSERTION N/A 09/08/2020   Procedure: LOOP RECORDER INSERTION;  Surgeon: Inocencio Soyla Lunger, MD;  Location: [facility] INVASIVE CV LAB;  Service: Cardiovascular;  Laterality: N/A;   ORIF ANKLE FRACTURE  05/11/2011   Procedure: OPEN REDUCTION INTERNAL FIXATION (ORIF) ANKLE FRACTURE;  Surgeon: Jerona LULLA Sage, MD;  Location: [facility] OR;  Service: Orthopedics;  Laterality: Right;   TUBAL LIGATION     Social History:  reports that she has been smoking cigarettes. She has a 40 pack-year smoking history. She quit smokeless tobacco use about 12 years ago. She reports that she does not drink alcohol and does not use drugs.  Allergies  Allergen Reactions   Iodine Swelling   Shrimp [Shellfish Allergy] Swelling    Fresh shrimp    Family History  Problem Relation Age of Onset   Drug abuse Daughter    Diabetes Daughter     Prior to Admission medications   Medication  Sig Start Date End Date Taking? Authorizing Provider  albuterol  (VENTOLIN  HFA) 108 (90 Base) MCG/ACT inhaler Inhale 2 puffs into the lungs every 6 (six) hours as needed for wheezing or shortness of breath. 06/06/22   Ghimire, Donalda HERO, MD  ALPRAZolam  (XANAX ) 1 MG tablet Take 1 mg by mouth 2 (two) times daily as needed for anxiety or sleep. For anxiety and sleep.    [provider]  aspirin  EC 81 MG tablet Take 81 mg by mouth daily.    [provider]   atorvastatin  (LIPITOR) 40 MG tablet Take 40 mg by mouth daily. 08/06/18   [provider]  clopidogrel  (PLAVIX ) 75 MG tablet Take 1 tablet (75 mg total) by mouth daily. 06/06/22   Ghimire, Donalda HERO, MD  Cyanocobalamin  (VITAMIN B-12) 3000 MCG/ML LIQD Place 1,500 mcg under the tongue daily.    [provider]  ferrous sulfate  325 (65 FE) MG tablet Take 1 tablet (325 mg total) by mouth 2 (two) times daily with a meal for 30 days then as directed by MD 09/03/22   Dennise Lavada POUR, MD  folic acid  (FOLVITE ) 1 MG tablet Take 1 tablet (1 mg total) by mouth daily. 09/03/22   Dennise Lavada POUR, MD  lisinopril  (PRINIVIL ,ZESTRIL ) 20 MG tablet Take 20 mg by mouth at bedtime.    [provider]  metFORMIN  (GLUCOPHAGE -XR) 500 MG 24 hr tablet Take 500 mg by mouth at bedtime. 10/03/20   [provider]  Omega-3 Fatty Acids (FISH OIL PO) Take 1 capsule by mouth at bedtime.    [provider]  OZEMPIC, 1 MG/DOSE, 4 MG/3ML SOPN Inject 1 mg into the skin every Saturday. 05/21/22   [provider]  pantoprazole  (PROTONIX ) 40 MG tablet Take 1 tablet (40 mg total) by mouth daily. 09/03/22   Singh, Prashant K, MD  TRESIBA  FLEXTOUCH 100 UNIT/ML FlexTouch Pen Inject 12 Units into the skin daily. Patient taking differently: Inject 8-10 Units into the skin daily as needed (high blood sugar). 06/06/22   Raenelle Donalda HERO, MD    Physical Exam: Vitals:   09/19/23 1758 09/19/23 1815 09/19/23 2107 09/19/23 2126  BP: 121/62  118/77 (!) 126/59  Pulse: 85 88 89 89  Resp: 20 (!) 25 17 20   Temp: 97.9 F (36.6 C)  98 F (36.7 C) 98.1 F (36.7 C)  TempSrc:   Oral Oral  SpO2: 100% 92% 100%    General: Elderly female laying in bed confused CNS: Oriented in person but disoriented to time and place moving all extremities. CVS: S1-S2 present, Respiratory: Decreased air entry bibasilarly Abdomen: Obese nontender Musculoskeletal: No abnormality detected of motion  Psych:  Confused  Data Reviewed:     Latest Ref Rng & Units 09/19/2023    7:14 PM 09/19/2023    6:45 PM 09/19/2023    6:36 PM  CBC  WBC 4.0 - 10.5 K/uL   8.4   Hemoglobin 12.0 - 15.0 g/dL 7.5  8.5  6.6   Hematocrit 36.0 - 46.0 % 22.0  25.0  24.9   Platelets 150 - 400 K/uL   460        Latest Ref Rng & Units 09/19/2023    7:14 PM 09/19/2023    6:45 PM 09/19/2023    6:28 PM  BMP  Glucose 70 - 99 mg/dL  898  885   BUN 8 - 23 mg/dL  10  10   Creatinine 9.55 - 1.00 mg/dL  9.19  9.12   Sodium  135 - 145 mmol/L 142  141  141   Potassium 3.5 - 5.1 mmol/L 3.7  4.1  3.7   Chloride 98 - 111 mmol/L  109  107   CO2 22 - 32 mmol/L   23   Calcium  8.9 - 10.3 mg/dL   8.7      Assessment and Plan:  Acute hypoxic respiratory failure secondary to multifocal pneumonia COPD-not in acute exacerbation CT angio of the chest did not show PE however showed bilateral ground glass opacity concerning for atypical pneumonia as well as CHF BNP only 78.7 Continue as needed nebulization We will cover with ceftriaxone  and azithromycin  Follow-up on respiratory panel Currently on 3 L of oxygen we will maintain saturation between 89 and 92%  Acute blood loss 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 GI consulted by emergency room physician (ED physician spoke with Dr. Kristie)  Continue Protonix  Follow-up on viral respiratory panel  Acute metabolic encephalopathy likely secondary to above CT scan of the brain did not show acute intracranial pathology Monitor neurochecks  Hypertension Resume home medication when no longer n.p.o.  Hyperlipidemia Resume home medication when no longer n.p.o.  Chronic diastolic  congestive heart failure Echo May 2024 showed EF 65 to 70% with grade 1 diastolic dysfunction Presented with BNP of 78 Appears euvolemic Monitor daily weight as well as input and output monitoring  Cholelithiasis with no evidence of acute cholecystitis CT scan of the abdomen showed cholelithiasis without evidence of acute cholecystitis Patient will benefit from outpatient follow-up  Type 2 diabetes  Monitor glucose closely We will cover with sliding scale  DVT prophylaxis-SCD   Advance Care Planning:   Code Status: Prior full code  Consults: Gastroenterologist  Family Communication: Discussed with patient's friend over the phone  Severity of Illness: The appropriate patient status for this patient is INPATIENT. Inpatient status is judged to be reasonable and necessary in order to provide the required intensity of service to ensure the patient's safety. The patient's presenting symptoms, physical exam findings, and initial radiographic and laboratory data in the context of their chronic comorbidities is felt to place them at high risk for further clinical deterioration. Furthermore, it is not anticipated that the patient will be medically stable for discharge from the hospital within 2 midnights of admission.   * I certify that at the point of admission it is my clinical judgment that the patient will require inpatient hospital care spanning beyond 2 midnights from the point of admission due to high intensity of service, high risk for further deterioration and high frequency of surveillance required.*  Author: Drue ONEIDA Potter, MD 09/19/2023 10:27 PM  For on call review [email or web address].

## 2023-09-19 NOTE — H&P (Incomplete)
 History and Physical    Patient: Allison Hughes FMW:[id number] DOB: 15-May-1950 DOA: 09/19/2023 DOS: the patient was seen and examined on 09/19/2023 PCP: Valma Carwin, MD  Patient coming from: Home  Chief Complaint: Very confused and fell Chief Complaint  Patient presents with  . Altered Mental Status  . Shortness of Breath   HPI: Allison Hughes is a 73 y.o. female with medical history significant of hypertension, hyperlipidemia, type 2 diabetes, stroke, Review of Systems: unable to review all systems due to the inability of the patient to answer questions. Past Medical History:  Diagnosis Date  . Acute pyelonephritis 02/02/2016  . Diabetes mellitus 01/15/2004   T2DM  . Hyperlipidemia   . Hypertension   . Obesity (BMI 30-39.9)   . Pneumonia 09/06/2020  . Sepsis (HCC) 02/02/2016  . Stroke [facility])    Past Surgical History:  Procedure Laterality Date  . CESAREAN SECTION    . LOOP RECORDER INSERTION N/A 09/08/2020   Procedure: LOOP RECORDER INSERTION;  Surgeon: Inocencio Soyla Lunger, MD;  Location: [facility] INVASIVE CV LAB;  Service: Cardiovascular;  Laterality: N/A;  . ORIF ANKLE FRACTURE  05/11/2011   Procedure: OPEN REDUCTION INTERNAL FIXATION (ORIF) ANKLE FRACTURE;  Surgeon: Jerona LULLA Sage, MD;  Location: [facility] OR;  Service: Orthopedics;  Laterality: Right;  . TUBAL LIGATION     Social History:  reports that she has been smoking cigarettes. She has a 40 pack-year smoking history. She quit smokeless tobacco use about 12 years ago. She reports that she does not drink alcohol and does not use drugs.  Allergies  Allergen Reactions  . Iodine Swelling  . Shrimp [Shellfish Allergy] Swelling    Fresh shrimp    Family History  Problem Relation Age of Onset  . Drug abuse Daughter   . Diabetes Daughter     Prior to Admission medications   Medication Sig Start Date End Date Taking? Authorizing Provider  albuterol  (VENTOLIN  HFA) 108 (90 Base) MCG/ACT inhaler Inhale 2 puffs into the lungs every  6 (six) hours as needed for wheezing or shortness of breath. 06/06/22   Ghimire, Donalda HERO, MD  ALPRAZolam  (XANAX ) 1 MG tablet Take 1 mg by mouth 2 (two) times daily as needed for anxiety or sleep. For anxiety and sleep.    [provider]  aspirin  EC 81 MG tablet Take 81 mg by mouth daily.    [provider]  atorvastatin  (LIPITOR) 40 MG tablet Take 40 mg by mouth daily. 08/06/18   [provider]  clopidogrel  (PLAVIX ) 75 MG tablet Take 1 tablet (75 mg total) by mouth daily. 06/06/22   Ghimire, Donalda HERO, MD  Cyanocobalamin  (VITAMIN B-12) 3000 MCG/ML LIQD Place 1,500 mcg under the tongue daily.    [provider]  ferrous sulfate  325 (65 FE) MG tablet Take 1 tablet (325 mg total) by mouth 2 (two) times daily with a meal for 30 days then as directed by MD 09/03/22   Dennise Lavada POUR, MD  folic acid  (FOLVITE ) 1 MG tablet Take 1 tablet (1 mg total) by mouth daily. 09/03/22   Dennise Lavada POUR, MD  lisinopril  (PRINIVIL ,ZESTRIL ) 20 MG tablet Take 20 mg by mouth at bedtime.    [provider]  metFORMIN  (GLUCOPHAGE -XR) 500 MG 24 hr tablet Take 500 mg by mouth at bedtime. 10/03/20   [provider]  Omega-3 Fatty Acids (FISH OIL PO) Take 1 capsule by mouth at bedtime.    [provider]  OZEMPIC, 1 MG/DOSE, 4 MG/3ML SOPN Inject  1 mg into the skin every Saturday. 05/21/22   [provider]  pantoprazole  (PROTONIX ) 40 MG tablet Take 1 tablet (40 mg total) by mouth daily. 09/03/22   Singh, Prashant K, MD  TRESIBA  FLEXTOUCH 100 UNIT/ML FlexTouch Pen Inject 12 Units into the skin daily. Patient taking differently: Inject 8-10 Units into the skin daily as needed (high blood sugar). 06/06/22   Raenelle Donalda HERO, MD    Physical Exam: Vitals:   09/19/23 1758 09/19/23 1815 09/19/23 2107 09/19/23 2126  BP: 121/62  118/77 (!) 126/59  Pulse: 85 88 89 89  Resp: 20 (!) 25 17 20   Temp: 97.9 F (36.6 C)  98 F (36.7 C) 98.1 F (36.7 C)  TempSrc:    Oral Oral  SpO2: 100% 92% 100%    General: Elderly female laying in bed confused CNS: Oriented in person but disoriented to time and place moving all extremities. CVS: S1-S2 present, Respiratory: Decreased air entry bibasilarly Abdomen: Obese nontender Musculoskeletal: No abnormality detected of motion  Psych: Confused  Data Reviewed:     Latest Ref Rng & Units 09/19/2023    7:14 PM 09/19/2023    6:45 PM 09/19/2023    6:36 PM  CBC  WBC 4.0 - 10.5 K/uL   8.4   Hemoglobin 12.0 - 15.0 g/dL 7.5  8.5  6.6   Hematocrit 36.0 - 46.0 % 22.0  25.0  24.9   Platelets 150 - 400 K/uL   460        Latest Ref Rng & Units 09/19/2023    7:14 PM 09/19/2023    6:45 PM 09/19/2023    6:28 PM  BMP  Glucose 70 - 99 mg/dL  898  885   BUN 8 - 23 mg/dL  10  10   Creatinine 9.55 - 1.00 mg/dL  9.19  9.12   Sodium 864 - 145 mmol/L 142  141  141   Potassium 3.5 - 5.1 mmol/L 3.7  4.1  3.7   Chloride 98 - 111 mmol/L  109  107   CO2 22 - 32 mmol/L   23   Calcium  8.9 - 10.3 mg/dL   8.7      Assessment and Plan:   Acute hypoxic respiratory failure secondary to multifocal pneumonia   Acute blood loss 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 GI consult  Type 2 diabetes     Advance Care Planning:   Code Status: Prior ***  Consults: ***  Family Communication: ***  Severity of Illness: The appropriate patient status for this patient is INPATIENT. Inpatient status is judged to be reasonable and necessary in order to provide the required intensity of service to ensure the patient's safety. The patient's presenting symptoms, physical exam findings, and initial radiographic and laboratory data in the  context of their chronic comorbidities is felt to place them at high risk for further clinical deterioration. Furthermore, it is not anticipated that the patient will be medically stable for discharge from the hospital within 2 midnights of admission.   * I certify that at the point of admission it is my clinical judgment that the patient will require inpatient hospital care spanning beyond 2 midnights from the point of  admission due to high intensity of service, high risk for further deterioration and high frequency of surveillance required.*  Author: Drue ONEIDA Potter, MD 09/19/2023 10:27 PM  For on call review [email or web address]

## 2023-09-20 DIAGNOSIS — C7A8 Other malignant neuroendocrine tumors: Secondary | ICD-10-CM | POA: Diagnosis not present

## 2023-09-20 DIAGNOSIS — J9601 Acute respiratory failure with hypoxia: Secondary | ICD-10-CM | POA: Insufficient documentation

## 2023-09-20 DIAGNOSIS — D62 Acute posthemorrhagic anemia: Secondary | ICD-10-CM | POA: Diagnosis not present

## 2023-09-20 HISTORY — DX: Acute respiratory failure with hypoxia: J96.01

## 2023-09-20 LAB — CBC
HCT: 26.7 % — ABNORMAL LOW (ref 36.0–46.0)
Hemoglobin: 7.5 g/dL — ABNORMAL LOW (ref 12.0–15.0)
MCH: 20.4 pg — ABNORMAL LOW (ref 26.0–34.0)
MCHC: 28.1 g/dL — ABNORMAL LOW (ref 30.0–36.0)
MCV: 72.8 fL — ABNORMAL LOW (ref 80.0–100.0)
Platelets: 328 K/uL (ref 150–400)
RBC: 3.67 MIL/uL — ABNORMAL LOW (ref 3.87–5.11)
RDW: 21.5 % — ABNORMAL HIGH (ref 11.5–15.5)
WBC: 7.3 K/uL (ref 4.0–10.5)
nRBC: 0 % (ref 0.0–0.2)

## 2023-09-20 LAB — BASIC METABOLIC PANEL WITH GFR
Anion gap: 15 (ref 5–15)
BUN: 10 mg/dL (ref 8–23)
CO2: 22 mmol/L (ref 22–32)
Calcium: 8.5 mg/dL — ABNORMAL LOW (ref 8.9–10.3)
Chloride: 106 mmol/L (ref 98–111)
Creatinine, Ser: 0.8 mg/dL (ref 0.44–1.00)
GFR, Estimated: >60 mL/min (ref >60)
Glucose, Bld: 137 mg/dL — ABNORMAL HIGH (ref 70–99)
Potassium: 3.8 mmol/L (ref 3.5–5.1)
Sodium: 143 mmol/L (ref 135–145)

## 2023-09-20 LAB — RESPIRATORY PANEL BY PCR

## 2023-09-20 LAB — BPAM RBC
Blood Product Expiration Date: 202509272359
ISSUE DATE / TIME: 202509052039
Unit Type and Rh: 5100

## 2023-09-20 LAB — GLUCOSE, CAPILLARY
Glucose-Capillary: 151 mg/dL — ABNORMAL HIGH (ref 70–99)
Glucose-Capillary: 110 mg/dL — ABNORMAL HIGH (ref 70–99)
Glucose-Capillary: 115 mg/dL — ABNORMAL HIGH (ref 70–99)
Glucose-Capillary: 92 mg/dL (ref 70–99)

## 2023-09-20 LAB — TYPE AND SCREEN
ABO/RH(D): O POS
Antibody Screen: NEGATIVE
Unit division: 0

## 2023-09-20 LAB — HEMOGLOBIN A1C
Hgb A1c MFr Bld: 6.9 % — ABNORMAL HIGH (ref 4.8–5.6)
Mean Plasma Glucose: 151.33 mg/dL

## 2023-09-20 MED ORDER — INSULIN ASPART 100 UNIT/ML IJ SOLN
0.0000 [IU] | Freq: Three times a day (TID) | INTRAMUSCULAR | Status: DC
  Administered 2023-09-20: 2 [IU] via SUBCUTANEOUS
  Administered 2023-09-21 (×2): 1 [IU] via SUBCUTANEOUS
  Administered 2023-09-22 (×2): 3 [IU] via SUBCUTANEOUS
  Administered 2023-09-22 – 2023-09-23 (×2): 1 [IU] via SUBCUTANEOUS
  Administered 2023-09-23 (×2): 3 [IU] via SUBCUTANEOUS
  Administered 2023-09-24: 2 [IU] via SUBCUTANEOUS

## 2023-09-20 MED ORDER — ALPRAZOLAM 0.5 MG PO TABS: 1.0000 mg | ORAL_TABLET | Freq: Two times a day (BID) | ORAL | Status: DC | PRN

## 2023-09-20 MED ORDER — SODIUM CHLORIDE 0.9 % IV SOLN
300.0000 mg | Freq: Once | INTRAVENOUS | Status: AC
  Administered 2023-09-20: 300 mg via INTRAVENOUS
  Filled 2023-09-20: qty 15

## 2023-09-20 MED ORDER — INSULIN ASPART 100 UNIT/ML IJ SOLN
0.0000 [IU] | Freq: Every day | INTRAMUSCULAR | Status: DC
  Administered 2023-09-23: 3 [IU] via SUBCUTANEOUS

## 2023-09-20 MED ORDER — IRON SUCROSE 300 MG IVPB - SIMPLE MED
300.0000 mg | Freq: Once | Status: DC
  Filled 2023-09-20: qty 265

## 2023-09-20 MED ORDER — PANTOPRAZOLE SODIUM 40 MG IV SOLR
40.0000 mg | Freq: Two times a day (BID) | INTRAVENOUS | Status: DC
  Administered 2023-09-20 – 2023-09-22 (×6): 40 mg via INTRAVENOUS
  Filled 2023-09-20 (×6): qty 10

## 2023-09-20 MED ORDER — ALPRAZOLAM 0.5 MG PO TABS
0.2500 mg | ORAL_TABLET | Freq: Two times a day (BID) | ORAL | Status: DC
  Administered 2023-09-20 – 2023-09-23 (×8): 0.25 mg via ORAL
  Filled 2023-09-20 (×9): qty 1

## 2023-09-20 MED ORDER — SODIUM CHLORIDE 0.9 % IV SOLN
2.0000 g | INTRAVENOUS | Status: DC
  Administered 2023-09-20 – 2023-09-21 (×2): 2 g via INTRAVENOUS
  Filled 2023-09-20 (×2): qty 20

## 2023-09-20 MED ORDER — SODIUM CHLORIDE 0.9 % IV SOLN
500.0000 mg | INTRAVENOUS | Status: DC
  Administered 2023-09-20 – 2023-09-21 (×2): 500 mg via INTRAVENOUS
  Filled 2023-09-20 (×3): qty 5

## 2023-09-20 MED ORDER — ALBUTEROL SULFATE (2.5 MG/3ML) 0.083% IN NEBU: 2.5000 mg | INHALATION_SOLUTION | RESPIRATORY_TRACT | Status: DC | PRN

## 2023-09-20 NOTE — Progress Notes (Signed)
 PROGRESS NOTE  Allison Hughes FMW:[id number] DOB: September 09, 1950   PCP: Valma Carwin, MD  Patient is from: Home.  DOA: 09/19/2023 LOS: 1  Chief complaints Chief Complaint  Patient presents with   Altered Mental Status   Shortness of Breath     Brief Narrative / Interim history: 73 year old F with PMH of severe dementia, COPD, DM-2, CVA, GIB, neuroendocrine ulcer, HTN and recent hospitalization at [facility] for UGIB, hypoxic RF, multifocal pneumonia with parapneumonic effusion, cystitis and delirium brought to ED by EMS after her friend found her lying on floor.  It seems therapy recommended SNF but patient chose to go home with home health.  Per discharge summary, she was evaluated by psychiatry and deemed to have capacity to make her own medical decision and discharged home with home health.  Reportedly had recurrent falls at home, about 5 in a week.  Per EMS, she was hypoxic to 80% on room air.  In ED, stable vitals with saturation in 90s on 3 L.  Hgb 6.6 (was 7.5 on discharge from [facility]).  Hemoccult positive.  UDS positive for benzo.  UA with nitrites.  CXR raise concern for vascular congestion.  CT head without acute finding.  CT angio chest negative for PE but bilateral GGO opacity concerning for atypical pneumonia as well as CHF.  CT abdomen and pelvis showed cholelithiasis without acute cholecystitis.  Patient was admitted for ABLA, acute respiratory failure due to pneumonia and acute metabolic encephalopathy.  1 unit of blood ordered.  Started on IV antibiotics and Protonix .  GI consulted   Subjective: Seen and examined earlier this morning.  No major events overnight or this morning.  Patient is awake and alert but only oriented to self.  She thinks she is at discharge.  Has no complaints.  Denies pain, nausea, vomiting or shortness of breath.  Objective: Vitals:   09/20/23 0419 09/20/23 0530 09/20/23 1002 09/20/23 1304  BP:  129/61 (!) 122/51 121/63  Pulse:  84 82 87  Resp:  20 (!)  21 20  Temp:  99.1 F (37.3 C) 98.5 F (36.9 C) 98.7 F (37.1 C)  TempSrc:  Oral Oral Oral  SpO2:  94% (!) 88% 93%  Weight: 80.7 kg     Height: 5' 2 (1.575 m)       Examination:  GENERAL: No apparent distress.  Nontoxic. HEENT: MMM.  Vision and hearing grossly intact.  NECK: Supple.  No apparent JVD.  RESP:  No IWOB.  Fair aeration bilaterally. CVS:  RRR. Heart sounds normal.  ABD/GI/GU: BS+. Abd soft, NTND.  MSK/EXT:  Moves extremities. No apparent deformity. No edema.  SKIN: no apparent skin lesion or wound NEURO: AA.  Oriented only to self.  She thinks she is at charge.  Follows commands.  No apparent focal neuro deficit. PSYCH: Calm. Normal affect.   Consultants:  Gastroenterology  Procedures: None  Microbiology summarized: COVID-19, influenza, RSV and  a 20 pathogen RVP nonreactive. Urine culture pending  Assessment and plan: Acute hypoxic respiratory failure secondary to multifocal pneumonia COPD-not in acute exacerbation -Hypoxic to 80% when EMS arrived at home. -CT angio chest with bilateral GGO opacity.  BNP only 78. -Continue ceftriaxone  and Zithromax  -Continue bronchodilator -Wean oxygen as able  Acute blood loss anemia in a patient with Neuroendocrine ulcer: Recently hospitalized at [facility] and had EGD that demonstrated clean-based gastric neuroendocrine ulcer.  Hemoccult positive.  Seems to be on aspirin  and Plavix  at home.  It seems she was advised to hold antiplatelets  until outpatient GI evaluation.  Unclear if she resume them or not.  Hgb improved after 1 unit and stable for Recent Labs    09/19/23 1836 09/19/23 1845 09/19/23 1914 09/20/23 0435  HGB 6.6* 8.5* 7.5* 7.5*  - Continue holding antiplatelet - Follow GI recommendation - Monitor H&H - Continue Protonix  - N.p.o. except sips with meds.   Acute metabolic encephalopathy likely secondary to above but she could also have some cognitive impairment.  CT head without acute finding.  She is  currently awake and alert but only oriented to self.  She thinks she is at discharge.  She had normal B12, folate and TSH at [facility].  She is on Xanax  and Seroquel  at home.  Today is positive for benzo. - Will resume Xanax  at 0.25 mg twice daily to prevent withdrawal.  Takes 1 mg twice daily at home - Continue holding Seroquel   Chronic HFpEF: TTE in 2024 with LVEF of 65 to 70% and G1 DD.  BNP 1878.  Does not seem to be on diuretics at home.  Appears euvolemic on exam - Monitor fluid and respiratory status clinically  NIDDM-2: A1c 6.9%.  On metformin  at home.  Blood glucose within acceptable range. Recent Labs  Lab 09/19/23 1819 09/20/23 1007 09/20/23 1303  GLUCAP 97 151* 110*  - Continue SSI-sensitive   Recurrent fall: Found down on the floor by good friend.  Previously recommended SNF but she refused and went home.  At that time, she was deemed to have capacity by psych.  She definitely has no capacity at this time.  I think she needs SNF placement. -Fall precaution -PT/OT eval - TOC consulted.  Essential hypertension: Normotensive. -Continue holding home lisinopril    Hyperlipidemia -Resume home statin once able to take p.o. Class I obesity Body mass index is 32.56 kg/m.           DVT prophylaxis:  SCDs Start: 09/19/23 2320  Code Status: Full code by default Family Communication: Patient has no close family member other than a good friend who checks on her..  Had 1 daughter who passed away.  Estranged from brother. Level of care: Telemetry Medical Status is: Inpatient Remains inpatient appropriate because: GI bleed, pneumonia and respiratory failure   Final disposition: To be determined   55 minutes with more than 50% spent in reviewing records, counseling patient/family and coordinating care.   Sch Meds:  Scheduled Meds:  insulin  aspart  0-5 Units Subcutaneous QHS   insulin  aspart  0-9 Units Subcutaneous TID WC   pantoprazole  (PROTONIX ) IV  40 mg Intravenous  Q12H   Continuous Infusions:  azithromycin      cefTRIAXone  (ROCEPHIN )  IV     PRN Meds:.albuterol   Antimicrobials: Anti-infectives (From admission, onward)    Start     Dose/Rate Route Frequency Ordered Stop   09/20/23 1830  azithromycin  (ZITHROMAX ) 500 mg in sodium chloride  0.9 % 250 mL IVPB        500 mg 250 mL/hr over 60 Minutes Intravenous Every 24 hours 09/20/23 0035     09/20/23 1800  cefTRIAXone  (ROCEPHIN ) 2 g in sodium chloride  0.9 % 100 mL IVPB        2 g 200 mL/hr over 30 Minutes Intravenous Every 24 hours 09/20/23 0035     09/19/23 2015  cefTRIAXone  (ROCEPHIN ) 1 g in sodium chloride  0.9 % 100 mL IVPB        1 g 200 mL/hr over 30 Minutes Intravenous  Once 09/19/23 2011 09/19/23 2159   09/19/23  2015  azithromycin  (ZITHROMAX ) 500 mg in sodium chloride  0.9 % 250 mL IVPB        500 mg 250 mL/hr over 60 Minutes Intravenous  Once 09/19/23 2011 09/19/23 2315        I have personally reviewed the following labs and images: CBC: Recent Labs  Lab 09/19/23 1836 09/19/23 1845 09/19/23 1914 09/20/23 0435  WBC 8.4  --   --  7.3  NEUTROABS 6.8  --   --   --   HGB 6.6* 8.5* 7.5* 7.5*  HCT 24.9* 25.0* 22.0* 26.7*  MCV 73.2*  --   --  72.8*  PLT 460*  --   --  328   BMP &GFR Recent Labs  Lab 09/19/23 1828 09/19/23 1845 09/19/23 1914 09/20/23 0435  NA 141 141 142 143  K 3.7 4.1 3.7 3.8  CL 107 109  --  106  CO2 23  --   --  22  GLUCOSE 114* 101*  --  137*  BUN 10 10  --  10  CREATININE 0.87 0.80  --  0.80  CALCIUM  8.7*  --   --  8.5*   Estimated Creatinine Clearance: 62.5 mL/min (by C-G formula based on SCr of 0.8 mg/dL). Liver & Pancreas: Recent Labs  Lab 09/19/23 1828  AST 20  ALT 28  ALKPHOS 138*  BILITOT 0.7  PROT 6.6  ALBUMIN 2.8*   Recent Labs  Lab 09/19/23 1836  LIPASE 22   Recent Labs  Lab 09/19/23 1902  AMMONIA <13   Diabetic: Recent Labs    09/20/23 0435  HGBA1C 6.9*   Recent Labs  Lab 09/19/23 1819 09/20/23 1007  09/20/23 1303  GLUCAP 97 151* 110*   Cardiac Enzymes: Recent Labs  Lab 09/19/23 1828  CKTOTAL 31*   No results for input(s): PROBNP in the last 8760 hours. Coagulation Profile: No results for input(s): INR, PROTIME in the last 168 hours. Thyroid Function Tests: No results for input(s): TSH, T4TOTAL, FREET4, T3FREE, THYROIDAB in the last 72 hours. Lipid Profile: No results for input(s): CHOL, HDL, LDLCALC, TRIG, CHOLHDL, LDLDIRECT in the last 72 hours. Anemia Panel: Recent Labs    09/19/23 1943  VITAMINB12 432  FOLATE 19.4  FERRITIN 13  TIBC 361  IRON  <10*  RETICCTPCT 1.5   Urine analysis:    Component Value Date/Time   COLORURINE YELLOW 09/19/2023 2004   APPEARANCEUR HAZY (A) 09/19/2023 2004   LABSPEC 1.027 09/19/2023 2004   PHURINE 5.0 09/19/2023 2004   GLUCOSEU NEGATIVE 09/19/2023 2004   HGBUR NEGATIVE 09/19/2023 2004   BILIRUBINUR NEGATIVE 09/19/2023 2004   KETONESUR NEGATIVE 09/19/2023 2004   PROTEINUR NEGATIVE 09/19/2023 2004   NITRITE POSITIVE (A) 09/19/2023 2004   LEUKOCYTESUR SMALL (A) 09/19/2023 2004   Sepsis Labs: Invalid input(s): PROCALCITONIN, LACTICIDVEN  Microbiology: Recent Results (from the past 240 hours)  Resp panel by RT-PCR (RSV, Flu A&B, Covid) Anterior Nasal Swab     Status: None   Collection Time: 09/19/23  6:35 PM   Specimen: Anterior Nasal Swab  Result Value Ref Range Status   SARS Coronavirus 2 by RT PCR NEGATIVE NEGATIVE Final   Influenza A by PCR NEGATIVE NEGATIVE Final   Influenza B by PCR NEGATIVE NEGATIVE Final    Comment: (NOTE) The Xpert Xpress SARS-CoV-2/FLU/RSV plus assay is intended as an aid in the diagnosis of influenza from Nasopharyngeal swab specimens and should not be used as a sole basis for treatment. Nasal washings and aspirates are unacceptable for Xpert  Xpress SARS-CoV-2/FLU/RSV testing.  Fact Sheet for Patients: BloggerCourse.com  Fact Sheet for  Healthcare Providers: SeriousBroker.it  This test is not yet approved or cleared by the [place]  FDA and has been authorized for detection and/or diagnosis of SARS-CoV-2 by FDA under an Emergency Use Authorization (EUA). This EUA will remain in effect (meaning this test can be used) for the duration of the COVID-19 declaration under Section 564(b)(1) of the Act, 21 U.S.C. section 360bbb-3(b)(1), unless the authorization is terminated or revoked.     Resp Syncytial Virus by PCR NEGATIVE NEGATIVE Final    Comment: (NOTE) Fact Sheet for Patients: BloggerCourse.com  Fact Sheet for Healthcare Providers: SeriousBroker.it  This test is not yet approved or cleared by the [place]  FDA and has been authorized for detection and/or diagnosis of SARS-CoV-2 by FDA under an Emergency Use Authorization (EUA). This EUA will remain in effect (meaning this test can be used) for the duration of the COVID-19 declaration under Section 564(b)(1) of the Act, 21 U.S.C. section 360bbb-3(b)(1), unless the authorization is terminated or revoked.  Performed at [facility], [address] [place], [place] [address]   Respiratory (~20 pathogens) panel by PCR     Status: None   Collection Time: 09/20/23  2:08 AM   Specimen: Nasopharyngeal Swab; Respiratory  Result Value Ref Range Status   Adenovirus NOT DETECTED NOT DETECTED Final   Coronavirus 229E NOT DETECTED NOT DETECTED Final    Comment: (NOTE) The Coronavirus on the Respiratory Panel, DOES NOT test for the novel  Coronavirus (2019 nCoV)    Coronavirus HKU1 NOT DETECTED NOT DETECTED Final   Coronavirus NL63 NOT DETECTED NOT DETECTED Final   Coronavirus OC43 NOT DETECTED NOT DETECTED Final   Metapneumovirus NOT DETECTED NOT DETECTED Final   Rhinovirus / Enterovirus NOT DETECTED NOT DETECTED Final   Influenza A NOT DETECTED NOT DETECTED Final   Influenza B  NOT DETECTED NOT DETECTED Final   Parainfluenza Virus 1 NOT DETECTED NOT DETECTED Final   Parainfluenza Virus 2 NOT DETECTED NOT DETECTED Final   Parainfluenza Virus 3 NOT DETECTED NOT DETECTED Final   Parainfluenza Virus 4 NOT DETECTED NOT DETECTED Final   Respiratory Syncytial Virus NOT DETECTED NOT DETECTED Final   Bordetella pertussis NOT DETECTED NOT DETECTED Final   Bordetella Parapertussis NOT DETECTED NOT DETECTED Final   Chlamydophila pneumoniae NOT DETECTED NOT DETECTED Final   Mycoplasma pneumoniae NOT DETECTED NOT DETECTED Final    Comment: Performed at [facility], [address] [place], [place] [address]    Radiology Studies: CT Angio Chest PE W and/or Wo Contrast Result Date: 09/19/2023 EXAM: CTA CHEST PE WITH/WITHOUT AND WITH CONTRAST CT ABDOMEN AND PELVIS WITH/WITHOUT AND WITH CONTRAST 09/19/2023 07:32:11 PM TECHNIQUE: CTA of the chest was performed after the administration of intravenous contrast (iohexol  (OMNIPAQUE ) 350 MG/ML injection 75 mL). Multiplanar reformatted images are provided for review. MIP images are provided for review. CT of the abdomen and pelvis was performed with the administration of intravenous contrast. Automated exposure control, iterative reconstruction, and/or weight based adjustment of the mA/kV was utilized to reduce the radiation dose to as low as reasonably achievable. COMPARISON: Comparison with same day chest radiograph; CT chest pelvis 13/20/25. CLINICAL HISTORY: Pulmonary embolism (PE) suspected, high prob. Pt AMS, unable to follow breathing instructions at this time. Abdominal pain. Shortness of breath. FINDINGS: CHEST: PULMONARY ARTERIES: Pulmonary arteries are adequately opacified for evaluation. No intraluminal filling defect to suggest pulmonary embolism. MEDIASTINUM: New mediastinal and  hilar lymphadenopathy. For example, a AP window node on series 6 image 122 measuring 1.3 cm and a 1.3 cm right hilar node on series 6 image 159 measuring  1.3 cm. The heart and pericardium demonstrate no acute abnormality. There is no acute abnormality of the thoracic aorta. Coronary artery atherosclerotic calcifications are present. LUNGS AND PLEURA: Diffuse bilateral ground-glass opacities with interlobular septal thickening and small bilateral pleural effusions most suggestive of congestive heart failure. Atelectasis in the lower lobes. No pneumothorax. SOFT TISSUES AND BONES: No acute bone or soft tissue abnormality. ABDOMEN AND PELVIS: LIVER: Hepatic steatosis. GALLBLADDER AND BILE DUCTS: Cholelithiasis. No evidence of acute cholecystitis. No biliary ductal dilatation. SPLEEN: Spleen demonstrates no acute abnormality. PANCREAS: Pancreas demonstrates no acute abnormality. 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 No follow-up recommended. Adrenal glands are otherwise unremarkable. KIDNEYS, URETERS AND BLADDER: No stones in the kidneys or ureters. No hydronephrosis. No perinephric or periureteral stranding. Urinary bladder is unremarkable. GI AND BOWEL: Stomach and duodenal sweep demonstrate no acute abnormality. There is no bowel obstruction. No abnormal bowel wall thickening or distension. REPRODUCTIVE: Reproductive organs are unremarkable. PERITONEUM AND RETROPERITONEUM: No ascites or free air. LYMPH NODES: No lymphadenopathy in the abdomen or pelvis . BONES AND SOFT TISSUES: No acute abnormality of the visualized bones. No focal soft tissue abnormality. IMPRESSION: 1. No pulmonary embolism. 2. Diffuse bilateral ground-glass opacities with interlobular septal thickening and small bilateral pleural effusions, most suggestive of congestive heart failure. Atypical pneumonia could appear similarly. 3. New mediastinal and hilar lymphadenopathy, likely reactive. Continue observation on follow-up . 4. Hepatic steatosis. 5. Cholelithiasis without evidence of acute cholecystitis. Electronically signed by: Norman Gatlin MD 09/19/2023 07:57 PM EDT RP  Workstation: [id number]   CT ABDOMEN PELVIS W CONTRAST Result Date: 09/19/2023 EXAM: CTA CHEST PE WITH/WITHOUT AND WITH CONTRAST CT ABDOMEN AND PELVIS WITH/WITHOUT AND WITH CONTRAST 09/19/2023 07:32:11 PM TECHNIQUE: CTA of the chest was performed after the administration of intravenous contrast (iohexol  (OMNIPAQUE ) 350 MG/ML injection 75 mL). Multiplanar reformatted images are provided for review. MIP images are provided for review. CT of the abdomen and pelvis was performed with the administration of intravenous contrast. Automated exposure control, iterative reconstruction, and/or weight based adjustment of the mA/kV was utilized to reduce the radiation dose to as low as reasonably achievable. COMPARISON: Comparison with same day chest radiograph; CT chest pelvis 13/20/25. CLINICAL HISTORY: Pulmonary embolism (PE) suspected, high prob. Pt AMS, unable to follow breathing instructions at this time. Abdominal pain. Shortness of breath. FINDINGS: CHEST: PULMONARY ARTERIES: Pulmonary arteries are adequately opacified for evaluation. No intraluminal filling defect to suggest pulmonary embolism. MEDIASTINUM: New mediastinal and hilar lymphadenopathy. For example, a AP window node on series 6 image 122 measuring 1.3 cm and a 1.3 cm right hilar node on series 6 image 159 measuring 1.3 cm. The heart and pericardium demonstrate no acute abnormality. There is no acute abnormality of the thoracic aorta. Coronary artery atherosclerotic calcifications are present. LUNGS AND PLEURA: Diffuse bilateral ground-glass opacities with interlobular septal thickening and small bilateral pleural effusions most suggestive of congestive heart failure. Atelectasis in the lower lobes. No pneumothorax. SOFT TISSUES AND BONES: No acute bone or soft tissue abnormality. ABDOMEN AND PELVIS: LIVER: Hepatic steatosis. GALLBLADDER AND BILE DUCTS: Cholelithiasis. No evidence of acute cholecystitis. No biliary ductal dilatation. SPLEEN: Spleen  demonstrates no acute abnormality. PANCREAS: Pancreas demonstrates no acute abnormality. 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 No follow-up recommended. Adrenal glands are otherwise unremarkable. KIDNEYS, URETERS AND  BLADDER: No stones in the kidneys or ureters. No hydronephrosis. No perinephric or periureteral stranding. Urinary bladder is unremarkable. GI AND BOWEL: Stomach and duodenal sweep demonstrate no acute abnormality. There is no bowel obstruction. No abnormal bowel wall thickening or distension. REPRODUCTIVE: Reproductive organs are unremarkable. PERITONEUM AND RETROPERITONEUM: No ascites or free air. LYMPH NODES: No lymphadenopathy in the abdomen or pelvis . BONES AND SOFT TISSUES: No acute abnormality of the visualized bones. No focal soft tissue abnormality. IMPRESSION: 1. No pulmonary embolism. 2. Diffuse bilateral ground-glass opacities with interlobular septal thickening and small bilateral pleural effusions, most suggestive of congestive heart failure. Atypical pneumonia could appear similarly. 3. New mediastinal and hilar lymphadenopathy, likely reactive. Continue observation on follow-up . 4. Hepatic steatosis. 5. Cholelithiasis without evidence of acute cholecystitis. Electronically signed by: Norman Gatlin MD 09/19/2023 07:57 PM EDT RP Workstation: [id number]   CT Head Wo Contrast Result Date: 09/19/2023 CLINICAL DATA:  [facility] EXAM: CT HEAD WITHOUT CONTRAST TECHNIQUE: Contiguous axial images were obtained from the base of the skull through the vertex without intravenous contrast. RADIATION DOSE REDUCTION: This exam was performed according to the departmental dose-optimization program which includes automated exposure control, adjustment of the mA and/or kV according to patient size and/or use of iterative reconstruction technique. COMPARISON:  MRI head Jun 04, 2022.  CT head 08/01/2023. FINDINGS: Brain: Similar remote infarct in the right parietal lobe with a  severe patchy and confluent white matter hypoattenuation, compatible with chronic microvascular ischemic change. Additional remote infarct in the left cerebellum. Chronic basal ganglia calcifications. No evidence of acute large vascular territory infarct, acute hemorrhage, mass lesion or midline shift. Vascular: Calcific atherosclerosis.  No hyperdense vessel. Skull: No acute fracture. Sinuses/Orbits: Clear sinuses.  No acute orbital findings. Other: No mastoid effusions. IMPRESSION: 1. No evidence of acute intracranial abnormality. 2. 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 Electronically Signed   By: Gilmore GORMAN Molt M.D.   On: 09/19/2023 19:44   DG Chest Portable 1 View Result Date: 09/19/2023 CLINICAL DATA:  Short of breath EXAM: PORTABLE CHEST 1 VIEW COMPARISON:  08/28/2023 FINDINGS: Single frontal view of the chest demonstrates a stable cardiac silhouette. There is increased pulmonary vascular congestion, with basilar predominant interstitial and airspace opacities compatible with edema. No large effusion or pneumothorax. No acute bony abnormalities. IMPRESSION: 1. Findings most consistent with congestive heart failure and mild pulmonary edema. Electronically Signed   By: Ozell Daring M.D.   On: 09/19/2023 19:40      Balian Schaller T. Jourdain Guay [facility]  If 7PM-7AM, please contact night-coverage [email or web address] 09/20/2023, 1:55 PM

## 2023-09-20 NOTE — Progress Notes (Signed)
 New Admission Note:   Arrival Method: Via Stretcher From ED Mental Orientation:  A & O x 1 Telemetry: Box 5M01 Assessment: Completed Skin:  Intact except for small scab to right shin and MASD beneath breast folds IV:  RT FA and LT FA Pain: Denies Tubes:  None Safety Measures: Patient confused and impulsive.  Bed Alarm set and patient's room is near the nursing desk. Admission: Completed 5 MW Orientation: Patient has been orientated to the room, unit and staff.  Family:  None at bedside  Patient only has clothing at bedside.  She also has a necklace and ring on.  She states she wears glasses but they are at home.  Orders have been reviewed and implemented. Will continue to monitor the patient. Call light has been placed within reach and bed alarm has been activated.   [name] RN Phone number: [phone]

## 2023-09-20 NOTE — Consult Note (Signed)
 Consultation Note   Referring Provider:  [facility] PCP: Valma Carwin, MD Primary Gastroenterologist:    Belvie Just, MD     Reason for Consultation: Worsening anemia DOA: 09/19/2023         Hospital Day: 2   ASSESSMENT    73 year old female with recurrent acute on chronic anemia / FOBT positive.  Hospitalized twice at [facility] within the last 6 weeks During both admissions she had worsening anemia and was diagnosed with clean-based gastric ulcers and a small low-grade gastric neuroendocrine tumor.  Plan was for further workup per her primary GI (Dr. Just).  There was concern that the recurring anemia could be from small bowel AVMs or other source  Hgb was 6.6 in ED (down from 7.5 upon discharge from [facility]).  Hemoglobin up to 7.5 today after unit of blood. Denies overt GI blood loss but patient has some confusions.   Acute hypoxic respiratory failure secondary to multifocal pneumonia.   Recently admitted to [facility] for same  Acute metabolic enc questionable vascular same thing if we do a visit I did review ephalopathy secondary to acute illness, ?  Some component of cognitive decline and medication effect (benzodiazepines)  Low-grade neuroendocrine tumor of the stomach This was recently diagnosed at [facility] during an admission for respiratory failure when she was found to have upper GI bleeding / acute on chronic anemia.  She has an outpatient referral to Oncology  History of CVA ? Reason for plavix  and asa  DM2 Obesity  Cholelithiasis  COPD  Chronic HfpEF  See PMH for additional medical problems  Principal Problem:   ABLA (acute blood loss anemia) Active Problems:   Essential hypertension   DM (diabetes mellitus), type 2 (HCC)   Impaired mobility and ADLs   Neurocognitive disorder   Neuroendocrine carcinoma (HCC)   Acute respiratory failure with hypoxia (HCC)      PLAN:   --Twice daily  PPI -- Monitor H&H -- No plans for repeat EGD at this point -- Patient's primary GI, Dr. Just will assume her care on Monday.  He can decide on whether to pursue additional endoscopic evaluation  ( small bowel evaluation)    HPI   Patient is followed by Dr. Just for history of GERD, IDA and neuroendocrine tumor of the stomach.    Admission-[facility] July 2025 Hospitalized with respiratory failure.  Discharge summary in Care Everywhere reviewed.  During that admission she was found to have acute on chronic anemia with hemoglobin 6.7 requiring blood transfusion.  She underwent inpatient EGD  08/05/2023 EGD The esophagus appeared normal.  Abnormal mucosa in the stomach. Single round cratered clean-base ulcer (7 mm) is identified at distal gastric body (greater curve).  Biopsies are obtained.  Antrum has diffuse linear ulcers with clean base.  Biopsies are obtained.  Performed forceps biopsies in the body of the stomach, incisura and antrum to rule out H. pylori . The duodenal bulb and 2nd part of the duodenum appeared normal. Performed random biopsy using biopsy forceps to rule out celiac disease. Biopsies:  Random gastric biopsies and antral ulcer biopsies consistent with mild reactive gastropathy.  There was no H. pylori.  The ulcer in the body of the stomach was a  low-grade neuroendocrine tumor.  Duodenal biopsies were unremarkable  Readmission -[facility] August 2025 Patient was readmitted with acute respiratory failure / pneumonia /parapneumonic pleural effusion, worsening anemia requiring another blood transfusion.  The discharge summary from 09/01/2023 was reviewed in Care Everywhere. During that admission she did have a modified barium swallow which was negative for aspiration.  For recurrent anemia GI was consulted . She had another EGD performed. Ulcer seem to be smaller.  EGD findings did not account for the recurrent decline in hemoglobin.  It was recommended patient follow-up with her primary  GI to investigate other etiologies of recurrent anemia such as small bowel AVMs.   08/29/2023 EGD Findings  The esophagus appeared normal.  4 mm x 4 mm cratered ulcer in the body of the stomach and greater curve of  the stomach. Known neuroendocrine ulcer is identified at distal body.   Repeat biopsies are done.  Ulcer appears to be smaller  The cardia, incisura and antrum appeared normal. Hill flap valve grade I  Performed forceps biopsies in the body of the stomach, incisura and antrum. Biopsies are obtained according with Donna protocol  the duodenal bulb and 2nd part of the duodenum appeared normal.  Biopsies :   Antral biopsies showed chronic gastritis.  No H. pylori.  Incisura biopsies showed chronic gastritis, no H. pylori.  Body of the stomach biopsy showed mild chronic gastritis, no H. pylori.  Stomach body ulcer showed gastric mucosa with focal low-grade neuroendocrine tumor.   Hemoglobin on 08/31/2023 in Care Everywhere was 7.5   INTERVAL HISTORY Notes reviewed in Care Everywhere.  It appears patient was seen by  Dr.Hung in his office on 09/17/2023 for hospital follow-up .  He felt the anemia was probably related to the ulcer  /neuroendocrine tumor in her stomach .  He referred her to oncology .  Patient was patient brought to the ED yesterday by friends after she was found laying on the floor.  Her O2 sat was low.  She was admitted with confusion and multiple falls at home.  In the ED patient was found to have recurrent acute on chronic anemia with a hemoglobin of 6.6.    *Patient with some confusion so history may not be reliable. She describes having black stools while taking iron  but no iron  or black stools lately (cannot tell me how long it has been).  She tells me that she does take pantoprazole  twice a day.     Data reviewed Alk phos 138, remainder of LFTs remain normal.  WBC 8.4 Hgb 6.6 in ED (down from 7.5 upon discharge from [facility]).  Hemoglobin up to 7.5 today after unit of  blood.  MCV 73  Chest x-ray showing pulmonary vascular congestion CT angio of the chest concerning for atypical pneumonia as well as CHF CTAP showing cholelithiasis without evidence of acute cholecystitis    Recent Labs    09/19/23 1828  PROT 6.6  ALBUMIN 2.8*  AST 20  ALT 28  ALKPHOS 138*  BILITOT 0.7   Recent Labs    09/19/23 1836 09/19/23 1845 09/19/23 1914 09/20/23 0435  WBC 8.4  --   --  7.3  HGB 6.6* 8.5* 7.5* 7.5*  HCT 24.9* 25.0* 22.0* 26.7*  MCV 73.2*  --   --  72.8*  PLT 460*  --   --  328   Recent Labs    09/19/23 1828 09/19/23 1845 09/19/23 1914 09/20/23 0435  NA 141 141 142 143  K 3.7 4.1 3.7  3.8  CL 107 109  --  106  CO2 23  --   --  22  GLUCOSE 114* 101*  --  137*  BUN 10 10  --  10  CREATININE 0.87 0.80  --  0.80  CALCIUM  8.7*  --   --  8.5*     CT ABDOMEN PELVIS W CONTRAST EXAM: CTA CHEST PE WITH/WITHOUT AND WITH CONTRAST CT ABDOMEN AND PELVIS WITH/WITHOUT AND WITH CONTRAST 09/19/2023 07:32:11 PM  TECHNIQUE: CTA of the chest was performed after the administration of intravenous contrast (iohexol  (OMNIPAQUE ) 350 MG/ML injection 75 mL). Multiplanar reformatted images are provided for review. MIP images are provided for review. CT of the abdomen and pelvis was performed with the administration of intravenous contrast. Automated exposure control, iterative reconstruction, and/or weight based adjustment of the mA/kV was utilized to reduce the radiation dose to as low as reasonably achievable.  COMPARISON: Comparison with same day chest radiograph; CT chest pelvis 13/20/25.  CLINICAL HISTORY: Pulmonary embolism (PE) suspected, high prob. Pt AMS, unable to follow breathing instructions at this time. Abdominal pain. Shortness of breath.  FINDINGS:  CHEST:  PULMONARY ARTERIES: Pulmonary arteries are adequately opacified for evaluation. No intraluminal filling defect to suggest pulmonary embolism.  MEDIASTINUM: New mediastinal and  hilar lymphadenopathy. For example, a AP window node on series 6 image 122 measuring 1.3 cm and a 1.3 cm right hilar node on series 6 image 159 measuring 1.3 cm. The heart and pericardium demonstrate no acute abnormality. There is no acute abnormality of the thoracic aorta. Coronary artery atherosclerotic calcifications are present.  LUNGS AND PLEURA: Diffuse bilateral ground-glass opacities with interlobular septal thickening and small bilateral pleural effusions most suggestive of congestive heart failure. Atelectasis in the lower lobes. No pneumothorax.  SOFT TISSUES AND BONES: No acute bone or soft tissue abnormality.  ABDOMEN AND PELVIS:  LIVER: Hepatic steatosis.  GALLBLADDER AND BILE DUCTS: Cholelithiasis. No evidence of acute cholecystitis. No biliary ductal dilatation.  SPLEEN: Spleen demonstrates no acute abnormality.  PANCREAS: Pancreas demonstrates no acute abnormality.  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 No follow-up recommended. Adrenal glands are otherwise unremarkable.  KIDNEYS, URETERS AND BLADDER: No stones in the kidneys or ureters. No hydronephrosis. No perinephric or periureteral stranding. Urinary bladder is unremarkable.  GI AND BOWEL: Stomach and duodenal sweep demonstrate no acute abnormality. There is no bowel obstruction. No abnormal bowel wall thickening or distension.  REPRODUCTIVE: Reproductive organs are unremarkable.  PERITONEUM AND RETROPERITONEUM: No ascites or free air.  LYMPH NODES: No lymphadenopathy in the abdomen or pelvis .  BONES AND SOFT TISSUES: No acute abnormality of the visualized bones. No focal soft tissue abnormality.  IMPRESSION: 1. No pulmonary embolism. 2. Diffuse bilateral ground-glass opacities with interlobular septal thickening and small bilateral pleural effusions, most suggestive of congestive heart failure. Atypical pneumonia could appear similarly. 3. New mediastinal and  hilar lymphadenopathy, likely reactive. Continue observation on follow-up . 4. Hepatic steatosis. 5. Cholelithiasis without evidence of acute cholecystitis.  Electronically signed by: Norman Gatlin MD 09/19/2023 07:57 PM EDT RP Workstation: [id number] CT Angio Chest PE W and/or Wo Contrast EXAM: CTA CHEST PE WITH/WITHOUT AND WITH CONTRAST CT ABDOMEN AND PELVIS WITH/WITHOUT AND WITH CONTRAST 09/19/2023 07:32:11 PM  TECHNIQUE: CTA of the chest was performed after the administration of intravenous contrast (iohexol  (OMNIPAQUE ) 350 MG/ML injection 75 mL). Multiplanar reformatted images are provided for review. MIP images are provided for review. CT of the abdomen and pelvis was performed with the administration  of intravenous contrast. Automated exposure control, iterative reconstruction, and/or weight based adjustment of the mA/kV was utilized to reduce the radiation dose to as low as reasonably achievable.  COMPARISON: Comparison with same day chest radiograph; CT chest pelvis 13/20/25.  CLINICAL HISTORY: Pulmonary embolism (PE) suspected, high prob. Pt AMS, unable to follow breathing instructions at this time. Abdominal pain. Shortness of breath.  FINDINGS:  CHEST:  PULMONARY ARTERIES: Pulmonary arteries are adequately opacified for evaluation. No intraluminal filling defect to suggest pulmonary embolism.  MEDIASTINUM: New mediastinal and hilar lymphadenopathy. For example, a AP window node on series 6 image 122 measuring 1.3 cm and a 1.3 cm right hilar node on series 6 image 159 measuring 1.3 cm. The heart and pericardium demonstrate no acute abnormality. There is no acute abnormality of the thoracic aorta. Coronary artery atherosclerotic calcifications are present.  LUNGS AND PLEURA: Diffuse bilateral ground-glass opacities with interlobular septal thickening and small bilateral pleural effusions most suggestive of congestive heart failure. Atelectasis in the lower  lobes. No pneumothorax.  SOFT TISSUES AND BONES: No acute bone or soft tissue abnormality.  ABDOMEN AND PELVIS:  LIVER: Hepatic steatosis.  GALLBLADDER AND BILE DUCTS: Cholelithiasis. No evidence of acute cholecystitis. No biliary ductal dilatation.  SPLEEN: Spleen demonstrates no acute abnormality.  PANCREAS: Pancreas demonstrates no acute abnormality.  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 No follow-up recommended. Adrenal glands are otherwise unremarkable.  KIDNEYS, URETERS AND BLADDER: No stones in the kidneys or ureters. No hydronephrosis. No perinephric or periureteral stranding. Urinary bladder is unremarkable.  GI AND BOWEL: Stomach and duodenal sweep demonstrate no acute abnormality. There is no bowel obstruction. No abnormal bowel wall thickening or distension.  REPRODUCTIVE: Reproductive organs are unremarkable.  PERITONEUM AND RETROPERITONEUM: No ascites or free air.  LYMPH NODES: No lymphadenopathy in the abdomen or pelvis .  BONES AND SOFT TISSUES: No acute abnormality of the visualized bones. No focal soft tissue abnormality.  IMPRESSION: 1. No pulmonary embolism. 2. Diffuse bilateral ground-glass opacities with interlobular septal thickening and small bilateral pleural effusions, most suggestive of congestive heart failure. Atypical pneumonia could appear similarly. 3. New mediastinal and hilar lymphadenopathy, likely reactive. Continue observation on follow-up . 4. Hepatic steatosis. 5. Cholelithiasis without evidence of acute cholecystitis.  Electronically signed by: Norman Gatlin MD 09/19/2023 07:57 PM EDT RP Workstation: [id number] CT Head Wo Contrast CLINICAL DATA:  Delirium  EXAM: CT HEAD WITHOUT CONTRAST  TECHNIQUE: Contiguous axial images were obtained from the base of the skull through the vertex without intravenous contrast.  RADIATION DOSE REDUCTION: This exam was performed according to  the departmental dose-optimization program which includes automated exposure control, adjustment of the mA and/or kV according to patient size and/or use of iterative reconstruction technique.  COMPARISON:  MRI head Jun 04, 2022.  CT head 08/01/2023.  FINDINGS: Brain: Similar remote infarct in the right parietal lobe with a severe patchy and confluent white matter hypoattenuation, compatible with chronic microvascular ischemic change. Additional remote infarct in the left cerebellum. Chronic basal ganglia calcifications. No evidence of acute large vascular territory infarct, acute hemorrhage, mass lesion or midline shift.  Vascular: Calcific atherosclerosis.  No hyperdense vessel.  Skull: No acute fracture.  Sinuses/Orbits: Clear sinuses.  No acute orbital findings.  Other: No mastoid effusions.  IMPRESSION: 1. No evidence of acute intracranial abnormality. 2. 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  Electronically Signed   By: Gilmore GORMAN Molt M.D.   On: 09/19/2023 19:44 DG Chest Portable 1 View CLINICAL  DATA:  Short of breath  EXAM: PORTABLE CHEST 1 VIEW  COMPARISON:  08/28/2023  FINDINGS: Single frontal view of the chest demonstrates a stable cardiac silhouette. There is increased pulmonary vascular congestion, with basilar predominant interstitial and airspace opacities compatible with edema. No large effusion or pneumothorax. No acute bony abnormalities.  IMPRESSION: 1. Findings most consistent with congestive heart failure and mild pulmonary edema.  Electronically Signed   By: Ozell Daring M.D.   On: 09/19/2023 19:40           Past Medical History:  Diagnosis Date   Acute pyelonephritis 02/02/2016   Diabetes mellitus 01/15/2004   T2DM   Hyperlipidemia    Hypertension    Obesity (BMI 30-39.9)    Pneumonia 09/06/2020   Sepsis (HCC) 02/02/2016   Stroke [facility])     Past Surgical History:  Procedure Laterality  Date   CESAREAN SECTION     LOOP RECORDER INSERTION N/A 09/08/2020   Procedure: LOOP RECORDER INSERTION;  Surgeon: Inocencio Soyla Lunger, MD;  Location: [facility] INVASIVE CV LAB;  Service: Cardiovascular;  Laterality: N/A;   ORIF ANKLE FRACTURE  05/11/2011   Procedure: OPEN REDUCTION INTERNAL FIXATION (ORIF) ANKLE FRACTURE;  Surgeon: Jerona LULLA Sage, MD;  Location: [facility] OR;  Service: Orthopedics;  Laterality: Right;   TUBAL LIGATION      Family History  Problem Relation Age of Onset   Drug abuse Daughter    Diabetes Daughter     Prior to Admission medications   Medication Sig Start Date End Date Taking? Authorizing Provider  albuterol  (VENTOLIN  HFA) 108 (90 Base) MCG/ACT inhaler Inhale 2 puffs into the lungs every 6 (six) hours as needed for wheezing or shortness of breath. 06/06/22  Yes Ghimire, Donalda HERO, MD  ALPRAZolam  (XANAX ) 1 MG tablet Take 1 mg by mouth 2 (two) times daily as needed for anxiety or sleep.   Yes [provider]  atorvastatin  (LIPITOR) 40 MG tablet Take 40 mg by mouth daily. 08/06/18  Yes [provider]  BREZTRI  AEROSPHERE 160-9-4.8 MCG/ACT AERO inhaler Inhale 2 puffs into the lungs 2 (two) times daily. 06/24/23  Yes [provider]  insulin  lispro (HUMALOG) 100 UNIT/ML injection Inject 12 Units into the skin 3 (three) times daily before meals.   Yes [provider]  lisinopril  (PRINIVIL ,ZESTRIL ) 20 MG tablet Take 20 mg by mouth at bedtime.   Yes [provider]  metFORMIN  (GLUCOPHAGE -XR) 500 MG 24 hr tablet Take 500 mg by mouth at bedtime. 10/03/20  Yes [provider]  omeprazole (PRILOSEC) 40 MG capsule Take 40 mg by mouth daily. 08/08/23 11/06/23 Yes [provider]  TRESIBA  100 UNIT/ML SOLN Inject 35 Units into the skin at bedtime. 08/08/23  Yes [provider]  aspirin  EC 81 MG tablet Take 81 mg by mouth daily. Patient not taking: Reported on 09/20/2023    [provider]  clopidogrel  (PLAVIX ) 75 MG  tablet Take 1 tablet (75 mg total) by mouth daily. Patient not taking: Reported on 09/20/2023 06/06/22   Raenelle Donalda HERO, MD  OZEMPIC, 2 MG/DOSE, 8 MG/3ML SOPN Inject 2 mg into the skin once a week. Patient not taking: Reported on 09/20/2023 06/26/23   [provider]  QUEtiapine  (SEROQUEL ) 25 MG tablet Take 25 mg by mouth at bedtime. Patient not taking: Reported on 09/20/2023 08/08/23   [provider]    Current Facility-Administered Medications  Medication Dose Route Frequency Provider Last Rate Last Admin   albuterol  (PROVENTIL ) (2.5 MG/3ML) 0.083% nebulizer  solution 2.5 mg  2.5 mg Nebulization Q4H PRN Dorinda Drue DASEN, MD       ALPRAZolam  (XANAX ) tablet 0.25 mg  0.25 mg Oral BID Kathrin Simmer T, MD   0.25 mg at 09/20/23 1432   azithromycin  (ZITHROMAX ) 500 mg in sodium chloride  0.9 % 250 mL IVPB  500 mg Intravenous Q24H Dorinda Drue T, MD       cefTRIAXone  (ROCEPHIN ) 2 g in sodium chloride  0.9 % 100 mL IVPB  2 g Intravenous Q24H Dorinda Drue DASEN, MD       insulin  aspart (novoLOG ) injection 0-5 Units  0-5 Units Subcutaneous QHS Dorinda Drue T, MD       insulin  aspart (novoLOG ) injection 0-9 Units  0-9 Units Subcutaneous TID WC Dorinda Drue DASEN, MD   2 Units at 09/20/23 1012   pantoprazole  (PROTONIX ) injection 40 mg  40 mg Intravenous Q12H Dorinda Drue T, MD   40 mg at 09/20/23 1012    Allergies as of 09/19/2023 - Review Complete 09/19/2023  Allergen Reaction Noted   Iodine Swelling 07/31/2010   Shrimp [shellfish allergy] Swelling 05/11/2011    Social History   Socioeconomic History   Marital status: Divorced    Spouse name: Not on file   Number of children: Not on file   Years of education: Not on file   Highest education level: Not on file  Occupational History   Not on file  Tobacco Use   Smoking status: Every Day    Current packs/day: 1.00    Average packs/day: 1 pack/day for 40.0 years (40.0 ttl pk-yrs)    Types: Cigarettes   Smokeless tobacco: Former    Quit date:  05/11/2011  Substance and Sexual Activity   Alcohol use: No   Drug use: No   Sexual activity: Never  Other Topics Concern   Not on file  Social History Narrative   Not on file   Social Drivers of Health   Financial Resource Strain: Not on file  Food Insecurity: Low Risk  (08/17/2023)   Received from [facility]   Hunger Vital Sign    Within the past 12 months, you worried that your food would run out before you got money to buy more: Never true    Within the past 12 months, the food you bought just didn't last and you didn't have money to get more. : Never true  Transportation Needs: No Transportation Needs (08/17/2023)   Received from [employer]    In the past 12 months, has lack of reliable transportation kept you from medical appointments, meetings, work or from getting things needed for daily living? : No  Physical Activity: Not on file  Stress: Not on file  Social Connections: Not on file  Intimate Partner Violence: Not At Risk (09/02/2022)   Humiliation, Afraid, Rape, and Kick questionnaire    Fear of Current or Ex-Partner: No    Emotionally Abused: No    Physically Abused: No    Sexually Abused: No     Code Status   Code Status: Full Code  Review of Systems: All systems reviewed and negative except where noted in HPI.  Physical Exam: Vital signs in last 24 hours: Temp:  [97.9 F (36.6 C)-99.1 F (37.3 C)] 98.7 F (37.1 C) (09/06 1304) Pulse Rate:  [82-89] 87 (09/06 1304) Resp:  [17-25] 20 (09/06 1304) BP: (116-142)/(51-77) 121/63 (09/06 1304) SpO2:  [88 %-100 %] 93 % (09/06 1304) Weight:  [80.7 kg] 80.7 kg (09/06  8) Last BM Date :  (PTA)  General:  Pleasant female with periods of confusion  Psych:  Cooperative. Normal mood and affect Eyes: Pupils equal Ears:  Normal auditory acuity Nose: No deformity, discharge or lesions Neck:  Supple, no masses felt Lungs:  Clear to auscultation.  Heart:  Regular rate, regular rhythm.  Abdomen:   Soft, nondistended, nontender, active bowel sounds, no masses felt Rectal :  Deferred Msk: Symmetrical without gross deformities.  Neurologic:  Alert, oriented, grossly normal neurologically Extremities : No edema Skin:  Intact without significant lesions.    Intake/Output from previous day: 09/05 0701 - 09/06 0700 In: 0  Out: 800 [Urine:800] Intake/Output this shift:  No intake/output data recorded.   Vina Dasen, NP-C   09/20/2023, 3:09 PM

## 2023-09-21 DIAGNOSIS — C7A8 Other malignant neuroendocrine tumors: Secondary | ICD-10-CM | POA: Diagnosis not present

## 2023-09-21 DIAGNOSIS — D62 Acute posthemorrhagic anemia: Secondary | ICD-10-CM | POA: Diagnosis not present

## 2023-09-21 LAB — RENAL FUNCTION PANEL
Albumin: 2.7 g/dL — ABNORMAL LOW (ref 3.5–5.0)
Anion gap: 11 (ref 5–15)
BUN: 5 mg/dL — ABNORMAL LOW (ref 8–23)
CO2: 24 mmol/L (ref 22–32)
Calcium: 8.9 mg/dL (ref 8.9–10.3)
Chloride: 104 mmol/L (ref 98–111)
Creatinine, Ser: 0.59 mg/dL (ref 0.44–1.00)
GFR, Estimated: >60 mL/min (ref >60)
Glucose, Bld: 132 mg/dL — ABNORMAL HIGH (ref 70–99)
Phosphorus: 2.7 mg/dL (ref 2.5–4.6)
Potassium: 3.5 mmol/L (ref 3.5–5.1)
Sodium: 139 mmol/L (ref 135–145)

## 2023-09-21 LAB — CBC
HCT: 30.2 % — ABNORMAL LOW (ref 36.0–46.0)
Hemoglobin: 8.5 g/dL — ABNORMAL LOW (ref 12.0–15.0)
MCH: 20.3 pg — ABNORMAL LOW (ref 26.0–34.0)
MCHC: 28.1 g/dL — ABNORMAL LOW (ref 30.0–36.0)
MCV: 72.2 fL — ABNORMAL LOW (ref 80.0–100.0)
Platelets: 452 K/uL — ABNORMAL HIGH (ref 150–400)
RBC: 4.18 MIL/uL (ref 3.87–5.11)
RDW: 21.4 % — ABNORMAL HIGH (ref 11.5–15.5)
WBC: 7.5 K/uL (ref 4.0–10.5)
nRBC: 0.3 % — ABNORMAL HIGH (ref 0.0–0.2)

## 2023-09-21 LAB — URINE CULTURE: Culture: 20000 — AB

## 2023-09-21 LAB — GLUCOSE, CAPILLARY
Glucose-Capillary: 109 mg/dL — ABNORMAL HIGH (ref 70–99)
Glucose-Capillary: 121 mg/dL — ABNORMAL HIGH (ref 70–99)
Glucose-Capillary: 129 mg/dL — ABNORMAL HIGH (ref 70–99)
Glucose-Capillary: 120 mg/dL — ABNORMAL HIGH (ref 70–99)

## 2023-09-21 LAB — MAGNESIUM: Magnesium: 1.6 mg/dL — ABNORMAL LOW (ref 1.7–2.4)

## 2023-09-21 MED ORDER — POTASSIUM CHLORIDE CRYS ER 20 MEQ PO TBCR
40.0000 meq | EXTENDED_RELEASE_TABLET | Freq: Once | ORAL | Status: AC
  Administered 2023-09-21: 40 meq via ORAL
  Filled 2023-09-21: qty 2

## 2023-09-21 MED ORDER — SODIUM CHLORIDE (PF) 0.9 % IJ SOLN
INTRAMUSCULAR | Status: AC
  Administered 2023-09-21: 10 mL
  Filled 2023-09-21: qty 10

## 2023-09-21 MED ORDER — MAGNESIUM SULFATE 2 GM/50ML IV SOLN
2.0000 g | Freq: Once | INTRAVENOUS | Status: AC
  Administered 2023-09-21: 2 g via INTRAVENOUS
  Filled 2023-09-21: qty 50

## 2023-09-21 NOTE — Progress Notes (Signed)
 Inpatient Progress Note   Dr. Suzann rounding for Dr. Rollin weekend coverage   Patient Profile/Chief Complaint  73 year old female history of T2DM, CVA admitted with acute metabolic encephalopathy, acute hypoxic respiratory failure secondary to multifocal pneumonia and recurrent acute on chronic anemia / FOBT positive.   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  Neither were actively bleeding.  Plan was for further workup per her primary GI (Dr. Rollin).  There was concern that the recurring anemia could be from small bowel AVMs or other source   Admission Hgb was 6.6 in ED (down from 7.5 upon discharge from [facility]).  Hemoglobin up to 7.5 after unit of blood.  Denied overt bleeding.   Interval History   -- Hemoglobin stable today at 8.5 -- No overt bleeding    Objective   Vital signs in last 24 hours: Temp:  [97.4 F (36.3 C)-98.3 F (36.8 C)] 98.3 F (36.8 C) (09/07 0835) Pulse Rate:  [81-88] 88 (09/07 0835) Resp:  [16-18] 18 (09/07 0835) BP: (131-144)/(59-65) 131/59 (09/07 0835) SpO2:  [88 %-98 %] 98 % (09/07 1327) Last BM Date : 09/20/23 General:    Elderly woman sitting in bed no distress, wearing oxygen Heart:  Regular rate and rhythm; no murmurs Lungs: Coarse breath sounds bilaterally Abdomen:  Soft, nontender and nondistended. Normal bowel sounds. Extremities:  Without edema. Neurologic:  Alert and oriented,  grossly normal neurologically. Psych:  Cooperative. Normal mood and affect.  Intake/Output from previous day: 09/06 0701 - 09/07 0700 In: 1070 [P.O.:720; IV Piggyback:350] Out: 700 [Urine:700] Intake/Output this shift: Total I/O In: 400 [P.O.:400] Out: -   Lab Results: Recent Labs    09/19/23 1836 09/19/23 1845 09/19/23 1914 09/20/23 0435 09/21/23 0813  WBC 8.4  --   --  7.3 7.5   HGB 6.6*   < > 7.5* 7.5* 8.5*  HCT 24.9*   < > 22.0* 26.7* 30.2*  PLT 460*  --   --  328 452*   < > = values in this interval not displayed.   BMET Recent Labs    09/19/23 1828 09/19/23 1845 09/19/23 1914 09/20/23 0435 09/21/23 0813  NA 141 141 142 143 139  K 3.7 4.1 3.7 3.8 3.5  CL 107 109  --  106 104  CO2 23  --   --  22 24  GLUCOSE 114* 101*  --  137* 132*  BUN 10 10  --  10 5*  CREATININE 0.87 0.80  --  0.80 0.59  CALCIUM  8.7*  --   --  8.5* 8.9   LFT Recent Labs    09/19/23 1828 09/21/23 0813  PROT 6.6  --   ALBUMIN 2.8* 2.7*  AST 20  --   ALT 28  --   ALKPHOS 138*  --   BILITOT 0.7  --    PT/INR No results for input(s): LABPROT, INR in the last 72 hours.  Studies/Results: CT Angio Chest PE W and/or Wo Contrast Result Date: 09/19/2023 EXAM: CTA CHEST PE WITH/WITHOUT AND WITH CONTRAST CT ABDOMEN AND PELVIS WITH/WITHOUT AND WITH CONTRAST 09/19/2023 07:32:11 PM TECHNIQUE: CTA of the chest was performed after the administration of intravenous contrast (iohexol  (OMNIPAQUE ) 350 MG/ML injection 75 mL). Multiplanar reformatted images are provided for review. MIP images are provided for review. CT of the abdomen and pelvis was performed with the  administration of intravenous contrast. Automated exposure control, iterative reconstruction, and/or weight based adjustment of the mA/kV was utilized to reduce the radiation dose to as low as reasonably achievable. COMPARISON: Comparison with same day chest radiograph; CT chest pelvis 13/20/25. CLINICAL HISTORY: Pulmonary embolism (PE) suspected, high prob. Pt AMS, unable to follow breathing instructions at this time. Abdominal pain. Shortness of breath. FINDINGS: CHEST: PULMONARY ARTERIES: Pulmonary arteries are adequately opacified for evaluation. No intraluminal filling defect to suggest pulmonary embolism. MEDIASTINUM: New mediastinal and hilar lymphadenopathy. For example, a AP window node on series 6 image 122 measuring 1.3 cm  and a 1.3 cm right hilar node on series 6 image 159 measuring 1.3 cm. The heart and pericardium demonstrate no acute abnormality. There is no acute abnormality of the thoracic aorta. Coronary artery atherosclerotic calcifications are present. LUNGS AND PLEURA: Diffuse bilateral ground-glass opacities with interlobular septal thickening and small bilateral pleural effusions most suggestive of congestive heart failure. Atelectasis in the lower lobes. No pneumothorax. SOFT TISSUES AND BONES: No acute bone or soft tissue abnormality. ABDOMEN AND PELVIS: LIVER: Hepatic steatosis. GALLBLADDER AND BILE DUCTS: Cholelithiasis. No evidence of acute cholecystitis. No biliary ductal dilatation. SPLEEN: Spleen demonstrates no acute abnormality. PANCREAS: Pancreas demonstrates no acute abnormality. 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 No follow-up recommended. Adrenal glands are otherwise unremarkable. KIDNEYS, URETERS AND BLADDER: No stones in the kidneys or ureters. No hydronephrosis. No perinephric or periureteral stranding. Urinary bladder is unremarkable. GI AND BOWEL: Stomach and duodenal sweep demonstrate no acute abnormality. There is no bowel obstruction. No abnormal bowel wall thickening or distension. REPRODUCTIVE: Reproductive organs are unremarkable. PERITONEUM AND RETROPERITONEUM: No ascites or free air. LYMPH NODES: No lymphadenopathy in the abdomen or pelvis . BONES AND SOFT TISSUES: No acute abnormality of the visualized bones. No focal soft tissue abnormality. IMPRESSION: 1. No pulmonary embolism. 2. Diffuse bilateral ground-glass opacities with interlobular septal thickening and small bilateral pleural effusions, most suggestive of congestive heart failure. Atypical pneumonia could appear similarly. 3. New mediastinal and hilar lymphadenopathy, likely reactive. Continue observation on follow-up . 4. Hepatic steatosis. 5. Cholelithiasis without evidence of acute cholecystitis.  Electronically signed by: Norman Gatlin MD 09/19/2023 07:57 PM EDT RP Workstation: [id number]   CT ABDOMEN PELVIS W CONTRAST Result Date: 09/19/2023 EXAM: CTA CHEST PE WITH/WITHOUT AND WITH CONTRAST CT ABDOMEN AND PELVIS WITH/WITHOUT AND WITH CONTRAST 09/19/2023 07:32:11 PM TECHNIQUE: CTA of the chest was performed after the administration of intravenous contrast (iohexol  (OMNIPAQUE ) 350 MG/ML injection 75 mL). Multiplanar reformatted images are provided for review. MIP images are provided for review. CT of the abdomen and pelvis was performed with the administration of intravenous contrast. Automated exposure control, iterative reconstruction, and/or weight based adjustment of the mA/kV was utilized to reduce the radiation dose to as low as reasonably achievable. COMPARISON: Comparison with same day chest radiograph; CT chest pelvis 13/20/25. CLINICAL HISTORY: Pulmonary embolism (PE) suspected, high prob. Pt AMS, unable to follow breathing instructions at this time. Abdominal pain. Shortness of breath. FINDINGS: CHEST: PULMONARY ARTERIES: Pulmonary arteries are adequately opacified for evaluation. No intraluminal filling defect to suggest pulmonary embolism. MEDIASTINUM: New mediastinal and hilar lymphadenopathy. For example, a AP window node on series 6 image 122 measuring 1.3 cm and a 1.3 cm right hilar node on series 6 image 159 measuring 1.3 cm. The heart and pericardium demonstrate no acute abnormality. There is no acute abnormality of the thoracic aorta. Coronary artery atherosclerotic calcifications are present. LUNGS AND PLEURA: Diffuse bilateral ground-glass opacities  with interlobular septal thickening and small bilateral pleural effusions most suggestive of congestive heart failure. Atelectasis in the lower lobes. No pneumothorax. SOFT TISSUES AND BONES: No acute bone or soft tissue abnormality. ABDOMEN AND PELVIS: LIVER: Hepatic steatosis. GALLBLADDER AND BILE DUCTS: Cholelithiasis. No evidence of  acute cholecystitis. No biliary ductal dilatation. SPLEEN: Spleen demonstrates no acute abnormality. PANCREAS: Pancreas demonstrates no acute abnormality. 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 No follow-up recommended. Adrenal glands are otherwise unremarkable. KIDNEYS, URETERS AND BLADDER: No stones in the kidneys or ureters. No hydronephrosis. No perinephric or periureteral stranding. Urinary bladder is unremarkable. GI AND BOWEL: Stomach and duodenal sweep demonstrate no acute abnormality. There is no bowel obstruction. No abnormal bowel wall thickening or distension. REPRODUCTIVE: Reproductive organs are unremarkable. PERITONEUM AND RETROPERITONEUM: No ascites or free air. LYMPH NODES: No lymphadenopathy in the abdomen or pelvis . BONES AND SOFT TISSUES: No acute abnormality of the visualized bones. No focal soft tissue abnormality. IMPRESSION: 1. No pulmonary embolism. 2. Diffuse bilateral ground-glass opacities with interlobular septal thickening and small bilateral pleural effusions, most suggestive of congestive heart failure. Atypical pneumonia could appear similarly. 3. New mediastinal and hilar lymphadenopathy, likely reactive. Continue observation on follow-up . 4. Hepatic steatosis. 5. Cholelithiasis without evidence of acute cholecystitis. Electronically signed by: Norman Gatlin MD 09/19/2023 07:57 PM EDT RP Workstation: [id number]   CT Head Wo Contrast Result Date: 09/19/2023 CLINICAL DATA:  Delirium EXAM: CT HEAD WITHOUT CONTRAST TECHNIQUE: Contiguous axial images were obtained from the base of the skull through the vertex without intravenous contrast. RADIATION DOSE REDUCTION: This exam was performed according to the departmental dose-optimization program which includes automated exposure control, adjustment of the mA and/or kV according to patient size and/or use of iterative reconstruction technique. COMPARISON:  MRI head Jun 04, 2022.  CT head 08/01/2023. FINDINGS:  Brain: Similar remote infarct in the right parietal lobe with a severe patchy and confluent white matter hypoattenuation, compatible with chronic microvascular ischemic change. Additional remote infarct in the left cerebellum. Chronic basal ganglia calcifications. No evidence of acute large vascular territory infarct, acute hemorrhage, mass lesion or midline shift. Vascular: Calcific atherosclerosis.  No hyperdense vessel. Skull: No acute fracture. Sinuses/Orbits: Clear sinuses.  No acute orbital findings. Other: No mastoid effusions. IMPRESSION: 1. No evidence of acute intracranial abnormality. 2. 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 Electronically Signed   By: Gilmore GORMAN Molt M.D.   On: 09/19/2023 19:44   DG Chest Portable 1 View Result Date: 09/19/2023 CLINICAL DATA:  Short of breath EXAM: PORTABLE CHEST 1 VIEW COMPARISON:  08/28/2023 FINDINGS: Single frontal view of the chest demonstrates a stable cardiac silhouette. There is increased pulmonary vascular congestion, with basilar predominant interstitial and airspace opacities compatible with edema. No large effusion or pneumothorax. No acute bony abnormalities. IMPRESSION: 1. Findings most consistent with congestive heart failure and mild pulmonary edema. Electronically Signed   By: Ozell Daring M.D.   On: 09/19/2023 19:40    Endoscopic Studies:    Clinical Impression   73 year old female history of T2DM, CVA admitted with acute metabolic encephalopathy, acute hypoxic respiratory failure secondary to multifocal pneumonia and recurrent acute on chronic anemia / FOBT positive.   Hospitalized twice at [facility] within the last 6 weeks During both admissions she had worsening anemia and was diagnosed with clean-based gastric ulcers and a small low-grade gastric neuroendocrine tumor.  Neither were actively bleeding.  Plan was for further workup per her primary GI (Dr. Rollin).  There  was concern that the recurring  anemia could be from small bowel AVMs or other source   Admission Hgb was 6.6 in ED (down from 7.5 upon discharge from [facility]).  Hemoglobin 7.5 --> 8.5.  No overt bleeding and clinically stable.  Received Venofer  300 mg IV x 1 on 9 6   Plan  Continue to monitor daily CBC Continue Protonix  40 mg IV twice daily Dr. Rollin will resume rounding Monday, 09/22/2023 to determine further course of care    LOS: 2 days   Inocente CHRISTELLA Hausen  09/21/2023, 2:54 PM  Inocente Hausen, MD [facility]

## 2023-09-21 NOTE — Progress Notes (Addendum)
 PROGRESS NOTE  Allison Hughes FMW:[id number] DOB: February 14, 1950   PCP: Valma Carwin, MD  Patient is from: Home.  DOA: 09/19/2023 LOS: 2  Chief complaints Chief Complaint  Patient presents with   Altered Mental Status   Shortness of Breath     Brief Narrative / Interim history: 73 year old F with PMH of severe dementia, COPD, DM-2, CVA, GIB, neuroendocrine ulcer, HTN and recent hospitalization at [facility] for UGIB, hypoxic RF, multifocal pneumonia with parapneumonic effusion, cystitis and delirium brought to ED by EMS after her friend found her lying on floor.  It seems therapy recommended SNF but patient chose to go home with home health.  Per discharge summary, she was evaluated by psychiatry and deemed to have capacity to make her own medical decision and discharged home with home health.  Reportedly had recurrent falls at home, about 5 in a week.  Per EMS, she was hypoxic to 80% on room air.  In ED, stable vitals with saturation in 90s on 3 L.  Hgb 6.6 (was 7.5 on discharge from [facility]).  Hemoccult positive.  UDS positive for benzo.  UA with nitrites.  CXR raise concern for vascular congestion.  CT head without acute finding.  CT angio chest negative for PE but bilateral GGO opacity concerning for atypical pneumonia as well as CHF.  CT abdomen and pelvis showed cholelithiasis without acute cholecystitis.  Patient was admitted for ABLA, acute respiratory failure due to pneumonia and acute metabolic encephalopathy.  One unit of blood ordered.  Started on IV antibiotics and Protonix .  GI consulted.  Hemoglobin improved and remained stable after 1 unit.  No report of overt bleeding.  Urine culture with Klebsiella pneumonia.  Encephalopathy improving.  Therapy recommended SNF.   Subjective: Seen and examined earlier this morning.  No major events overnight or this morning.  Awake and alert.  She is oriented to self and place but not time.  No recollection into why she is in the hospital for.  Does  not remember the medication she takes at home.   Objective: Vitals:   09/21/23 0600 09/21/23 0835 09/21/23 1326 09/21/23 1327  BP: (!) 144/65 (!) 131/59    Pulse: 81 88    Resp: 16 18    Temp: (!) 97.4 F (36.3 C) 98.3 F (36.8 C)    TempSrc: Oral     SpO2: (!) 89% (!) 88% (!) 89% 98%  Weight:      Height:        Examination:  GENERAL: No apparent distress.  Nontoxic. HEENT: MMM.  Vision and hearing grossly intact.  NECK: Supple.  No apparent JVD.  RESP:  No IWOB.  Fair aeration bilaterally. CVS:  RRR. Heart sounds normal.  ABD/GI/GU: BS+. Abd soft, NTND.  MSK/EXT:  Moves extremities. No apparent deformity. No edema.  SKIN: no apparent skin lesion or wound NEURO: AA.  Oriented to self and place. Follows commands.  No apparent focal neuro deficit. PSYCH: Calm. Normal affect.   Consultants:  Gastroenterology  Procedures: None  Microbiology summarized: COVID-19, influenza, RSV and  a 20 pathogen RVP nonreactive. Urine culture pending  Assessment and plan: Acute hypoxic respiratory failure secondary to multifocal pneumonia COPD-not in acute exacerbation -Hypoxic to 80% when EMS arrived at home. -CT angio chest with bilateral GGO opacity.  BNP only 78. -Continue ceftriaxone  and Zithromax  -Continue bronchodilator -Wean oxygen as able  Acute blood loss anemia in a patient with Neuroendocrine ulcer: Hemoccult positive. Recently hospitalized at [facility] and had EGD that demonstrated clean-based  gastric neuroendocrine ulcer.    It seems she was advised to hold antiplatelets until outpatient GI evaluation. Unclear if she resumed them or not.  Hgb improved and stable after 1 unit.  No overt bleeding here. Recent Labs    09/19/23 1836 09/19/23 1845 09/19/23 1914 09/20/23 0435 09/21/23 0813  HGB 6.6* 8.5* 7.5* 7.5* 8.5*  - Continue holding antiplatelets-doubt need for DAPT.  - Per GI, primary GI to see on 9/8 -Received IV Venofer  300 mg x 1 on 9/6.  Will try to give  additional dose before discharge. - Check CBC daily - Continue Protonix  - Advance to soft diet   Acute metabolic encephalopathy likely secondary to above but she could also have some cognitive impairment.  CT head without acute finding.  She is currently awake and alert but only oriented to self.  She thinks she is at discharge.  She had normal B12, folate and TSH at [facility].  She is on Xanax  and Seroquel  at home.  UDS is positive for benzo. - Continue Xanax  0.25 mg twice daily to prevent withdrawal.  She seems to take 1 mg twice daily at home - Continue holding Seroquel   Chronic HFpEF: TTE in 2024 with LVEF of 65 to 70% and G1 DD.  BNP 1878.  Does not seem to be on diuretics at home.  Appears euvolemic on exam - Monitor fluid and respiratory status clinically  NIDDM-2: A1c 6.9%.  On metformin  at home.  Blood glucose within acceptable range. Recent Labs  Lab 09/20/23 1303 09/20/23 1708 09/20/23 2016 09/21/23 0800 09/21/23 1118  GLUCAP 110* 115* 92 109* 121*  - Continue SSI-sensitive  Gastric neuroendocrine ulcer -Needs outpatient follow-up with oncology -Continue PPI   Recurrent fall: Found down on the floor by good friend.  Previously recommended SNF but she refused and went home.  At that time, she was deemed to have capacity by psych.  She has not demonstrated capacity to make medical decision so far.  Has no family member to help with this either. She has a good friend, Mr. Adin but he is not her POA. -Fall precaution -PT/OT-recommended SNF. -TOC consulted.  UTI - Continue IV ceftriaxone   Essential hypertension: Normotensive. -Continue holding home lisinopril    Hyperlipidemia -Resume home statin once able to take p.o.  Hypomagnesemia - Replenish and recheck  Class I obesity Body mass index is 32.56 kg/m.           DVT prophylaxis:  SCDs Start: 09/19/23 2320  Code Status: Full code Family Communication: Updated patient's friend, Mr. Adin over the  phone Level of care: Telemetry Medical Status is: Inpatient Remains inpatient appropriate because: GI bleed, pneumonia and respiratory failure   Final disposition: To be determined   55 minutes with more than 50% spent in reviewing records, counseling patient/family and coordinating care.   Sch Meds:  Scheduled Meds:  ALPRAZolam   0.25 mg Oral BID   insulin  aspart  0-5 Units Subcutaneous QHS   insulin  aspart  0-9 Units Subcutaneous TID WC   pantoprazole  (PROTONIX ) IV  40 mg Intravenous Q12H   Continuous Infusions:  azithromycin  500 mg (09/20/23 1845)   cefTRIAXone  (ROCEPHIN )  IV 2 g (09/20/23 1742)   magnesium  sulfate bolus IVPB 2 g (09/21/23 1331)   PRN Meds:.albuterol   Antimicrobials: Anti-infectives (From admission, onward)    Start     Dose/Rate Route Frequency Ordered Stop   09/20/23 1830  azithromycin  (ZITHROMAX ) 500 mg in sodium chloride  0.9 % 250 mL IVPB  500 mg 250 mL/hr over 60 Minutes Intravenous Every 24 hours 09/20/23 0035     09/20/23 1800  cefTRIAXone  (ROCEPHIN ) 2 g in sodium chloride  0.9 % 100 mL IVPB        2 g 200 mL/hr over 30 Minutes Intravenous Every 24 hours 09/20/23 0035     09/19/23 2015  cefTRIAXone  (ROCEPHIN ) 1 g in sodium chloride  0.9 % 100 mL IVPB        1 g 200 mL/hr over 30 Minutes Intravenous  Once 09/19/23 2011 09/19/23 2159   09/19/23 2015  azithromycin  (ZITHROMAX ) 500 mg in sodium chloride  0.9 % 250 mL IVPB        500 mg 250 mL/hr over 60 Minutes Intravenous  Once 09/19/23 2011 09/19/23 2315        I have personally reviewed the following labs and images: CBC: Recent Labs  Lab 09/19/23 1836 09/19/23 1845 09/19/23 1914 09/20/23 0435 09/21/23 0813  WBC 8.4  --   --  7.3 7.5  NEUTROABS 6.8  --   --   --   --   HGB 6.6* 8.5* 7.5* 7.5* 8.5*  HCT 24.9* 25.0* 22.0* 26.7* 30.2*  MCV 73.2*  --   --  72.8* 72.2*  PLT 460*  --   --  328 452*   BMP &GFR Recent Labs  Lab 09/19/23 1828 09/19/23 1845 09/19/23 1914  09/20/23 0435 09/21/23 0813  NA 141 141 142 143 139  K 3.7 4.1 3.7 3.8 3.5  CL 107 109  --  106 104  CO2 23  --   --  22 24  GLUCOSE 114* 101*  --  137* 132*  BUN 10 10  --  10 5*  CREATININE 0.87 0.80  --  0.80 0.59  CALCIUM  8.7*  --   --  8.5* 8.9  MG  --   --   --   --  1.6*  PHOS  --   --   --   --  2.7   Estimated Creatinine Clearance: 62.5 mL/min (by C-G formula based on SCr of 0.59 mg/dL). Liver & Pancreas: Recent Labs  Lab 09/19/23 1828 09/21/23 0813  AST 20  --   ALT 28  --   ALKPHOS 138*  --   BILITOT 0.7  --   PROT 6.6  --   ALBUMIN 2.8* 2.7*   Recent Labs  Lab 09/19/23 1836  LIPASE 22   Recent Labs  Lab 09/19/23 1902  AMMONIA <13   Diabetic: Recent Labs    09/20/23 0435  HGBA1C 6.9*   Recent Labs  Lab 09/20/23 1303 09/20/23 1708 09/20/23 2016 09/21/23 0800 09/21/23 1118  GLUCAP 110* 115* 92 109* 121*   Cardiac Enzymes: Recent Labs  Lab 09/19/23 1828  CKTOTAL 31*   No results for input(s): PROBNP in the last 8760 hours. Coagulation Profile: No results for input(s): INR, PROTIME in the last 168 hours. Thyroid Function Tests: No results for input(s): TSH, T4TOTAL, FREET4, T3FREE, THYROIDAB in the last 72 hours. Lipid Profile: No results for input(s): CHOL, HDL, LDLCALC, TRIG, CHOLHDL, LDLDIRECT in the last 72 hours. Anemia Panel: Recent Labs    09/19/23 1943  VITAMINB12 432  FOLATE 19.4  FERRITIN 13  TIBC 361  IRON  <10*  RETICCTPCT 1.5   Urine analysis:    Component Value Date/Time   COLORURINE YELLOW 09/19/2023 2004   APPEARANCEUR HAZY (A) 09/19/2023 2004   LABSPEC 1.027 09/19/2023 2004   PHURINE 5.0 09/19/2023 2004   GLUCOSEU  NEGATIVE 09/19/2023 2004   HGBUR NEGATIVE 09/19/2023 2004   BILIRUBINUR NEGATIVE 09/19/2023 2004   KETONESUR NEGATIVE 09/19/2023 2004   PROTEINUR NEGATIVE 09/19/2023 2004   NITRITE POSITIVE (A) 09/19/2023 2004   LEUKOCYTESUR SMALL (A) 09/19/2023 2004   Sepsis  Labs: Invalid input(s): PROCALCITONIN, LACTICIDVEN  Microbiology: Recent Results (from the past 240 hours)  Resp panel by RT-PCR (RSV, Flu A&B, Covid) Anterior Nasal Swab     Status: None   Collection Time: 09/19/23  6:35 PM   Specimen: Anterior Nasal Swab  Result Value Ref Range Status   SARS Coronavirus 2 by RT PCR NEGATIVE NEGATIVE Final   Influenza A by PCR NEGATIVE NEGATIVE Final   Influenza B by PCR NEGATIVE NEGATIVE Final    Comment: (NOTE) The Xpert Xpress SARS-CoV-2/FLU/RSV plus assay is intended as an aid in the diagnosis of influenza from Nasopharyngeal swab specimens and should not be used as a sole basis for treatment. Nasal washings and aspirates are unacceptable for Xpert Xpress SARS-CoV-2/FLU/RSV testing.  Fact Sheet for Patients: BloggerCourse.com  Fact Sheet for Healthcare Providers: SeriousBroker.it  This test is not yet approved or cleared by the [place]  FDA and has been authorized for detection and/or diagnosis of SARS-CoV-2 by FDA under an Emergency Use Authorization (EUA). This EUA will remain in effect (meaning this test can be used) for the duration of the COVID-19 declaration under Section 564(b)(1) of the Act, 21 U.S.C. section 360bbb-3(b)(1), unless the authorization is terminated or revoked.     Resp Syncytial Virus by PCR NEGATIVE NEGATIVE Final    Comment: (NOTE) Fact Sheet for Patients: BloggerCourse.com  Fact Sheet for Healthcare Providers: SeriousBroker.it  This test is not yet approved or cleared by the [place]  FDA and has been authorized for detection and/or diagnosis of SARS-CoV-2 by FDA under an Emergency Use Authorization (EUA). This EUA will remain in effect (meaning this test can be used) for the duration of the COVID-19 declaration under Section 564(b)(1) of the Act, 21 U.S.C. section 360bbb-3(b)(1), unless the  authorization is terminated or revoked.  Performed at [facility], [address], [place], [place] [address]   Urine Culture     Status: Abnormal   Collection Time: 09/19/23  9:47 PM   Specimen: Urine, Clean Catch  Result Value Ref Range Status   Specimen Description URINE, CLEAN CATCH  Final   Special Requests   Final    NONE Performed at [facility], [address], [place], [place] [address]    Culture 20,000 COLONIES/mL KLEBSIELLA PNEUMONIAE (A)  Final   Report Status 09/21/2023 FINAL  Final   Organism ID, Bacteria KLEBSIELLA PNEUMONIAE (A)  Final      Susceptibility   Klebsiella pneumoniae - MIC*    AMPICILLIN >=32 RESISTANT Resistant     CEFAZOLIN  (URINE) Value in next row Sensitive      2 SENSITIVEThis is a modified FDA-approved test that has been validated and its performance characteristics determined by the reporting laboratory.  This laboratory is certified under the Clinical Laboratory Improvement Amendments CLIA as qualified to perform high complexity clinical laboratory testing.    CEFEPIME Value in next row Sensitive      2 SENSITIVEThis is a modified FDA-approved test that has been validated and its performance characteristics determined by the reporting laboratory.  This laboratory is certified under the Clinical Laboratory Improvement Amendments CLIA as qualified to perform high complexity clinical laboratory testing.    ERTAPENEM Value in next row Sensitive  2 SENSITIVEThis is a modified FDA-approved test that has been validated and its performance characteristics determined by the reporting laboratory.  This laboratory is certified under the Clinical Laboratory Improvement Amendments CLIA as qualified to perform high complexity clinical laboratory testing.    CEFTRIAXONE  Value in next row Sensitive      2 SENSITIVEThis is a modified FDA-approved test that has been validated and its performance characteristics determined by the reporting laboratory.   This laboratory is certified under the Clinical Laboratory Improvement Amendments CLIA as qualified to perform high complexity clinical laboratory testing.    CIPROFLOXACIN Value in next row Sensitive      2 SENSITIVEThis is a modified FDA-approved test that has been validated and its performance characteristics determined by the reporting laboratory.  This laboratory is certified under the Clinical Laboratory Improvement Amendments CLIA as qualified to perform high complexity clinical laboratory testing.    GENTAMICIN Value in next row Sensitive      2 SENSITIVEThis is a modified FDA-approved test that has been validated and its performance characteristics determined by the reporting laboratory.  This laboratory is certified under the Clinical Laboratory Improvement Amendments CLIA as qualified to perform high complexity clinical laboratory testing.    NITROFURANTOIN Value in next row Resistant      2 SENSITIVEThis is a modified FDA-approved test that has been validated and its performance characteristics determined by the reporting laboratory.  This laboratory is certified under the Clinical Laboratory Improvement Amendments CLIA as qualified to perform high complexity clinical laboratory testing.    TRIMETH/SULFA Value in next row Sensitive      2 SENSITIVEThis is a modified FDA-approved test that has been validated and its performance characteristics determined by the reporting laboratory.  This laboratory is certified under the Clinical Laboratory Improvement Amendments CLIA as qualified to perform high complexity clinical laboratory testing.    AMPICILLIN/SULBACTAM Value in next row Sensitive      2 SENSITIVEThis is a modified FDA-approved test that has been validated and its performance characteristics determined by the reporting laboratory.  This laboratory is certified under the Clinical Laboratory Improvement Amendments CLIA as qualified to perform high complexity clinical laboratory testing.     PIP/TAZO Value in next row Sensitive ug/mL     <=4 SENSITIVEThis is a modified FDA-approved test that has been validated and its performance characteristics determined by the reporting laboratory.  This laboratory is certified under the Clinical Laboratory Improvement Amendments CLIA as qualified to perform high complexity clinical laboratory testing.    MEROPENEM Value in next row Sensitive      <=4 SENSITIVEThis is a modified FDA-approved test that has been validated and its performance characteristics determined by the reporting laboratory.  This laboratory is certified under the Clinical Laboratory Improvement Amendments CLIA as qualified to perform high complexity clinical laboratory testing.    * 20,000 COLONIES/mL KLEBSIELLA PNEUMONIAE  Respiratory (~20 pathogens) panel by PCR     Status: None   Collection Time: 09/20/23  2:08 AM   Specimen: Nasopharyngeal Swab; Respiratory  Result Value Ref Range Status   Adenovirus NOT DETECTED NOT DETECTED Final   Coronavirus 229E NOT DETECTED NOT DETECTED Final    Comment: (NOTE) The Coronavirus on the Respiratory Panel, DOES NOT test for the novel  Coronavirus (2019 nCoV)    Coronavirus HKU1 NOT DETECTED NOT DETECTED Final   Coronavirus NL63 NOT DETECTED NOT DETECTED Final   Coronavirus OC43 NOT DETECTED NOT DETECTED Final   Metapneumovirus NOT DETECTED NOT DETECTED Final  Rhinovirus / Enterovirus NOT DETECTED NOT DETECTED Final   Influenza A NOT DETECTED NOT DETECTED Final   Influenza B NOT DETECTED NOT DETECTED Final   Parainfluenza Virus 1 NOT DETECTED NOT DETECTED Final   Parainfluenza Virus 2 NOT DETECTED NOT DETECTED Final   Parainfluenza Virus 3 NOT DETECTED NOT DETECTED Final   Parainfluenza Virus 4 NOT DETECTED NOT DETECTED Final   Respiratory Syncytial Virus NOT DETECTED NOT DETECTED Final   Bordetella pertussis NOT DETECTED NOT DETECTED Final   Bordetella Parapertussis NOT DETECTED NOT DETECTED Final   Chlamydophila pneumoniae NOT  DETECTED NOT DETECTED Final   Mycoplasma pneumoniae NOT DETECTED NOT DETECTED Final    Comment: Performed at [facility], [address], [place], [place] [address]    Radiology Studies: No results found.     Lonnette Shrode T. Nnamdi Dacus [facility]  If 7PM-7AM, please contact night-coverage [email or web address] 09/21/2023, 1:39 PM

## 2023-09-21 NOTE — Progress Notes (Deleted)
 [facility]   [phone] [phone]   Clinic New consult Note   Patient Care Team: Valma Carwin, MD as PCP - General (Internal Medicine) 09/21/2023  CHIEF COMPLAINTS/PURPOSE OF CONSULTATION:  Acute blood loss anemia   HISTORY OF PRESENTING ILLNESS:  Allison Hughes 73 y.o. female is here because of acute blood loss anemia. She has medical history consistent with HTN, hyperlipidemia, COPD, and DM2. Hospitalized on 09/19/2023 due to acute blood loss anemia and altered mental status. She has fallen about 5 times within this week.  EMS was called by patient's friend, upon arrival of EMS patient's saturation was in the 80s she received some nebulization with improvement in respiratory function and subsequently brought into the emergency room for further management.  She had been admitted recently at [facility] on 08/16/2023 with upper GI bleed as well as acute hypoxic respiratory failure found to be due to multifocal pneumonia.   ***She was found to have abnormal CBC from *** ***She denies recent chest pain on exertion, shortness of breath on minimal exertion, pre-syncopal episodes, or palpitations. ***She had not noticed any recent bleeding such as epistaxis, hematuria or hematochezia ***The patient denies over the counter NSAID ingestion. She is not *** on antiplatelets agents. Her last colonoscopy was *** ***She had no prior history or diagnosis of cancer. Her age appropriate screening programs are up-to-date. ***She denies any pica and eats a variety of diet. ***She never donated blood or received blood transfusion ***The patient was prescribed oral iron  supplements and she takes ***   REVIEW OF SYSTEMS:   Constitutional: Denies fevers, chills or abnormal night sweats Eyes: Denies blurriness of vision, double vision or watery eyes Ears, nose, mouth, throat, and face: Denies mucositis or sore throat Respiratory: Denies cough, dyspnea or wheezes Cardiovascular:  Denies palpitation, chest discomfort or lower extremity swelling Gastrointestinal:  Denies nausea, heartburn or change in bowel habits Skin: Denies abnormal skin rashes Lymphatics: Denies new lymphadenopathy or easy bruising Neurological:Denies numbness, tingling or new weaknesses Behavioral/Psych: Mood is stable, no new changes   All other systems were reviewed with the patient and are negative.   MEDICAL HISTORY:  Past Medical History:  Diagnosis Date   Acute pyelonephritis 02/02/2016   Diabetes mellitus 01/15/2004   T2DM   Hyperlipidemia    Hypertension    Obesity (BMI 30-39.9)    Pneumonia 09/06/2020   Sepsis (HCC) 02/02/2016   Stroke (HCC)     SURGICAL HISTORY: Past Surgical History:  Procedure Laterality Date   CESAREAN SECTION     LOOP RECORDER INSERTION N/A 09/08/2020   Procedure: LOOP RECORDER INSERTION;  Surgeon: Inocencio Soyla Lunger, MD;  Location: [facility] INVASIVE CV [facility];  Service: Cardiovascular;  Laterality: N/A;   ORIF ANKLE FRACTURE  05/11/2011   Procedure: OPEN REDUCTION INTERNAL FIXATION (ORIF) ANKLE FRACTURE;  Surgeon: Jerona LULLA Sage, MD;  Location: [facility] OR;  Service: Orthopedics;  Laterality: Right;   TUBAL LIGATION      SOCIAL HISTORY: Social History   Socioeconomic History   Marital status: Divorced    Spouse name: Not on file   Number of children: Not on file   Years of education: Not on file   Highest education level: Not on file  Occupational History   Not on file  Tobacco Use   Smoking status: Every Day    Current packs/day: 1.00    Average packs/day: 1 pack/day for 40.0 years (40.0 ttl pk-yrs)    Types: Cigarettes   Smokeless tobacco: Former  Quit date: 05/11/2011  Substance and Sexual Activity   Alcohol use: No   Drug use: No   Sexual activity: Never  Other Topics Concern   Not on file  Social History Narrative   Not on file   Social Drivers of Health   Financial Resource Strain: Not on file  Food Insecurity: Patient Unable To Answer  (09/21/2023)   Hunger Vital Sign    Worried About Running Out of Food in the Last Year: Patient unable to answer    Ran Out of Food in the Last Year: Patient unable to answer  Transportation Needs: Patient Unable To Answer (09/21/2023)   PRAPARE - Transportation    Lack of Transportation (Medical): Patient unable to answer    Lack of Transportation (Non-Medical): Patient unable to answer  Physical Activity: Not on file  Stress: Not on file  Social Connections: Patient Unable To Answer (09/21/2023)   Social Connection and Isolation Panel    Frequency of Communication with Friends and Family: Patient unable to answer    Frequency of Social Gatherings with Friends and Family: Patient unable to answer    Attends Religious Services: Patient unable to answer    Active Member of Clubs or Organizations: Patient unable to answer    Attends Banker Meetings: Patient unable to answer    Marital Status: Patient unable to answer  Intimate Partner Violence: Patient Unable To Answer (09/21/2023)   Humiliation, Afraid, Rape, and Kick questionnaire    Fear of Current or Ex-Partner: Patient unable to answer    Emotionally Abused: Patient unable to answer    Physically Abused: Patient unable to answer    Sexually Abused: Patient unable to answer    FAMILY HISTORY: Family History  Problem Relation Age of Onset   Drug abuse Daughter    Diabetes Daughter     ALLERGIES:  is allergic to iodine and shrimp [shellfish allergy].  MEDICATIONS:  No current facility-administered medications for this visit.   No current outpatient medications on file.   Facility-Administered Medications Ordered in Other Visits  Medication Dose Route Frequency Provider Last Rate Last Admin   albuterol  (PROVENTIL ) (2.5 MG/3ML) 0.083% nebulizer solution 2.5 mg  2.5 mg Nebulization Q4H PRN Dorinda Drue DASEN, MD       ALPRAZolam  (XANAX ) tablet 0.25 mg  0.25 mg Oral BID Gonfa, Taye T, MD   0.25 mg at 09/21/23 1000    azithromycin  (ZITHROMAX ) 500 mg in sodium chloride  0.9 % 250 mL IVPB  500 mg Intravenous Q24H Dorinda Drue T, MD 250 mL/hr at 09/21/23 1825 500 mg at 09/21/23 1825   cefTRIAXone  (ROCEPHIN ) 2 g in sodium chloride  0.9 % 100 mL IVPB  2 g Intravenous Q24H Dorinda Drue T, MD 200 mL/hr at 09/21/23 1730 2 g at 09/21/23 1730   insulin  aspart (novoLOG ) injection 0-5 Units  0-5 Units Subcutaneous QHS Dorinda Drue T, MD       insulin  aspart (novoLOG ) injection 0-9 Units  0-9 Units Subcutaneous TID WC Dorinda Drue DASEN, MD   1 Units at 09/21/23 1725   pantoprazole  (PROTONIX ) injection 40 mg  40 mg Intravenous Q12H Dorinda Drue T, MD   40 mg at 09/21/23 1002    PHYSICAL EXAMINATION: ECOG PERFORMANCE STATUS: {CHL ONC ECOG PS:[phone]}  There were no vitals filed for this visit. There were no vitals filed for this visit.  GENERAL:alert, no distress and comfortable SKIN: skin color, texture, turgor are normal, no rashes or significant lesions EYES: normal, conjunctiva are pink  and non-injected, sclera clear OROPHARYNX:no exudate, no erythema and lips, buccal mucosa, and tongue normal  NECK: supple, thyroid normal size, non-tender, without nodularity LYMPH:  no palpable lymphadenopathy in the cervical, axillary or inguinal LUNGS: clear to auscultation and percussion with normal breathing effort HEART: regular rate & rhythm and no murmurs and no lower extremity edema ABDOMEN:abdomen soft, non-tender and normal bowel sounds Musculoskeletal:no cyanosis of digits and no clubbing  PSYCH: alert & oriented x 3 with fluent speech NEURO: no focal motor/sensory deficits  LABORATORY DATA:  I have reviewed the data as listed    Latest Ref Rng & Units 09/21/2023    8:13 AM 09/20/2023    4:35 AM 09/19/2023    7:14 PM  CBC  WBC 4.0 - 10.5 K/uL 7.5  7.3    Hemoglobin 12.0 - 15.0 g/dL 8.5  7.5  7.5   Hematocrit 36.0 - 46.0 % 30.2  26.7  22.0   Platelets 150 - 400 K/uL 452  328         Latest Ref Rng & Units 09/21/2023     8:13 AM 09/20/2023    4:35 AM 09/19/2023    7:14 PM  CMP  Glucose 70 - 99 mg/dL 867  862    BUN 8 - 23 mg/dL 5  10    Creatinine 9.55 - 1.00 mg/dL 9.40  9.19    Sodium 864 - 145 mmol/L 139  143  142   Potassium 3.5 - 5.1 mmol/L 3.5  3.8  3.7   Chloride 98 - 111 mmol/L 104  106    CO2 22 - 32 mmol/L 24  22    Calcium  8.9 - 10.3 mg/dL 8.9  8.5       RADIOGRAPHIC STUDIES: I have personally reviewed the radiological images as listed and agreed with the findings in the report. CT Angio Chest PE W and/or Wo Contrast Result Date: 09/19/2023 EXAM: CTA CHEST PE WITH/WITHOUT AND WITH CONTRAST CT ABDOMEN AND PELVIS WITH/WITHOUT AND WITH CONTRAST 09/19/2023 07:32:11 PM TECHNIQUE: CTA of the chest was performed after the administration of intravenous contrast (iohexol  (OMNIPAQUE ) 350 MG/ML injection 75 mL). Multiplanar reformatted images are provided for review. MIP images are provided for review. CT of the abdomen and pelvis was performed with the administration of intravenous contrast. Automated exposure control, iterative reconstruction, and/or weight based adjustment of the mA/kV was utilized to reduce the radiation dose to as low as reasonably achievable. COMPARISON: Comparison with same day chest radiograph; CT chest pelvis 13/20/25. CLINICAL HISTORY: Pulmonary embolism (PE) suspected, high prob. Pt AMS, unable to follow breathing instructions at this time. Abdominal pain. Shortness of breath. FINDINGS: CHEST: PULMONARY ARTERIES: Pulmonary arteries are adequately opacified for evaluation. No intraluminal filling defect to suggest pulmonary embolism. MEDIASTINUM: New mediastinal and hilar lymphadenopathy. For example, a AP window node on series 6 image 122 measuring 1.3 cm and a 1.3 cm right hilar node on series 6 image 159 measuring 1.3 cm. The heart and pericardium demonstrate no acute abnormality. There is no acute abnormality of the thoracic aorta. Coronary artery atherosclerotic calcifications are  present. LUNGS AND PLEURA: Diffuse bilateral ground-glass opacities with interlobular septal thickening and small bilateral pleural effusions most suggestive of congestive heart failure. Atelectasis in the lower lobes. No pneumothorax. SOFT TISSUES AND BONES: No acute bone or soft tissue abnormality. ABDOMEN AND PELVIS: LIVER: Hepatic steatosis. GALLBLADDER AND BILE DUCTS: Cholelithiasis. No evidence of acute cholecystitis. No biliary ductal dilatation. SPLEEN: Spleen demonstrates no acute abnormality. PANCREAS: Pancreas demonstrates  no acute abnormality. 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 No follow-up recommended. Adrenal glands are otherwise unremarkable. KIDNEYS, URETERS AND BLADDER: No stones in the kidneys or ureters. No hydronephrosis. No perinephric or periureteral stranding. Urinary bladder is unremarkable. GI AND BOWEL: Stomach and duodenal sweep demonstrate no acute abnormality. There is no bowel obstruction. No abnormal bowel wall thickening or distension. REPRODUCTIVE: Reproductive organs are unremarkable. PERITONEUM AND RETROPERITONEUM: No ascites or free air. LYMPH NODES: No lymphadenopathy in the abdomen or pelvis . BONES AND SOFT TISSUES: No acute abnormality of the visualized bones. No focal soft tissue abnormality. IMPRESSION: 1. No pulmonary embolism. 2. Diffuse bilateral ground-glass opacities with interlobular septal thickening and small bilateral pleural effusions, most suggestive of congestive heart failure. Atypical pneumonia could appear similarly. 3. New mediastinal and hilar lymphadenopathy, likely reactive. Continue observation on follow-up . 4. Hepatic steatosis. 5. Cholelithiasis without evidence of acute cholecystitis. Electronically signed by: Norman Gatlin MD 09/19/2023 07:57 PM EDT RP Workstation: [id number]   CT ABDOMEN PELVIS W CONTRAST Result Date: 09/19/2023 EXAM: CTA CHEST PE WITH/WITHOUT AND WITH CONTRAST CT ABDOMEN AND PELVIS WITH/WITHOUT AND  WITH CONTRAST 09/19/2023 07:32:11 PM TECHNIQUE: CTA of the chest was performed after the administration of intravenous contrast (iohexol  (OMNIPAQUE ) 350 MG/ML injection 75 mL). Multiplanar reformatted images are provided for review. MIP images are provided for review. CT of the abdomen and pelvis was performed with the administration of intravenous contrast. Automated exposure control, iterative reconstruction, and/or weight based adjustment of the mA/kV was utilized to reduce the radiation dose to as low as reasonably achievable. COMPARISON: Comparison with same day chest radiograph; CT chest pelvis 13/20/25. CLINICAL HISTORY: Pulmonary embolism (PE) suspected, high prob. Pt AMS, unable to follow breathing instructions at this time. Abdominal pain. Shortness of breath. FINDINGS: CHEST: PULMONARY ARTERIES: Pulmonary arteries are adequately opacified for evaluation. No intraluminal filling defect to suggest pulmonary embolism. MEDIASTINUM: New mediastinal and hilar lymphadenopathy. For example, a AP window node on series 6 image 122 measuring 1.3 cm and a 1.3 cm right hilar node on series 6 image 159 measuring 1.3 cm. The heart and pericardium demonstrate no acute abnormality. There is no acute abnormality of the thoracic aorta. Coronary artery atherosclerotic calcifications are present. LUNGS AND PLEURA: Diffuse bilateral ground-glass opacities with interlobular septal thickening and small bilateral pleural effusions most suggestive of congestive heart failure. Atelectasis in the lower lobes. No pneumothorax. SOFT TISSUES AND BONES: No acute bone or soft tissue abnormality. ABDOMEN AND PELVIS: LIVER: Hepatic steatosis. GALLBLADDER AND BILE DUCTS: Cholelithiasis. No evidence of acute cholecystitis. No biliary ductal dilatation. SPLEEN: Spleen demonstrates no acute abnormality. PANCREAS: Pancreas demonstrates no acute abnormality. 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 No follow-up recommended.  Adrenal glands are otherwise unremarkable. KIDNEYS, URETERS AND BLADDER: No stones in the kidneys or ureters. No hydronephrosis. No perinephric or periureteral stranding. Urinary bladder is unremarkable. GI AND BOWEL: Stomach and duodenal sweep demonstrate no acute abnormality. There is no bowel obstruction. No abnormal bowel wall thickening or distension. REPRODUCTIVE: Reproductive organs are unremarkable. PERITONEUM AND RETROPERITONEUM: No ascites or free air. LYMPH NODES: No lymphadenopathy in the abdomen or pelvis . BONES AND SOFT TISSUES: No acute abnormality of the visualized bones. No focal soft tissue abnormality. IMPRESSION: 1. No pulmonary embolism. 2. Diffuse bilateral ground-glass opacities with interlobular septal thickening and small bilateral pleural effusions, most suggestive of congestive heart failure. Atypical pneumonia could appear similarly. 3. New mediastinal and hilar lymphadenopathy, likely reactive. Continue observation on follow-up .  4. Hepatic steatosis. 5. Cholelithiasis without evidence of acute cholecystitis. Electronically signed by: Norman Gatlin MD 09/19/2023 07:57 PM EDT RP Workstation: [id number]   CT Head Wo Contrast Result Date: 09/19/2023 CLINICAL DATA:  Delirium EXAM: CT HEAD WITHOUT CONTRAST TECHNIQUE: Contiguous axial images were obtained from the base of the skull through the vertex without intravenous contrast. RADIATION DOSE REDUCTION: This exam was performed according to the departmental dose-optimization program which includes automated exposure control, adjustment of the mA and/or kV according to patient size and/or use of iterative reconstruction technique. COMPARISON:  MRI head Jun 04, 2022.  CT head 08/01/2023. FINDINGS: Brain: Similar remote infarct in the right parietal lobe with a severe patchy and confluent white matter hypoattenuation, compatible with chronic microvascular ischemic change. Additional remote infarct in the left cerebellum. Chronic basal  ganglia calcifications. No evidence of acute large vascular territory infarct, acute hemorrhage, mass lesion or midline shift. Vascular: Calcific atherosclerosis.  No hyperdense vessel. Skull: No acute fracture. Sinuses/Orbits: Clear sinuses.  No acute orbital findings. Other: No mastoid effusions. IMPRESSION: 1. No evidence of acute intracranial abnormality. 2. 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 Electronically Signed   By: Gilmore GORMAN Molt M.D.   On: 09/19/2023 19:44   DG Chest Portable 1 View Result Date: 09/19/2023 CLINICAL DATA:  Short of breath EXAM: PORTABLE CHEST 1 VIEW COMPARISON:  08/28/2023 FINDINGS: Single frontal view of the chest demonstrates a stable cardiac silhouette. There is increased pulmonary vascular congestion, with basilar predominant interstitial and airspace opacities compatible with edema. No large effusion or pneumothorax. No acute bony abnormalities. IMPRESSION: 1. Findings most consistent with congestive heart failure and mild pulmonary edema. Electronically Signed   By: Ozell Daring M.D.   On: 09/19/2023 19:40   CUP PACEART REMOTE DEVICE CHECK Result Date: 09/18/2023 ILR summary report received. Battery status OK. Normal device function. No new symptom, tachy, brady, or pause episodes. No new AF episodes. Monthly summary reports and ROV/PRN - CS, CVRS   No orders of the defined types were placed in this encounter.   All questions were answered. The patient knows to call the clinic with any problems, questions or concerns. The total time spent in the appointment was {CHL ONC TIME VISIT - DTPQU:[id number]}.     Powell FORBES Lessen, NP 09/21/2023 8:33 PM  I, Izetta Neither, am acting as scribe for Onita Mattock, MD.   {Add scribe attestation statement}

## 2023-09-21 NOTE — Plan of Care (Signed)
  Problem: Education: Goal: Ability to describe self-care measures that may prevent or decrease complications (Diabetes Survival Skills Education) will improve Outcome: Progressing Goal: Individualized Educational Video(s) Outcome: Progressing   Problem: Coping: Goal: Ability to adjust to condition or change in health will improve Outcome: Progressing   Problem: Fluid Volume: Goal: Ability to maintain a balanced intake and output will improve Outcome: Progressing   Problem: Health Behavior/Discharge Planning: Goal: Ability to identify and utilize available resources and services will improve Outcome: Progressing Goal: Ability to manage health-related needs will improve Outcome: Progressing   Problem: Metabolic: Goal: Ability to maintain appropriate glucose levels will improve Outcome: Progressing   She can express she had falls at home and is not safe to be at home

## 2023-09-21 NOTE — Evaluation (Signed)
 Physical Therapy Evaluation Patient Details Name: Allison Hughes MRN: [id number] DOB: 02/20/1950 Today's Date: 09/21/2023  History of Present Illness  Pt is a 73 y.o female admitted 9/5 found on the floor hypoxic and confused. CT showed concerns for pneumonia, no acute changes in brain. 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 PMHx: DMII, HTN and DLD, CVA R parietal, aphasia, R corona radiata, and L cerebellar regions, COPD   Clinical Impression  Pt admitted with above diagnosis. PTA pt lived at home alone, amb with cane vs RW with multiple recent falls. Pt recently discharged from [facility] at which time SNF was recommended but pt declined. Pt currently with functional limitations due to the deficits listed below (see PT Problem List). On eval, she required mod assist bed mobility, min assist sit to stand, and mod assist amb 5' with RW. Bilat knee buckling noted. Fair sitting balance and poor standing balance. Generalized weakness. Pt will benefit from acute skilled PT to increase their independence and safety with mobility to allow discharge. Post acute, pt would benefit from further therapy in inpatient setting < 3 hours/day.         If plan is discharge home, recommend the following: A lot of help with walking and/or transfers;A lot of help with bathing/dressing/bathroom;Assistance with cooking/housework;Supervision due to cognitive status;Assist for transportation;Help with stairs or ramp for entrance   Can travel by private vehicle   Yes    Equipment Recommendations None recommended by PT  Recommendations for Other Services       Functional Status Assessment Patient has had a recent decline in their functional status and demonstrates the ability to make significant improvements in function in a reasonable and predictable amount of time.     Precautions / Restrictions Precautions Precautions: Fall Recall of Precautions/Restrictions:  Impaired Precaution/Restrictions Comments: multiple falls      Mobility  Bed Mobility Overal bed mobility: Needs Assistance Bed Mobility: Supine to Sit     Supine to sit: Min assist, HOB elevated, Used rails     General bed mobility comments: increased time, cues for sequencing    Transfers Overall transfer level: Needs assistance Equipment used: Rolling walker (2 wheels) Transfers: Sit to/from Stand Sit to Stand: Min assist           General transfer comment: assist to power up, cues for sequencing    Ambulation/Gait Ambulation/Gait assistance: Min assist Gait Distance (Feet): 5 Feet Assistive device: Rolling walker (2 wheels) Gait Pattern/deviations: Step-through pattern, Decreased stride length, Knees buckling Gait velocity: decreased     General Gait Details: distance limited by weakness, knee buckling  Stairs            Wheelchair Mobility     Tilt Bed    Modified Rankin (Stroke Patients Only)       Balance Overall balance assessment: Needs assistance Sitting-balance support: No upper extremity supported, Feet supported Sitting balance-Leahy Scale: Fair     Standing balance support: Bilateral upper extremity supported, During functional activity, Reliant on assistive device for balance Standing balance-Leahy Scale: Poor                               Pertinent Vitals/Pain Pain Assessment Pain Assessment: No/denies pain    Home Living Family/patient expects to be discharged to:: Private residence Living Arrangements: Alone Available Help at Discharge: Friend(s);Available PRN/intermittently Type of Home: House Home Access: Stairs to enter Entrance Stairs-Rails: Right;Left;Can reach both Entrance  Stairs-Number of Steps: 3   Home Layout: One level Home Equipment: Agricultural consultant (2 wheels);Cane - single point;Shower seat;BSC/3in1      Prior Function Prior Level of Function : Needs assist             Mobility  Comments: amb with cane at baseline ADLs Comments: boyfriend assists PRN with showering, meal prep, and home management     Extremity/Trunk Assessment   Upper Extremity Assessment Upper Extremity Assessment: Generalized weakness    Lower Extremity Assessment Lower Extremity Assessment: Generalized weakness    Cervical / Trunk Assessment Cervical / Trunk Assessment: Normal  Communication   Communication Communication: Impaired Factors Affecting Communication: Hearing impaired    Cognition Arousal: Alert Behavior During Therapy: WFL for tasks assessed/performed   PT - Cognitive impairments: No family/caregiver present to determine baseline, Problem solving, Safety/Judgement                       PT - Cognition Comments: Pt relaying that her daughter passed away in Jan 27, 2024 and she hasn't been herself since. Oriented but confused. Posey belt in place on arrival, pt stating my roommates thought it would be funny to tie me to the bed. Following commands: Impaired Following commands impaired: Only follows one step commands consistently     Cueing Cueing Techniques: Verbal cues, Gestural cues     General Comments General comments (skin integrity, edema, etc.): SpO2 92% on 2L    Exercises     Assessment/Plan    PT Assessment Patient needs continued PT services  PT Problem List Decreased strength;Decreased cognition;Decreased activity tolerance;Decreased balance;Decreased mobility;Decreased knowledge of precautions       PT Treatment Interventions DME instruction;Therapeutic exercise;Gait training;Balance training;Stair training;Functional mobility training;Cognitive remediation;Therapeutic activities;Patient/family education    PT Goals (Current goals can be found in the Care Plan section)  Acute Rehab PT Goals Patient Stated Goal: get stronger PT Goal Formulation: With patient Time For Goal Achievement: 10/05/23 Potential to Achieve Goals: Good    Frequency  Min 2X/week     Co-evaluation               AM-PAC PT 6 Clicks Mobility  Outcome Measure Help needed turning from your back to your side while in a flat bed without using bedrails?: A Little Help needed moving from lying on your back to sitting on the side of a flat bed without using bedrails?: A Lot Help needed moving to and from a bed to a chair (including a wheelchair)?: A Lot Help needed standing up from a chair using your arms (e.g., wheelchair or bedside chair)?: A Little Help needed to walk in hospital room?: A Lot Help needed climbing 3-5 steps with a railing? : Total 6 Click Score: 13    End of Session Equipment Utilized During Treatment: Gait belt;Oxygen Activity Tolerance: Patient tolerated treatment well Patient left: in chair;with chair alarm set;with call bell/phone within reach Nurse Communication: Mobility status PT Visit Diagnosis: Repeated falls (R29.6);Muscle weakness (generalized) (M62.81)    Time: 9041-8982 PT Time Calculation (min) (ACUTE ONLY): 19 min   Charges:   PT Evaluation $PT Eval Moderate Complexity: 1 Mod   PT General Charges $$ ACUTE PT VISIT: 1 Visit         Sari MATSU., PT  Office # [phone]   Erven Sari Shaker 09/21/2023, 12:48 PM

## 2023-09-21 NOTE — Evaluation (Signed)
 Occupational Therapy Evaluation Patient Details Name: Allison Hughes MRN: [id number] DOB: [birth year] Today's Date: 09/21/2023   History of Present Illness   Pt is a 73 y.o female admitted 9/5 found on the floor hypoxic and confused. CT showed concerns for pneumonia, no acute changes in brain. 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 PMHx: DMII, HTN and DLD, CVA R parietal, aphasia, R corona radiata, and L cerebellar regions, COPD     Clinical Impressions Pt admitted based on above, and was seen based on problem list below. Pt reporting independence with basic ADLs PTA, with assist PRN for IADLs. Today pt is requiring set up  to mod assist for ADLs. Bed mobility and functional transfers are  mod assist with RW. Pt with poor stm, ltm, problem solving, and judgement creating high fall risk. Pt with poor ability to follow commands, attempting to sit during functional transfers before in front of chair, assist to correct. Given pt's current functional status pt would require <3 hours of skilled rehab daily. OT will continue to follow acutely to maximize functional independence.        If plan is discharge home, recommend the following:   A lot of help with walking and/or transfers;A lot of help with bathing/dressing/bathroom;Supervision due to cognitive status     Functional Status Assessment   Patient has had a recent decline in their functional status and demonstrates the ability to make significant improvements in function in a reasonable and predictable amount of time.     Equipment Recommendations   Other (comment) (Defer to next venue)      Precautions/Restrictions   Precautions Precautions: Fall Recall of Precautions/Restrictions: Impaired Precaution/Restrictions Comments: multiple falls Restrictions Weight Bearing Restrictions Per Provider Order: No     Mobility Bed Mobility Overal bed mobility: Needs Assistance Bed Mobility: Sit  to Supine       Sit to supine: Min assist   General bed mobility comments: Assist for BLEs into bed    Transfers Overall transfer level: Needs assistance Equipment used: Rolling walker (2 wheels) Transfers: Sit to/from Stand, Bed to chair/wheelchair/BSC Sit to Stand: Mod assist     Step pivot transfers: Min assist     General transfer comment: Mod assist to stand from recliner, once in standing min assist with heavy cues to sequence transfer, pt with tendency to attempt to sit before at chair      Balance Overall balance assessment: Needs assistance Sitting-balance support: No upper extremity supported, Feet supported Sitting balance-Leahy Scale: Fair     Standing balance support: Bilateral upper extremity supported, During functional activity, Reliant on assistive device for balance Standing balance-Leahy Scale: Poor Standing balance comment: Reliant on RW     ADL either performed or assessed with clinical judgement   ADL Overall ADL's : Needs assistance/impaired Eating/Feeding: Set up;Sitting   Grooming: Set up;Sitting   Upper Body Bathing: Set up;Sitting   Lower Body Bathing: Moderate assistance;Sit to/from stand   Upper Body Dressing : Set up;Sitting   Lower Body Dressing: Moderate assistance;Sit to/from stand Lower Body Dressing Details (indicate cue type and reason): Assist to pull above waist in standing. Pt able to figure 4 legs Toilet Transfer: Moderate assistance;BSC/3in1;Stand-pivot;Rolling walker (2 wheels) Toilet Transfer Details (indicate cue type and reason): Short step pivot BSC, heavy cues for sequencing, pt attempting to sit before fully in front of chair Toileting- Clothing Manipulation and Hygiene: Total assistance;Sit to/from stand Toileting - Clothing Manipulation Details (indicate cue type and reason): Pt requires BUE support  at all times     Functional mobility during ADLs: Moderate assistance;Rolling walker (2 wheels) General ADL  Comments: Heavy cues for sequencing tasks, poor stm and cog limiting. Also poor balance     Vision Baseline Vision/History: 0 No visual deficits Vision Assessment?: No apparent visual deficits            Pertinent Vitals/Pain Pain Assessment Pain Assessment: No/denies pain     Extremity/Trunk Assessment Upper Extremity Assessment Upper Extremity Assessment: Generalized weakness   Lower Extremity Assessment Lower Extremity Assessment: Defer to PT evaluation   Cervical / Trunk Assessment Cervical / Trunk Assessment: Normal   Communication Communication Communication: Impaired Factors Affecting Communication: Hearing impaired   Cognition Arousal: Alert Behavior During Therapy: WFL for tasks assessed/performed Cognition: Cognition impaired   Orientation impairments: Situation Awareness: Online awareness impaired Memory impairment (select all impairments): Short-term memory, Working memory Attention impairment (select first level of impairment): Sustained attention, Selective attention Executive functioning impairment (select all impairments): Initiation, Sequencing, Reasoning, Problem solving, Organization OT - Cognition Comments: Pt potentially hallucinating, pointing out a window and saying a name. Pt tangiental, poor safety awareness, often attempting to sit before cued     Following commands: Impaired Following commands impaired: Follows one step commands inconsistently     Cueing  General Comments   Cueing Techniques: Verbal cues;Gestural cues;Tactile cues  VSS on 2L           Home Living Family/patient expects to be discharged to:: Private residence Living Arrangements: Alone Available Help at Discharge: Friend(s);Available PRN/intermittently Type of Home: House Home Access: Stairs to enter [employer] of Steps: 3 Entrance Stairs-Rails: Right;Left;Can reach both Home Layout: One level     Bathroom Shower/Tub: Contractor: Standard Bathroom Accessibility: Yes How Accessible: Accessible via walker Home Equipment: Rolling Walker (2 wheels);Cane - single point;Shower seat;BSC/3in1          Prior Functioning/Environment Prior Level of Function : Independent/Modified Independent       Mobility Comments: Use of SPC ADLs Comments: mod I with ADLs, boyfriend supervises showers, and assists with IADLs PRN    OT Problem List: Decreased strength;Decreased range of motion;Decreased activity tolerance;Impaired balance (sitting and/or standing);Decreased safety awareness;Decreased cognition;Decreased knowledge of use of DME or AE   OT Treatment/Interventions: Self-care/ADL training;Therapeutic exercise;Energy conservation;DME and/or AE instruction;Therapeutic activities;Patient/family education;Balance training      OT Goals(Current goals can be found in the care plan section)   Acute Rehab OT Goals Patient Stated Goal: To rest OT Goal Formulation: With patient Time For Goal Achievement: 10/05/23 Potential to Achieve Goals: Good   OT Frequency:  Min 2X/week       AM-PAC OT 6 Clicks Daily Activity     Outcome Measure Help from another person eating meals?: None Help from another person taking care of personal grooming?: A Little Help from another person toileting, which includes using toliet, bedpan, or urinal?: Total Help from another person bathing (including washing, rinsing, drying)?: A Lot Help from another person to put on and taking off regular upper body clothing?: A Little Help from another person to put on and taking off regular lower body clothing?: A Lot 6 Click Score: 15   End of Session Equipment Utilized During Treatment: Rolling walker (2 wheels);Gait belt Nurse Communication: Mobility status  Activity Tolerance: Patient tolerated treatment well Patient left: in bed;with call bell/phone within reach;with bed alarm set  OT Visit Diagnosis: Unsteadiness on feet (R26.81);Other  abnormalities of gait and mobility (R26.89);Muscle weakness (generalized) (  M62.81)                Time: 8545-8476 OT Time Calculation (min): 29 min Charges:  OT General Charges $OT Visit: 1 Visit OT Evaluation $OT Eval Moderate Complexity: 1 Mod OT Treatments $Self Care/Home Management : 8-22 mins  Adrianne BROCKS, OT  Acute Rehabilitation Services Office [phone] Secure chat preferred   Adrianne GORMAN Savers 09/21/2023, 4:16 PM

## 2023-09-22 ENCOUNTER — Inpatient Hospital Stay

## 2023-09-22 ENCOUNTER — Inpatient Hospital Stay: Admitting: Nurse Practitioner

## 2023-09-22 DIAGNOSIS — D62 Acute posthemorrhagic anemia: Secondary | ICD-10-CM | POA: Diagnosis not present

## 2023-09-22 LAB — RENAL FUNCTION PANEL
Albumin: 2.8 g/dL — ABNORMAL LOW (ref 3.5–5.0)
Anion gap: 11 (ref 5–15)
BUN: <5 mg/dL — ABNORMAL LOW (ref 8–23)
CO2: 22 mmol/L (ref 22–32)
Calcium: 8.9 mg/dL (ref 8.9–10.3)
Chloride: 105 mmol/L (ref 98–111)
Creatinine, Ser: 0.54 mg/dL (ref 0.44–1.00)
GFR, Estimated: >60 mL/min (ref >60)
Glucose, Bld: 121 mg/dL — ABNORMAL HIGH (ref 70–99)
Phosphorus: 2.7 mg/dL (ref 2.5–4.6)
Potassium: 3.5 mmol/L (ref 3.5–5.1)
Sodium: 138 mmol/L (ref 135–145)

## 2023-09-22 LAB — CBC
HCT: 30.1 % — ABNORMAL LOW (ref 36.0–46.0)
Hemoglobin: 8.6 g/dL — ABNORMAL LOW (ref 12.0–15.0)
MCH: 20.7 pg — ABNORMAL LOW (ref 26.0–34.0)
MCHC: 28.6 g/dL — ABNORMAL LOW (ref 30.0–36.0)
MCV: 72.5 fL — ABNORMAL LOW (ref 80.0–100.0)
Platelets: 448 K/uL — ABNORMAL HIGH (ref 150–400)
RBC: 4.15 MIL/uL (ref 3.87–5.11)
RDW: 21.8 % — ABNORMAL HIGH (ref 11.5–15.5)
WBC: 8.1 K/uL (ref 4.0–10.5)
nRBC: 0 % (ref 0.0–0.2)

## 2023-09-22 LAB — GLUCOSE, CAPILLARY
Glucose-Capillary: 127 mg/dL — ABNORMAL HIGH (ref 70–99)
Glucose-Capillary: 244 mg/dL — ABNORMAL HIGH (ref 70–99)
Glucose-Capillary: 210 mg/dL — ABNORMAL HIGH (ref 70–99)

## 2023-09-22 LAB — MAGNESIUM: Magnesium: 1.8 mg/dL (ref 1.7–2.4)

## 2023-09-22 MED ORDER — AZITHROMYCIN 500 MG PO TABS
500.0000 mg | ORAL_TABLET | Freq: Every day | ORAL | Status: DC
Start: 2023-09-22 — End: 2023-09-25
  Administered 2023-09-22 – 2023-09-23 (×2): 500 mg via ORAL
  Filled 2023-09-22 (×2): qty 1

## 2023-09-22 MED ORDER — LISINOPRIL 20 MG PO TABS
20.0000 mg | ORAL_TABLET | Freq: Every day | ORAL | Status: DC
  Administered 2023-09-22 – 2023-09-23 (×2): 20 mg via ORAL
  Filled 2023-09-22 (×3): qty 1

## 2023-09-22 MED ORDER — PANTOPRAZOLE SODIUM 40 MG PO TBEC
40.0000 mg | DELAYED_RELEASE_TABLET | Freq: Two times a day (BID) | ORAL | Status: DC
  Administered 2023-09-22 – 2023-09-24 (×4): 40 mg via ORAL
  Filled 2023-09-22 (×4): qty 1

## 2023-09-22 MED ORDER — IPRATROPIUM-ALBUTEROL 0.5-2.5 (3) MG/3ML IN SOLN: 3.0000 mL | RESPIRATORY_TRACT | Status: DC | PRN

## 2023-09-22 MED ORDER — METFORMIN HCL ER 500 MG PO TB24
500.0000 mg | ORAL_TABLET | Freq: Every day | ORAL | Status: DC
  Administered 2023-09-22 – 2023-09-23 (×2): 500 mg via ORAL
  Filled 2023-09-22 (×3): qty 1

## 2023-09-22 MED ORDER — SODIUM CHLORIDE 0.9 % IV SOLN
2.0000 g | INTRAVENOUS | Status: DC
  Administered 2023-09-22 – 2023-09-23 (×2): 2 g via INTRAVENOUS
  Filled 2023-09-22 (×2): qty 20

## 2023-09-22 MED ORDER — BUDESON-GLYCOPYRROL-FORMOTEROL 160-9-4.8 MCG/ACT IN AERO
2.0000 | INHALATION_SPRAY | Freq: Two times a day (BID) | RESPIRATORY_TRACT | Status: DC
  Administered 2023-09-23 (×2): 2 via RESPIRATORY_TRACT
  Filled 2023-09-22: qty 5.9

## 2023-09-22 MED ORDER — LISINOPRIL 20 MG PO TABS: 20.0000 mg | ORAL_TABLET | Freq: Every day | ORAL | Status: DC

## 2023-09-22 MED ORDER — ATORVASTATIN CALCIUM 40 MG PO TABS
40.0000 mg | ORAL_TABLET | Freq: Every day | ORAL | Status: DC
  Administered 2023-09-22 – 2023-09-24 (×3): 40 mg via ORAL
  Filled 2023-09-22 (×3): qty 1

## 2023-09-22 NOTE — [place] FL2 (Signed)
 [place]  MEDICAID FL2 LEVEL OF CARE FORM     IDENTIFICATION  Patient Name: Allison Hughes Birthdate: [birth year] Sex: female Admission Date (Current Location): 09/19/2023  [facility] and [place] Number:  Producer, television/film/video and Address:  [facility], 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   Neurocognitive disorder 08/31/2023   Impaired mobility and ADLs 08/20/2023   Anemia 09/02/2022   Aphasia 06/03/2022   History of stroke 09/06/2020   DM (diabetes mellitus), type 2 (HCC) 11/20/2018   Hypokalemia 02/02/2016   Anxiety 02/02/2016   Essential hypertension 02/02/2016    Orientation RESPIRATION BLADDER Height & Weight     Self, Time, Situation, Place  Normal Incontinent Weight: 178 lb (80.7 kg) Height:  5' 2 (157.5 cm)  BEHAVIORAL SYMPTOMS/MOOD NEUROLOGICAL BOWEL NUTRITION STATUS      Continent Diet (see dc summary)  AMBULATORY STATUS COMMUNICATION OF NEEDS Skin   Limited Assist Verbally  (Redness/Erythema)                       Personal Care Assistance Level of Assistance  Bathing, Feeding, Dressing Bathing Assistance: Maximum assistance Feeding assistance: Independent Dressing Assistance: Maximum assistance     Functional Limitations Info  Sight, Hearing, Speech Sight Info: Impaired Hearing Info: Impaired Speech Info: Adequate    SPECIAL CARE FACTORS  FREQUENCY  PT (By licensed PT), OT (By licensed OT)     PT Frequency: 5x a week OT Frequency: 5x a week            Contractures Contractures Info: Not present    Additional Factors Info  Code Status, Allergies, Insulin  Sliding Scale Code Status Info: FULL Allergies Info: Iodine  Shrimp (Shellfish Allergy)   Insulin  Sliding Scale Info: Novolog        Current Medications (09/22/2023):  This is the current hospital active medication list Current Facility-Administered Medications  Medication Dose Route Frequency Provider Last Rate Last Admin   ALPRAZolam  (XANAX ) tablet 0.25 mg  0.25 mg Oral BID Gonfa, Taye T, MD   0.25 mg at 09/22/23 0900   atorvastatin  (LIPITOR) tablet 40 mg  40 mg Oral Daily Gonfa, Taye T, MD       azithromycin  (ZITHROMAX ) tablet 500 mg  500 mg Oral Daily Hammons, Kimberly B, RPH       budesonide -glycopyrrolate -formoterol  (BREZTRI ) 160-9-4.8 MCG/ACT inhaler 2 puff  2 puff Inhalation BID Gonfa, Taye T, MD       cefTRIAXone  (ROCEPHIN ) 2 g in sodium chloride  0.9 % 100 mL IVPB  2 g Intravenous Q24H Gonfa, Taye T, MD       insulin  aspart (novoLOG ) injection 0-5 Units  0-5 Units Subcutaneous QHS Djan, Prince T, MD       insulin  aspart (novoLOG ) injection 0-9 Units  0-9 Units Subcutaneous TID WC Dorinda Drue DASEN, MD   3 Units at 09/22/23 1147   ipratropium-albuterol  (DUONEB) 0.5-2.5 (3) MG/3ML  nebulizer solution 3 mL  3 mL Nebulization Q4H PRN Gonfa, Taye T, MD       lisinopril  (ZESTRIL ) tablet 20 mg  20 mg Oral QHS Gonfa, Taye T, MD       metFORMIN  (GLUCOPHAGE -XR) 24 hr tablet 500 mg  500 mg Oral QHS Gonfa, Taye T, MD       pantoprazole  (PROTONIX ) EC tablet 40 mg  40 mg Oral BID Gonfa, Taye T, MD         Discharge Medications: Please see discharge summary for a list of discharge medications.  Relevant Imaging Results:  Relevant Lab Results:   Additional Information SSN: [id number], [place]

## 2023-09-22 NOTE — Progress Notes (Addendum)
 UNASSIGNED PATIENT Subjective: Patient seen and examined. Chart reviewed.  Patient seems to be doing well.  She denies any GI complaints at this time.  She denies having abdominal pain nausea or vomiting.  She cannot tell me when her last BM was.  There is no history of blood or mucus in the stool. Hemoglobin today is 8.6 g/dL after unit of packed red blood cells and iron  infusion.  Objective: Vital signs in last 24 hours: Temp:  [98 F (36.7 C)-98.6 F (37 C)] 98.1 F (36.7 C) (09/08 0731) Pulse Rate:  [79-88] 83 (09/08 0731) Resp:  [17-19] 19 (09/08 0731) BP: (129-152)/(53-70) 149/62 (09/08 0731) SpO2:  [88 %-100 %] 100 % (09/08 0731) Last BM Date : 09/20/23  Intake/Output from previous day: 09/07 0701 - 09/08 0700 In: 1390 [P.O.:1040; IV Piggyback:350] Out: 1050 [Urine:1050] Intake/Output this shift: No intake/output data recorded.  General appearance: cooperative, appears stated age, fatigued, and no distress Resp: clear to auscultation bilaterally Cardio: regular rate and rhythm, S1, S2 normal, no murmur, click, rub or gallop GI: soft, non-tender; bowel sounds normal; no masses,  no organomegaly  Lab Results: Recent Labs    09/20/23 0435 09/21/23 0813 09/22/23 0551  WBC 7.3 7.5 8.1  HGB 7.5* 8.5* 8.6*  HCT 26.7* 30.2* 30.1*  PLT 328 452* 448*   BMET Recent Labs    09/20/23 0435 09/21/23 0813 09/22/23 0551  NA 143 139 138  K 3.8 3.5 3.5  CL 106 104 105  CO2 22 24 22   GLUCOSE 137* 132* 121*  BUN 10 5* <5*  CREATININE 0.80 0.59 0.54  CALCIUM  8.5* 8.9 8.9   LFT Recent Labs    09/19/23 1828 09/21/23 0813 09/22/23 0551  PROT 6.6  --   --   ALBUMIN 2.8*   < > 2.8*  AST 20  --   --   ALT 28  --   --   ALKPHOS 138*  --   --   BILITOT 0.7  --   --    < > = values in this interval not displayed.   Studies/Results: No results found.  Medications: I have reviewed the patient's current medications. Prior to Admission:  Medications Prior to Admission   Medication Sig Dispense Refill Last Dose/Taking   albuterol  (VENTOLIN  HFA) 108 (90 Base) MCG/ACT inhaler Inhale 2 puffs into the lungs every 6 (six) hours as needed for wheezing or shortness of breath. 6.7 g 0 Past Week   ALPRAZolam  (XANAX ) 1 MG tablet Take 1 mg by mouth 2 (two) times daily as needed for anxiety or sleep.   Past Week   atorvastatin  (LIPITOR) 40 MG tablet Take 40 mg by mouth daily.   Past Week   BREZTRI  AEROSPHERE 160-9-4.8 MCG/ACT AERO inhaler Inhale 2 puffs into the lungs 2 (two) times daily.   Past Week   insulin  lispro (HUMALOG) 100 UNIT/ML injection Inject 12 Units into the skin 3 (three) times daily before meals.   Past Week   lisinopril  (PRINIVIL ,ZESTRIL ) 20 MG tablet Take 20 mg by mouth at bedtime.   Past Week   metFORMIN  (GLUCOPHAGE -XR) 500 MG 24 hr tablet Take 500 mg by mouth at bedtime.   Past Week   omeprazole (PRILOSEC) 40 MG capsule Take 40 mg by mouth daily.   Past Week   TRESIBA  100 UNIT/ML SOLN Inject 35 Units into the skin at bedtime.   Past Week   aspirin  EC 81 MG tablet Take 81 mg by mouth daily. (Patient not taking:  Reported on 09/20/2023)   Not Taking   clopidogrel  (PLAVIX ) 75 MG tablet Take 1 tablet (75 mg total) by mouth daily. (Patient not taking: Reported on 09/20/2023) 30 tablet 11 Not Taking   OZEMPIC, 2 MG/DOSE, 8 MG/3ML SOPN Inject 2 mg into the skin once a week. (Patient not taking: Reported on 09/20/2023)   Not Taking   QUEtiapine  (SEROQUEL ) 25 MG tablet Take 25 mg by mouth at bedtime. (Patient not taking: Reported on 09/20/2023)   Not Taking   Scheduled:  ALPRAZolam   0.25 mg Oral BID   atorvastatin   40 mg Oral Daily   azithromycin   500 mg Oral Daily   budesonide -glycopyrrolate -formoterol   2 puff Inhalation BID   insulin  aspart  0-5 Units Subcutaneous QHS   insulin  aspart  0-9 Units Subcutaneous TID WC   lisinopril   20 mg Oral QHS   metFORMIN   500 mg Oral QHS   pantoprazole   40 mg Oral BID   Continuous:  cefTRIAXone  (ROCEPHIN )  IV      EMW:pemjumneplf-$[id number]$$MzfnczAzqnmzIZPI_[id number]$   Assessment/Plan: 1) Acute on chronic blood loss anemia with gastric ulcer [neuroendocrine tumor]-currently stable. We will sign off now patient should follow-up with her primary GI doctor in [place] avoidance of all nonsteroidals should be encouraged. She should see her gastroenterologist in 7-10 days post-discharge.  2) Acute hypoxic respiratory failure secondary to multifocal pneumonia/COPD-improved. 3) Acute metabolic encephalopathy. 4) HTN/Hyperlipidemia. 5) Chronic HFpEF. 6) NIDDM/Obesity.  7) Cholelithiasis. 8) Fatty liver.  LOS: 3 days   Renaye Sous 09/22/2023, 7:40 AM

## 2023-09-22 NOTE — Progress Notes (Signed)
 PROGRESS NOTE  Allison Hughes FMW:[id number] DOB: 1950-05-06   PCP: Valma Carwin, MD  Patient is from: Home.  DOA: 09/19/2023 LOS: 3  Chief complaints Chief Complaint  Patient presents with   Altered Mental Status   Shortness of Breath     Brief Narrative / Interim history: 73 year old F with PMH of severe dementia, COPD, DM-2, CVA, GIB, neuroendocrine ulcer, HTN and recent hospitalization at [facility] for UGIB, hypoxic RF, multifocal pneumonia with parapneumonic effusion, cystitis and delirium brought to ED by EMS after her friend found her lying on floor.  It seems therapy recommended SNF but patient chose to go home with home health.  Per discharge summary, she was evaluated by psychiatry and deemed to have capacity to make her own medical decision and discharged home with home health.  Reportedly had recurrent falls at home, about 5 in a week.  Per EMS, she was hypoxic to 80% on room air.  In ED, stable vitals with saturation in 90s on 3 L.  Hgb 6.6 (was 7.5 on discharge from [facility]).  Hemoccult positive.  UDS positive for benzo.  UA with nitrites.  CXR raise concern for vascular congestion.  CT head without acute finding.  CT angio chest negative for PE but bilateral GGO opacity concerning for atypical pneumonia as well as CHF.  CT abdomen and pelvis showed cholelithiasis without acute cholecystitis.  Patient was admitted for ABLA, acute respiratory failure due to pneumonia and acute metabolic encephalopathy.  One unit of blood ordered.  Started on IV antibiotics and Protonix .  GI consulted.  Hemoglobin improved and remained stable after 1 unit.  No report of overt bleeding.    Subjective: Seen and examined earlier this morning.  No major events overnight or this morning.   Awake and alert.  Oriented to self, place, month and year but very limited insight into why she is in the hospital.  Her friend is at bedside.  Objective: Vitals:   09/21/23 1641 09/21/23 2131 09/22/23 0507 09/22/23  0731  BP: (!) 129/53 (!) 152/65 (!) 148/70 (!) 149/62  Pulse: 80 79 83 83  Resp: 18 17 19 19   Temp: 98.6 F (37 C) 98.5 F (36.9 C) 98 F (36.7 C) 98.1 F (36.7 C)  TempSrc:  Oral    SpO2: 99% 92% 95% 100%  Weight:      Height:        Examination:  GENERAL: No apparent distress.  Nontoxic. HEENT: MMM.  Vision and hearing grossly intact.  NECK: Supple.  No apparent JVD.  RESP:  No IWOB.  Fair aeration bilaterally. CVS:  RRR. Heart sounds normal.  ABD/GI/GU: BS+. Abd soft, NTND.  MSK/EXT:  Moves extremities. No apparent deformity. No edema.  SKIN: no apparent skin lesion or wound NEURO: AA.  Oriented to self and place. Follows commands.  No apparent focal neuro deficit. PSYCH: Calm. Normal affect.   Consultants:  Gastroenterology  Procedures: None  Microbiology summarized: COVID-19, influenza, RSV and  a 20 pathogen RVP nonreactive. Urine culture with Klebsiella pneumoniae  Assessment and plan: Acute hypoxic respiratory failure secondary to multifocal pneumonia COPD-not in acute exacerbation -Hypoxic to 80% when EMS arrived at home. -CT angio chest with bilateral GGO opacity.  BNP only 78. -Continue ceftriaxone  and Zithromax  -Resume home Breztri . -DuoNebs as needed. -Wean oxygen as able -Ambulatory saturation  Acute blood loss anemia in a patient with Neuroendocrine ulcer: Hemoccult positive. Recently hospitalized at [facility] and had EGD that demonstrated clean-based gastric neuroendocrine ulcer.    It  seems she was advised to hold antiplatelets until outpatient GI evaluation. Unclear if she resumed them or not.  Hgb improved and stable after 1 unit.  No overt bleeding here. Recent Labs    09/19/23 1836 09/19/23 1845 09/19/23 1914 09/20/23 0435 09/21/23 0813 09/22/23 0551  HGB 6.6* 8.5* 7.5* 7.5* 8.5* 8.6*  -Continue holding antiplatelets-doubt need for DAPT.  -Per GI, primary GI to see on 9/8 -Received IV Venofer  300 mg x 1 on 9/6.  Will try to give  additional dose before discharge. -Check CBC daily -Continue Protonix -changed to p.o. -Continue soft diet.   Acute metabolic encephalopathy likely secondary to above but she could also have some cognitive impairment.  CT head without acute finding.  She had normal B12, folate and TSH at [facility].  She is on Xanax  and Seroquel  at home.  UDS is positive for benzo.  Currently awake and alert and oriented x 4 except date.  For, limited insight into why she is in the hospital. - Continue Xanax  0.25 mg twice daily to prevent withdrawal.  She seems to take 1 mg twice daily at home - Continue holding Seroquel   Chronic HFpEF: TTE in 2024 with LVEF of 65 to 70% and G1 DD.  BNP 1878.  Does not seem to be on diuretics at home.  Appears euvolemic on exam - Monitor fluid and respiratory status clinically  NIDDM-2: A1c 6.9%.  On metformin  at home.  Blood glucose within acceptable range. Recent Labs  Lab 09/21/23 1118 09/21/23 1701 09/21/23 2128 09/22/23 0735 09/22/23 1147  GLUCAP 121* 129* 120* 127* 244*  - Continue SSI-sensitive - Resume home metformin .  Gastric neuroendocrine ulcer -Needs outpatient follow-up with oncology -Continue PPI   Recurrent fall: Found down on the floor by good friend.  Previously recommended SNF but she refused and went home.  At that time, she was deemed to have capacity by psych.  Her encephalopathy improved but she has not demonstrated capacity to make medical decision so far.  Has no family member to help with this either. She has a good friend, Mr. Adin but he is not her POA. -Fall precaution -PT/OT-recommended SNF. -TOC consulted.  Klebsiella pneumoniae UTI -Over by ceftriaxone .  Essential hypertension: BP slightly elevated. - Resume home lisinopril .   Hyperlipidemia -Resume home statin once able to take p.o.  Hypomagnesemia - Replenish and recheck  Class I obesity Body mass index is 32.56 kg/m.           DVT prophylaxis:  SCDs Start:  09/19/23 2320  Code Status: Full code Family Communication: Updated patient's friend, Mr. Adin at bedside Level of care: Med-Surg Status is: Inpatient Remains inpatient appropriate because: GI bleed, pneumonia and respiratory failure   Final disposition: SNF.   55 minutes with more than 50% spent in reviewing records, counseling patient/family and coordinating care.   Sch Meds:  Scheduled Meds:  ALPRAZolam   0.25 mg Oral BID   atorvastatin   40 mg Oral Daily   azithromycin   500 mg Oral Daily   budesonide -glycopyrrolate -formoterol   2 puff Inhalation BID   insulin  aspart  0-5 Units Subcutaneous QHS   insulin  aspart  0-9 Units Subcutaneous TID WC   lisinopril   20 mg Oral QHS   metFORMIN   500 mg Oral QHS   pantoprazole   40 mg Oral BID   Continuous Infusions:  cefTRIAXone  (ROCEPHIN )  IV     PRN Meds:.ipratropium-albuterol   Antimicrobials: Anti-infectives (From admission, onward)    Start     Dose/Rate Route Frequency Ordered Stop  09/22/23 1800  cefTRIAXone  (ROCEPHIN ) 2 g in sodium chloride  0.9 % 100 mL IVPB        2 g 200 mL/hr over 30 Minutes Intravenous Every 24 hours 09/22/23 1258 09/25/23 1759   09/22/23 1800  azithromycin  (ZITHROMAX ) tablet 500 mg        500 mg Oral Daily 09/22/23 1258 09/25/23 1759   09/20/23 1830  azithromycin  (ZITHROMAX ) 500 mg in sodium chloride  0.9 % 250 mL IVPB  Status:  Discontinued        500 mg 250 mL/hr over 60 Minutes Intravenous Every 24 hours 09/20/23 0035 09/22/23 1258   09/20/23 1800  cefTRIAXone  (ROCEPHIN ) 2 g in sodium chloride  0.9 % 100 mL IVPB  Status:  Discontinued        2 g 200 mL/hr over 30 Minutes Intravenous Every 24 hours 09/20/23 0035 09/22/23 1258   09/19/23 2015  cefTRIAXone  (ROCEPHIN ) 1 g in sodium chloride  0.9 % 100 mL IVPB        1 g 200 mL/hr over 30 Minutes Intravenous  Once 09/19/23 2011 09/19/23 2159   09/19/23 2015  azithromycin  (ZITHROMAX ) 500 mg in sodium chloride  0.9 % 250 mL IVPB        500 mg 250 mL/hr  over 60 Minutes Intravenous  Once 09/19/23 2011 09/19/23 2315        I have personally reviewed the following labs and images: CBC: Recent Labs  Lab 09/19/23 1836 09/19/23 1845 09/19/23 1914 09/20/23 0435 09/21/23 0813 09/22/23 0551  WBC 8.4  --   --  7.3 7.5 8.1  NEUTROABS 6.8  --   --   --   --   --   HGB 6.6* 8.5* 7.5* 7.5* 8.5* 8.6*  HCT 24.9* 25.0* 22.0* 26.7* 30.2* 30.1*  MCV 73.2*  --   --  72.8* 72.2* 72.5*  PLT 460*  --   --  328 452* 448*   BMP &GFR Recent Labs  Lab 09/19/23 1828 09/19/23 1845 09/19/23 1914 09/20/23 0435 09/21/23 0813 09/22/23 0551  NA 141 141 142 143 139 138  K 3.7 4.1 3.7 3.8 3.5 3.5  CL 107 109  --  106 104 105  CO2 23  --   --  22 24 22   GLUCOSE 114* 101*  --  137* 132* 121*  BUN 10 10  --  10 5* <5*  CREATININE 0.87 0.80  --  0.80 0.59 0.54  CALCIUM  8.7*  --   --  8.5* 8.9 8.9  MG  --   --   --   --  1.6* 1.8  PHOS  --   --   --   --  2.7 2.7   Estimated Creatinine Clearance: 62.5 mL/min (by C-G formula based on SCr of 0.54 mg/dL). Liver & Pancreas: Recent Labs  Lab 09/19/23 1828 09/21/23 0813 09/22/23 0551  AST 20  --   --   ALT 28  --   --   ALKPHOS 138*  --   --   BILITOT 0.7  --   --   PROT 6.6  --   --   ALBUMIN 2.8* 2.7* 2.8*   Recent Labs  Lab 09/19/23 1836  LIPASE 22   Recent Labs  Lab 09/19/23 1902  AMMONIA <13   Diabetic: Recent Labs    09/20/23 0435  HGBA1C 6.9*   Recent Labs  Lab 09/21/23 1118 09/21/23 1701 09/21/23 2128 09/22/23 0735 09/22/23 1147  GLUCAP 121* 129* 120* 127* 244*   Cardiac  Enzymes: Recent Labs  Lab 09/19/23 1828  CKTOTAL 31*   No results for input(s): PROBNP in the last 8760 hours. Coagulation Profile: No results for input(s): INR, PROTIME in the last 168 hours. Thyroid Function Tests: No results for input(s): TSH, T4TOTAL, FREET4, T3FREE, THYROIDAB in the last 72 hours. Lipid Profile: No results for input(s): CHOL, HDL, LDLCALC, TRIG,  CHOLHDL, LDLDIRECT in the last 72 hours. Anemia Panel: Recent Labs    09/19/23 1943  VITAMINB12 432  FOLATE 19.4  FERRITIN 13  TIBC 361  IRON  <10*  RETICCTPCT 1.5   Urine analysis:    Component Value Date/Time   COLORURINE YELLOW 09/19/2023 2004   APPEARANCEUR HAZY (A) 09/19/2023 2004   LABSPEC 1.027 09/19/2023 2004   PHURINE 5.0 09/19/2023 2004   GLUCOSEU NEGATIVE 09/19/2023 2004   HGBUR NEGATIVE 09/19/2023 2004   BILIRUBINUR NEGATIVE 09/19/2023 2004   KETONESUR NEGATIVE 09/19/2023 2004   PROTEINUR NEGATIVE 09/19/2023 2004   NITRITE POSITIVE (A) 09/19/2023 2004   LEUKOCYTESUR SMALL (A) 09/19/2023 2004   Sepsis Labs: Invalid input(s): PROCALCITONIN, LACTICIDVEN  Microbiology: Recent Results (from the past 240 hours)  Resp panel by RT-PCR (RSV, Flu A&B, Covid) Anterior Nasal Swab     Status: None   Collection Time: 09/19/23  6:35 PM   Specimen: Anterior Nasal Swab  Result Value Ref Range Status   SARS Coronavirus 2 by RT PCR NEGATIVE NEGATIVE Final   Influenza A by PCR NEGATIVE NEGATIVE Final   Influenza B by PCR NEGATIVE NEGATIVE Final    Comment: (NOTE) The Xpert Xpress SARS-CoV-2/FLU/RSV plus assay is intended as an aid in the diagnosis of influenza from Nasopharyngeal swab specimens and should not be used as a sole basis for treatment. Nasal washings and aspirates are unacceptable for Xpert Xpress SARS-CoV-2/FLU/RSV testing.  Fact Sheet for Patients: BloggerCourse.com  Fact Sheet for Healthcare Providers: SeriousBroker.it  This test is not yet approved or cleared by the [place]  FDA and has been authorized for detection and/or diagnosis of SARS-CoV-2 by FDA under an Emergency Use Authorization (EUA). This EUA will remain in effect (meaning this test can be used) for the duration of the COVID-19 declaration under Section 564(b)(1) of the Act, 21 U.S.C. section 360bbb-3(b)(1), unless the  authorization is terminated or revoked.     Resp Syncytial Virus by PCR NEGATIVE NEGATIVE Final    Comment: (NOTE) Fact Sheet for Patients: BloggerCourse.com  Fact Sheet for Healthcare Providers: SeriousBroker.it  This test is not yet approved or cleared by the [place]  FDA and has been authorized for detection and/or diagnosis of SARS-CoV-2 by FDA under an Emergency Use Authorization (EUA). This EUA will remain in effect (meaning this test can be used) for the duration of the COVID-19 declaration under Section 564(b)(1) of the Act, 21 U.S.C. section 360bbb-3(b)(1), unless the authorization is terminated or revoked.  Performed at [facility], [address], [place], [place] [address]   Urine Culture     Status: Abnormal   Collection Time: 09/19/23  9:47 PM   Specimen: Urine, Clean Catch  Result Value Ref Range Status   Specimen Description URINE, CLEAN CATCH  Final   Special Requests   Final    NONE Performed at [facility], [address], [place], [place] [address]    Culture 20,000 COLONIES/mL KLEBSIELLA PNEUMONIAE (A)  Final   Report Status 09/21/2023 FINAL  Final   Organism ID, Bacteria KLEBSIELLA PNEUMONIAE (A)  Final      Susceptibility  Klebsiella pneumoniae - MIC*    AMPICILLIN >=32 RESISTANT Resistant     CEFAZOLIN  (URINE) Value in next row Sensitive      2 SENSITIVEThis is a modified FDA-approved test that has been validated and its performance characteristics determined by the reporting laboratory.  This laboratory is certified under the Clinical Laboratory Improvement Amendments CLIA as qualified to perform high complexity clinical laboratory testing.    CEFEPIME Value in next row Sensitive      2 SENSITIVEThis is a modified FDA-approved test that has been validated and its performance characteristics determined by the reporting laboratory.  This laboratory is certified under the Clinical  Laboratory Improvement Amendments CLIA as qualified to perform high complexity clinical laboratory testing.    ERTAPENEM Value in next row Sensitive      2 SENSITIVEThis is a modified FDA-approved test that has been validated and its performance characteristics determined by the reporting laboratory.  This laboratory is certified under the Clinical Laboratory Improvement Amendments CLIA as qualified to perform high complexity clinical laboratory testing.    CEFTRIAXONE  Value in next row Sensitive      2 SENSITIVEThis is a modified FDA-approved test that has been validated and its performance characteristics determined by the reporting laboratory.  This laboratory is certified under the Clinical Laboratory Improvement Amendments CLIA as qualified to perform high complexity clinical laboratory testing.    CIPROFLOXACIN Value in next row Sensitive      2 SENSITIVEThis is a modified FDA-approved test that has been validated and its performance characteristics determined by the reporting laboratory.  This laboratory is certified under the Clinical Laboratory Improvement Amendments CLIA as qualified to perform high complexity clinical laboratory testing.    GENTAMICIN Value in next row Sensitive      2 SENSITIVEThis is a modified FDA-approved test that has been validated and its performance characteristics determined by the reporting laboratory.  This laboratory is certified under the Clinical Laboratory Improvement Amendments CLIA as qualified to perform high complexity clinical laboratory testing.    NITROFURANTOIN Value in next row Resistant      2 SENSITIVEThis is a modified FDA-approved test that has been validated and its performance characteristics determined by the reporting laboratory.  This laboratory is certified under the Clinical Laboratory Improvement Amendments CLIA as qualified to perform high complexity clinical laboratory testing.    TRIMETH/SULFA Value in next row Sensitive      2  SENSITIVEThis is a modified FDA-approved test that has been validated and its performance characteristics determined by the reporting laboratory.  This laboratory is certified under the Clinical Laboratory Improvement Amendments CLIA as qualified to perform high complexity clinical laboratory testing.    AMPICILLIN/SULBACTAM Value in next row Sensitive      2 SENSITIVEThis is a modified FDA-approved test that has been validated and its performance characteristics determined by the reporting laboratory.  This laboratory is certified under the Clinical Laboratory Improvement Amendments CLIA as qualified to perform high complexity clinical laboratory testing.    PIP/TAZO Value in next row Sensitive ug/mL     <=4 SENSITIVEThis is a modified FDA-approved test that has been validated and its performance characteristics determined by the reporting laboratory.  This laboratory is certified under the Clinical Laboratory Improvement Amendments CLIA as qualified to perform high complexity clinical laboratory testing.    MEROPENEM Value in next row Sensitive      <=4 SENSITIVEThis is a modified FDA-approved test that has been validated and its performance characteristics determined by the reporting laboratory.  This laboratory is certified under the Clinical Laboratory Improvement Amendments CLIA as qualified to perform high complexity clinical laboratory testing.    * 20,000 COLONIES/mL KLEBSIELLA PNEUMONIAE  Respiratory (~20 pathogens) panel by PCR     Status: None   Collection Time: 09/20/23  2:08 AM   Specimen: Nasopharyngeal Swab; Respiratory  Result Value Ref Range Status   Adenovirus NOT DETECTED NOT DETECTED Final   Coronavirus 229E NOT DETECTED NOT DETECTED Final    Comment: (NOTE) The Coronavirus on the Respiratory Panel, DOES NOT test for the novel  Coronavirus (2019 nCoV)    Coronavirus HKU1 NOT DETECTED NOT DETECTED Final   Coronavirus NL63 NOT DETECTED NOT DETECTED Final   Coronavirus OC43 NOT  DETECTED NOT DETECTED Final   Metapneumovirus NOT DETECTED NOT DETECTED Final   Rhinovirus / Enterovirus NOT DETECTED NOT DETECTED Final   Influenza A NOT DETECTED NOT DETECTED Final   Influenza B NOT DETECTED NOT DETECTED Final   Parainfluenza Virus 1 NOT DETECTED NOT DETECTED Final   Parainfluenza Virus 2 NOT DETECTED NOT DETECTED Final   Parainfluenza Virus 3 NOT DETECTED NOT DETECTED Final   Parainfluenza Virus 4 NOT DETECTED NOT DETECTED Final   Respiratory Syncytial Virus NOT DETECTED NOT DETECTED Final   Bordetella pertussis NOT DETECTED NOT DETECTED Final   Bordetella Parapertussis NOT DETECTED NOT DETECTED Final   Chlamydophila pneumoniae NOT DETECTED NOT DETECTED Final   Mycoplasma pneumoniae NOT DETECTED NOT DETECTED Final    Comment: Performed at [facility], [address], [place], [place] [address]    Radiology Studies: No results found.     Starlynn Klinkner T. Retia Cordle [facility]  If 7PM-7AM, please contact night-coverage [email or web address] 09/22/2023, 1:00 PM

## 2023-09-22 NOTE — TOC Initial Note (Signed)
 Transition of Care [facility]) - Initial/Assessment Note    Patient Details  Name: Allison Hughes MRN: [id number] Date of Birth: [birth year]  [facility] CM/SW Contact:    Lendia Dais, LCSWA Phone Number: 09/22/2023, 2:46 PM  Clinical Narrative: CSW spoke to patient at bedside.  Pt is from home alone and gave verbal permission to contact S.O. Manus Cong. Pt has no DME, doesn't drive, and is independent with ADL's. Pt does not drive but Aleene drives the pt to medical appointments.  Pt does not have a HCPOA but requested information about one. Pt requested a shower chair, RNCM notified.   CSW informed pt of PT recs of SNF. Pt was agreeable and had a preference of [facility], [facility], and [facility] CSW explained that bed offers depend on insurance being in network and bed availability. Medicare choice list given.  Referrals sent in the HUB, CSW will continue to follow.                 Expected Discharge Plan: Skilled Nursing Facility Barriers to Discharge: Continued Medical Work up   Patient Goals and CMS Choice   CMS Medicare.gov Compare Post Acute Care list provided to:: Patient Choice offered to / list presented to : Patient      Expected Discharge Plan and Services In-house Referral: Clinical Social Work     Living arrangements for the past 2 months: Single Family Home                                      Prior Living Arrangements/Services Living arrangements for the past 2 months: Single Family Home Lives with:: Self Patient language and need for interpreter reviewed:: Yes Do you feel safe going back to the place where you live?: Yes      Need for Family Participation in Patient Care: Yes (Comment) Care giver support system in place?: Yes (comment)   Criminal Activity/Legal Involvement Pertinent to Current Situation/Hospitalization: No - Comment as needed  Activities of Daily Living      Permission Sought/Granted Permission  sought to share information with : Family Supports Permission granted to share information with : Yes, Verbal Permission Granted  Share Information with NAME: Cong Manus     Permission granted to share info w Relationship: Significant other  Permission granted to share info w Contact Information: [phone]  Emotional Assessment Appearance:: Appears older than stated age Attitude/Demeanor/Rapport: Engaged Affect (typically observed): Pleasant, Appropriate Orientation: : Oriented to Self, Oriented to Situation, Oriented to Place, Oriented to  Time Alcohol / Substance Use: Not Applicable Psych Involvement: No (comment)  Admission diagnosis:  Acute cystitis without hematuria [N30.00] Altered mental status, unspecified altered mental status type [R41.82] Gastrointestinal hemorrhage, unspecified gastrointestinal hemorrhage type [K92.2] Anemia, unspecified type [D64.9] Community acquired pneumonia, unspecified laterality [J18.9] Acute congestive heart failure, unspecified heart failure type (HCC) [I50.9] ABLA (acute blood loss anemia) [D62] Patient Active Problem List   Diagnosis Date Noted   Acute respiratory failure with hypoxia (HCC) 09/20/2023   ABLA (acute blood loss anemia) 09/19/2023   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   Neurocognitive disorder 08/31/2023   Impaired mobility and ADLs 08/20/2023   Anemia 09/02/2022   Aphasia 06/03/2022   History of stroke 09/06/2020   DM (diabetes mellitus), type 2 (HCC) 11/20/2018   Hypokalemia 02/02/2016   Anxiety 02/02/2016   Essential hypertension 02/02/2016   PCP:  Valma Carwin, MD Pharmacy:  [facility] - [place], [place] - [address] [place] [address] Phone: [phone] Fax: [phone]  [facility] - [place], [place] - [address] [facility] [address] [employer] [address] [place] [address] Phone: [phone] Fax:  [phone]  [facility] [address] [place] [address] Phone: [phone] Fax: [phone]     Social Drivers of Health (SDOH) Social History: SDOH Screenings   Food Insecurity: Patient Unable To Answer (09/21/2023)  Housing: Unknown (09/21/2023)  Transportation Needs: Patient Unable To Answer (09/21/2023)  Utilities: Patient Unable To Answer (09/21/2023)  Depression (PHQ2-9): Low Risk  (10/26/2020)  Social Connections: Patient Unable To Answer (09/21/2023)  Tobacco Use: High Risk (09/19/2023)   SDOH Interventions:     Readmission Risk Interventions     No data to display

## 2023-09-23 ENCOUNTER — Inpatient Hospital Stay (HOSPITAL_COMMUNITY)

## 2023-09-23 DIAGNOSIS — Z794 Long term (current) use of insulin: Secondary | ICD-10-CM

## 2023-09-23 DIAGNOSIS — I10 Essential (primary) hypertension: Secondary | ICD-10-CM

## 2023-09-23 DIAGNOSIS — E1165 Type 2 diabetes mellitus with hyperglycemia: Secondary | ICD-10-CM | POA: Diagnosis not present

## 2023-09-23 DIAGNOSIS — R419 Unspecified symptoms and signs involving cognitive functions and awareness: Secondary | ICD-10-CM

## 2023-09-23 DIAGNOSIS — D62 Acute posthemorrhagic anemia: Secondary | ICD-10-CM | POA: Diagnosis not present

## 2023-09-23 DIAGNOSIS — Z7409 Other reduced mobility: Secondary | ICD-10-CM

## 2023-09-23 DIAGNOSIS — C7A8 Other malignant neuroendocrine tumors: Secondary | ICD-10-CM | POA: Diagnosis not present

## 2023-09-23 DIAGNOSIS — Z789 Other specified health status: Secondary | ICD-10-CM

## 2023-09-23 DIAGNOSIS — J9601 Acute respiratory failure with hypoxia: Secondary | ICD-10-CM

## 2023-09-23 LAB — GLUCOSE, CAPILLARY
Glucose-Capillary: 136 mg/dL — ABNORMAL HIGH (ref 70–99)
Glucose-Capillary: 149 mg/dL — ABNORMAL HIGH (ref 70–99)
Glucose-Capillary: 214 mg/dL — ABNORMAL HIGH (ref 70–99)
Glucose-Capillary: 230 mg/dL — ABNORMAL HIGH (ref 70–99)
Glucose-Capillary: 258 mg/dL — ABNORMAL HIGH (ref 70–99)

## 2023-09-23 LAB — CBC
HCT: 30.1 % — ABNORMAL LOW (ref 36.0–46.0)
Hemoglobin: 8.5 g/dL — ABNORMAL LOW (ref 12.0–15.0)
MCH: 20.5 pg — ABNORMAL LOW (ref 26.0–34.0)
MCHC: 28.2 g/dL — ABNORMAL LOW (ref 30.0–36.0)
MCV: 72.5 fL — ABNORMAL LOW (ref 80.0–100.0)
Platelets: 479 K/uL — ABNORMAL HIGH (ref 150–400)
RBC: 4.15 MIL/uL (ref 3.87–5.11)
RDW: 22.4 % — ABNORMAL HIGH (ref 11.5–15.5)
WBC: 8.1 K/uL (ref 4.0–10.5)
nRBC: 0 % (ref 0.0–0.2)

## 2023-09-23 LAB — MAGNESIUM: Magnesium: 1.7 mg/dL (ref 1.7–2.4)

## 2023-09-23 LAB — RENAL FUNCTION PANEL
Albumin: 2.6 g/dL — ABNORMAL LOW (ref 3.5–5.0)
Anion gap: 10 (ref 5–15)
BUN: <5 mg/dL — ABNORMAL LOW (ref 8–23)
CO2: 22 mmol/L (ref 22–32)
Calcium: 9 mg/dL (ref 8.9–10.3)
Chloride: 108 mmol/L (ref 98–111)
Creatinine, Ser: 0.6 mg/dL (ref 0.44–1.00)
GFR, Estimated: >60 mL/min (ref >60)
Glucose, Bld: 130 mg/dL — ABNORMAL HIGH (ref 70–99)
Phosphorus: 2.9 mg/dL (ref 2.5–4.6)
Potassium: 3.5 mmol/L (ref 3.5–5.1)
Sodium: 140 mmol/L (ref 135–145)

## 2023-09-23 MED ORDER — PANTOPRAZOLE SODIUM 40 MG PO TBEC: DELAYED_RELEASE_TABLET | ORAL | Status: DC

## 2023-09-23 MED ORDER — FENTANYL CITRATE PF 50 MCG/ML IJ SOSY: 25.0000 ug | PREFILLED_SYRINGE | INTRAMUSCULAR | Status: DC | PRN

## 2023-09-23 MED ORDER — ACETAMINOPHEN 500 MG PO TABS: 1000.0000 mg | ORAL_TABLET | Freq: Three times a day (TID) | ORAL | Status: DC | PRN

## 2023-09-23 MED ORDER — TRESIBA 100 UNIT/ML ~~LOC~~ SOLN: 15.0000 [IU] | Freq: Every day | SUBCUTANEOUS | Status: AC

## 2023-09-23 MED ORDER — ALPRAZOLAM 0.25 MG PO TABS: 0.2500 mg | ORAL_TABLET | Freq: Two times a day (BID) | ORAL | 0 refills | Status: DC | PRN

## 2023-09-23 MED ORDER — NALOXONE HCL 0.4 MG/ML IJ SOLN: 0.4000 mg | INTRAMUSCULAR | Status: DC | PRN

## 2023-09-23 MED ORDER — IRON SUCROSE 300 MG IVPB - SIMPLE MED
300.0000 mg | Freq: Once | Status: AC
  Administered 2023-09-23: 300 mg via INTRAVENOUS
  Filled 2023-09-23: qty 300

## 2023-09-23 NOTE — Discharge Summary (Signed)
 Physician Discharge Summary  Allison Hughes FMW:[id number] DOB: [birth year] DOA: 09/19/2023  PCP: Valma Carwin, MD  Admit date: 09/19/2023 Discharge date: 09/23/23  Admitted From: Home Disposition: SNF Recommendations for Outpatient Follow-up:  Outpatient follow-up with primary GI Outpatient follow-up with oncology.  Patient friend to reschedule this Formal cognitive evaluation outpatient when patient is in her normal environment Check CMP and CBC at follow-up Please follow up on the following pending results: None   Discharge Condition: Stable CODE STATUS: Full code Diet Orders (From admission, onward)     Start     Ordered   09/21/23 1302  DIET DYS 3 Room service appropriate? Yes; Fluid consistency: Thin  Diet effective now       Question Answer Comment  Room service appropriate? Yes   Fluid consistency: Thin      09/21/23 1301              Hospital course 73 year old F with PMH of severe dementia, COPD, DM-2, CVA, GIB, neuroendocrine ulcer, HTN and recent hospitalization at [facility] for UGIB, hypoxic RF, multifocal pneumonia with parapneumonic effusion, cystitis and delirium brought to ED by EMS after her friend found her lying on floor.  It seems therapy recommended SNF but patient chose to go home with home health.  Per discharge summary, she was evaluated by psychiatry and deemed to have capacity to make her own medical decision and discharged home with home health.  Reportedly had recurrent falls at home, about 5 in a week.  Per EMS, she was hypoxic to 80% on room air.   In ED, stable vitals with saturation in 90s on 3 L.  Hgb 6.6 (was 7.5 on discharge from [facility]).  Hemoccult positive.  UDS positive for benzo.  UA with nitrites.  CXR raise concern for vascular congestion.  CT head without acute finding.  CT angio chest negative for PE but bilateral GGO opacity concerning for atypical pneumonia as well as CHF.  CT abdomen and pelvis showed cholelithiasis without acute  cholecystitis.  Patient was admitted for ABLA, acute respiratory failure due to pneumonia and acute metabolic encephalopathy.  One unit of blood ordered.  Started on IV antibiotics and Protonix .  GI consulted.   Hemoglobin improved and remained stable after 1 unit.  No report of overt bleeding during hospitalization.  GI recommended outpatient follow-up and signed off.  Plavix  and aspirin  discontinued.  Patient completed  antibiotic course for pneumonia and UTI.  Encephalopathy resolved.  She is awake and alert and oriented to self, place, person and sometimes time which might be her baseline..   See individual problem list below for more.   Problems addressed during this hospitalization Acute hypoxic respiratory failure secondary to multifocal pneumonia: Resolved. COPD-not in acute exacerbation -Hypoxic to 80% when EMS arrived at home. -CT angio chest with bilateral GGO opacity.  BNP only 78. -Completed antibiotic course with ceftriaxone  and Zithromax  -Continue home Breztri  and albuterol . -DuoNebs as needed.   Acute blood loss anemia in a patient with Neuroendocrine ulcer: Hemoccult positive. Recently hospitalized at [facility] and had EGD that demonstrated clean-based gastric neuroendocrine ulcer.    It seems she was advised to hold antiplatelets until outpatient GI evaluation. Unclear if she resumed them or not.  Transfused 1 unit.  Hgb improved to 8.5 and remained stable afterward.   -Continue holding antiplatelets-I do not see ongoing indication for this. -GI recommended outpatient follow-up with primary GI -Patient/friend to reschedule oncology follow-up outpatient for neuroendocrine ulcer -Received IV Venofer  300 mg on  9/6 and 9/9.  -Continue Protonix  40 mg twice daily for 1 month followed by 40 mg daily afterward -Check CBC in 1 week -Continue soft diet.   Acute metabolic encephalopathy likely secondary to above but she could also have some cognitive impairment.  CT head without acute  finding.  She had normal B12, folate and TSH at [facility].  She is on Xanax  and Seroquel  at home.  UDS is positive for benzo.  Currently awake and alert and oriented except time although she remembers month and year at times.  For, limited insight into why she is in the hospital. -Continue Xanax  0.25 mg twice daily as needed.  Was on 1 mg twice daily at home. -Discontinue Seroquel  -Recommend formal cognitive evaluation outpatient   Chronic HFpEF: TTE in 2024 with LVEF of 65 to 70% and G1 DD.  BNP 78.  Does not seem to be on diuretics at home.  Appears euvolemic on exam   NIDDM-2: A1c 6.9%.  On metformin  at home.  Blood glucose within acceptable range. -Continue home metformin  -Decrease home Tresiba  from 35 units to 15 units daily -Discontinue home NovoLog  -Discontinue Ozempic.   Gastric neuroendocrine ulcer -Patient/friend to reschedule outpatient follow-up with oncology -Continue PPI as above. -Discontinued DAPT.   Recurrent fall: Found down on the floor by good friend.  Previously recommended SNF but she refused and went home.  At that time, she was deemed to have capacity by psych.  Her encephalopathy improved but she has not demonstrated capacity to make medical decision so far.  Has no family member to help with this either. She has a good friend, Mr. Adin but he is not her POA. -Fall precaution -PT/OT-recommended SNF. -TOC consulted.   Klebsiella pneumoniae UTI - Completed course with IV ceftriaxone .   Essential hypertension: BP slightly elevated but acceptable. - Continue home lisinopril    Hyperlipidemia - Continue on statin.   Hypomagnesemia: Resolved.   Class I obesity Body mass index is 32.56 kg/m.           Consultations: Gastroenterology  Time spent 35  minutes  Vital signs Vitals:   09/22/23 0731 09/22/23 1531 09/22/23 2106 09/23/23 0424  BP: (!) 149/62 139/61 (!) 147/62 (!) 143/64  Pulse: 83 87 86 90  Temp: 98.1 F (36.7 C) 98.1 F (36.7 C) 98 F  (36.7 C) 98.3 F (36.8 C)  Resp: 19 18 18 16   Height:      Weight:      SpO2: 100% 100% 91% 94%  TempSrc:  Oral Oral Oral  BMI (Calculated):         Discharge exam  GENERAL: No apparent distress.  Nontoxic. HEENT: MMM.  Vision and hearing grossly intact.  NECK: Supple.  No apparent JVD.  RESP:  No IWOB.  Fair aeration bilaterally. CVS:  RRR. Heart sounds normal.  ABD/GI/GU: BS+. Abd soft, NTND.  MSK/EXT:  Moves extremities. No apparent deformity. No edema.  SKIN: no apparent skin lesion or wound NEURO: Awake and alert. Oriented to self, person and place but not time.  No apparent focal neuro deficit. PSYCH: Calm. Normal affect.   Discharge Instructions Discharge Instructions     Increase activity slowly   Complete by: As directed       Allergies as of 09/23/2023       Reactions   Iodine Swelling   Shrimp [shellfish Allergy] Swelling   Fresh shrimp        Medication List     STOP taking these medications  aspirin  EC 81 MG tablet   clopidogrel  75 MG tablet Commonly known as: PLAVIX    insulin  lispro 100 UNIT/ML injection Commonly known as: HUMALOG   omeprazole 40 MG capsule Commonly known as: PRILOSEC   Ozempic (2 MG/DOSE) 8 MG/3ML Sopn Generic drug: Semaglutide (2 MG/DOSE)   QUEtiapine  25 MG tablet Commonly known as: SEROQUEL        TAKE these medications    acetaminophen  500 MG tablet Commonly known as: TYLENOL  Take 2 tablets (1,000 mg total) by mouth every 8 (eight) hours as needed for mild pain (pain score 1-3) or headache.   albuterol  108 (90 Base) MCG/ACT inhaler Commonly known as: VENTOLIN  HFA Inhale 2 puffs into the lungs every 6 (six) hours as needed for wheezing or shortness of breath.   ALPRAZolam  0.25 MG tablet Commonly known as: XANAX  Take 1 tablet (0.25 mg total) by mouth 2 (two) times daily as needed for anxiety. What changed:  medication strength how much to take reasons to take this   atorvastatin  40 MG tablet Commonly  known as: LIPITOR Take 40 mg by mouth daily.   Breztri  Aerosphere 160-9-4.8 MCG/ACT Aero inhaler Generic drug: budesonide -glycopyrrolate -formoterol  Inhale 2 puffs into the lungs 2 (two) times daily.   lisinopril  20 MG tablet Commonly known as: ZESTRIL  Take 20 mg by mouth at bedtime.   metFORMIN  500 MG 24 hr tablet Commonly known as: GLUCOPHAGE -XR Take 500 mg by mouth at bedtime.   pantoprazole  40 MG tablet Commonly known as: PROTONIX  Take 1 tablet (40 mg total) by mouth 2 (two) times daily for 30 days, THEN 1 tablet (40 mg total) daily. Start taking on: September 23, 2023   Tresiba  100 UNIT/ML Soln Generic drug: Insulin  Degludec Inject 15 Units into the skin at bedtime. What changed: how much to take         Procedures/Studies: None   CT Angio Chest PE W and/or Wo Contrast Result Date: 09/19/2023 EXAM: CTA CHEST PE WITH/WITHOUT AND WITH CONTRAST CT ABDOMEN AND PELVIS WITH/WITHOUT AND WITH CONTRAST 09/19/2023 07:32:11 PM TECHNIQUE: CTA of the chest was performed after the administration of intravenous contrast (iohexol  (OMNIPAQUE ) 350 MG/ML injection 75 mL). Multiplanar reformatted images are provided for review. MIP images are provided for review. CT of the abdomen and pelvis was performed with the administration of intravenous contrast. Automated exposure control, iterative reconstruction, and/or weight based adjustment of the mA/kV was utilized to reduce the radiation dose to as low as reasonably achievable. COMPARISON: Comparison with same day chest radiograph; CT chest pelvis 13/20/25. CLINICAL HISTORY: Pulmonary embolism (PE) suspected, high prob. Pt AMS, unable to follow breathing instructions at this time. Abdominal pain. Shortness of breath. FINDINGS: CHEST: PULMONARY ARTERIES: Pulmonary arteries are adequately opacified for evaluation. No intraluminal filling defect to suggest pulmonary embolism. MEDIASTINUM: New mediastinal and hilar lymphadenopathy. For example, a AP  window node on series 6 image 122 measuring 1.3 cm and a 1.3 cm right hilar node on series 6 image 159 measuring 1.3 cm. The heart and pericardium demonstrate no acute abnormality. There is no acute abnormality of the thoracic aorta. Coronary artery atherosclerotic calcifications are present. LUNGS AND PLEURA: Diffuse bilateral ground-glass opacities with interlobular septal thickening and small bilateral pleural effusions most suggestive of congestive heart failure. Atelectasis in the lower lobes. No pneumothorax. SOFT TISSUES AND BONES: No acute bone or soft tissue abnormality. ABDOMEN AND PELVIS: LIVER: Hepatic steatosis. GALLBLADDER AND BILE DUCTS: Cholelithiasis. No evidence of acute cholecystitis. No biliary ductal dilatation. SPLEEN: Spleen demonstrates no acute abnormality. PANCREAS:  Pancreas demonstrates no acute abnormality. 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 No follow-up recommended. Adrenal glands are otherwise unremarkable. KIDNEYS, URETERS AND BLADDER: No stones in the kidneys or ureters. No hydronephrosis. No perinephric or periureteral stranding. Urinary bladder is unremarkable. GI AND BOWEL: Stomach and duodenal sweep demonstrate no acute abnormality. There is no bowel obstruction. No abnormal bowel wall thickening or distension. REPRODUCTIVE: Reproductive organs are unremarkable. PERITONEUM AND RETROPERITONEUM: No ascites or free air. LYMPH NODES: No lymphadenopathy in the abdomen or pelvis . BONES AND SOFT TISSUES: No acute abnormality of the visualized bones. No focal soft tissue abnormality. IMPRESSION: 1. No pulmonary embolism. 2. Diffuse bilateral ground-glass opacities with interlobular septal thickening and small bilateral pleural effusions, most suggestive of congestive heart failure. Atypical pneumonia could appear similarly. 3. New mediastinal and hilar lymphadenopathy, likely reactive. Continue observation on follow-up . 4. Hepatic steatosis. 5. Cholelithiasis  without evidence of acute cholecystitis. Electronically signed by: Norman Gatlin MD 09/19/2023 07:57 PM EDT RP Workstation: [id number]   CT ABDOMEN PELVIS W CONTRAST Result Date: 09/19/2023 EXAM: CTA CHEST PE WITH/WITHOUT AND WITH CONTRAST CT ABDOMEN AND PELVIS WITH/WITHOUT AND WITH CONTRAST 09/19/2023 07:32:11 PM TECHNIQUE: CTA of the chest was performed after the administration of intravenous contrast (iohexol  (OMNIPAQUE ) 350 MG/ML injection 75 mL). Multiplanar reformatted images are provided for review. MIP images are provided for review. CT of the abdomen and pelvis was performed with the administration of intravenous contrast. Automated exposure control, iterative reconstruction, and/or weight based adjustment of the mA/kV was utilized to reduce the radiation dose to as low as reasonably achievable. COMPARISON: Comparison with same day chest radiograph; CT chest pelvis 13/20/25. CLINICAL HISTORY: Pulmonary embolism (PE) suspected, high prob. Pt AMS, unable to follow breathing instructions at this time. Abdominal pain. Shortness of breath. FINDINGS: CHEST: PULMONARY ARTERIES: Pulmonary arteries are adequately opacified for evaluation. No intraluminal filling defect to suggest pulmonary embolism. MEDIASTINUM: New mediastinal and hilar lymphadenopathy. For example, a AP window node on series 6 image 122 measuring 1.3 cm and a 1.3 cm right hilar node on series 6 image 159 measuring 1.3 cm. The heart and pericardium demonstrate no acute abnormality. There is no acute abnormality of the thoracic aorta. Coronary artery atherosclerotic calcifications are present. LUNGS AND PLEURA: Diffuse bilateral ground-glass opacities with interlobular septal thickening and small bilateral pleural effusions most suggestive of congestive heart failure. Atelectasis in the lower lobes. No pneumothorax. SOFT TISSUES AND BONES: No acute bone or soft tissue abnormality. ABDOMEN AND PELVIS: LIVER: Hepatic steatosis. GALLBLADDER AND BILE  DUCTS: Cholelithiasis. No evidence of acute cholecystitis. No biliary ductal dilatation. SPLEEN: Spleen demonstrates no acute abnormality. PANCREAS: Pancreas demonstrates no acute abnormality. 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 No follow-up recommended. Adrenal glands are otherwise unremarkable. KIDNEYS, URETERS AND BLADDER: No stones in the kidneys or ureters. No hydronephrosis. No perinephric or periureteral stranding. Urinary bladder is unremarkable. GI AND BOWEL: Stomach and duodenal sweep demonstrate no acute abnormality. There is no bowel obstruction. No abnormal bowel wall thickening or distension. REPRODUCTIVE: Reproductive organs are unremarkable. PERITONEUM AND RETROPERITONEUM: No ascites or free air. LYMPH NODES: No lymphadenopathy in the abdomen or pelvis . BONES AND SOFT TISSUES: No acute abnormality of the visualized bones. No focal soft tissue abnormality. IMPRESSION: 1. No pulmonary embolism. 2. Diffuse bilateral ground-glass opacities with interlobular septal thickening and small bilateral pleural effusions, most suggestive of congestive heart failure. Atypical pneumonia could appear similarly. 3. New mediastinal and hilar lymphadenopathy, likely reactive. Continue observation on  follow-up . 4. Hepatic steatosis. 5. Cholelithiasis without evidence of acute cholecystitis. Electronically signed by: Norman Gatlin MD 09/19/2023 07:57 PM EDT RP Workstation: [id number]   CT Head Wo Contrast Result Date: 09/19/2023 CLINICAL DATA:  Delirium EXAM: CT HEAD WITHOUT CONTRAST TECHNIQUE: Contiguous axial images were obtained from the base of the skull through the vertex without intravenous contrast. RADIATION DOSE REDUCTION: This exam was performed according to the departmental dose-optimization program which includes automated exposure control, adjustment of the mA and/or kV according to patient size and/or use of iterative reconstruction technique. COMPARISON:  MRI head Jun 04, 2022.  CT head 08/01/2023. FINDINGS: Brain: Similar remote infarct in the right parietal lobe with a severe patchy and confluent white matter hypoattenuation, compatible with chronic microvascular ischemic change. Additional remote infarct in the left cerebellum. Chronic basal ganglia calcifications. No evidence of acute large vascular territory infarct, acute hemorrhage, mass lesion or midline shift. Vascular: Calcific atherosclerosis.  No hyperdense vessel. Skull: No acute fracture. Sinuses/Orbits: Clear sinuses.  No acute orbital findings. Other: No mastoid effusions. IMPRESSION: 1. No evidence of acute intracranial abnormality. 2. 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 Electronically Signed   By: Gilmore GORMAN Molt M.D.   On: 09/19/2023 19:44   DG Chest Portable 1 View Result Date: 09/19/2023 CLINICAL DATA:  Short of breath EXAM: PORTABLE CHEST 1 VIEW COMPARISON:  08/28/2023 FINDINGS: Single frontal view of the chest demonstrates a stable cardiac silhouette. There is increased pulmonary vascular congestion, with basilar predominant interstitial and airspace opacities compatible with edema. No large effusion or pneumothorax. No acute bony abnormalities. IMPRESSION: 1. Findings most consistent with congestive heart failure and mild pulmonary edema. Electronically Signed   By: Ozell Daring M.D.   On: 09/19/2023 19:40   CUP PACEART REMOTE DEVICE CHECK Result Date: 09/18/2023 ILR summary report received. Battery status OK. Normal device function. No new symptom, tachy, brady, or pause episodes. No new AF episodes. Monthly summary reports and ROV/PRN - CS, CVRS      The results of significant diagnostics from this hospitalization (including imaging, microbiology, ancillary and laboratory) are listed below for reference.     Microbiology: Recent Results (from the past 240 hours)  Resp panel by RT-PCR (RSV, Flu A&B, Covid) Anterior Nasal Swab     Status: None   Collection  Time: 09/19/23  6:35 PM   Specimen: Anterior Nasal Swab  Result Value Ref Range Status   SARS Coronavirus 2 by RT PCR NEGATIVE NEGATIVE Final   Influenza A by PCR NEGATIVE NEGATIVE Final   Influenza B by PCR NEGATIVE NEGATIVE Final    Comment: (NOTE) The Xpert Xpress SARS-CoV-2/FLU/RSV plus assay is intended as an aid in the diagnosis of influenza from Nasopharyngeal swab specimens and should not be used as a sole basis for treatment. Nasal washings and aspirates are unacceptable for Xpert Xpress SARS-CoV-2/FLU/RSV testing.  Fact Sheet for Patients: BloggerCourse.com  Fact Sheet for Healthcare Providers: SeriousBroker.it  This test is not yet approved or cleared by the [place]  FDA and has been authorized for detection and/or diagnosis of SARS-CoV-2 by FDA under an Emergency Use Authorization (EUA). This EUA will remain in effect (meaning this test can be used) for the duration of the COVID-19 declaration under Section 564(b)(1) of the Act, 21 U.S.C. section 360bbb-3(b)(1), unless the authorization is terminated or revoked.     Resp Syncytial Virus by PCR NEGATIVE NEGATIVE Final    Comment: (NOTE) Fact Sheet for Patients: BloggerCourse.com  Fact Sheet for Healthcare Providers:  SeriousBroker.it  This test is not yet approved or cleared by the [place]  FDA and has been authorized for detection and/or diagnosis of SARS-CoV-2 by FDA under an Emergency Use Authorization (EUA). This EUA will remain in effect (meaning this test can be used) for the duration of the COVID-19 declaration under Section 564(b)(1) of the Act, 21 U.S.C. section 360bbb-3(b)(1), unless the authorization is terminated or revoked.  Performed at [facility], [address] [place], [place] [address]   Urine Culture     Status: Abnormal   Collection Time: 09/19/23  9:47 PM   Specimen:  Urine, Clean Catch  Result Value Ref Range Status   Specimen Description URINE, CLEAN CATCH  Final   Special Requests   Final    NONE Performed at [facility], [address], [place], [place] [address]    Culture 20,000 COLONIES/mL KLEBSIELLA PNEUMONIAE (A)  Final   Report Status 09/21/2023 FINAL  Final   Organism ID, Bacteria KLEBSIELLA PNEUMONIAE (A)  Final      Susceptibility   Klebsiella pneumoniae - MIC*    AMPICILLIN >=32 RESISTANT Resistant     CEFAZOLIN  (URINE) Value in next row Sensitive      2 SENSITIVEThis is a modified FDA-approved test that has been validated and its performance characteristics determined by the reporting laboratory.  This laboratory is certified under the Clinical Laboratory Improvement Amendments CLIA as qualified to perform high complexity clinical laboratory testing.    CEFEPIME Value in next row Sensitive      2 SENSITIVEThis is a modified FDA-approved test that has been validated and its performance characteristics determined by the reporting laboratory.  This laboratory is certified under the Clinical Laboratory Improvement Amendments CLIA as qualified to perform high complexity clinical laboratory testing.    ERTAPENEM Value in next row Sensitive      2 SENSITIVEThis is a modified FDA-approved test that has been validated and its performance characteristics determined by the reporting laboratory.  This laboratory is certified under the Clinical Laboratory Improvement Amendments CLIA as qualified to perform high complexity clinical laboratory testing.    CEFTRIAXONE  Value in next row Sensitive      2 SENSITIVEThis is a modified FDA-approved test that has been validated and its performance characteristics determined by the reporting laboratory.  This laboratory is certified under the Clinical Laboratory Improvement Amendments CLIA as qualified to perform high complexity clinical laboratory testing.    CIPROFLOXACIN Value in next row Sensitive      2  SENSITIVEThis is a modified FDA-approved test that has been validated and its performance characteristics determined by the reporting laboratory.  This laboratory is certified under the Clinical Laboratory Improvement Amendments CLIA as qualified to perform high complexity clinical laboratory testing.    GENTAMICIN Value in next row Sensitive      2 SENSITIVEThis is a modified FDA-approved test that has been validated and its performance characteristics determined by the reporting laboratory.  This laboratory is certified under the Clinical Laboratory Improvement Amendments CLIA as qualified to perform high complexity clinical laboratory testing.    NITROFURANTOIN Value in next row Resistant      2 SENSITIVEThis is a modified FDA-approved test that has been validated and its performance characteristics determined by the reporting laboratory.  This laboratory is certified under the Clinical Laboratory Improvement Amendments CLIA as qualified to perform high complexity clinical laboratory testing.    TRIMETH/SULFA Value in next row Sensitive      2 SENSITIVEThis is  a modified FDA-approved test that has been validated and its performance characteristics determined by the reporting laboratory.  This laboratory is certified under the Clinical Laboratory Improvement Amendments CLIA as qualified to perform high complexity clinical laboratory testing.    AMPICILLIN/SULBACTAM Value in next row Sensitive      2 SENSITIVEThis is a modified FDA-approved test that has been validated and its performance characteristics determined by the reporting laboratory.  This laboratory is certified under the Clinical Laboratory Improvement Amendments CLIA as qualified to perform high complexity clinical laboratory testing.    PIP/TAZO Value in next row Sensitive ug/mL     <=4 SENSITIVEThis is a modified FDA-approved test that has been validated and its performance characteristics determined by the reporting laboratory.  This  laboratory is certified under the Clinical Laboratory Improvement Amendments CLIA as qualified to perform high complexity clinical laboratory testing.    MEROPENEM Value in next row Sensitive      <=4 SENSITIVEThis is a modified FDA-approved test that has been validated and its performance characteristics determined by the reporting laboratory.  This laboratory is certified under the Clinical Laboratory Improvement Amendments CLIA as qualified to perform high complexity clinical laboratory testing.    * 20,000 COLONIES/mL KLEBSIELLA PNEUMONIAE  Respiratory (~20 pathogens) panel by PCR     Status: None   Collection Time: 09/20/23  2:08 AM   Specimen: Nasopharyngeal Swab; Respiratory  Result Value Ref Range Status   Adenovirus NOT DETECTED NOT DETECTED Final   Coronavirus 229E NOT DETECTED NOT DETECTED Final    Comment: (NOTE) The Coronavirus on the Respiratory Panel, DOES NOT test for the novel  Coronavirus (2019 nCoV)    Coronavirus HKU1 NOT DETECTED NOT DETECTED Final   Coronavirus NL63 NOT DETECTED NOT DETECTED Final   Coronavirus OC43 NOT DETECTED NOT DETECTED Final   Metapneumovirus NOT DETECTED NOT DETECTED Final   Rhinovirus / Enterovirus NOT DETECTED NOT DETECTED Final   Influenza A NOT DETECTED NOT DETECTED Final   Influenza B NOT DETECTED NOT DETECTED Final   Parainfluenza Virus 1 NOT DETECTED NOT DETECTED Final   Parainfluenza Virus 2 NOT DETECTED NOT DETECTED Final   Parainfluenza Virus 3 NOT DETECTED NOT DETECTED Final   Parainfluenza Virus 4 NOT DETECTED NOT DETECTED Final   Respiratory Syncytial Virus NOT DETECTED NOT DETECTED Final   Bordetella pertussis NOT DETECTED NOT DETECTED Final   Bordetella Parapertussis NOT DETECTED NOT DETECTED Final   Chlamydophila pneumoniae NOT DETECTED NOT DETECTED Final   Mycoplasma pneumoniae NOT DETECTED NOT DETECTED Final    Comment: Performed at [facility], [address], [place], [place] [address]     Labs:  CBC: Recent  Labs  Lab 09/19/23 1836 09/19/23 1845 09/19/23 1914 09/20/23 0435 09/21/23 0813 09/22/23 0551 09/23/23 0506  WBC 8.4  --   --  7.3 7.5 8.1 8.1  NEUTROABS 6.8  --   --   --   --   --   --   HGB 6.6*   < > 7.5* 7.5* 8.5* 8.6* 8.5*  HCT 24.9*   < > 22.0* 26.7* 30.2* 30.1* 30.1*  MCV 73.2*  --   --  72.8* 72.2* 72.5* 72.5*  PLT 460*  --   --  328 452* 448* 479*   < > = values in this interval not displayed.   BMP &GFR Recent Labs  Lab 09/19/23 1828 09/19/23 1845 09/19/23 1914 09/20/23 0435 09/21/23 0813 09/22/23 0551 09/23/23 0506  NA 141 141 142 143 139  138 140  K 3.7 4.1 3.7 3.8 3.5 3.5 3.5  CL 107 109  --  106 104 105 108  CO2 23  --   --  22 24 22 22   GLUCOSE 114* 101*  --  137* 132* 121* 130*  BUN 10 10  --  10 5* <5* <5*  CREATININE 0.87 0.80  --  0.80 0.59 0.54 0.60  CALCIUM  8.7*  --   --  8.5* 8.9 8.9 9.0  MG  --   --   --   --  1.6* 1.8 1.7  PHOS  --   --   --   --  2.7 2.7 2.9   Estimated Creatinine Clearance: 62.5 mL/min (by C-G formula based on SCr of 0.6 mg/dL). Liver & Pancreas: Recent Labs  Lab 09/19/23 1828 09/21/23 0813 09/22/23 0551 09/23/23 0506  AST 20  --   --   --   ALT 28  --   --   --   ALKPHOS 138*  --   --   --   BILITOT 0.7  --   --   --   PROT 6.6  --   --   --   ALBUMIN 2.8* 2.7* 2.8* 2.6*   Recent Labs  Lab 09/19/23 1836  LIPASE 22   Recent Labs  Lab 09/19/23 1902  AMMONIA <13   Diabetic: No results for input(s): HGBA1C in the last 72 hours. Recent Labs  Lab 09/22/23 0735 09/22/23 1147 09/22/23 1737 09/23/23 0028 09/23/23 0854  GLUCAP 127* 244* 210* 149* 136*   Cardiac Enzymes: Recent Labs  Lab 09/19/23 1828  CKTOTAL 31*   No results for input(s): PROBNP in the last 8760 hours. Coagulation Profile: No results for input(s): INR, PROTIME in the last 168 hours. Thyroid Function Tests: No results for input(s): TSH, T4TOTAL, FREET4, T3FREE, THYROIDAB in the last 72 hours. Lipid Profile: No  results for input(s): CHOL, HDL, LDLCALC, TRIG, CHOLHDL, LDLDIRECT in the last 72 hours. Anemia Panel: No results for input(s): VITAMINB12, FOLATE, FERRITIN, TIBC, IRON , RETICCTPCT in the last 72 hours. Urine analysis:    Component Value Date/Time   COLORURINE YELLOW 09/19/2023 2004   APPEARANCEUR HAZY (A) 09/19/2023 2004   LABSPEC 1.027 09/19/2023 2004   PHURINE 5.0 09/19/2023 2004   GLUCOSEU NEGATIVE 09/19/2023 2004   HGBUR NEGATIVE 09/19/2023 2004   BILIRUBINUR NEGATIVE 09/19/2023 2004   KETONESUR NEGATIVE 09/19/2023 2004   PROTEINUR NEGATIVE 09/19/2023 2004   NITRITE POSITIVE (A) 09/19/2023 2004   LEUKOCYTESUR SMALL (A) 09/19/2023 2004   Sepsis Labs: Invalid input(s): PROCALCITONIN, LACTICIDVEN   SIGNED:  Tarick Parenteau T Ranika Mcniel, MD  [facility] 09/23/2023, 10:07 AM

## 2023-09-23 NOTE — Progress Notes (Signed)
 [facility] night cross cover note:   I was notified by the patient's RN that the patient is reporting some left elbow tenderness associated with some swelling and erythema.  I subsequently ordered plain films of the left elbow as well as prn IV fentanyl  to help address her associated discomfort.     Eva Pore, DO Hospitalist

## 2023-09-23 NOTE — TOC Progression Note (Signed)
 Transition of Care [facility]) - Progression Note    Patient Details  Name: Allison Hughes MRN: [id number] Date of Birth: 07/02/1950  Transition [facility]) CM/SW Contact  Lendia Dais, [place] Phone Number: 09/23/2023, 2:34 PM  Clinical Narrative: CSW spoke to pt and s.o. Phillip at bedside. Aleene stated that they would take the bed offer at [facility] and the pt was agreeable.   CSW spoke to [place] and stated that they will start insurance auth. Insurance shara is pending.  CSW will continue to monitor.    Expected Discharge Plan: Skilled Nursing Facility Barriers to Discharge: Continued Medical Work up               Expected Discharge Plan and Services In-house Referral: Clinical Social Work     Living arrangements for the past 2 months: Single Family Home Expected Discharge Date: 09/23/23                                     Social Drivers of Health (SDOH) Interventions SDOH Screenings   Food Insecurity: Patient Unable To Answer (09/21/2023)  Housing: Unknown (09/21/2023)  Transportation Needs: Patient Unable To Answer (09/21/2023)  Utilities: Patient Unable To Answer (09/21/2023)  Depression (PHQ2-9): Low Risk  (10/26/2020)  Social Connections: Patient Unable To Answer (09/21/2023)  Tobacco Use: High Risk (09/19/2023)    Readmission Risk Interventions     No data to display

## 2023-09-23 NOTE — Care Management Important Message (Signed)
 Important Message  Patient Details  Name: Allison Hughes MRN: [id number] Date of Birth: 1950/12/03   Important Message Given:  Yes - Medicare IM     [name] 09/23/2023, 3:30 PM

## 2023-09-23 NOTE — Progress Notes (Signed)
   09/23/23 1500  Spiritual Encounters  Type of Visit Initial  Care provided to: Patient  Referral source Nurse (RN/NT/LPN)  Reason for visit Advance directives   Chaplain responded to consult request for HCPOA. She stated that she is not interested to fill it out right now, but wanted to read and discuss with her friends who were present in the room. Chaplain left the document with them. Chaplain remains available if needed.   Allison Hughes Resident [phone]

## 2023-09-23 NOTE — Progress Notes (Signed)
 Physical Therapy Treatment Patient Details Name: Allison Hughes MRN: [id number] DOB: 12-07-1950 Today's Date: 09/23/2023   History of Present Illness Pt is a 73 y.o female admitted 9/5 found on the floor hypoxic and confused. CT showed concerns for pneumonia, no acute changes in brain. 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 PMHx: DMII, HTN and DLD, CVA R parietal, aphasia, R corona radiata, and L cerebellar regions, COPD    PT Comments  Pt required min assist bed mobility, min assist transfers, and min assist amb 5' with RW. Fair sitting balance and poor standing balance. Pt required 2L O2 to maintain SpO2 in 90s at rest. Pt in recliner with feet elevated at end of session.     If plan is discharge home, recommend the following: A lot of help with walking and/or transfers;A lot of help with bathing/dressing/bathroom;Assistance with cooking/housework;Supervision due to cognitive status;Assist for transportation;Help with stairs or ramp for entrance   Can travel by private vehicle     Yes  Equipment Recommendations  None recommended by PT    Recommendations for Other Services       Precautions / Restrictions Precautions Precautions: Fall;Other (comment) Recall of Precautions/Restrictions: Impaired Precaution/Restrictions Comments: watch sats     Mobility  Bed Mobility Overal bed mobility: Needs Assistance Bed Mobility: Supine to Sit     Supine to sit: Min assist, HOB elevated, Used rails          Transfers Overall transfer level: Needs assistance Equipment used: Rolling walker (2 wheels) Transfers: Sit to/from Stand Sit to Stand: Min assist           General transfer comment: cues for hand placement and sequencing    Ambulation/Gait Ambulation/Gait assistance: Min assist Gait Distance (Feet): 5 Feet Assistive device: Rolling walker (2 wheels) Gait Pattern/deviations: Step-through pattern, Decreased stride length, Knees  buckling Gait velocity: decreased     General Gait Details: distance limited by weakness, knee buckling. SpO2 in upper 80s on 1L. Required increase to 2L to improve to 94%. Max HR 104.   Stairs             Wheelchair Mobility     Tilt Bed    Modified Rankin (Stroke Patients Only)       Balance Overall balance assessment: Needs assistance Sitting-balance support: No upper extremity supported, Feet supported Sitting balance-Leahy Scale: Fair     Standing balance support: Bilateral upper extremity supported, During functional activity, Reliant on assistive device for balance Standing balance-Leahy Scale: Poor                              Communication Communication Communication: Impaired Factors Affecting Communication: Hearing impaired  Cognition Arousal: Alert Behavior During Therapy: WFL for tasks assessed/performed   PT - Cognitive impairments: No family/caregiver present to determine baseline, Problem solving, Safety/Judgement, Awareness, Memory, Sequencing                         Following commands: Impaired Following commands impaired: Follows one step commands with increased time    Cueing Cueing Techniques: Verbal cues, Gestural cues, Tactile cues  Exercises      General Comments General comments (skin integrity, edema, etc.): RN attempting to wean O2. SpO2 in upper 80s on 1L required return to 2L to improve to 94%.      Pertinent Vitals/Pain Pain Assessment Pain Assessment: Faces Faces Pain Scale: Hurts a little bit Pain  Location: L elbow Pain Descriptors / Indicators: Discomfort Pain Intervention(s): Monitored during session    Home Living                          Prior Function            PT Goals (current goals can now be found in the care plan section) Acute Rehab PT Goals Patient Stated Goal: get stronger Progress towards PT goals: Progressing toward goals    Frequency    Min 2X/week       PT Plan      Co-evaluation              AM-PAC PT 6 Clicks Mobility   Outcome Measure  Help needed turning from your back to your side while in a flat bed without using bedrails?: A Little Help needed moving from lying on your back to sitting on the side of a flat bed without using bedrails?: A Little Help needed moving to and from a bed to a chair (including a wheelchair)?: A Little Help needed standing up from a chair using your arms (e.g., wheelchair or bedside chair)?: A Little Help needed to walk in hospital room?: A Lot Help needed climbing 3-5 steps with a railing? : Total 6 Click Score: 15    End of Session Equipment Utilized During Treatment: Gait belt;Oxygen Activity Tolerance: Patient tolerated treatment well Patient left: in chair;with call bell/phone within reach;with chair alarm set Nurse Communication: Mobility status PT Visit Diagnosis: Repeated falls (R29.6);Muscle weakness (generalized) (M62.81)     Time: 1040-1104 PT Time Calculation (min) (ACUTE ONLY): 24 min  Charges:    $Gait Training: 8-22 mins $Therapeutic Activity: 8-22 mins PT General Charges $$ ACUTE PT VISIT: 1 Visit                     Sari MATSU., PT  Office # [phone]    Erven Sari Shaker 09/23/2023, 11:16 AM

## 2023-09-24 LAB — GLUCOSE, CAPILLARY
Glucose-Capillary: 111 mg/dL — ABNORMAL HIGH (ref 70–99)
Glucose-Capillary: 111 mg/dL — ABNORMAL HIGH (ref 70–99)
Glucose-Capillary: 178 mg/dL — ABNORMAL HIGH (ref 70–99)

## 2023-09-24 MED ORDER — PNEUMOCOCCAL 20-VAL CONJ VACC 0.5 ML IM SUSY
0.5000 mL | PREFILLED_SYRINGE | Freq: Once | INTRAMUSCULAR | Status: DC
  Filled 2023-09-24: qty 0.5

## 2023-09-24 MED ORDER — PNEUMOCOCCAL 20-VAL CONJ VACC 0.5 ML IM SUSY
0.5000 mL | PREFILLED_SYRINGE | INTRAMUSCULAR | Status: DC
Start: 2023-09-25 — End: 2023-09-24

## 2023-09-24 NOTE — Progress Notes (Signed)
 Report called to [facility] RN @ [employer].  All questions addressed and answered.  Patient and patient's friend made aware.  PIV removed.  Manus is to bring patient's glasses, cell phone, and clothing from home to the SNF.  Patient has her upper and lower dentures, necklace, and rings with her.  [name] RN

## 2023-09-24 NOTE — Discharge Summary (Addendum)
 Physician Discharge Summary  Kelleigh Skerritt FMW:[id number] DOB: 07/06/1950 DOA: 09/19/2023  PCP: Valma Carwin, MD  Admit date: 09/19/2023 Discharge date: 09/24/2023   PATIENT REMAINS MEDICALLY STABLE. FULL DC SUMMARY DICTATED AND COMPLETED BY DR. KATHRIN ON 09/23/23. NO FURTHER CHANGES.    Allergies as of 09/24/2023       Reactions   Iodine Swelling   Shrimp [shellfish Allergy] Swelling   Fresh shrimp        Medication List     STOP taking these medications    aspirin  EC 81 MG tablet   clopidogrel  75 MG tablet Commonly known as: PLAVIX    insulin  lispro 100 UNIT/ML injection Commonly known as: HUMALOG   omeprazole 40 MG capsule Commonly known as: PRILOSEC   Ozempic (2 MG/DOSE) 8 MG/3ML Sopn Generic drug: Semaglutide (2 MG/DOSE)   QUEtiapine  25 MG tablet Commonly known as: SEROQUEL        TAKE these medications    acetaminophen  500 MG tablet Commonly known as: TYLENOL  Take 2 tablets (1,000 mg total) by mouth every 8 (eight) hours as needed for mild pain (pain score 1-3) or headache.   albuterol  108 (90 Base) MCG/ACT inhaler Commonly known as: VENTOLIN  HFA Inhale 2 puffs into the lungs every 6 (six) hours as needed for wheezing or shortness of breath.   ALPRAZolam  0.25 MG tablet Commonly known as: XANAX  Take 1 tablet (0.25 mg total) by mouth 2 (two) times daily as needed for anxiety. What changed:  medication strength how much to take reasons to take this   atorvastatin  40 MG tablet Commonly known as: LIPITOR Take 40 mg by mouth daily.   Breztri  Aerosphere 160-9-4.8 MCG/ACT Aero inhaler Generic drug: budesonide -glycopyrrolate -formoterol  Inhale 2 puffs into the lungs 2 (two) times daily.   lisinopril  20 MG tablet Commonly known as: ZESTRIL  Take 20 mg by mouth at bedtime.   metFORMIN  500 MG 24 hr tablet Commonly known as: GLUCOPHAGE -XR Take 500 mg by mouth at bedtime.   pantoprazole  40 MG tablet Commonly known as: PROTONIX  Take 1 tablet (40 mg total)  by mouth 2 (two) times daily for 30 days, THEN 1 tablet (40 mg total) daily. Start taking on: September 23, 2023   Tresiba  100 UNIT/ML Soln Generic drug: Insulin  Degludec Inject 15 Units into the skin at bedtime. What changed: how much to take         You were cared for by a hospitalist during your hospital stay. If you have any questions about your discharge medications or the care you received while you were in the hospital after you are discharged, you can call the unit and asked to speak with the hospitalist on call if the hospitalist that took care of you is not available. Once you are discharged, your primary care physician will handle any further medical issues. Please note that no refills for any discharge medications will be authorized once you are discharged, as it is imperative that you return to your primary care physician (or establish a relationship with a primary care physician if you do not have one) for your aftercare needs so that they can reassess your need for medications and monitor your lab values.  You were cared for by a hospitalist during your hospital stay. If you have any questions about your discharge medications or the care you received while you were in the hospital after you are discharged, you can call the unit and asked to speak with the hospitalist on call if the hospitalist that took care of you is  not available. Once you are discharged, your primary care physician will handle any further medical issues. Please note that NO REFILLS for any discharge medications will be authorized once you are discharged, as it is imperative that you return to your primary care physician (or establish a relationship with a primary care physician if you do not have one) for your aftercare needs so that they can reassess your need for medications and monitor your lab values.  Please request your Prim.MD to go over all Hospital Tests and Procedure/Radiological results at the follow up,  please get all Hospital records sent to your Prim MD by signing hospital release before you go home.  Get CBC, CMP, 2 view Chest X ray checked  by Primary MD during your next visit or SNF MD in 5-7 days ( we routinely change or add medications that can affect your baseline labs and fluid status, therefore we recommend that you get the mentioned basic workup next visit with your PCP, your PCP may decide not to get them or add new tests based on their clinical decision)  On your next visit with your primary care physician please Get Medicines reviewed and adjusted.  If you experience worsening of your admission symptoms, develop shortness of breath, life threatening emergency, suicidal or homicidal thoughts you must seek medical attention immediately by calling 911 or calling your MD immediately  if symptoms less severe.  You Must read complete instructions/literature along with all the possible adverse reactions/side effects for all the Medicines you take and that have been prescribed to you. Take any new Medicines after you have completely understood and accpet all the possible adverse reactions/side effects.   Do not drive, operate heavy machinery, perform activities at heights, swimming or participation in water activities or provide baby sitting services if your were admitted for syncope or siezures until you have seen by Primary MD or a Neurologist and advised to do so again.  Do not drive when taking Pain medications.   Procedures/Studies: DG Elbow 2 Views Left Result Date: 09/23/2023 CLINICAL DATA:  Left elbow pain, swelling, and redness EXAM: LEFT ELBOW - 2 VIEW COMPARISON:  None Available. FINDINGS: No acute fracture or dislocation. Trace elbow joint effusion. There is no evidence of arthropathy or other focal bone abnormality. Diffuse subcutaneous reticulation of overlying soft tissues. IMPRESSION: 1. No acute fracture or dislocation. 2. Trace elbow joint effusion. 3. Diffuse subcutaneous  reticulation of overlying soft tissues, which may represent cellulitis. Electronically Signed   By: Limin  Xu M.D.   On: 09/23/2023 10:04   CT Angio Chest PE W and/or Wo Contrast Result Date: 09/19/2023 EXAM: CTA CHEST PE WITH/WITHOUT AND WITH CONTRAST CT ABDOMEN AND PELVIS WITH/WITHOUT AND WITH CONTRAST 09/19/2023 07:32:11 PM TECHNIQUE: CTA of the chest was performed after the administration of intravenous contrast (iohexol  (OMNIPAQUE ) 350 MG/ML injection 75 mL). Multiplanar reformatted images are provided for review. MIP images are provided for review. CT of the abdomen and pelvis was performed with the administration of intravenous contrast. Automated exposure control, iterative reconstruction, and/or weight based adjustment of the mA/kV was utilized to reduce the radiation dose to as low as reasonably achievable. COMPARISON: Comparison with same day chest radiograph; CT chest pelvis 13/20/25. CLINICAL HISTORY: Pulmonary embolism (PE) suspected, high prob. Pt AMS, unable to follow breathing instructions at this time. Abdominal pain. Shortness of breath. FINDINGS: CHEST: PULMONARY ARTERIES: Pulmonary arteries are adequately opacified for evaluation. No intraluminal filling defect to suggest pulmonary embolism. MEDIASTINUM: New mediastinal and hilar lymphadenopathy.  For example, a AP window node on series 6 image 122 measuring 1.3 cm and a 1.3 cm right hilar node on series 6 image 159 measuring 1.3 cm. The heart and pericardium demonstrate no acute abnormality. There is no acute abnormality of the thoracic aorta. Coronary artery atherosclerotic calcifications are present. LUNGS AND PLEURA: Diffuse bilateral ground-glass opacities with interlobular septal thickening and small bilateral pleural effusions most suggestive of congestive heart failure. Atelectasis in the lower lobes. No pneumothorax. SOFT TISSUES AND BONES: No acute bone or soft tissue abnormality. ABDOMEN AND PELVIS: LIVER: Hepatic steatosis.  GALLBLADDER AND BILE DUCTS: Cholelithiasis. No evidence of acute cholecystitis. No biliary ductal dilatation. SPLEEN: Spleen demonstrates no acute abnormality. PANCREAS: Pancreas demonstrates no acute abnormality. 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 No follow-up recommended. Adrenal glands are otherwise unremarkable. KIDNEYS, URETERS AND BLADDER: No stones in the kidneys or ureters. No hydronephrosis. No perinephric or periureteral stranding. Urinary bladder is unremarkable. GI AND BOWEL: Stomach and duodenal sweep demonstrate no acute abnormality. There is no bowel obstruction. No abnormal bowel wall thickening or distension. REPRODUCTIVE: Reproductive organs are unremarkable. PERITONEUM AND RETROPERITONEUM: No ascites or free air. LYMPH NODES: No lymphadenopathy in the abdomen or pelvis . BONES AND SOFT TISSUES: No acute abnormality of the visualized bones. No focal soft tissue abnormality. IMPRESSION: 1. No pulmonary embolism. 2. Diffuse bilateral ground-glass opacities with interlobular septal thickening and small bilateral pleural effusions, most suggestive of congestive heart failure. Atypical pneumonia could appear similarly. 3. New mediastinal and hilar lymphadenopathy, likely reactive. Continue observation on follow-up . 4. Hepatic steatosis. 5. Cholelithiasis without evidence of acute cholecystitis. Electronically signed by: Norman Gatlin MD 09/19/2023 07:57 PM EDT RP Workstation: [id number]   CT ABDOMEN PELVIS W CONTRAST Result Date: 09/19/2023 EXAM: CTA CHEST PE WITH/WITHOUT AND WITH CONTRAST CT ABDOMEN AND PELVIS WITH/WITHOUT AND WITH CONTRAST 09/19/2023 07:32:11 PM TECHNIQUE: CTA of the chest was performed after the administration of intravenous contrast (iohexol  (OMNIPAQUE ) 350 MG/ML injection 75 mL). Multiplanar reformatted images are provided for review. MIP images are provided for review. CT of the abdomen and pelvis was performed with the administration of intravenous  contrast. Automated exposure control, iterative reconstruction, and/or weight based adjustment of the mA/kV was utilized to reduce the radiation dose to as low as reasonably achievable. COMPARISON: Comparison with same day chest radiograph; CT chest pelvis 13/20/25. CLINICAL HISTORY: Pulmonary embolism (PE) suspected, high prob. Pt AMS, unable to follow breathing instructions at this time. Abdominal pain. Shortness of breath. FINDINGS: CHEST: PULMONARY ARTERIES: Pulmonary arteries are adequately opacified for evaluation. No intraluminal filling defect to suggest pulmonary embolism. MEDIASTINUM: New mediastinal and hilar lymphadenopathy. For example, a AP window node on series 6 image 122 measuring 1.3 cm and a 1.3 cm right hilar node on series 6 image 159 measuring 1.3 cm. The heart and pericardium demonstrate no acute abnormality. There is no acute abnormality of the thoracic aorta. Coronary artery atherosclerotic calcifications are present. LUNGS AND PLEURA: Diffuse bilateral ground-glass opacities with interlobular septal thickening and small bilateral pleural effusions most suggestive of congestive heart failure. Atelectasis in the lower lobes. No pneumothorax. SOFT TISSUES AND BONES: No acute bone or soft tissue abnormality. ABDOMEN AND PELVIS: LIVER: Hepatic steatosis. GALLBLADDER AND BILE DUCTS: Cholelithiasis. No evidence of acute cholecystitis. No biliary ductal dilatation. SPLEEN: Spleen demonstrates no acute abnormality. PANCREAS: Pancreas demonstrates no acute abnormality. 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 No follow-up recommended. Adrenal glands are otherwise unremarkable. KIDNEYS, URETERS AND BLADDER: No stones  in the kidneys or ureters. No hydronephrosis. No perinephric or periureteral stranding. Urinary bladder is unremarkable. GI AND BOWEL: Stomach and duodenal sweep demonstrate no acute abnormality. There is no bowel obstruction. No abnormal bowel wall thickening or  distension. REPRODUCTIVE: Reproductive organs are unremarkable. PERITONEUM AND RETROPERITONEUM: No ascites or free air. LYMPH NODES: No lymphadenopathy in the abdomen or pelvis . BONES AND SOFT TISSUES: No acute abnormality of the visualized bones. No focal soft tissue abnormality. IMPRESSION: 1. No pulmonary embolism. 2. Diffuse bilateral ground-glass opacities with interlobular septal thickening and small bilateral pleural effusions, most suggestive of congestive heart failure. Atypical pneumonia could appear similarly. 3. New mediastinal and hilar lymphadenopathy, likely reactive. Continue observation on follow-up . 4. Hepatic steatosis. 5. Cholelithiasis without evidence of acute cholecystitis. Electronically signed by: Norman Gatlin MD 09/19/2023 07:57 PM EDT RP Workstation: [id number]   CT Head Wo Contrast Result Date: 09/19/2023 CLINICAL DATA:  Delirium EXAM: CT HEAD WITHOUT CONTRAST TECHNIQUE: Contiguous axial images were obtained from the base of the skull through the vertex without intravenous contrast. RADIATION DOSE REDUCTION: This exam was performed according to the departmental dose-optimization program which includes automated exposure control, adjustment of the mA and/or kV according to patient size and/or use of iterative reconstruction technique. COMPARISON:  MRI head Jun 04, 2022.  CT head 08/01/2023. FINDINGS: Brain: Similar remote infarct in the right parietal lobe with a severe patchy and confluent white matter hypoattenuation, compatible with chronic microvascular ischemic change. Additional remote infarct in the left cerebellum. Chronic basal ganglia calcifications. No evidence of acute large vascular territory infarct, acute hemorrhage, mass lesion or midline shift. Vascular: Calcific atherosclerosis.  No hyperdense vessel. Skull: No acute fracture. Sinuses/Orbits: Clear sinuses.  No acute orbital findings. Other: No mastoid effusions. IMPRESSION: 1. No evidence of acute intracranial  abnormality. 2. 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 Electronically Signed   By: Gilmore GORMAN Molt M.D.   On: 09/19/2023 19:44   DG Chest Portable 1 View Result Date: 09/19/2023 CLINICAL DATA:  Short of breath EXAM: PORTABLE CHEST 1 VIEW COMPARISON:  08/28/2023 FINDINGS: Single frontal view of the chest demonstrates a stable cardiac silhouette. There is increased pulmonary vascular congestion, with basilar predominant interstitial and airspace opacities compatible with edema. No large effusion or pneumothorax. No acute bony abnormalities. IMPRESSION: 1. Findings most consistent with congestive heart failure and mild pulmonary edema. Electronically Signed   By: Ozell Daring M.D.   On: 09/19/2023 19:40   CUP PACEART REMOTE DEVICE CHECK Result Date: 09/18/2023 ILR summary report received. Battery status OK. Normal device function. No new symptom, tachy, brady, or pause episodes. No new AF episodes. Monthly summary reports and ROV/PRN - CS, CVRS    The results of significant diagnostics from this hospitalization (including imaging, microbiology, ancillary and laboratory) are listed below for reference.     Microbiology: Recent Results (from the past 240 hours)  Resp panel by RT-PCR (RSV, Flu A&B, Covid) Anterior Nasal Swab     Status: None   Collection Time: 09/19/23  6:35 PM   Specimen: Anterior Nasal Swab  Result Value Ref Range Status   SARS Coronavirus 2 by RT PCR NEGATIVE NEGATIVE Final   Influenza A by PCR NEGATIVE NEGATIVE Final   Influenza B by PCR NEGATIVE NEGATIVE Final    Comment: (NOTE) The Xpert Xpress SARS-CoV-2/FLU/RSV plus assay is intended as an aid in the diagnosis of influenza from Nasopharyngeal swab specimens and should not be used as a sole basis for treatment. Nasal washings and  aspirates are unacceptable for Xpert Xpress SARS-CoV-2/FLU/RSV testing.  Fact Sheet for Patients: BloggerCourse.com  Fact Sheet for  Healthcare Providers: SeriousBroker.it  This test is not yet approved or cleared by the [place]  FDA and has been authorized for detection and/or diagnosis of SARS-CoV-2 by FDA under an Emergency Use Authorization (EUA). This EUA will remain in effect (meaning this test can be used) for the duration of the COVID-19 declaration under Section 564(b)(1) of the Act, 21 U.S.C. section 360bbb-3(b)(1), unless the authorization is terminated or revoked.     Resp Syncytial Virus by PCR NEGATIVE NEGATIVE Final    Comment: (NOTE) Fact Sheet for Patients: BloggerCourse.com  Fact Sheet for Healthcare Providers: SeriousBroker.it  This test is not yet approved or cleared by the [place]  FDA and has been authorized for detection and/or diagnosis of SARS-CoV-2 by FDA under an Emergency Use Authorization (EUA). This EUA will remain in effect (meaning this test can be used) for the duration of the COVID-19 declaration under Section 564(b)(1) of the Act, 21 U.S.C. section 360bbb-3(b)(1), unless the authorization is terminated or revoked.  Performed at [facility], [address], [place], [place] [address]   Urine Culture     Status: Abnormal   Collection Time: 09/19/23  9:47 PM   Specimen: Urine, Clean Catch  Result Value Ref Range Status   Specimen Description URINE, CLEAN CATCH  Final   Special Requests   Final    NONE Performed at [facility], [address] [place], [place] [address]    Culture 20,000 COLONIES/mL KLEBSIELLA PNEUMONIAE (A)  Final   Report Status 09/21/2023 FINAL  Final   Organism ID, Bacteria KLEBSIELLA PNEUMONIAE (A)  Final      Susceptibility   Klebsiella pneumoniae - MIC*    AMPICILLIN >=32 RESISTANT Resistant     CEFAZOLIN  (URINE) Value in next row Sensitive      2 SENSITIVEThis is a modified FDA-approved test that has been validated and its performance  characteristics determined by the reporting laboratory.  This laboratory is certified under the Clinical Laboratory Improvement Amendments CLIA as qualified to perform high complexity clinical laboratory testing.    CEFEPIME Value in next row Sensitive      2 SENSITIVEThis is a modified FDA-approved test that has been validated and its performance characteristics determined by the reporting laboratory.  This laboratory is certified under the Clinical Laboratory Improvement Amendments CLIA as qualified to perform high complexity clinical laboratory testing.    ERTAPENEM Value in next row Sensitive      2 SENSITIVEThis is a modified FDA-approved test that has been validated and its performance characteristics determined by the reporting laboratory.  This laboratory is certified under the Clinical Laboratory Improvement Amendments CLIA as qualified to perform high complexity clinical laboratory testing.    CEFTRIAXONE  Value in next row Sensitive      2 SENSITIVEThis is a modified FDA-approved test that has been validated and its performance characteristics determined by the reporting laboratory.  This laboratory is certified under the Clinical Laboratory Improvement Amendments CLIA as qualified to perform high complexity clinical laboratory testing.    CIPROFLOXACIN Value in next row Sensitive      2 SENSITIVEThis is a modified FDA-approved test that has been validated and its performance characteristics determined by the reporting laboratory.  This laboratory is certified under the Clinical Laboratory Improvement Amendments CLIA as qualified to perform high complexity clinical laboratory testing.    GENTAMICIN Value in next row Sensitive  2 SENSITIVEThis is a modified FDA-approved test that has been validated and its performance characteristics determined by the reporting laboratory.  This laboratory is certified under the Clinical Laboratory Improvement Amendments CLIA as qualified to perform high  complexity clinical laboratory testing.    NITROFURANTOIN Value in next row Resistant      2 SENSITIVEThis is a modified FDA-approved test that has been validated and its performance characteristics determined by the reporting laboratory.  This laboratory is certified under the Clinical Laboratory Improvement Amendments CLIA as qualified to perform high complexity clinical laboratory testing.    TRIMETH/SULFA Value in next row Sensitive      2 SENSITIVEThis is a modified FDA-approved test that has been validated and its performance characteristics determined by the reporting laboratory.  This laboratory is certified under the Clinical Laboratory Improvement Amendments CLIA as qualified to perform high complexity clinical laboratory testing.    AMPICILLIN/SULBACTAM Value in next row Sensitive      2 SENSITIVEThis is a modified FDA-approved test that has been validated and its performance characteristics determined by the reporting laboratory.  This laboratory is certified under the Clinical Laboratory Improvement Amendments CLIA as qualified to perform high complexity clinical laboratory testing.    PIP/TAZO Value in next row Sensitive ug/mL     <=4 SENSITIVEThis is a modified FDA-approved test that has been validated and its performance characteristics determined by the reporting laboratory.  This laboratory is certified under the Clinical Laboratory Improvement Amendments CLIA as qualified to perform high complexity clinical laboratory testing.    MEROPENEM Value in next row Sensitive      <=4 SENSITIVEThis is a modified FDA-approved test that has been validated and its performance characteristics determined by the reporting laboratory.  This laboratory is certified under the Clinical Laboratory Improvement Amendments CLIA as qualified to perform high complexity clinical laboratory testing.    * 20,000 COLONIES/mL KLEBSIELLA PNEUMONIAE  Respiratory (~20 pathogens) panel by PCR     Status: None    Collection Time: 09/20/23  2:08 AM   Specimen: Nasopharyngeal Swab; Respiratory  Result Value Ref Range Status   Adenovirus NOT DETECTED NOT DETECTED Final   Coronavirus 229E NOT DETECTED NOT DETECTED Final    Comment: (NOTE) The Coronavirus on the Respiratory Panel, DOES NOT test for the novel  Coronavirus (2019 nCoV)    Coronavirus HKU1 NOT DETECTED NOT DETECTED Final   Coronavirus NL63 NOT DETECTED NOT DETECTED Final   Coronavirus OC43 NOT DETECTED NOT DETECTED Final   Metapneumovirus NOT DETECTED NOT DETECTED Final   Rhinovirus / Enterovirus NOT DETECTED NOT DETECTED Final   Influenza A NOT DETECTED NOT DETECTED Final   Influenza B NOT DETECTED NOT DETECTED Final   Parainfluenza Virus 1 NOT DETECTED NOT DETECTED Final   Parainfluenza Virus 2 NOT DETECTED NOT DETECTED Final   Parainfluenza Virus 3 NOT DETECTED NOT DETECTED Final   Parainfluenza Virus 4 NOT DETECTED NOT DETECTED Final   Respiratory Syncytial Virus NOT DETECTED NOT DETECTED Final   Bordetella pertussis NOT DETECTED NOT DETECTED Final   Bordetella Parapertussis NOT DETECTED NOT DETECTED Final   Chlamydophila pneumoniae NOT DETECTED NOT DETECTED Final   Mycoplasma pneumoniae NOT DETECTED NOT DETECTED Final    Comment: Performed at [facility], [address], [place], [place] [address]     Labs: BNP (last 3 results) Recent Labs    09/19/23 1943  BNP 78.7   Basic Metabolic Panel: Recent Labs  Lab 09/19/23 1828 09/19/23 1845 09/19/23 1914 09/20/23 0435  09/21/23 0813 09/22/23 0551 09/23/23 0506  NA 141 141 142 143 139 138 140  K 3.7 4.1 3.7 3.8 3.5 3.5 3.5  CL 107 109  --  106 104 105 108  CO2 23  --   --  22 24 22 22   GLUCOSE 114* 101*  --  137* 132* 121* 130*  BUN 10 10  --  10 5* <5* <5*  CREATININE 0.87 0.80  --  0.80 0.59 0.54 0.60  CALCIUM  8.7*  --   --  8.5* 8.9 8.9 9.0  MG  --   --   --   --  1.6* 1.8 1.7  PHOS  --   --   --   --  2.7 2.7 2.9   Liver Function Tests: Recent Labs   Lab 09/19/23 1828 09/21/23 0813 09/22/23 0551 09/23/23 0506  AST 20  --   --   --   ALT 28  --   --   --   ALKPHOS 138*  --   --   --   BILITOT 0.7  --   --   --   PROT 6.6  --   --   --   ALBUMIN 2.8* 2.7* 2.8* 2.6*   Recent Labs  Lab 09/19/23 1836  LIPASE 22   Recent Labs  Lab 09/19/23 1902  AMMONIA <13   CBC: Recent Labs  Lab 09/19/23 1836 09/19/23 1845 09/19/23 1914 09/20/23 0435 09/21/23 0813 09/22/23 0551 09/23/23 0506  WBC 8.4  --   --  7.3 7.5 8.1 8.1  NEUTROABS 6.8  --   --   --   --   --   --   HGB 6.6*   < > 7.5* 7.5* 8.5* 8.6* 8.5*  HCT 24.9*   < > 22.0* 26.7* 30.2* 30.1* 30.1*  MCV 73.2*  --   --  72.8* 72.2* 72.5* 72.5*  PLT 460*  --   --  328 452* 448* 479*   < > = values in this interval not displayed.   Cardiac Enzymes: Recent Labs  Lab 09/19/23 1828  CKTOTAL 31*   BNP: Invalid input(s): POCBNP CBG: Recent Labs  Lab 09/23/23 1607 09/23/23 2106 09/24/23 0611 09/24/23 0755 09/24/23 1149  GLUCAP 230* 258* 111* 111* 178*   D-Dimer No results for input(s): DDIMER in the last 72 hours. Hgb A1c No results for input(s): HGBA1C in the last 72 hours. Lipid Profile No results for input(s): CHOL, HDL, LDLCALC, TRIG, CHOLHDL, LDLDIRECT in the last 72 hours. Thyroid function studies No results for input(s): TSH, T4TOTAL, T3FREE, THYROIDAB in the last 72 hours.  Invalid input(s): FREET3 Anemia work up No results for input(s): VITAMINB12, FOLATE, FERRITIN, TIBC, IRON , RETICCTPCT in the last 72 hours. Urinalysis    Component Value Date/Time   COLORURINE YELLOW 09/19/2023 2004   APPEARANCEUR HAZY (A) 09/19/2023 2004   LABSPEC 1.027 09/19/2023 2004   PHURINE 5.0 09/19/2023 2004   GLUCOSEU NEGATIVE 09/19/2023 2004   HGBUR NEGATIVE 09/19/2023 2004   BILIRUBINUR NEGATIVE 09/19/2023 2004   KETONESUR NEGATIVE 09/19/2023 2004   PROTEINUR NEGATIVE 09/19/2023 2004   NITRITE POSITIVE (A) 09/19/2023 2004    LEUKOCYTESUR SMALL (A) 09/19/2023 2004   Sepsis Labs Recent Labs  Lab 09/20/23 0435 09/21/23 0813 09/22/23 0551 09/23/23 0506  WBC 7.3 7.5 8.1 8.1   Microbiology Recent Results (from the past 240 hours)  Resp panel by RT-PCR (RSV, Flu A&B, Covid) Anterior Nasal Swab     Status: None  Collection Time: 09/19/23  6:35 PM   Specimen: Anterior Nasal Swab  Result Value Ref Range Status   SARS Coronavirus 2 by RT PCR NEGATIVE NEGATIVE Final   Influenza A by PCR NEGATIVE NEGATIVE Final   Influenza B by PCR NEGATIVE NEGATIVE Final    Comment: (NOTE) The Xpert Xpress SARS-CoV-2/FLU/RSV plus assay is intended as an aid in the diagnosis of influenza from Nasopharyngeal swab specimens and should not be used as a sole basis for treatment. Nasal washings and aspirates are unacceptable for Xpert Xpress SARS-CoV-2/FLU/RSV testing.  Fact Sheet for Patients: BloggerCourse.com  Fact Sheet for Healthcare Providers: SeriousBroker.it  This test is not yet approved or cleared by the [place]  FDA and has been authorized for detection and/or diagnosis of SARS-CoV-2 by FDA under an Emergency Use Authorization (EUA). This EUA will remain in effect (meaning this test can be used) for the duration of the COVID-19 declaration under Section 564(b)(1) of the Act, 21 U.S.C. section 360bbb-3(b)(1), unless the authorization is terminated or revoked.     Resp Syncytial Virus by PCR NEGATIVE NEGATIVE Final    Comment: (NOTE) Fact Sheet for Patients: BloggerCourse.com  Fact Sheet for Healthcare Providers: SeriousBroker.it  This test is not yet approved or cleared by the [place]  FDA and has been authorized for detection and/or diagnosis of SARS-CoV-2 by FDA under an Emergency Use Authorization (EUA). This EUA will remain in effect (meaning this test can be used) for the duration of  the COVID-19 declaration under Section 564(b)(1) of the Act, 21 U.S.C. section 360bbb-3(b)(1), unless the authorization is terminated or revoked.  Performed at [facility], [address] [place], [place] [address]   Urine Culture     Status: Abnormal   Collection Time: 09/19/23  9:47 PM   Specimen: Urine, Clean Catch  Result Value Ref Range Status   Specimen Description URINE, CLEAN CATCH  Final   Special Requests   Final    NONE Performed at [facility], [address], [place], [place] [address]    Culture 20,000 COLONIES/mL KLEBSIELLA PNEUMONIAE (A)  Final   Report Status 09/21/2023 FINAL  Final   Organism ID, Bacteria KLEBSIELLA PNEUMONIAE (A)  Final      Susceptibility   Klebsiella pneumoniae - MIC*    AMPICILLIN >=32 RESISTANT Resistant     CEFAZOLIN  (URINE) Value in next row Sensitive      2 SENSITIVEThis is a modified FDA-approved test that has been validated and its performance characteristics determined by the reporting laboratory.  This laboratory is certified under the Clinical Laboratory Improvement Amendments CLIA as qualified to perform high complexity clinical laboratory testing.    CEFEPIME Value in next row Sensitive      2 SENSITIVEThis is a modified FDA-approved test that has been validated and its performance characteristics determined by the reporting laboratory.  This laboratory is certified under the Clinical Laboratory Improvement Amendments CLIA as qualified to perform high complexity clinical laboratory testing.    ERTAPENEM Value in next row Sensitive      2 SENSITIVEThis is a modified FDA-approved test that has been validated and its performance characteristics determined by the reporting laboratory.  This laboratory is certified under the Clinical Laboratory Improvement Amendments CLIA as qualified to perform high complexity clinical laboratory testing.    CEFTRIAXONE  Value in next row Sensitive      2 SENSITIVEThis is a modified  FDA-approved test that has been validated and its performance characteristics determined by the reporting laboratory.  This laboratory is certified under the Clinical Laboratory Improvement Amendments CLIA as qualified to perform high complexity clinical laboratory testing.    CIPROFLOXACIN Value in next row Sensitive      2 SENSITIVEThis is a modified FDA-approved test that has been validated and its performance characteristics determined by the reporting laboratory.  This laboratory is certified under the Clinical Laboratory Improvement Amendments CLIA as qualified to perform high complexity clinical laboratory testing.    GENTAMICIN Value in next row Sensitive      2 SENSITIVEThis is a modified FDA-approved test that has been validated and its performance characteristics determined by the reporting laboratory.  This laboratory is certified under the Clinical Laboratory Improvement Amendments CLIA as qualified to perform high complexity clinical laboratory testing.    NITROFURANTOIN Value in next row Resistant      2 SENSITIVEThis is a modified FDA-approved test that has been validated and its performance characteristics determined by the reporting laboratory.  This laboratory is certified under the Clinical Laboratory Improvement Amendments CLIA as qualified to perform high complexity clinical laboratory testing.    TRIMETH/SULFA Value in next row Sensitive      2 SENSITIVEThis is a modified FDA-approved test that has been validated and its performance characteristics determined by the reporting laboratory.  This laboratory is certified under the Clinical Laboratory Improvement Amendments CLIA as qualified to perform high complexity clinical laboratory testing.    AMPICILLIN/SULBACTAM Value in next row Sensitive      2 SENSITIVEThis is a modified FDA-approved test that has been validated and its performance characteristics determined by the reporting laboratory.  This laboratory is certified under the  Clinical Laboratory Improvement Amendments CLIA as qualified to perform high complexity clinical laboratory testing.    PIP/TAZO Value in next row Sensitive ug/mL     <=4 SENSITIVEThis is a modified FDA-approved test that has been validated and its performance characteristics determined by the reporting laboratory.  This laboratory is certified under the Clinical Laboratory Improvement Amendments CLIA as qualified to perform high complexity clinical laboratory testing.    MEROPENEM Value in next row Sensitive      <=4 SENSITIVEThis is a modified FDA-approved test that has been validated and its performance characteristics determined by the reporting laboratory.  This laboratory is certified under the Clinical Laboratory Improvement Amendments CLIA as qualified to perform high complexity clinical laboratory testing.    * 20,000 COLONIES/mL KLEBSIELLA PNEUMONIAE  Respiratory (~20 pathogens) panel by PCR     Status: None   Collection Time: 09/20/23  2:08 AM   Specimen: Nasopharyngeal Swab; Respiratory  Result Value Ref Range Status   Adenovirus NOT DETECTED NOT DETECTED Final   Coronavirus 229E NOT DETECTED NOT DETECTED Final    Comment: (NOTE) The Coronavirus on the Respiratory Panel, DOES NOT test for the novel  Coronavirus (2019 nCoV)    Coronavirus HKU1 NOT DETECTED NOT DETECTED Final   Coronavirus NL63 NOT DETECTED NOT DETECTED Final   Coronavirus OC43 NOT DETECTED NOT DETECTED Final   Metapneumovirus NOT DETECTED NOT DETECTED Final   Rhinovirus / Enterovirus NOT DETECTED NOT DETECTED Final   Influenza A NOT DETECTED NOT DETECTED Final   Influenza B NOT DETECTED NOT DETECTED Final   Parainfluenza Virus 1 NOT DETECTED NOT DETECTED Final   Parainfluenza Virus 2 NOT DETECTED NOT DETECTED Final   Parainfluenza Virus 3 NOT DETECTED NOT DETECTED Final   Parainfluenza Virus 4 NOT DETECTED NOT DETECTED Final   Respiratory Syncytial Virus NOT DETECTED NOT DETECTED Final  Bordetella pertussis  NOT DETECTED NOT DETECTED Final   Bordetella Parapertussis NOT DETECTED NOT DETECTED Final   Chlamydophila pneumoniae NOT DETECTED NOT DETECTED Final   Mycoplasma pneumoniae NOT DETECTED NOT DETECTED Final    Comment: Performed at [facility], [address] [place], [place] [address]     Time coordinating discharge:  I have spent 35 minutes face to face with the patient and on the ward discussing the patients care, assessment, plan and disposition with other care givers. >50% of the time was devoted counseling the patient about the risks and benefits of treatment/Discharge disposition and coordinating care.   SIGNED:   Burgess JAYSON Dare, MD  [facility] 09/24/2023, 1:15 PM   If 7PM-7AM, please contact night-coverage

## 2023-09-24 NOTE — TOC Transition Note (Signed)
 Transition of Care [facility]) - Discharge Note   Patient Details  Name: Allison Hughes MRN: [id number] Date of Birth: 17-Nov-1950  [facility]) CM/SW Contact:  Lendia Dais, LCSWA Phone Number: 09/24/2023, 1:57 PM   Clinical Narrative:   Pt is dc'ing to [facility]. RN report [phone]. PTAR was called at 1354 and will arrive in 1-2 hours.  CSW called and spoke to [place] and informed them of the pt discharging by [facility] to [facility]. CSW informed [facility] that they pt may be billed for the ambulance due to it be recommended by PT that the pt could travel by private vehicle. [facility] stated understanding.  No further TOC Needs.      Barriers to Discharge: Continued Medical Work up   Patient Goals and CMS Choice   CMS Medicare.gov Compare Post Acute Care list provided to:: Patient Choice offered to / list presented to : Patient      Discharge Placement                       Discharge Plan and Services Additional resources added to the After Visit Summary for   In-house Referral: Clinical Social Work                                   Social Drivers of Health (SDOH) Interventions SDOH Screenings   Food Insecurity: Patient Unable To Answer (09/21/2023)  Housing: Unknown (09/21/2023)  Transportation Needs: Patient Unable To Answer (09/21/2023)  Utilities: Patient Unable To Answer (09/21/2023)  Depression (PHQ2-9): Low Risk  (10/26/2020)  Social Connections: Patient Unable To Answer (09/21/2023)  Tobacco Use: High Risk (09/19/2023)     Readmission Risk Interventions     No data to display

## 2023-09-24 NOTE — Progress Notes (Signed)
 Seen at bedside.  Currently awaiting placement No complaints at this time. Remains medically stable for SNF placement.  Burgess Dare MD TRH

## 2023-09-27 NOTE — Progress Notes (Signed)
 Remote Loop Recorder Transmission

## 2023-10-13 NOTE — Progress Notes (Signed)
 Remote Loop Recorder Transmission

## 2023-10-19 ENCOUNTER — Encounter

## 2023-10-20 ENCOUNTER — Observation Stay (HOSPITAL_COMMUNITY)

## 2023-10-20 ENCOUNTER — Emergency Department (HOSPITAL_COMMUNITY)

## 2023-10-20 ENCOUNTER — Encounter

## 2023-10-20 ENCOUNTER — Inpatient Hospital Stay (HOSPITAL_COMMUNITY)
Admission: EM | Admit: 2023-10-20 | Discharge: 2023-11-11 | DRG: 689 | Disposition: A | Discharge status: Skilled Nursing Facility | Attending: Family Medicine | Admitting: Family Medicine

## 2023-10-20 ENCOUNTER — Encounter (HOSPITAL_COMMUNITY): Payer: Self-pay | Admitting: *Deleted

## 2023-10-20 ENCOUNTER — Other Ambulatory Visit: Payer: Self-pay

## 2023-10-20 DIAGNOSIS — F05 Delirium due to known physiological condition: Secondary | ICD-10-CM | POA: Diagnosis not present

## 2023-10-20 DIAGNOSIS — Z91041 Radiographic dye allergy status: Secondary | ICD-10-CM

## 2023-10-20 DIAGNOSIS — Z833 Family history of diabetes mellitus: Secondary | ICD-10-CM

## 2023-10-20 DIAGNOSIS — W19XXXA Unspecified fall, initial encounter: Secondary | ICD-10-CM | POA: Diagnosis present

## 2023-10-20 DIAGNOSIS — N3000 Acute cystitis without hematuria: Secondary | ICD-10-CM | POA: Diagnosis not present

## 2023-10-20 DIAGNOSIS — Z8673 Personal history of transient ischemic attack (TIA), and cerebral infarction without residual deficits: Secondary | ICD-10-CM

## 2023-10-20 DIAGNOSIS — Z79899 Other long term (current) drug therapy: Secondary | ICD-10-CM

## 2023-10-20 DIAGNOSIS — M25572 Pain in left ankle and joints of left foot: Secondary | ICD-10-CM | POA: Diagnosis present

## 2023-10-20 DIAGNOSIS — Z91013 Allergy to seafood: Secondary | ICD-10-CM

## 2023-10-20 DIAGNOSIS — R4182 Altered mental status, unspecified: Principal | ICD-10-CM

## 2023-10-20 DIAGNOSIS — E860 Dehydration: Secondary | ICD-10-CM | POA: Diagnosis present

## 2023-10-20 DIAGNOSIS — K219 Gastro-esophageal reflux disease without esophagitis: Secondary | ICD-10-CM | POA: Diagnosis present

## 2023-10-20 DIAGNOSIS — N390 Urinary tract infection, site not specified: Principal | ICD-10-CM | POA: Diagnosis present

## 2023-10-20 DIAGNOSIS — Z6833 Body mass index (BMI) 33.0-33.9, adult: Secondary | ICD-10-CM

## 2023-10-20 DIAGNOSIS — I10 Essential (primary) hypertension: Secondary | ICD-10-CM | POA: Diagnosis present

## 2023-10-20 DIAGNOSIS — I110 Hypertensive heart disease with heart failure: Secondary | ICD-10-CM | POA: Diagnosis present

## 2023-10-20 DIAGNOSIS — I5032 Chronic diastolic (congestive) heart failure: Secondary | ICD-10-CM | POA: Diagnosis not present

## 2023-10-20 DIAGNOSIS — M7989 Other specified soft tissue disorders: Secondary | ICD-10-CM | POA: Diagnosis present

## 2023-10-20 DIAGNOSIS — Z751 Person awaiting admission to adequate facility elsewhere: Secondary | ICD-10-CM

## 2023-10-20 DIAGNOSIS — E1165 Type 2 diabetes mellitus with hyperglycemia: Secondary | ICD-10-CM | POA: Diagnosis present

## 2023-10-20 DIAGNOSIS — Z602 Problems related to living alone: Secondary | ICD-10-CM | POA: Diagnosis present

## 2023-10-20 DIAGNOSIS — Y92009 Unspecified place in unspecified non-institutional (private) residence as the place of occurrence of the external cause: Secondary | ICD-10-CM

## 2023-10-20 DIAGNOSIS — Z9181 History of falling: Secondary | ICD-10-CM

## 2023-10-20 DIAGNOSIS — G4700 Insomnia, unspecified: Secondary | ICD-10-CM | POA: Diagnosis present

## 2023-10-20 DIAGNOSIS — Z8711 Personal history of peptic ulcer disease: Secondary | ICD-10-CM

## 2023-10-20 DIAGNOSIS — U071 COVID-19: Secondary | ICD-10-CM | POA: Diagnosis not present

## 2023-10-20 DIAGNOSIS — E119 Type 2 diabetes mellitus without complications: Secondary | ICD-10-CM

## 2023-10-20 DIAGNOSIS — E66811 Obesity, class 1: Secondary | ICD-10-CM | POA: Diagnosis present

## 2023-10-20 DIAGNOSIS — G9341 Metabolic encephalopathy: Secondary | ICD-10-CM | POA: Diagnosis present

## 2023-10-20 DIAGNOSIS — E785 Hyperlipidemia, unspecified: Secondary | ICD-10-CM | POA: Diagnosis present

## 2023-10-20 DIAGNOSIS — E876 Hypokalemia: Secondary | ICD-10-CM | POA: Diagnosis present

## 2023-10-20 DIAGNOSIS — Z7984 Long term (current) use of oral hypoglycemic drugs: Secondary | ICD-10-CM

## 2023-10-20 DIAGNOSIS — E46 Unspecified protein-calorie malnutrition: Secondary | ICD-10-CM | POA: Diagnosis present

## 2023-10-20 DIAGNOSIS — Z8719 Personal history of other diseases of the digestive system: Secondary | ICD-10-CM

## 2023-10-20 DIAGNOSIS — F17210 Nicotine dependence, cigarettes, uncomplicated: Secondary | ICD-10-CM | POA: Diagnosis present

## 2023-10-20 DIAGNOSIS — J9601 Acute respiratory failure with hypoxia: Secondary | ICD-10-CM | POA: Diagnosis present

## 2023-10-20 DIAGNOSIS — Z515 Encounter for palliative care: Secondary | ICD-10-CM

## 2023-10-20 DIAGNOSIS — M79672 Pain in left foot: Secondary | ICD-10-CM | POA: Diagnosis present

## 2023-10-20 DIAGNOSIS — R296 Repeated falls: Secondary | ICD-10-CM | POA: Diagnosis present

## 2023-10-20 DIAGNOSIS — J449 Chronic obstructive pulmonary disease, unspecified: Secondary | ICD-10-CM | POA: Diagnosis present

## 2023-10-20 DIAGNOSIS — F03918 Unspecified dementia, unspecified severity, with other behavioral disturbance: Secondary | ICD-10-CM | POA: Diagnosis present

## 2023-10-20 DIAGNOSIS — Z794 Long term (current) use of insulin: Secondary | ICD-10-CM

## 2023-10-20 LAB — I-STAT ARTERIAL BLOOD GAS, ED
Acid-Base Excess: 3 mmol/L — ABNORMAL HIGH (ref 0.0–2.0)
Bicarbonate: 29.4 mmol/L — ABNORMAL HIGH (ref 20.0–28.0)
Calcium, Ion: 1.31 mmol/L (ref 1.15–1.40)
HCT: 37 % (ref 36.0–46.0)
Hemoglobin: 12.6 g/dL (ref 12.0–15.0)
O2 Saturation: 86 %
Patient temperature: 97.7
Potassium: 3.1 mmol/L — ABNORMAL LOW (ref 3.5–5.1)
Sodium: 141 mmol/L (ref 135–145)
TCO2: 31 mmol/L (ref 22–32)
pCO2 arterial: 48.4 mmHg — ABNORMAL HIGH (ref 32–48)
pH, Arterial: 7.389 (ref 7.35–7.45)
pO2, Arterial: 51 mmHg — ABNORMAL LOW (ref 83–108)

## 2023-10-20 LAB — URINALYSIS, W/ REFLEX TO CULTURE (INFECTION SUSPECTED)
Bilirubin Urine: NEGATIVE
Glucose, UA: >=500 mg/dL — AB
Ketones, ur: NEGATIVE mg/dL
Nitrite: NEGATIVE
Protein, ur: 100 mg/dL — AB
Specific Gravity, Urine: 1.022 (ref 1.005–1.030)
pH: 5 (ref 5.0–8.0)

## 2023-10-20 LAB — GLUCOSE, CAPILLARY
Glucose-Capillary: 146 mg/dL — ABNORMAL HIGH (ref 70–99)
Glucose-Capillary: 260 mg/dL — ABNORMAL HIGH (ref 70–99)

## 2023-10-20 LAB — CBC WITH DIFFERENTIAL/PLATELET
Basophils Absolute: 0 K/uL (ref 0.0–0.1)
Basophils Relative: 0 %
Eosinophils Absolute: 0.3 K/uL (ref 0.0–0.5)
Eosinophils Relative: 4 %
HCT: 45 % (ref 36.0–46.0)
Hemoglobin: 13.3 g/dL (ref 12.0–15.0)
Lymphocytes Relative: 19 %
Lymphs Abs: 1.3 K/uL (ref 0.7–4.0)
MCH: 22.1 pg — ABNORMAL LOW (ref 26.0–34.0)
MCHC: 29.6 g/dL — ABNORMAL LOW (ref 30.0–36.0)
MCV: 74.9 fL — ABNORMAL LOW (ref 80.0–100.0)
Monocytes Absolute: 0.1 K/uL (ref 0.1–1.0)
Monocytes Relative: 2 %
Neutro Abs: 5 K/uL (ref 1.7–7.7)
Neutrophils Relative %: 75 %
Platelets: 255 K/uL (ref 150–400)
RBC: 6.01 MIL/uL — ABNORMAL HIGH (ref 3.87–5.11)
RDW: 23.5 % — ABNORMAL HIGH (ref 11.5–15.5)
WBC: 6.7 K/uL (ref 4.0–10.5)
nRBC: 0 % (ref 0.0–0.2)

## 2023-10-20 LAB — COMPREHENSIVE METABOLIC PANEL WITH GFR
ALT: 24 U/L (ref 0–44)
AST: 30 U/L (ref 15–41)
Albumin: 2.9 g/dL — ABNORMAL LOW (ref 3.5–5.0)
Alkaline Phosphatase: 112 U/L (ref 38–126)
Anion gap: 13 (ref 5–15)
BUN: 8 mg/dL (ref 8–23)
CO2: 23 mmol/L (ref 22–32)
Calcium: 9.1 mg/dL (ref 8.9–10.3)
Chloride: 103 mmol/L (ref 98–111)
Creatinine, Ser: 0.53 mg/dL (ref 0.44–1.00)
GFR, Estimated: >60 mL/min (ref >60)
Glucose, Bld: 173 mg/dL — ABNORMAL HIGH (ref 70–99)
Potassium: 3.5 mmol/L (ref 3.5–5.1)
Sodium: 139 mmol/L (ref 135–145)
Total Bilirubin: 0.8 mg/dL (ref 0.0–1.2)
Total Protein: 6.3 g/dL — ABNORMAL LOW (ref 6.5–8.1)

## 2023-10-20 LAB — I-STAT CHEM 8, ED
BUN: 9 mg/dL (ref 8–23)
Calcium, Ion: 1.04 mmol/L — ABNORMAL LOW (ref 1.15–1.40)
Chloride: 104 mmol/L (ref 98–111)
Creatinine, Ser: 0.5 mg/dL (ref 0.44–1.00)
Glucose, Bld: 204 mg/dL — ABNORMAL HIGH (ref 70–99)
HCT: 44 % (ref 36.0–46.0)
Hemoglobin: 15 g/dL (ref 12.0–15.0)
Potassium: 4.3 mmol/L (ref 3.5–5.1)
Sodium: 139 mmol/L (ref 135–145)
TCO2: 27 mmol/L (ref 22–32)

## 2023-10-20 LAB — CBG MONITORING, ED: Glucose-Capillary: 196 mg/dL — ABNORMAL HIGH (ref 70–99)

## 2023-10-20 LAB — CK: Total CK: 179 U/L (ref 38–234)

## 2023-10-20 LAB — TROPONIN I (HIGH SENSITIVITY)
Troponin I (High Sensitivity): 11 ng/L (ref <18)
Troponin I (High Sensitivity): 12 ng/L (ref <18)

## 2023-10-20 LAB — PROCALCITONIN: Procalcitonin: <0.1 ng/mL

## 2023-10-20 LAB — AMMONIA: Ammonia: 28 umol/L (ref 9–35)

## 2023-10-20 LAB — BRAIN NATRIURETIC PEPTIDE: B Natriuretic Peptide: 144.1 pg/mL — ABNORMAL HIGH (ref 0.0–100.0)

## 2023-10-20 MED ORDER — ACETAMINOPHEN 650 MG RE SUPP
650.0000 mg | Freq: Four times a day (QID) | RECTAL | Status: DC | PRN
  Administered 2023-11-03: 650 mg via RECTAL
  Filled 2023-10-20: qty 1

## 2023-10-20 MED ORDER — ONDANSETRON HCL 4 MG/2ML IJ SOLN: 4.0000 mg | Freq: Four times a day (QID) | INTRAMUSCULAR | Status: DC | PRN

## 2023-10-20 MED ORDER — CEFTRIAXONE SODIUM 1 G IJ SOLR
1.0000 g | Freq: Once | INTRAMUSCULAR | Status: AC
  Administered 2023-10-20: 1 g via INTRAVENOUS
  Filled 2023-10-20: qty 10

## 2023-10-20 MED ORDER — ALBUTEROL SULFATE (2.5 MG/3ML) 0.083% IN NEBU
2.5000 mg | INHALATION_SOLUTION | Freq: Four times a day (QID) | RESPIRATORY_TRACT | Status: DC | PRN
  Administered 2023-10-23: 2.5 mg via RESPIRATORY_TRACT
  Filled 2023-10-20: qty 3

## 2023-10-20 MED ORDER — ACETAMINOPHEN 325 MG PO TABS
650.0000 mg | ORAL_TABLET | Freq: Four times a day (QID) | ORAL | Status: DC | PRN
  Administered 2023-11-03: 650 mg via ORAL
  Filled 2023-10-20 (×2): qty 2

## 2023-10-20 MED ORDER — ALPRAZOLAM 0.25 MG PO TABS
0.2500 mg | ORAL_TABLET | Freq: Two times a day (BID) | ORAL | Status: DC | PRN
  Administered 2023-10-22 – 2023-11-08 (×16): 0.25 mg via ORAL
  Filled 2023-10-20 (×17): qty 1

## 2023-10-20 MED ORDER — HALOPERIDOL LACTATE 5 MG/ML IJ SOLN: 1.0000 mg | Freq: Once | INTRAMUSCULAR | Status: DC

## 2023-10-20 MED ORDER — HALOPERIDOL LACTATE 5 MG/ML IJ SOLN
INTRAMUSCULAR | Status: AC
  Administered 2023-10-20: 5 mg
  Filled 2023-10-20: qty 1

## 2023-10-20 MED ORDER — INSULIN ASPART 100 UNIT/ML IJ SOLN
0.0000 [IU] | Freq: Three times a day (TID) | INTRAMUSCULAR | Status: DC
  Administered 2023-10-20: 1 [IU] via SUBCUTANEOUS
  Administered 2023-10-21: 3 [IU] via SUBCUTANEOUS
  Administered 2023-10-21 (×2): 2 [IU] via SUBCUTANEOUS
  Administered 2023-10-22: 5 [IU] via SUBCUTANEOUS
  Administered 2023-10-22: 7 [IU] via SUBCUTANEOUS
  Administered 2023-10-22 – 2023-10-23 (×3): 3 [IU] via SUBCUTANEOUS
  Administered 2023-10-23: 5 [IU] via SUBCUTANEOUS
  Administered 2023-10-24 (×2): 3 [IU] via SUBCUTANEOUS
  Administered 2023-10-25 (×2): 1 [IU] via SUBCUTANEOUS
  Administered 2023-10-26 – 2023-10-27 (×4): 2 [IU] via SUBCUTANEOUS
  Administered 2023-10-27: 3 [IU] via SUBCUTANEOUS
  Administered 2023-10-27: 2 [IU] via SUBCUTANEOUS
  Administered 2023-10-28: 1 [IU] via SUBCUTANEOUS
  Administered 2023-10-29 – 2023-10-31 (×2): 5 [IU] via SUBCUTANEOUS
  Administered 2023-10-31 – 2023-11-01 (×2): 1 [IU] via SUBCUTANEOUS
  Administered 2023-11-01: 2 [IU] via SUBCUTANEOUS
  Administered 2023-11-02 – 2023-11-03 (×3): 1 [IU] via SUBCUTANEOUS
  Administered 2023-11-03: 2 [IU] via SUBCUTANEOUS
  Administered 2023-11-04 (×2): 1 [IU] via SUBCUTANEOUS
  Administered 2023-11-04: 3 [IU] via SUBCUTANEOUS
  Administered 2023-11-05: 1 [IU] via SUBCUTANEOUS
  Administered 2023-11-05 – 2023-11-06 (×5): 2 [IU] via SUBCUTANEOUS
  Administered 2023-11-07: 3 [IU] via SUBCUTANEOUS
  Administered 2023-11-07 (×2): 1 [IU] via SUBCUTANEOUS
  Administered 2023-11-08: 3 [IU] via SUBCUTANEOUS
  Administered 2023-11-08 – 2023-11-09 (×2): 1 [IU] via SUBCUTANEOUS
  Administered 2023-11-09 – 2023-11-10 (×3): 2 [IU] via SUBCUTANEOUS
  Administered 2023-11-10: 3 [IU] via SUBCUTANEOUS
  Administered 2023-11-11: 2 [IU] via SUBCUTANEOUS

## 2023-10-20 MED ORDER — BUDESON-GLYCOPYRROL-FORMOTEROL 160-9-4.8 MCG/ACT IN AERO
2.0000 | INHALATION_SPRAY | Freq: Two times a day (BID) | RESPIRATORY_TRACT | Status: DC
Start: 2023-10-20 — End: 2023-11-11
  Administered 2023-10-20 – 2023-11-11 (×38): 2 via RESPIRATORY_TRACT
  Filled 2023-10-20: qty 5.9

## 2023-10-20 MED ORDER — LISINOPRIL 20 MG PO TABS
20.0000 mg | ORAL_TABLET | Freq: Every day | ORAL | Status: DC
Start: 2023-10-21 — End: 2023-10-29
  Administered 2023-10-21 – 2023-10-28 (×8): 20 mg via ORAL
  Filled 2023-10-20 (×8): qty 1

## 2023-10-20 MED ORDER — SODIUM CHLORIDE 0.9% FLUSH
3.0000 mL | Freq: Two times a day (BID) | INTRAVENOUS | Status: DC
  Administered 2023-10-20 – 2023-11-11 (×39): 3 mL via INTRAVENOUS

## 2023-10-20 MED ORDER — LISINOPRIL 20 MG PO TABS
20.0000 mg | ORAL_TABLET | Freq: Every day | ORAL | Status: DC
Start: 2023-10-20 — End: 2023-10-20

## 2023-10-20 MED ORDER — ONDANSETRON HCL 4 MG PO TABS: 4.0000 mg | ORAL_TABLET | Freq: Four times a day (QID) | ORAL | Status: DC | PRN

## 2023-10-20 MED ORDER — ENOXAPARIN SODIUM 40 MG/0.4ML IJ SOSY
40.0000 mg | PREFILLED_SYRINGE | INTRAMUSCULAR | Status: DC
  Administered 2023-10-20 – 2023-11-11 (×23): 40 mg via SUBCUTANEOUS
  Filled 2023-10-20 (×24): qty 0.4

## 2023-10-20 MED ORDER — LACTATED RINGERS IV BOLUS
500.0000 mL | Freq: Once | INTRAVENOUS | Status: AC
  Administered 2023-10-20: 500 mL via INTRAVENOUS

## 2023-10-20 MED ORDER — ATORVASTATIN CALCIUM 40 MG PO TABS
40.0000 mg | ORAL_TABLET | Freq: Every day | ORAL | Status: DC
  Administered 2023-10-21 – 2023-11-11 (×21): 40 mg via ORAL
  Filled 2023-10-20 (×23): qty 1

## 2023-10-20 MED ORDER — INSULIN GLARGINE 100 UNIT/ML ~~LOC~~ SOLN
15.0000 [IU] | Freq: Every day | SUBCUTANEOUS | Status: DC
Start: 2023-10-20 — End: 2023-10-29
  Administered 2023-10-21 – 2023-10-28 (×9): 15 [IU] via SUBCUTANEOUS
  Filled 2023-10-20 (×11): qty 0.15

## 2023-10-20 NOTE — ED Provider Notes (Signed)
 [facility] AT [facility] Provider Note   CSN: [id number] Arrival date & time: 10/20/23  9077     Patient presents with: Altered Mental Status   Allison Hughes is a 73 y.o. female.   HPI 73 year old female brought in by EMS for altered mental status.  History is primarily from her friend, [name], over the phone.  Patient lives by herself but he comes over every day 8 AM to 6 PM to keep her company and make sure she is okay.  She was discharged from the hospital a few weeks ago and went to rehab.  However she was discharged from rehab due to being able to walk 72 feet per them.  Over the last week and a half since getting out she has become more confused, altered, and is barely eating.  Patient's legs have become weak and can barely walk at all now.  She fell 2 days ago but he was able to get her back up.  However today when he went to go to her house he found her laying on the ground, facedown, in the living room.  Unclear how long she was on the ground for.  Patient is altered and at this point is not really able to contribute much to the history.  Prior to Admission medications   Medication Sig Start Date End Date Taking? Authorizing Provider  acetaminophen  (TYLENOL ) 500 MG tablet Take 2 tablets (1,000 mg total) by mouth every 8 (eight) hours as needed for mild pain (pain score 1-3) or headache. 09/23/23   Kathrin Mignon DASEN, MD  albuterol  (VENTOLIN  HFA) 108 (90 Base) MCG/ACT inhaler Inhale 2 puffs into the lungs every 6 (six) hours as needed for wheezing or shortness of breath. 06/06/22   Ghimire, Donalda HERO, MD  ALPRAZolam  (XANAX ) 0.25 MG tablet Take 1 tablet (0.25 mg total) by mouth 2 (two) times daily as needed for anxiety. 09/23/23   Gonfa, Taye T, MD  atorvastatin  (LIPITOR) 40 MG tablet Take 40 mg by mouth daily. 08/06/18   [provider]  BREZTRI  AEROSPHERE 160-9-4.8 MCG/ACT AERO inhaler Inhale 2 puffs into the lungs 2 (two) times daily. 06/24/23    [provider]  lisinopril  (PRINIVIL ,ZESTRIL ) 20 MG tablet Take 20 mg by mouth at bedtime.    [provider]  metFORMIN  (GLUCOPHAGE -XR) 500 MG 24 hr tablet Take 500 mg by mouth at bedtime. 10/03/20   [provider]  pantoprazole  (PROTONIX ) 40 MG tablet Take 1 tablet (40 mg total) by mouth 2 (two) times daily for 30 days, THEN 1 tablet (40 mg total) daily. 09/23/23 12/22/23  Gonfa, Taye T, MD  TRESIBA  100 UNIT/ML SOLN Inject 15 Units into the skin at bedtime. 09/23/23   Gonfa, Taye T, MD    Allergies: Iodine and Shrimp [shellfish allergy]    Review of Systems  Unable to perform ROS: Mental status change    Updated Vital Signs BP 111/60   Pulse 72   Temp (!) 97.4 F (36.3 C) (Axillary)   Resp 16   Ht 5' 2 (1.575 m)   Wt 82.1 kg   SpO2 100%   BMI 33.10 kg/m   Physical Exam Vitals and nursing note reviewed.  Constitutional:      Appearance: She is well-developed.  HENT:     Head: Normocephalic and atraumatic.     Mouth/Throat:     Mouth: Mucous membranes are dry.  Eyes:     Pupils: Pupils are equal, round, and  reactive to light.  Cardiovascular:     Rate and Rhythm: Normal rate and regular rhythm.     Heart sounds: Normal heart sounds.  Pulmonary:     Effort: Pulmonary effort is normal.     Breath sounds: Normal breath sounds.  Abdominal:     Palpations: Abdomen is soft.     Tenderness: There is no abdominal tenderness.  Musculoskeletal:     Comments: No obvious injuries.  No C/T/L-spine tenderness.  No obvious extremity injury.  Skin:    General: Skin is warm and dry.  Neurological:     Mental Status: She is lethargic.     Comments: Patient is quite sleepy.  She will briefly wake up and answer some questions but is very slow to respond and sometimes just does not answer.  Unable to significantly answer orientation questions except for knowing that she is in the hospital. Weakly grips my hands on both sides but barely moves her legs upon  request.  Hard to tell if she cannot versus is not following commands well.     (all labs ordered are listed, but only abnormal results are displayed) Labs Reviewed  CBC WITH DIFFERENTIAL/PLATELET - Abnormal; Notable for the following components:      Result Value   RBC 6.01 (*)    MCV 74.9 (*)    MCH 22.1 (*)    MCHC 29.6 (*)    RDW 23.5 (*)    All other components within normal limits  URINALYSIS, W/ REFLEX TO CULTURE (INFECTION SUSPECTED) - Abnormal; Notable for the following components:   Color, Urine AMBER (*)    APPearance HAZY (*)    Glucose, UA >=500 (*)    Hgb urine dipstick SMALL (*)    Protein, ur 100 (*)    Leukocytes,Ua TRACE (*)    Bacteria, UA MANY (*)    All other components within normal limits  I-STAT CHEM 8, ED - Abnormal; Notable for the following components:   Glucose, Bld 204 (*)    Calcium , Ion 1.04 (*)    All other components within normal limits  I-STAT ARTERIAL BLOOD GAS, ED - Abnormal; Notable for the following components:   pCO2 arterial 48.4 (*)    pO2, Arterial 51 (*)    Bicarbonate 29.4 (*)    Acid-Base Excess 3.0 (*)    Potassium 3.1 (*)    All other components within normal limits  CBG MONITORING, ED - Abnormal; Notable for the following components:   Glucose-Capillary 196 (*)    All other components within normal limits  URINE CULTURE  AMMONIA  CK  COMPREHENSIVE METABOLIC PANEL WITH GFR  BRAIN NATRIURETIC PEPTIDE  TROPONIN I (HIGH SENSITIVITY)  TROPONIN I (HIGH SENSITIVITY)    EKG: EKG Interpretation Date/Time:  Monday October 20 2023 09:33:34 EDT Ventricular Rate:  83 PR Interval:  127 QRS Duration:  70 QT Interval:  388 QTC Calculation: 456 R Axis:   87  Text Interpretation: Sinus rhythm Borderline right axis deviation Borderline abnrm T, anterolateral leads no significant change since Sept 2025 Confirmed by Freddi Hamilton [phone]) on 10/20/2023 9:37:13 AM  Radiology: CT Cervical Spine Wo Contrast Result Date:  10/20/2023 EXAM: CT CERVICAL SPINE WITHOUT CONTRAST 10/20/2023 10:54:35 AM TECHNIQUE: CT of the cervical spine was performed without the administration of intravenous contrast. Multiplanar reformatted images are provided for review. Automated exposure control, iterative reconstruction, and/or weight based adjustment of the mA/kV was utilized to reduce the radiation dose to as low as reasonably achievable. COMPARISON:  None available. CLINICAL HISTORY: Neck trauma (Age >= 65y). Mental status change, unknown cause. FINDINGS: CERVICAL SPINE: BONES AND ALIGNMENT: No acute fracture or traumatic malalignment. DEGENERATIVE CHANGES: Moderate chronic degenerative disc disease at C4-C5 and C5-C6, with posterior endplate ridging causing moderate central spinal canal stenosis at C5-C6 with severe bilateral neuroforaminal stenosis. There is also moderate central spinal canal stenosis and moderate-to-severe bilateral neural foraminal stenosis at C6-C7. SOFT TISSUES: No prevertebral soft tissue swelling. There is calcification within the carotid bulbs. There is mild emphysema within the lung apices. IMPRESSION: 1. No acute abnormality of the cervical spine related to the reported neck trauma. 2. Moderate chronic degenerative disc disease at C4-5 and C5-6, with associated moderate central spinal canal stenosis at C5-6 and severe bilateral neuroforaminal stenosis. 3. Moderate central spinal canal stenosis and moderate-to-severe bilateral neural foraminal stenosis at C6-7. Electronically signed by: Evalene Coho MD 10/20/2023 11:09 AM EDT RP Workstation: [id number]   CT Head Wo Contrast Result Date: 10/20/2023 EXAM: CT HEAD WITHOUT CONTRAST 10/20/2023 10:54:35 AM TECHNIQUE: CT of the head was performed without the administration of intravenous contrast. Automated exposure control, iterative reconstruction, and/or weight based adjustment of the mA/kV was utilized to reduce the radiation dose to as low as reasonably achievable.  COMPARISON: CT of the head dated 09/19/2023. CLINICAL HISTORY: Mental status change, unknown cause. FINDINGS: BRAIN AND VENTRICLES: No acute hemorrhage. No evidence of acute infarct. No hydrocephalus. No extra-axial collection. No mass effect or midline shift. There is moderate generalized cerebral volume loss. There are chronic encephalomalacia changes again noted within the right parietal lobe. Focal encephalomalacia changes are also again demonstrated in the left cerebellar hemisphere. There is moderately advanced diffuse cerebral white matter disease. Senescent calcifications are noted within the basal ganglia. There is moderate calcification within the carotid siphons. ORBITS: No acute abnormality. SINUSES: No acute abnormality. SOFT TISSUES AND SKULL: No acute soft tissue abnormality. No skull fracture. IMPRESSION: 1. No acute intracranial abnormality. 2. Moderate generalized cerebral volume loss and moderately advanced diffuse cerebral white matter disease. 3. 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 Electronically signed by: Evalene Coho MD 10/20/2023 11:06 AM EDT RP Workstation: [id number]   DG Chest Portable 1 View Result Date: 10/20/2023 EXAM: 1 VIEW XRAY OF THE CHEST 10/20/2023 10:09:00 AM COMPARISON: 09/19/2023 CLINICAL HISTORY: AMS. AMS- 3-4 weeks per family. FINDINGS: LINES, TUBES AND DEVICES: Left chest loop recorder device in place. LUNGS AND PLEURA: Decreased mild pulmonary edema. No focal pulmonary opacity. No pleural effusion. No pneumothorax. HEART AND MEDIASTINUM: No acute abnormality of the cardiac and mediastinal silhouettes. BONES AND SOFT TISSUES: No acute osseous abnormality. IMPRESSION: 1. Decreased mild pulmonary edema. Electronically signed by: Evalene Coho MD 10/20/2023 10:34 AM EDT RP Workstation: [id number]     Procedures   Medications Ordered in the ED  insulin  aspart (novoLOG ) injection 0-9 Units (has no administration in  time range)  lactated ringers  bolus 500 mL (0 mLs Intravenous Stopped 10/20/23 1234)  cefTRIAXone  (ROCEPHIN ) 1 g in sodium chloride  0.9 % 100 mL IVPB (0 g Intravenous Stopped 10/20/23 1338)                                    Medical Decision Making Amount and/or Complexity of Data Reviewed Independent Historian: friend External Data Reviewed: notes. Labs: ordered.    Details: UTI Radiology: ordered and independent interpretation performed.    Details: No head bleed ECG/medicine tests: ordered  and independent interpretation performed.    Details: No acute ischemia  Risk Decision regarding hospitalization.   Patient presents with recurrent altered mental status.  Similar to when she was here a few weeks ago.  Has some borderline low O2 sats.  Workup so far is unremarkable except she probably has a UTI with many bacteria on a catheterized specimen.  Otherwise no clear cause.  Some labs have been hemolyzed and requiring recollection but I do not think this will change course at this time.  No respiratory acidosis to account for change in mental status.  Started her on Rocephin  and gentle fluids given she was on the floor for an undetermined amount of time.  Otherwise she will need admission.  Updated her friend over the phone.  Dr. Claudene will admit.     Final diagnoses:  Altered mental status, unspecified altered mental status type    ED Discharge Orders     None          Freddi Hamilton, MD 10/20/23 1353

## 2023-10-20 NOTE — ED Notes (Signed)
 Pt O2 dropped to 87% RA. Pt placed on 2L [place]

## 2023-10-20 NOTE — ED Notes (Signed)
 CCMD called.

## 2023-10-20 NOTE — H&P (Signed)
 History and Physical    Patient: Allison Hughes FMW:[id number] DOB: 28-Jan-1950 DOA: 10/20/2023 DOS: the patient was seen and examined on 10/20/2023 PCP: Valma Carwin, MD  Patient coming from: Home via EMS  Chief Complaint:  Chief Complaint  Patient presents with   Altered Mental Status   HPI: Allison Hughes is a 73 y.o. female with medical history significant of hypertension, hyperlipidemia, CVA, COPD, diabetes mellitus type 2, GI bleed, neuroendocrine ulcer, and obesity who presents after being found on the floor confused.  History is obtained over the phone from her friend who comes to check on her daily.  Review of records note patient had just recently been hospitalized from 9/5-9/10 after being found at home lying on the floor altered.  Patient was treated for acute hypoxic respiratory failure thought secondary to multifocal pneumonia as well as Klebsiella pneumonia and UTI.  During this time she was also treated for acute blood loss anemia and transfuse 1 unit packed red blood cells.  Seems that she had just recently had a EGD at [facility] that revealed concerns for gastric neuroendocrine ulcer that was clean-based at that time.  Patient did not undergo any procedure during hospitalization and was recommended to follow-up in the outpatient setting.  Patient was noted to be acutely altered but thought likely related to her presenting symptoms at the time.  She had gone to [facility] facility.  It was noted that her friend tried to appeal discharge as she was unable to move walk at the time and lives alone in a couple weeks ago but was denied.  Over the past week, she has experienced increasing confusion, forgetfulness, and paranoid thoughts about people trying to take her money. She has not had visual hallucinations.  This morning, she was found on the floor by her friend, who last saw her the night before around 6 PM. She has not complained of fever, cough, or stomach pains, but  her legs have been hurting, and she has no strength in them. Her friend, who is 15 years old, reports that he cannot assist her when she falls due to her lack of strength.  She has a history of COPD and uses  inhalers but is not on oxygen. She does not smoke or drink alcohol. Her friend notes that it is very difficult to give her medicine as she is resistant to taking it.  She has a history of strokes and has a loop recorder for her heart, which was placed a couple of years ago.   On admission into the emergency department patient was noted to be afebrile with blood pressures elevated up to 145/73, and O2 saturations noted to be as low as 87% with improvement on 2 L nasal cannula oxygen.  Labs noted WBC 6.7, hemoglobin 13.3,  glucose 204, and CK 179.  ABG noted pH to be 7.389 with pCO2 mildly elevated at 48.4, pO2 51.  Chest x-ray noted decreased mild pulmonary edema present.  CT scan of the head and cervical spine did not note any acute intercranial abnormality with moderate generalized cerebral volume loss with moderate to advanced diffuse cerebral white matter disease present.  Urinalysis noted small hemoglobin, trace leukocytes, many bacteria present, and 6-10 WBCs.   .  Urine cultures were obtained. Patient was given 500 mL of lactated Ringer 's and Rocephin  IV.   Review of Systems: unable to review all systems due to the inability of the patient to answer questions. Past Medical History:  Diagnosis Date  Acute pyelonephritis 02/02/2016   Diabetes mellitus 01/15/2004   T2DM   Hyperlipidemia    Hypertension    Obesity (BMI 30-39.9)    Pneumonia 09/06/2020   Sepsis (HCC) 02/02/2016   Stroke [facility])    Past Surgical History:  Procedure Laterality Date   CESAREAN SECTION     LOOP RECORDER INSERTION N/A 09/08/2020   Procedure: LOOP RECORDER INSERTION;  Surgeon: Inocencio Soyla Lunger, MD;  Location: [facility] INVASIVE CV LAB;  Service: Cardiovascular;  Laterality: N/A;   ORIF ANKLE FRACTURE  05/11/2011    Procedure: OPEN REDUCTION INTERNAL FIXATION (ORIF) ANKLE FRACTURE;  Surgeon: Jerona LULLA Sage, MD;  Location: [facility] OR;  Service: Orthopedics;  Laterality: Right;   TUBAL LIGATION     Social History:  reports that she has been smoking cigarettes. She has a 40 pack-year smoking history. She quit smokeless tobacco use about 12 years ago. She reports that she does not drink alcohol and does not use drugs.  Allergies  Allergen Reactions   Iodine Swelling   Shrimp [Shellfish Allergy] Swelling    Fresh shrimp    Family History  Problem Relation Age of Onset   Drug abuse Daughter    Diabetes Daughter     Prior to Admission medications   Medication Sig Start Date End Date Taking? Authorizing Provider  acetaminophen  (TYLENOL ) 500 MG tablet Take 2 tablets (1,000 mg total) by mouth every 8 (eight) hours as needed for mild pain (pain score 1-3) or headache. 09/23/23   Kathrin Mignon DASEN, MD  albuterol  (VENTOLIN  HFA) 108 (90 Base) MCG/ACT inhaler Inhale 2 puffs into the lungs every 6 (six) hours as needed for wheezing or shortness of breath. 06/06/22   Ghimire, Donalda HERO, MD  ALPRAZolam  (XANAX ) 0.25 MG tablet Take 1 tablet (0.25 mg total) by mouth 2 (two) times daily as needed for anxiety. 09/23/23   Gonfa, Taye T, MD  atorvastatin  (LIPITOR) 40 MG tablet Take 40 mg by mouth daily. 08/06/18   [provider]  BREZTRI  AEROSPHERE 160-9-4.8 MCG/ACT AERO inhaler Inhale 2 puffs into the lungs 2 (two) times daily. 06/24/23   [provider]  lisinopril  (PRINIVIL ,ZESTRIL ) 20 MG tablet Take 20 mg by mouth at bedtime.    [provider]  metFORMIN  (GLUCOPHAGE -XR) 500 MG 24 hr tablet Take 500 mg by mouth at bedtime. 10/03/20   [provider]  pantoprazole  (PROTONIX ) 40 MG tablet Take 1 tablet (40 mg total) by mouth 2 (two) times daily for 30 days, THEN 1 tablet (40 mg total) daily. 09/23/23 12/22/23  Gonfa, Taye T, MD  TRESIBA  100 UNIT/ML SOLN Inject 15 Units into the skin at bedtime. 09/23/23    Kathrin Mignon DASEN, MD    Physical Exam: Vitals:   10/20/23 1012 10/20/23 1114 10/20/23 1130 10/20/23 1235  BP:   (!) 127/94 (!) 145/73  Pulse: 78  75 80  Resp: 18  16 16   Temp:  (!) 97.4 F (36.3 C)    TempSrc:  Rectal    SpO2: 95%  100% 100%  Weight:      Height:        Constitutional: Elderly female who is lethargic and easily arousable Eyes: PERRL, lids and conjunctivae normal ENMT: Mucous membranes are dry.  Normal dentition.  Neck: normal, supple, no JVD present Respiratory: clear to auscultation bilaterally, no wheezing, no crackles. Normal respiratory effort. No accessory muscle use.  Currently on 2 L of cannula oxygen with O2 saturations maintained Cardiovascular: Regular rate and rhythm, no murmurs /  rubs / gallops. No extremity edema. 2+ pedal pulses. Abdomen: no tenderness, no masses palpated.   Bowel sounds positive.  Musculoskeletal: no clubbing / cyanosis. No joint deformity upper and lower extremities.  Normal muscle tone.  Skin: no rashes, lesions, ulcers. No induration Neurologic: CN 2-12 grossly intact.  Patient is able to move all extremities to noxious stimuli Psychiatric: Lethargic unable to assess as patient not really talking at this time.  Data Reviewed:  EKG reveals sinus rhythm at 83 bpm. Reviewed labs, imaging, and pertinent records as documented.  Assessment and Plan:  Acute metabolic encephalopathy Patient is normally has some mild short-term memory loss on reported, but recently had been more altered over the last week with reports of delusions.  CT scan of the head did not reveal any acute intercranial abnormality.  Thought possibly related to urinary tract infection.  Lower suspicion for stroke at this time.  Patient seems to have baseline cognitive impairment. - Delirium precautions - Neurochecks - PT/OT/speech to evaluate and treat - Consider need to check MRI of the brain or repeat CT, if symptoms do not improve.  Patient has a loop recorder  unclear if this is MRI compatible  Suspect that urinary tract infection Present on admission. Urinalysis noted small hemoglobin, trace leukocytes, many bacteria present, and 6-10 WBCs.   Patient had been given empiric antibiotics of Rocephin . - Follow-up urine culture - Continue Rocephin   Acute respiratory failure with hypoxia COPD, without acute exacerbation O2 saturations were noted to be as low as 87% on room air with improvement on 2 L nasal cannula oxygen.  BG appeared to be compensated. - Continuous pulse oximetry with oxygen maintain O2 saturation greater than 90% - Continue Breztri  - Albuterol  nebs as needed for shortness of breath/wheezing  Diastolic congestive heart failure Chronic.  Patient appears to be fairly euvolemic on physical exam.  Chest x-ray noted decrease pulmonary edema.  Last echocardiogram noted EF to be 65 to 70% with grade 1 diastolic dysfunction.  Patient had been given 500 mL of IV fluids. - Strict I&O's and daily weights - Check BMP and procalcitonin.  Fall, at home Patient had a unwitnessed fall at home, but have been having leg weakness even prior to getting discharged from rehab.  CK noted to be 167.  Patient has a loop recorder in place which was last checked last night at 7:30 PM and noted no arrhythmias. - PT/OT to eval and treat   Essential hypertension Blood pressures initially elevated up to 160/75. - Continue lisinopril  tomorrow evening if  Controlled diabetes mellitus type 2, with long-term use of insulin  Last available hemoglobin A1c noted to be 6.9 when checked on 09/20/2023. - Hypoglycemic protocols - Hold metformin  - Continue Tresiba  15 units nightly - CBGs before every meal with SSI - Adjust insulin  regimen as deemed medically appropriate  Hyperlipidemia - Continue atorvastatin   History of CVA Patient with a history of CVA with last back in 2022.  History of gastric ulcer GERD - Continue PPI  Obesity, class I BMI 33.1  kg/m  DVT prophylaxis: Lovenox  Advance Care Planning:   Code Status: Full Code   Consults:   Family Communication: Patient's emergency contact updated over the phone Severity of Illness: The appropriate patient status for this patient is OBSERVATION. Observation status is judged to be reasonable and necessary in order to provide the required intensity of service to ensure the patient's safety. The patient's presenting symptoms, physical exam findings, and initial radiographic and laboratory data in the  context of their medical condition is felt to place them at decreased risk for further clinical deterioration. Furthermore, it is anticipated that the patient will be medically stable for discharge from the hospital within 2 midnights of admission.   Author: Maximino DELENA Sharps, MD 10/20/2023 1:17 PM  For on call review [email or web address].

## 2023-10-20 NOTE — Plan of Care (Signed)

## 2023-10-20 NOTE — ED Notes (Signed)
 Patient transported to CT

## 2023-10-20 NOTE — ED Triage Notes (Signed)
 Patient presents to ed from home via [facility]  per ems family states patient has been FTT and AMS  for 3-4 weeks , patient was found on the floor the am family states they think she layed there all night.  Patient is alert however confused. Per ems patient was recently in [facility] for the same , ems states family is looking for SNF  placement.

## 2023-10-20 NOTE — Plan of Care (Signed)

## 2023-10-21 ENCOUNTER — Ambulatory Visit (INDEPENDENT_AMBULATORY_CARE_PROVIDER_SITE_OTHER)

## 2023-10-21 ENCOUNTER — Observation Stay (HOSPITAL_COMMUNITY)

## 2023-10-21 DIAGNOSIS — N3000 Acute cystitis without hematuria: Secondary | ICD-10-CM | POA: Diagnosis not present

## 2023-10-21 DIAGNOSIS — F17210 Nicotine dependence, cigarettes, uncomplicated: Secondary | ICD-10-CM | POA: Diagnosis present

## 2023-10-21 DIAGNOSIS — E1165 Type 2 diabetes mellitus with hyperglycemia: Secondary | ICD-10-CM | POA: Diagnosis present

## 2023-10-21 DIAGNOSIS — K219 Gastro-esophageal reflux disease without esophagitis: Secondary | ICD-10-CM | POA: Diagnosis present

## 2023-10-21 DIAGNOSIS — R569 Unspecified convulsions: Secondary | ICD-10-CM | POA: Diagnosis not present

## 2023-10-21 DIAGNOSIS — Z91013 Allergy to seafood: Secondary | ICD-10-CM | POA: Diagnosis not present

## 2023-10-21 DIAGNOSIS — I110 Hypertensive heart disease with heart failure: Secondary | ICD-10-CM | POA: Diagnosis present

## 2023-10-21 DIAGNOSIS — W19XXXA Unspecified fall, initial encounter: Secondary | ICD-10-CM | POA: Diagnosis present

## 2023-10-21 DIAGNOSIS — Z794 Long term (current) use of insulin: Secondary | ICD-10-CM | POA: Diagnosis not present

## 2023-10-21 DIAGNOSIS — Z833 Family history of diabetes mellitus: Secondary | ICD-10-CM | POA: Diagnosis not present

## 2023-10-21 DIAGNOSIS — G9341 Metabolic encephalopathy: Secondary | ICD-10-CM | POA: Diagnosis present

## 2023-10-21 DIAGNOSIS — E860 Dehydration: Secondary | ICD-10-CM | POA: Diagnosis present

## 2023-10-21 DIAGNOSIS — Z6833 Body mass index (BMI) 33.0-33.9, adult: Secondary | ICD-10-CM | POA: Diagnosis not present

## 2023-10-21 DIAGNOSIS — Y92009 Unspecified place in unspecified non-institutional (private) residence as the place of occurrence of the external cause: Secondary | ICD-10-CM | POA: Diagnosis not present

## 2023-10-21 DIAGNOSIS — J9601 Acute respiratory failure with hypoxia: Secondary | ICD-10-CM | POA: Diagnosis present

## 2023-10-21 DIAGNOSIS — Z91041 Radiographic dye allergy status: Secondary | ICD-10-CM | POA: Diagnosis not present

## 2023-10-21 DIAGNOSIS — U071 COVID-19: Secondary | ICD-10-CM | POA: Diagnosis present

## 2023-10-21 DIAGNOSIS — I10 Essential (primary) hypertension: Secondary | ICD-10-CM | POA: Diagnosis not present

## 2023-10-21 DIAGNOSIS — N390 Urinary tract infection, site not specified: Secondary | ICD-10-CM | POA: Diagnosis present

## 2023-10-21 DIAGNOSIS — R4182 Altered mental status, unspecified: Secondary | ICD-10-CM

## 2023-10-21 DIAGNOSIS — F05 Delirium due to known physiological condition: Secondary | ICD-10-CM | POA: Diagnosis not present

## 2023-10-21 DIAGNOSIS — E785 Hyperlipidemia, unspecified: Secondary | ICD-10-CM | POA: Diagnosis present

## 2023-10-21 DIAGNOSIS — E876 Hypokalemia: Secondary | ICD-10-CM | POA: Diagnosis present

## 2023-10-21 DIAGNOSIS — Z7189 Other specified counseling: Secondary | ICD-10-CM | POA: Diagnosis not present

## 2023-10-21 DIAGNOSIS — J449 Chronic obstructive pulmonary disease, unspecified: Secondary | ICD-10-CM | POA: Diagnosis present

## 2023-10-21 DIAGNOSIS — I63412 Cerebral infarction due to embolism of left middle cerebral artery: Secondary | ICD-10-CM

## 2023-10-21 DIAGNOSIS — Z7984 Long term (current) use of oral hypoglycemic drugs: Secondary | ICD-10-CM | POA: Diagnosis not present

## 2023-10-21 DIAGNOSIS — F03918 Unspecified dementia, unspecified severity, with other behavioral disturbance: Secondary | ICD-10-CM | POA: Diagnosis present

## 2023-10-21 DIAGNOSIS — Z515 Encounter for palliative care: Secondary | ICD-10-CM | POA: Diagnosis not present

## 2023-10-21 DIAGNOSIS — I5032 Chronic diastolic (congestive) heart failure: Secondary | ICD-10-CM | POA: Diagnosis present

## 2023-10-21 DIAGNOSIS — E46 Unspecified protein-calorie malnutrition: Secondary | ICD-10-CM | POA: Diagnosis present

## 2023-10-21 LAB — COMPREHENSIVE METABOLIC PANEL WITH GFR
ALT: 25 U/L (ref 0–44)
AST: 25 U/L (ref 15–41)
Albumin: 2.6 g/dL — ABNORMAL LOW (ref 3.5–5.0)
Alkaline Phosphatase: 92 U/L (ref 38–126)
Anion gap: 8 (ref 5–15)
BUN: 8 mg/dL (ref 8–23)
CO2: 26 mmol/L (ref 22–32)
Calcium: 8.8 mg/dL — ABNORMAL LOW (ref 8.9–10.3)
Chloride: 105 mmol/L (ref 98–111)
Creatinine, Ser: 0.69 mg/dL (ref 0.44–1.00)
GFR, Estimated: >60 mL/min (ref >60)
Glucose, Bld: 234 mg/dL — ABNORMAL HIGH (ref 70–99)
Potassium: 2.9 mmol/L — ABNORMAL LOW (ref 3.5–5.1)
Sodium: 139 mmol/L (ref 135–145)
Total Bilirubin: 0.5 mg/dL (ref 0.0–1.2)
Total Protein: 5.6 g/dL — ABNORMAL LOW (ref 6.5–8.1)

## 2023-10-21 LAB — RESPIRATORY PANEL BY PCR

## 2023-10-21 LAB — GLUCOSE, CAPILLARY
Glucose-Capillary: 191 mg/dL — ABNORMAL HIGH (ref 70–99)
Glucose-Capillary: 203 mg/dL — ABNORMAL HIGH (ref 70–99)
Glucose-Capillary: 172 mg/dL — ABNORMAL HIGH (ref 70–99)
Glucose-Capillary: 260 mg/dL — ABNORMAL HIGH (ref 70–99)

## 2023-10-21 LAB — TSH: TSH: 1.292 u[IU]/mL (ref 0.350–4.500)

## 2023-10-21 LAB — CBC
HCT: 36.6 % (ref 36.0–46.0)
Hemoglobin: 11.1 g/dL — ABNORMAL LOW (ref 12.0–15.0)
MCH: 22.4 pg — ABNORMAL LOW (ref 26.0–34.0)
MCHC: 30.3 g/dL (ref 30.0–36.0)
MCV: 73.9 fL — ABNORMAL LOW (ref 80.0–100.0)
Platelets: 259 K/uL (ref 150–400)
RBC: 4.95 MIL/uL (ref 3.87–5.11)
RDW: 23.5 % — ABNORMAL HIGH (ref 11.5–15.5)
WBC: 6.6 K/uL (ref 4.0–10.5)
nRBC: 0 % (ref 0.0–0.2)

## 2023-10-21 LAB — VITAMIN B12: Vitamin B-12: 440 pg/mL (ref 180–914)

## 2023-10-21 LAB — SARS CORONAVIRUS 2 BY RT PCR: SARS Coronavirus 2 by RT PCR: POSITIVE — AB

## 2023-10-21 LAB — MAGNESIUM: Magnesium: 1.4 mg/dL — ABNORMAL LOW (ref 1.7–2.4)

## 2023-10-21 MED ORDER — SODIUM CHLORIDE 0.9 % IV SOLN: INTRAVENOUS | Status: DC

## 2023-10-21 MED ORDER — POTASSIUM CHLORIDE CRYS ER 20 MEQ PO TBCR
40.0000 meq | EXTENDED_RELEASE_TABLET | Freq: Two times a day (BID) | ORAL | Status: AC
  Administered 2023-10-21 (×2): 40 meq via ORAL
  Filled 2023-10-21 (×2): qty 2

## 2023-10-21 MED ORDER — ENSURE MAX PROTEIN PO LIQD
11.0000 [oz_av] | Freq: Two times a day (BID) | ORAL | Status: DC
  Administered 2023-10-21 – 2023-11-11 (×34): 11 [oz_av] via ORAL
  Filled 2023-10-21 (×45): qty 330

## 2023-10-21 MED ORDER — MAGNESIUM SULFATE 4 GM/100ML IV SOLN
4.0000 g | Freq: Once | INTRAVENOUS | Status: AC
Start: 2023-10-21 — End: 2023-10-21
  Administered 2023-10-21: 4 g via INTRAVENOUS
  Filled 2023-10-21: qty 100

## 2023-10-21 MED ORDER — SODIUM CHLORIDE 0.9 % IV SOLN
1.0000 g | INTRAVENOUS | Status: DC
  Administered 2023-10-21 – 2023-10-23 (×3): 1 g via INTRAVENOUS
  Filled 2023-10-21 (×3): qty 10

## 2023-10-21 MED ORDER — VITAMIN B-12 1000 MCG PO TABS
500.0000 ug | ORAL_TABLET | Freq: Every day | ORAL | Status: DC
  Administered 2023-10-21 – 2023-11-11 (×21): 500 ug via ORAL
  Filled 2023-10-21 (×22): qty 1

## 2023-10-21 NOTE — Plan of Care (Signed)

## 2023-10-21 NOTE — Progress Notes (Signed)
 VAT consulted for PIV  Arrived to pt room, observed RFA PIV infusing.  Consult complete

## 2023-10-21 NOTE — Progress Notes (Signed)
 Initial Nutrition Assessment  DOCUMENTATION CODES:   Obesity unspecified  INTERVENTION:  Continue carb modified diet Document % PO in flowsheet Ensure Max po BID, each supplement provides 150 kcal and 30 grams of protein.  Monitor CBG, adjust insulin  regimen as needed    NUTRITION DIAGNOSIS:   Altered nutrition lab value related to chronic illness ((diabetes)) as evidenced by  (A1c 6.9, CBGs > 180).   GOAL:   Patient will meet greater than or equal to 90% of their needs   MONITOR:   Labs, PO intake  REASON FOR ASSESSMENT:   Consult Assessment of nutrition requirement/status  ASSESSMENT:   Past medical history significant of hypertension, hyperlipidemia, CVA, COPD, diabetes mellitus type 2, GI bleed, neuroendocrine ulcer, and obesity who presents after being found on the floor confused.  Patient seen in room. Tech at bedside completing EEG. Patient oriented to self only, unable to provide diet or weight hx. No family at bedside. Patient unable to recall if she ate breakfast this morning. Denied feeling any pain. Pending speech evaluation. Weight stable x 1 month per chart hx.    Labs: Potassium 2.9 Glucose 234  Calcium  8.8 CBG 173-234 A1c 6.9   Meds: B12 daily  SS Novolog  0-9 units TID  15 units lantus  nightly   NUTRITION - FOCUSED PHYSICAL EXAM:  Flowsheet Row Most Recent Value  Orbital Region No depletion  Upper Arm Region Mild depletion  Thoracic and Lumbar Region No depletion  Buccal Region No depletion  Temple Region Unable to assess  [EEG nodes]  Clavicle Bone Region No depletion  Clavicle and Acromion Bone Region No depletion  Scapular Bone Region No depletion  Dorsal Hand No depletion  Patellar Region No depletion  Anterior Thigh Region No depletion  Posterior Calf Region No depletion  Hair Unable to assess  [EEG nodes]  Eyes Reviewed  Mouth Reviewed  Skin Reviewed  Nails Reviewed    Diet Order:   Diet Order             Diet Carb  Modified Fluid consistency: Thin; Room service appropriate? Yes  Diet effective now                   EDUCATION NEEDS:   Not appropriate for education at this time  Skin:  Skin Assessment: Reviewed RN Assessment  Last BM:  10/7  Height:   Ht Readings from Last 1 Encounters:  10/20/23 5' 2 (1.575 m)    Weight:   Wt Readings from Last 1 Encounters:  10/20/23 82.1 kg    Ideal Body Weight:  50 kg  BMI:  Body mass index is 33.1 kg/m.  Estimated Nutritional Needs:   Kcal:  1250-1500 kcals  Protein:  50-60 gm  Fluid:  1.2-1.5 L/d  Chaundra Abreu, MS, RD, LDN Clinical Dietitian  Please see AMiON for contact information.

## 2023-10-21 NOTE — Evaluation (Signed)
 Occupational Therapy Evaluation Patient Details Name: Allison Hughes MRN: [id number] DOB: 1951-01-13 Today's Date: 10/21/2023   History of Present Illness   73 yo female found down on floor 10/6. Recent hospitalization 9/5-9/10 found on floor hypoxic with PNA and UTI d/c to [facility].  Currently pt with confusion, paranoid thoughts and forgetfulness. PMH HTN HLD CVA COPD DM2 GIB, Obesity     Clinical Impressions PT admitted with encephalopathy with hypoxia . Pt currently with functional limitiations due to the deficits listed below (see OT problem list). Pt admitted from home with recent d/c from community rehab. Pt demonstrates cognitive deficits and bruising to L foot. Pt required 3L 02 [place] throughout session to keep oxygen saturation 90% or greater.  Pt will benefit from skilled OT to increase their independence and safety with adls and balance to allow discharge skilled inpatient follow up therapy, <3 hours/day. .      If plan is discharge home, recommend the following:   Two people to help with walking and/or transfers;Two people to help with bathing/dressing/bathroom     Functional Status Assessment   Patient has had a recent decline in their functional status and demonstrates the ability to make significant improvements in function in a reasonable and predictable amount of time.     Equipment Recommendations   BSC/3in1;Wheelchair (measurements OT);Wheelchair cushion (measurements OT) (RW)     Recommendations for Other Services   Speech consult;PT consult     Precautions/Restrictions   Precautions Precautions: Fall Recall of Precautions/Restrictions: Impaired Precaution/Restrictions Comments: watch 02 Restrictions Weight Bearing Restrictions Per Provider Order: No Other Position/Activity Restrictions: L ankle bruised, pt does not bearweight through it. pain with eversion of ankle  MD made aware,     Mobility Bed Mobility Overal bed mobility:  Needs Assistance Bed Mobility: Supine to Sit, Sit to Supine     Supine to sit: Total assist, +2 for physical assistance Sit to supine: Total assist, +2 for physical assistance   General bed mobility comments: total A with maximal cuing for participation, in sequencing movement from supine to EoB, ultimately requiring total A for management of LE off bed and trunk to upright, toal A for returning to supine    Transfers Overall transfer level: Needs assistance Equipment used: 1 person hand held assist, 2 person hand held assist Transfers: Sit to/from Stand, Bed to chair/wheelchair/BSC Sit to Stand: Total assist, +2 physical assistance Stand pivot transfers: Total assist, +2 physical assistance         General transfer comment: pt given total Ax2 for coming to upright with B feet blocked, pt with increased R latereal lean to weightbear through R LE once seated on BSC pt elevated L foot in air, total A for stand pivot back to bed      Balance Overall balance assessment: Needs assistance Sitting-balance support: Feet supported, Bilateral upper extremity supported Sitting balance-Leahy Scale: Fair Sitting balance - Comments: able to static sit but given CGA for safety   Standing balance support: Reliant on assistive device for balance, Bilateral upper extremity supported Standing balance-Leahy Scale: Zero Standing balance comment: requires outside assist will not bear weight through L LE due to pain                           ADL either performed or assessed with clinical judgement   ADL Overall ADL's : Needs assistance/impaired  Toilet Transfer: Total assistance;+2 for physical assistance;+2 for safety/equipment Toilet Transfer Details (indicate cue type and reason): not weight bearing on LLE and immediately holding it off the ground. pt does not void while on BSC, purewich with 50 CC in the canister only bed linen dry.                  Vision   Additional Comments: difficult to full assess but does visually scan R and L during session to name call.     Perception         Praxis         Pertinent Vitals/Pain Pain Assessment Pain Assessment: Faces Faces Pain Scale: Hurts even more Pain Location: L ankle Pain Descriptors / Indicators: Grimacing, Moaning, Guarding Pain Intervention(s): Monitored during session, Repositioned, Limited activity within patient's tolerance     Extremity/Trunk Assessment Upper Extremity Assessment Upper Extremity Assessment: Right hand dominant;RUE deficits/detail;LUE deficits/detail RUE Deficits / Details: noted to have a tremor after movement initiated. pt noted to have creatine up on lab chart review. pt unable to verbalize if that was baseline. pt demonstrates increased time adn inability to grasp wash cloth. pt using the back of her hand and wrist and needed extensive time to place in OT hands RUE Coordination: decreased fine motor;decreased gross motor LUE Deficits / Details: noted to have a tremor after movement initiated. pt noted to have creatine up on lab chart review LUE Coordination: decreased fine motor;decreased gross motor   Lower Extremity Assessment Lower Extremity Assessment: Defer to PT evaluation LLE Deficits / Details: L foot dorsum bruised, from 3rd to 5th ray, painful with PROM in flexion and eversion   Cervical / Trunk Assessment Cervical / Trunk Assessment: Kyphotic   Communication Communication Communication: Impaired (per prior notes) Factors Affecting Communication: Hearing impaired   Cognition Arousal: Alert Behavior During Therapy: Flat affect Cognition: Cognition impaired   Orientation impairments: Person, Place, Time, Situation Awareness: Intellectual awareness impaired, Online awareness impaired     Executive functioning impairment (select all impairments): Initiation OT - Cognition Comments: pt visually attends to therapist and name  call. pt unable to state name. pt followed 1 step commands during session. Chart review providing recent home information.                 Following commands: Impaired Following commands impaired: Follows one step commands inconsistently, Follows one step commands with increased time     Cueing  General Comments   Cueing Techniques: Verbal cues;Gestural cues;Tactile cues;Visual cues  Pt on RA on arrival with O2 85% RA. HR 90. Pt provided 1 L 02 [place] and needed increased to 3L 02 [place] to rebound to 90% . Pt upright on BSC 95% HR 100. Pt returning to supine on 3L 02 [place] 92% HR 100 with BLE elevated on pillows   Exercises     Shoulder Instructions      Home Living Family/patient expects to be discharged to:: Private residence Living Arrangements: Alone Available Help at Discharge: Friend(s);Available PRN/intermittently Type of Home: House Home Access: Stairs to enter [employer] of Steps: 3 Entrance Stairs-Rails: Right;Left;Can reach both Home Layout: One level     Bathroom Shower/Tub: Chief Strategy Officer: Standard Bathroom Accessibility: Yes   Home Equipment: Agricultural consultant (2 wheels);Cane - single point;Shower seat;BSC/3in1   Additional Comments: all information from prior admission pt was unable to answer questions this session      Prior Functioning/Environment Prior Level of Function : Independent/Modified Independent;History of  Falls (last six months);Patient poor historian/Family not available             Mobility Comments: per notes from conversation from neighbor recorded in notes pt discharged from SNF being able to ambulate 72 feet      OT Problem List: Decreased strength;Decreased activity tolerance;Impaired balance (sitting and/or standing);Decreased cognition;Decreased safety awareness;Decreased knowledge of use of DME or AE;Decreased knowledge of precautions;Cardiopulmonary status limiting activity   OT  Treatment/Interventions: Self-care/ADL training;Therapeutic exercise;Energy conservation;DME and/or AE instruction;Manual therapy;Modalities;Therapeutic activities;Cognitive remediation/compensation;Patient/family education;Balance training      OT Goals(Current goals can be found in the care plan section)   Acute Rehab OT Goals Patient Stated Goal: none stated OT Goal Formulation: Patient unable to participate in goal setting Time For Goal Achievement: 11/04/23 Potential to Achieve Goals: Fair   OT Frequency:  Min 2X/week    Co-evaluation PT/OT/SLP Co-Evaluation/Treatment: Yes Reason for Co-Treatment: For patient/therapist safety;Necessary to address cognition/behavior during functional activity   OT goals addressed during session: ADL's and self-care      AM-PAC OT 6 Clicks Daily Activity     Outcome Measure Help from another person eating meals?: A Lot Help from another person taking care of personal grooming?: A Lot Help from another person toileting, which includes using toliet, bedpan, or urinal?: A Lot Help from another person bathing (including washing, rinsing, drying)?: A Lot Help from another person to put on and taking off regular upper body clothing?: A Lot Help from another person to put on and taking off regular lower body clothing?: Total 6 Click Score: 11   End of Session Equipment Utilized During Treatment: Gait belt;Oxygen Nurse Communication: Mobility status;Precautions  Activity Tolerance: Patient tolerated treatment well Patient left: in bed;with call bell/phone within reach;with bed alarm set  OT Visit Diagnosis: Unsteadiness on feet (R26.81);Muscle weakness (generalized) (M62.81)                Time: 9082-9057 OT Time Calculation (min): 25 min Charges:  OT General Charges $OT Visit: 1 Visit OT Evaluation $OT Eval Moderate Complexity: 1 Mod   Allison Hughes, OTR/L  Acute Rehabilitation Services Office: [phone] .   Ely Molt 10/21/2023,  11:33 AM

## 2023-10-21 NOTE — Care Management Obs Status (Signed)
 MEDICARE OBSERVATION STATUS NOTIFICATION   Patient Details  Name: Allison Hughes MRN: [id number] Date of Birth: 1950/11/06   Medicare Observation Status Notification Given:      Attempted to reach patient and Manus Cong Significant other at [phone] a copy will be left in the patient room    Laryn Venning 10/21/2023, 2:48 PM

## 2023-10-21 NOTE — Procedures (Signed)
 Patient Name: MCKINZEY ENTWISTLE  MRN: [id number]  Epilepsy Attending: Arlin MALVA Krebs  Referring Physician/Provider: Cherlyn Labella, MD  Date: 10/21/2023 Duration: 22.50 mins  Patient history: 73yo M with ams. EEG to evaluate for seizure  Level of alertness: Awake, asleep  AEDs during EEG study: None  Technical aspects: This EEG study was done with scalp electrodes positioned according to the 10-20 International system of electrode placement. Electrical activity was reviewed with band pass filter of 1-70Hz , sensitivity of 7 uV/mm, display speed of 4mm/sec with a 60Hz  notched filter applied as appropriate. EEG data were recorded continuously and digitally stored.  Video monitoring was available and reviewed as appropriate.  Description: The posterior dominant rhythm consists of 7.5 Hz activity of moderate voltage (25-35 uV) seen predominantly in posterior head regions, symmetric and reactive to eye opening and eye closing. Sleep was characterized by vertex waves, sleep spindles (12 to 14 Hz), maximal frontocentral region. Hyperventilation and photic stimulation were not performed.     IMPRESSION: This study is within normal limits. No seizures or epileptiform discharges were seen throughout the recording.  A normal interictal EEG does not exclude the diagnosis of epilepsy.   Raelene Trew O Christophe Rising

## 2023-10-21 NOTE — Plan of Care (Signed)
  Problem: Education: Goal: Ability to describe self-care measures that may prevent or decrease complications (Diabetes Survival Skills Education) will improve Outcome: Not Progressing Goal: Individualized Educational Video(s) Outcome: Not Progressing   Problem: Coping: Goal: Ability to adjust to condition or change in health will improve Outcome: Not Progressing   Problem: Fluid Volume: Goal: Ability to maintain a balanced intake and output will improve Outcome: Not Progressing   Problem: Health Behavior/Discharge Planning: Goal: Ability to identify and utilize available resources and services will improve Outcome: Not Progressing Goal: Ability to manage health-related needs will improve Outcome: Not Progressing   Problem: Skin Integrity: Goal: Risk for impaired skin integrity will decrease Outcome: Not Progressing   Problem: Nutritional: Goal: Maintenance of adequate nutrition will improve Outcome: Not Progressing Goal: Progress toward achieving an optimal weight will improve Outcome: Not Progressing   Problem: Tissue Perfusion: Goal: Adequacy of tissue perfusion will improve Outcome: Not Progressing   Problem: Education: Goal: Knowledge of General Education information will improve Description: Including pain rating scale, medication(s)/side effects and non-pharmacologic comfort measures Outcome: Not Progressing   Problem: Skin Integrity: Goal: Risk for impaired skin integrity will decrease Outcome: Not Progressing   Problem: Clinical Measurements: Goal: Ability to maintain clinical measurements within normal limits will improve Outcome: Not Progressing Goal: Will remain free from infection Outcome: Not Progressing Goal: Diagnostic test results will improve Outcome: Not Progressing Goal: Respiratory complications will improve Outcome: Not Progressing Goal: Cardiovascular complication will be avoided Outcome: Not Progressing   Problem: Pain Managment: Goal:  General experience of comfort will improve and/or be controlled Outcome: Not Progressing

## 2023-10-21 NOTE — Progress Notes (Signed)
 EEG complete, results are pending.

## 2023-10-21 NOTE — Evaluation (Signed)
 Physical Therapy Evaluation Patient Details Name: Allison Hughes MRN: [id number] DOB: 1950-12-13 Today's Date: 10/21/2023  History of Present Illness  73 yo female found down on floor 10/6. Recent hospitalization 9/5-9/10 found on floor hypoxic with PNA and UTI d/c to [facility].  Currently pt with confusion, paranoid thoughts and forgetfulness. PMH HTN HLD CVA COPD DM2 GIB, Obesity  Clinical Impression  Pt poor historian, responds to her name but otherwise not oriented. PLOF and Home set up collected from prior hospitalization and report from neighbor in H&P from this hospitalization. Pt lives in single story home with 3 steps to enter. Pt with history of AMS and falls in presence of hypoxia. Post last hospitalization went to SNF where it was reported that she was able to ambulate 72 feet with RW. Per neighbor who spends day with her she was not able to ambulate when she returned home and neighbor had difficulty getting her to take her medications. Pt on RA on entry and SpO2 found to be 85%O2. Pt provided 3L O2 via [place] and pt able to maintain SpO2 >90%O2 with movement. Pt limited in safe mobility by AMS, decreased command follow, bruising and swelling in dorsum of L foot and associated pain with ankle flexion and eversion and weight bearing. Pt is total Ax2 for bed mobility and transfers. Patient will benefit from continued inpatient follow up therapy, <3 hours/day. PT will continue to follow acutely.           If plan is discharge home, recommend the following: A lot of help with walking and/or transfers;A lot of help with bathing/dressing/bathroom;Assistance with cooking/housework;Direct supervision/assist for medications management;Direct supervision/assist for financial management;Assist for transportation;Assistance with feeding;Help with stairs or ramp for entrance;Supervision due to cognitive status   Can travel by private vehicle   No    Equipment Recommendations None  recommended by PT  Recommendations for Other Services       Functional Status Assessment Patient has had a recent decline in their functional status and demonstrates the ability to make significant improvements in function in a reasonable and predictable amount of time.     Precautions / Restrictions Precautions Precautions: Fall Recall of Precautions/Restrictions: Impaired Precaution/Restrictions Comments: AMS Restrictions Weight Bearing Restrictions Per Provider Order: No Other Position/Activity Restrictions: L ankle bruised, pt does not bearweight through it MD made aware,      Mobility  Bed Mobility Overal bed mobility: Needs Assistance Bed Mobility: Supine to Sit, Sit to Supine     Supine to sit: Total assist, +2 for physical assistance Sit to supine: Total assist, +2 for physical assistance   General bed mobility comments: total A with maximal cuing for participation, in sequencing movement from supine to EoB, ultimately requiring total A for management of LE off bed and trunk to upright, toal A for returning to supine    Transfers Overall transfer level: Needs assistance Equipment used: 1 person hand held assist, 2 person hand held assist Transfers: Sit to/from Stand, Bed to chair/wheelchair/BSC Sit to Stand: Total assist, +2 physical assistance Stand pivot transfers: Total assist, +2 physical assistance         General transfer comment: pt given total Ax2 for coming to upright with B feet blocked, pt with increased R latereal lean to weightbear through R LE once seated on BSC pt elevated L foot in air, total A for stand pivot back to bed    Ambulation/Gait  General Gait Details: unable         Balance Overall balance assessment: Needs assistance Sitting-balance support: Feet supported, Bilateral upper extremity supported Sitting balance-Leahy Scale: Fair Sitting balance - Comments: able to static sit but given CGA for safety    Standing balance support: Reliant on assistive device for balance, Bilateral upper extremity supported Standing balance-Leahy Scale: Zero Standing balance comment: requires outside assist will not bear weight through L LE due to pain                             Pertinent Vitals/Pain Pain Assessment Pain Assessment: Faces Faces Pain Scale: Hurts even more Pain Location: L ankle Pain Descriptors / Indicators: Grimacing, Moaning, Guarding Pain Intervention(s): Limited activity within patient's tolerance, Monitored during session, Repositioned    Home Living Family/patient expects to be discharged to:: Private residence Living Arrangements: Alone Available Help at Discharge: Friend(s);Available PRN/intermittently Type of Home: House Home Access: Stairs to enter Entrance Stairs-Rails: Right;Left;Can reach both Entrance Stairs-Number of Steps: 3   Home Layout: One level Home Equipment: Agricultural consultant (2 wheels);Cane - single point;Shower seat;BSC/3in1      Prior Function Prior Level of Function : Independent/Modified Independent;History of Falls (last six months);Patient poor historian/Family not available             Mobility Comments: per notes from conversation from neighbor recorded in notes pt discharged from SNF being able to ambulate 72 feet       Extremity/Trunk Assessment   Upper Extremity Assessment Upper Extremity Assessment: Defer to OT evaluation    Lower Extremity Assessment Lower Extremity Assessment: LLE deficits/detail;Difficult to assess due to impaired cognition;Generalized weakness LLE Deficits / Details: L foot dorsum bruised, from 3rd to 5th ray, painful with PROM in flexion and eversion    Cervical / Trunk Assessment Cervical / Trunk Assessment: Kyphotic  Communication   Communication Communication: Impaired (per prior notes) Factors Affecting Communication: Hearing impaired    Cognition Arousal: Alert Behavior During Therapy:  Flat affect   PT - Cognitive impairments: No family/caregiver present to determine baseline, History of cognitive impairments                       PT - Cognition Comments: oriented to her name being called does not verbalize her name Following commands: Impaired Following commands impaired: Follows one step commands inconsistently, Follows one step commands with increased time     Cueing Cueing Techniques: Verbal cues, Gestural cues, Tactile cues, Visual cues     General Comments General comments (skin integrity, edema, etc.): Pt on RA on entry SpO2 85%O68m HR 90 bpm, Provided 1L via [place] and SpO2 rebounded to 90%O2 in supine, increased to 3L in sitting on BSC SpO2 95%O2, HR 90%, with return to supine SpO2 on 3L O2 92%O2, HR 100 bpm, L foot noted to have increased edema and bruising on dorsum, pt also noted to have UE and torso tremoring with sitting on [facility]  MD aware        Assessment/Plan    PT Assessment Patient needs continued PT services  PT Problem List Decreased strength;Decreased activity tolerance;Decreased balance;Decreased mobility;Decreased range of motion;Decreased coordination;Decreased cognition;Decreased safety awareness;Pain       PT Treatment Interventions DME instruction;Gait training;Functional mobility training;Therapeutic activities;Therapeutic exercise;Balance training;Cognitive remediation;Patient/family education    PT Goals (Current goals can be found in the Care Plan section)  Acute Rehab PT Goals PT Goal Formulation: Patient unable  to participate in goal setting Time For Goal Achievement: 11/04/23 Potential to Achieve Goals: Fair    Frequency Min 2X/week        AM-PAC PT 6 Clicks Mobility  Outcome Measure Help needed turning from your back to your side while in a flat bed without using bedrails?: A Lot Help needed moving from lying on your back to sitting on the side of a flat bed without using bedrails?: Total Help needed moving to and  from a bed to a chair (including a wheelchair)?: Total Help needed standing up from a chair using your arms (e.g., wheelchair or bedside chair)?: Total Help needed to walk in hospital room?: Total Help needed climbing 3-5 steps with a railing? : Total 6 Click Score: 7    End of Session Equipment Utilized During Treatment: Oxygen Activity Tolerance: Patient limited by pain;Treatment limited secondary to medical complications (Comment) (painful L foot with weightbearing) Patient left: in bed;with call bell/phone within reach;with bed alarm set;Other (comment) (MD in room)   PT Visit Diagnosis: Muscle weakness (generalized) (M62.81);Repeated falls (R29.6);History of falling (Z91.81);Difficulty in walking, not elsewhere classified (R26.2);Pain Pain - Right/Left: Left Pain - part of body: Ankle and joints of foot    Time: 9081-9056 PT Time Calculation (min) (ACUTE ONLY): 25 min   Charges:   PT Evaluation $PT Eval Moderate Complexity: 1 Mod   PT General Charges $$ ACUTE PT VISIT: 1 Visit         Allison Hughes PT, DPT Acute Rehabilitation Services Please use secure chat or  Call Office [phone]   Allison Hughes 10/21/2023, 10:49 AM

## 2023-10-21 NOTE — Progress Notes (Addendum)
 [facility]                                                                               Allison Hughes, is a 73 y.o. female, DOB - [birth year], FMW:[id number] Admit date - 10/20/2023    Outpatient Primary MD for the patient is Valma Carwin, MD  LOS - 0  days    Brief summary   Allison Hughes is a 73 y.o. female with medical history significant of hypertension, hyperlipidemia, CVA, COPD, diabetes mellitus type 2, GI bleed, neuroendocrine ulcer, and obesity who presents after being found on the floor confused   Assessment & Plan    Assessment and Plan:  Acute metabolic encephalopathy:  In the setting of baseline cognitive impairment. CT head is negative for acute intracranial abnormality.  MRI brain showing  No acute intracranial abnormality. Old left cerebellar infarct.  Early confluent white matter T2 hyperintensity consistent with chronic small vessel disease.Generalized cerebral volume loss. EEG did not show any seizures.  Suspect from UTI Continue with IV ceftriaxone . Follow up urine cultures.     Acute hypoxic respiratory failure secondary to COPD Currently requiring about 3 lit of  OXYGEN.  Check covid, influenza pcr and RSV  Check respiratory panel.  Continue with bronchodilators.    Left foot and ankle pain and some swelling on the dorsum of the foot X rays of the left foot ordered for further evaluation.    Hypokalemia and hypomagnesemia Replaced, repeat levels in am.    H/o CVA  Therapy eval.    GERD Continue with PPI.   Body mass index is 33.1 kg/m. Obesity.    Hyperlipidemia Resume statin    Controlled type 2 DM  CBG (last 3)  Recent Labs    10/20/23 2213 10/21/23 0804 10/21/23 1211  GLUCAP 260* 191* 203*   Resume SSI.  A1c is 6.9%    RN Pressure Injury Documentation:    Malnutrition Type:  Nutrition Problem: Altered nutrition lab value Etiology: chronic illness ((diabetes))   Malnutrition  Characteristics:  Signs/Symptoms:  (A1c 6.9, CBGs > 180)   Nutrition Interventions:     Estimated body mass index is 33.1 kg/m as calculated from the following:   Height as of this encounter: 5' 2 (1.575 m).   Weight as of this encounter: 82.1 kg.  Code Status: full code.  DVT Prophylaxis:  enoxaparin  (LOVENOX ) injection 40 mg Start: 10/20/23 1800   Level of Care: Level of care: Telemetry Cardiac Family Communication: none at bedside.   Disposition Plan:     Remains inpatient appropriate:  pending clinical improvement.   Procedures:  EEG  Consultants:   Palliative care  Antimicrobials:   Anti-infectives (From admission, onward)    Start     Dose/Rate Route Frequency Ordered Stop   10/21/23 1000  cefTRIAXone  (ROCEPHIN ) 1 g in sodium chloride  0.9 % 100 mL IVPB        1 g 200 mL/hr over 30 Minutes Intravenous Every 24 hours 10/21/23 0110     10/20/23 1230  cefTRIAXone  (ROCEPHIN ) 1 g in sodium chloride  0.9 % 100 mL IVPB        1 g 200 mL/hr over 30  Minutes Intravenous  Once 10/20/23 1220 10/20/23 1338        Medications  Scheduled Meds:  atorvastatin   40 mg Oral Daily   budesonide -glycopyrrolate -formoterol   2 puff Inhalation BID   vitamin B-12  500 mcg Oral Daily   enoxaparin  (LOVENOX ) injection  40 mg Subcutaneous Q24H   haloperidol  lactate  1 mg Intravenous Once   insulin  aspart  0-9 Units Subcutaneous TID WC   insulin  glargine  15 Units Subcutaneous QHS   lisinopril   20 mg Oral QHS   potassium chloride   40 mEq Oral BID   Ensure Max Protein  11 oz Oral BID   sodium chloride  flush  3 mL Intravenous Q12H   Continuous Infusions:  sodium chloride  75 mL/hr at 10/21/23 1158   cefTRIAXone  (ROCEPHIN )  IV 1 g (10/21/23 0842)   PRN Meds:.acetaminophen  **OR** acetaminophen , albuterol , ALPRAZolam , ondansetron  **OR** ondansetron  (ZOFRAN ) IV    Subjective:   Allison Hughes was seen and examined today.  Pt is alert and oriented to person only.  No in distress.  Not answering any other questions.   Objective:   Vitals:   10/21/23 0707 10/21/23 0808 10/21/23 1011 10/21/23 1211  BP:  (P) 124/65 (!) 133/59 118/63  Pulse: 77 (P) 96  87  Resp: 18 (P) 17    Temp:  (P) 98.7 F (37.1 C)  98 F (36.7 C)  TempSrc:  (P) Axillary    SpO2: 96% (!) (P) 87% 96% 94%  Weight:      Height:        Intake/Output Summary (Last 24 hours) at 10/21/2023 1417 Last data filed at 10/21/2023 0700 Gross per 24 hour  Intake 850 ml  Output 700 ml  Net 150 ml   Filed Weights   10/20/23 0929 10/20/23 0933  Weight: 82.1 kg 82.1 kg     Exam General exam: elderly woman, not in distress.  Respiratory system: Clear to auscultation. Respiratory effort normal. Cardiovascular system: S1 & S2 heard, RRR. No JVD,  Gastrointestinal system: Abdomen is nondistended, soft and nontender.  Central nervous system: Alert and oriented to person only. She was able to tell me her name only.  Extremities: Symmetric 5 x 5 power. Skin: No rashes,  Psychiatry:  unable to assess.     Data Reviewed:  I have personally reviewed following labs and imaging studies   CBC Lab Results  Component Value Date   WBC 6.6 10/21/2023   RBC 4.95 10/21/2023   HGB 11.1 (L) 10/21/2023   HCT 36.6 10/21/2023   MCV 73.9 (L) 10/21/2023   MCH 22.4 (L) 10/21/2023   PLT 259 10/21/2023   MCHC 30.3 10/21/2023   RDW 23.5 (H) 10/21/2023   LYMPHSABS 1.3 10/20/2023   MONOABS 0.1 10/20/2023   EOSABS 0.3 10/20/2023   BASOSABS 0.0 10/20/2023     Last metabolic panel Lab Results  Component Value Date   NA 139 10/21/2023   K 2.9 (L) 10/21/2023   CL 105 10/21/2023   CO2 26 10/21/2023   BUN 8 10/21/2023   CREATININE 0.69 10/21/2023   GLUCOSE 234 (H) 10/21/2023   GFRNONAA >60 10/21/2023   GFRAA >60 02/05/2016   CALCIUM  8.8 (L) 10/21/2023   PHOS 2.9 09/23/2023   PROT 5.6 (L) 10/21/2023   ALBUMIN 2.6 (L) 10/21/2023   BILITOT 0.5 10/21/2023   ALKPHOS 92 10/21/2023   AST 25 10/21/2023   ALT 25  10/21/2023   ANIONGAP 8 10/21/2023    CBG (last 3)  Recent Labs  10/20/23 2213 10/21/23 0804 10/21/23 1211  GLUCAP 260* 191* 203*      Coagulation Profile: No results for input(s): INR, PROTIME in the last 168 hours.   Radiology Studies: EEG adult Result Date: 10/21/2023 Allison Arlin KIDD, MD     10/21/2023  1:18 PM Patient Name: Allison Hughes MRN: [id number] Epilepsy Attending: Arlin Hughes Allison Referring Physician/Provider: Cherlyn Labella, MD Date: 10/21/2023 Duration: 22.50 mins Patient history: 73yo M with ams. EEG to evaluate for seizure Level of alertness: Awake, asleep AEDs during EEG study: None Technical aspects: This EEG study was done with scalp electrodes positioned according to the 10-20 International system of electrode placement. Electrical activity was reviewed with band pass filter of 1-70Hz , sensitivity of 7 uV/mm, display speed of 70mm/sec with a 60Hz  notched filter applied as appropriate. EEG data were recorded continuously and digitally stored.  Video monitoring was available and reviewed as appropriate. Description: The posterior dominant rhythm consists of 7.5 Hz activity of moderate voltage (25-35 uV) seen predominantly in posterior head regions, symmetric and reactive to eye opening and eye closing. Sleep was characterized by vertex waves, sleep spindles (12 to 14 Hz), maximal frontocentral region. Hyperventilation and photic stimulation were not performed.   IMPRESSION: This study is within normal limits. No seizures or epileptiform discharges were seen throughout the recording. A normal interictal EEG does not exclude the diagnosis of epilepsy. Arlin Hughes Allison   MR BRAIN WO CONTRAST Result Date: 10/20/2023 EXAM: MRI BRAIN WITHOUT CONTRAST 10/20/2023 09:20:29 PM TECHNIQUE: Multiplanar multisequence MRI of the head/brain was performed without the administration of intravenous contrast. COMPARISON: 06/04/2022 CLINICAL HISTORY: Stroke, follow up. Altered mental  status, stroke, weakness. FINDINGS: BRAIN AND VENTRICLES: No acute infarct. No intracranial hemorrhage. No mass. No midline shift. No hydrocephalus. Old Left cerebellar infarct. Early confluent hyperintense T2-weighted signal within the cerebral white matter, most commonly due to chronic small vessel disease. Generalized volume loss. The sella is unremarkable. Normal flow voids. ORBITS: No acute abnormality. SINUSES AND MASTOIDS: No acute abnormality. BONES AND SOFT TISSUES: Normal marrow signal. No acute soft tissue abnormality. IMPRESSION: 1. No acute intracranial abnormality. 2. Old left cerebellar infarct. 3. Early confluent white matter T2 hyperintensity consistent with chronic small vessel disease. 4. Generalized cerebral volume loss. Electronically signed by: Franky Stanford MD 10/20/2023 11:29 PM EDT RP Workstation: [id number]   DG Abd 1 View Result Date: 10/20/2023 CLINICAL DATA:  Evaluate for metallic foreign body EXAM: ABDOMEN - 1 VIEW COMPARISON:  None Available. FINDINGS: Scattered large and small bowel gas is noted. No abnormal mass or abnormal calcifications are noted. Loop recorder is noted in the anterior left chest wall. No other metallic foreign body is seen. IMPRESSION: No foreign body within the abdomen. Loop recorder is noted left chest. Electronically Signed   By: Oneil Devonshire M.D.   On: 10/20/2023 21:20   CT Cervical Spine Wo Contrast Result Date: 10/20/2023 EXAM: CT CERVICAL SPINE WITHOUT CONTRAST 10/20/2023 10:54:35 AM TECHNIQUE: CT of the cervical spine was performed without the administration of intravenous contrast. Multiplanar reformatted images are provided for review. Automated exposure control, iterative reconstruction, and/or weight based adjustment of the mA/kV was utilized to reduce the radiation dose to as low as reasonably achievable. COMPARISON: None available. CLINICAL HISTORY: Neck trauma (Age >= 65y). Mental status change, unknown cause. FINDINGS: CERVICAL SPINE: BONES  AND ALIGNMENT: No acute fracture or traumatic malalignment. DEGENERATIVE CHANGES: Moderate chronic degenerative disc disease at C4-C5 and C5-C6, with posterior endplate ridging causing moderate central spinal canal stenosis  at C5-C6 with severe bilateral neuroforaminal stenosis. There is also moderate central spinal canal stenosis and moderate-to-severe bilateral neural foraminal stenosis at C6-C7. SOFT TISSUES: No prevertebral soft tissue swelling. There is calcification within the carotid bulbs. There is mild emphysema within the lung apices. IMPRESSION: 1. No acute abnormality of the cervical spine related to the reported neck trauma. 2. Moderate chronic degenerative disc disease at C4-5 and C5-6, with associated moderate central spinal canal stenosis at C5-6 and severe bilateral neuroforaminal stenosis. 3. Moderate central spinal canal stenosis and moderate-to-severe bilateral neural foraminal stenosis at C6-7. Electronically signed by: Evalene Coho MD 10/20/2023 11:09 AM EDT RP Workstation: [id number]   CT Head Wo Contrast Result Date: 10/20/2023 EXAM: CT HEAD WITHOUT CONTRAST 10/20/2023 10:54:35 AM TECHNIQUE: CT of the head was performed without the administration of intravenous contrast. Automated exposure control, iterative reconstruction, and/or weight based adjustment of the mA/kV was utilized to reduce the radiation dose to as low as reasonably achievable. COMPARISON: CT of the head dated 09/19/2023. CLINICAL HISTORY: Mental status change, unknown cause. FINDINGS: BRAIN AND VENTRICLES: No acute hemorrhage. No evidence of acute infarct. No hydrocephalus. No extra-axial collection. No mass effect or midline shift. There is moderate generalized cerebral volume loss. There are chronic encephalomalacia changes again noted within the right parietal lobe. Focal encephalomalacia changes are also again demonstrated in the left cerebellar hemisphere. There is moderately advanced diffuse cerebral white matter  disease. Senescent calcifications are noted within the basal ganglia. There is moderate calcification within the carotid siphons. ORBITS: No acute abnormality. SINUSES: No acute abnormality. SOFT TISSUES AND SKULL: No acute soft tissue abnormality. No skull fracture. IMPRESSION: 1. No acute intracranial abnormality. 2. Moderate generalized cerebral volume loss and moderately advanced diffuse cerebral white matter disease. 3. 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 Electronically signed by: Evalene Coho MD 10/20/2023 11:06 AM EDT RP Workstation: [id number]   DG Chest Portable 1 View Result Date: 10/20/2023 EXAM: 1 VIEW XRAY OF THE CHEST 10/20/2023 10:09:00 AM COMPARISON: 09/19/2023 CLINICAL HISTORY: AMS. AMS- 3-4 weeks per family. FINDINGS: LINES, TUBES AND DEVICES: Left chest loop recorder device in place. LUNGS AND PLEURA: Decreased mild pulmonary edema. No focal pulmonary opacity. No pleural effusion. No pneumothorax. HEART AND MEDIASTINUM: No acute abnormality of the cardiac and mediastinal silhouettes. BONES AND SOFT TISSUES: No acute osseous abnormality. IMPRESSION: 1. Decreased mild pulmonary edema. Electronically signed by: Evalene Coho MD 10/20/2023 10:34 AM EDT RP Workstation: [id number]       Elgie Butter M.D. [facility] 10/21/2023, 2:17 PM  Available via Epic secure chat 7am-7pm After 7 pm, please refer to night coverage provider listed on amion.

## 2023-10-22 DIAGNOSIS — R4182 Altered mental status, unspecified: Secondary | ICD-10-CM | POA: Diagnosis not present

## 2023-10-22 DIAGNOSIS — Z7189 Other specified counseling: Secondary | ICD-10-CM | POA: Diagnosis not present

## 2023-10-22 DIAGNOSIS — G9341 Metabolic encephalopathy: Secondary | ICD-10-CM | POA: Diagnosis not present

## 2023-10-22 DIAGNOSIS — Z515 Encounter for palliative care: Secondary | ICD-10-CM | POA: Diagnosis not present

## 2023-10-22 LAB — COMPREHENSIVE METABOLIC PANEL WITH GFR
ALT: 23 U/L (ref 0–44)
AST: 24 U/L (ref 15–41)
Albumin: 2.6 g/dL — ABNORMAL LOW (ref 3.5–5.0)
Alkaline Phosphatase: 105 U/L (ref 38–126)
Anion gap: 10 (ref 5–15)
BUN: 10 mg/dL (ref 8–23)
CO2: 25 mmol/L (ref 22–32)
Calcium: 8.7 mg/dL — ABNORMAL LOW (ref 8.9–10.3)
Chloride: 105 mmol/L (ref 98–111)
Creatinine, Ser: 0.64 mg/dL (ref 0.44–1.00)
GFR, Estimated: >60 mL/min (ref >60)
Glucose, Bld: 207 mg/dL — ABNORMAL HIGH (ref 70–99)
Potassium: 3.7 mmol/L (ref 3.5–5.1)
Sodium: 140 mmol/L (ref 135–145)
Total Bilirubin: 0.3 mg/dL (ref 0.0–1.2)
Total Protein: 6 g/dL — ABNORMAL LOW (ref 6.5–8.1)

## 2023-10-22 LAB — CBC WITH DIFFERENTIAL/PLATELET
Abs Immature Granulocytes: 0.02 K/uL (ref 0.00–0.07)
Basophils Absolute: 0.1 K/uL (ref 0.0–0.1)
Basophils Relative: 1 %
Eosinophils Absolute: 0.2 K/uL (ref 0.0–0.5)
Eosinophils Relative: 3 %
HCT: 37.5 % (ref 36.0–46.0)
Hemoglobin: 11.3 g/dL — ABNORMAL LOW (ref 12.0–15.0)
Immature Granulocytes: 0 %
Lymphocytes Relative: 25 %
Lymphs Abs: 1.4 K/uL (ref 0.7–4.0)
MCH: 22.4 pg — ABNORMAL LOW (ref 26.0–34.0)
MCHC: 30.1 g/dL (ref 30.0–36.0)
MCV: 74.4 fL — ABNORMAL LOW (ref 80.0–100.0)
Monocytes Absolute: 0.5 K/uL (ref 0.1–1.0)
Monocytes Relative: 9 %
Neutro Abs: 3.4 K/uL (ref 1.7–7.7)
Neutrophils Relative %: 62 %
Platelets: 281 K/uL (ref 150–400)
RBC: 5.04 MIL/uL (ref 3.87–5.11)
RDW: 23.7 % — ABNORMAL HIGH (ref 11.5–15.5)
Smear Review: NORMAL
WBC: 5.5 K/uL (ref 4.0–10.5)
nRBC: 0 % (ref 0.0–0.2)

## 2023-10-22 LAB — GLUCOSE, CAPILLARY
Glucose-Capillary: 246 mg/dL — ABNORMAL HIGH (ref 70–99)
Glucose-Capillary: 343 mg/dL — ABNORMAL HIGH (ref 70–99)
Glucose-Capillary: 282 mg/dL — ABNORMAL HIGH (ref 70–99)
Glucose-Capillary: 263 mg/dL — ABNORMAL HIGH (ref 70–99)

## 2023-10-22 LAB — CUP PACEART REMOTE DEVICE CHECK
Date Time Interrogation Session: 20251006231626
Implantable Pulse Generator Implant Date: 20220826

## 2023-10-22 LAB — C-REACTIVE PROTEIN: CRP: 2.4 mg/dL — ABNORMAL HIGH (ref <1.0)

## 2023-10-22 LAB — D-DIMER, QUANTITATIVE: D-Dimer, Quant: 1.05 ug{FEU}/mL — ABNORMAL HIGH (ref 0.00–0.50)

## 2023-10-22 MED ORDER — DEXAMETHASONE SODIUM PHOSPHATE 10 MG/ML IJ SOLN
6.0000 mg | Freq: Every day | INTRAMUSCULAR | Status: DC
  Administered 2023-10-22 – 2023-10-23 (×2): 6 mg via INTRAVENOUS
  Filled 2023-10-22 (×2): qty 1

## 2023-10-22 MED ORDER — HALOPERIDOL LACTATE 5 MG/ML IJ SOLN
1.0000 mg | Freq: Four times a day (QID) | INTRAMUSCULAR | Status: DC | PRN
  Administered 2023-10-23 – 2023-11-02 (×9): 1 mg via INTRAVENOUS
  Filled 2023-10-22 (×9): qty 1
  Filled 2023-10-22: qty 0.2

## 2023-10-22 MED ORDER — HALOPERIDOL LACTATE 5 MG/ML IJ SOLN
1.0000 mg | Freq: Once | INTRAMUSCULAR | Status: AC | PRN
  Administered 2023-10-22: 1 mg via INTRAVENOUS
  Filled 2023-10-22: qty 1

## 2023-10-22 NOTE — Progress Notes (Signed)
 PROGRESS NOTE    Allison Hughes  FMW:[id number] DOB: [birth year] DOA: 10/20/2023 PCP: Valma Carwin, MD   Brief Narrative:    73 y.o. female with medical history significant of hypertension, hyperlipidemia, CVA, COPD, diabetes mellitus type 2, GI bleed, neuroendocrine ulcer, and obesity who presents after being found on the floor confused she is being treated for acute metabolic encephalopathy in the setting of urinary tract infection, acute hypoxic respiratory failure secondary to COVID-19 infection and electrolytes are being repleted.  Assessment & Plan:  Principal Problem:   Acute metabolic encephalopathy Active Problems:   Urinary tract infection   Acute respiratory failure with hypoxia (HCC)   Chronic diastolic CHF (congestive heart failure) (HCC)   Fall at home, initial encounter   Essential hypertension   DM (diabetes mellitus), type 2 (HCC)   Hyperlipidemia   History of stroke   History of gastric ulcer   Acute metabolic encephalopathy:  In the setting of baseline cognitive impairment. CT head is negative for acute intracranial abnormality.  MRI brain showing No acute intracranial abnormality. Old left cerebellar infarct. Early confluent white matter T2 hyperintensity consistent with chronic small vessel disease.Generalized cerebral volume loss. EEG did not show any seizures.  Suspect from UTI Continue with IV ceftriaxone . Follow up urine cultures.        Acute hypoxic respiratory failure secondary to COVID Currently, requiring about 3 L of [place] oxygen.  Positive for COVID-19 infection -Continue with corticosteroids -Initial bronchodilator therapy as needed     Left foot and ankle pain and some swelling on the dorsum of the foot X rays of the left foot showed evidence of soft tissue swelling without any acute fractures.   Hypokalemia and hypomagnesemia As needed repletion    H/o CVA  Continue with PT and OT     GERD Continue with PPI.    Body mass index is  33.1 kg/m. Obesity.      Hyperlipidemia Resumed statin      Controlled type 2 DM  Resume SSI.  A1c is 6.9% Diabetes coordinator on board, appreciate assistance   Disposition: Patient was living at home by herself.  PT/OT consulted.  She may need skilled nursing facility upon discharge.  DVT prophylaxis: enoxaparin  (LOVENOX ) injection 40 mg Start: 10/20/23 1800     Code Status: Full Code Family Communication:  None at the bedside Status is: Inpatient Remains inpatient appropriate because: encephalopathy, COVID    Subjective:  She appears confused and disoriented. She was able to tell me her name, DOB and name of the [place]  President correctly. She doesn't know the reason of her hospitalization. She said that she lives at home by herself.  Examination:  General exam: Appears confused and disoriented but not in any acute distress Respiratory system: Slightly coarse breath sounds bilaterally Cardiovascular system: S1 & S2 heard, RRR. No JVD, murmurs, rubs, gallops or clicks. No pedal edema. Gastrointestinal system: Abdomen is nondistended, soft and nontender. No organomegaly or masses felt. Normal bowel sounds heard. Central nervous system: Confused and disoriented. No focal neurological deficits. Extremities: Symmetric 5 x 5 power. Skin: No rashes, lesions or ulcers Psychiatry: confused and disoriented     Diet Orders (From admission, onward)     Start     Ordered   10/20/23 1617  Diet Carb Modified Fluid consistency: Thin; Room service appropriate? Yes  Diet effective now       Question Answer Comment  Diet-HS Snack? Nothing   Calorie Level Medium 1600-2000   Fluid consistency: Thin  Room service appropriate? Yes      10/20/23 1616            Objective: Vitals:   10/21/23 2051 10/22/23 0034 10/22/23 0505 10/22/23 0756  BP:  (!) 151/75 (!) 154/75 (!) 137/97  Pulse: 89 84 79 91  Resp: 16 16 18    Temp:   (!) 97.5 F (36.4 C) 98 F (36.7 C)  TempSrc:    Oral   SpO2: 94% 95% 100% 93%  Weight:      Height:        Intake/Output Summary (Last 24 hours) at 10/22/2023 1046 Last data filed at 10/22/2023 0600 Gross per 24 hour  Intake 1861.3 ml  Output 1500 ml  Net 361.3 ml   Filed Weights   10/20/23 0929 10/20/23 0933  Weight: 82.1 kg 82.1 kg    Scheduled Meds:  atorvastatin   40 mg Oral Daily   budesonide -glycopyrrolate -formoterol   2 puff Inhalation BID   vitamin B-12  500 mcg Oral Daily   dexamethasone (DECADRON) injection  6 mg Intravenous Daily   enoxaparin  (LOVENOX ) injection  40 mg Subcutaneous Q24H   haloperidol  lactate  1 mg Intravenous Once   insulin  aspart  0-9 Units Subcutaneous TID WC   insulin  glargine  15 Units Subcutaneous QHS   lisinopril   20 mg Oral QHS   Ensure Max Protein  11 oz Oral BID   sodium chloride  flush  3 mL Intravenous Q12H   Continuous Infusions:  sodium chloride  75 mL/hr at 10/22/23 0443   cefTRIAXone  (ROCEPHIN )  IV Stopped (10/21/23 0916)    Nutritional status Signs/Symptoms:  (A1c 6.9, CBGs > 180)   Body mass index is 33.1 kg/m.  Data Reviewed:   CBC: Recent Labs  Lab 10/20/23 1059 10/20/23 1119 10/20/23 1127 10/21/23 0445 10/22/23 0400  WBC  --  6.7  --  6.6 5.5  NEUTROABS  --  5.0  --   --  3.4  HGB 12.6 13.3 15.0 11.1* 11.3*  HCT 37.0 45.0 44.0 36.6 37.5  MCV  --  74.9*  --  73.9* 74.4*  PLT  --  255  --  259 281   Basic Metabolic Panel: Recent Labs  Lab 10/20/23 1059 10/20/23 1127 10/20/23 1217 10/21/23 0445 10/22/23 0400  NA 141 139 139 139 140  K 3.1* 4.3 3.5 2.9* 3.7  CL  --  104 103 105 105  CO2  --   --  23 26 25   GLUCOSE  --  204* 173* 234* 207*  BUN  --  9 8 8 10   CREATININE  --  0.50 0.53 0.69 0.64  CALCIUM   --   --  9.1 8.8* 8.7*  MG  --   --   --  1.4*  --    GFR: Estimated Creatinine Clearance: 63.1 mL/min (by C-G formula based on SCr of 0.64 mg/dL). Liver Function Tests: Recent Labs  Lab 10/20/23 1217 10/21/23 0445 10/22/23 0400  AST 30 25 24    ALT 24 25 23   ALKPHOS 112 92 105  BILITOT 0.8 0.5 0.3  PROT 6.3* 5.6* 6.0*  ALBUMIN 2.9* 2.6* 2.6*   No results for input(s): LIPASE, AMYLASE in the last 168 hours. Recent Labs  Lab 10/20/23 1821  AMMONIA 28   Coagulation Profile: No results for input(s): INR, PROTIME in the last 168 hours. Cardiac Enzymes: Recent Labs  Lab 10/20/23 1217  CKTOTAL 179   BNP (last 3 results) No results for input(s): PROBNP in the last 8760 hours. HbA1C:  No results for input(s): HGBA1C in the last 72 hours. CBG: Recent Labs  Lab 10/21/23 0804 10/21/23 1211 10/21/23 1552 10/21/23 1953 10/22/23 0754  GLUCAP 191* 203* 172* 260* 246*   Lipid Profile: No results for input(s): CHOL, HDL, LDLCALC, TRIG, CHOLHDL, LDLDIRECT in the last 72 hours. Thyroid Function Tests: Recent Labs    10/21/23 1031  TSH 1.292   Anemia Panel: Recent Labs    10/21/23 1031  VITAMINB12 440   Sepsis Labs: Recent Labs  Lab 10/20/23 1821  PROCALCITON <0.10    Recent Results (from the past 240 hours)  SARS Coronavirus 2 by RT PCR (hospital order, performed in [facility] hospital lab) *cepheid single result test* Anterior Nasal Swab     Status: Abnormal   Collection Time: 10/21/23  2:50 PM   Specimen: Anterior Nasal Swab  Result Value Ref Range Status   SARS Coronavirus 2 by RT PCR POSITIVE (A) NEGATIVE Final    Comment: Performed at [facility], [address], [place], [place] [address]  Respiratory (~20 pathogens) panel by PCR     Status: None   Collection Time: 10/21/23  2:50 PM   Specimen: Nasopharyngeal Swab; Respiratory  Result Value Ref Range Status   Adenovirus NOT DETECTED NOT DETECTED Final   Coronavirus 229E NOT DETECTED NOT DETECTED Final    Comment: (NOTE) The Coronavirus on the Respiratory Panel, DOES NOT test for the novel  Coronavirus (2019 nCoV)    Coronavirus HKU1 NOT DETECTED NOT DETECTED Final   Coronavirus NL63 NOT DETECTED NOT DETECTED Final    Coronavirus OC43 NOT DETECTED NOT DETECTED Final   Metapneumovirus NOT DETECTED NOT DETECTED Final   Rhinovirus / Enterovirus NOT DETECTED NOT DETECTED Final   Influenza A NOT DETECTED NOT DETECTED Final   Influenza B NOT DETECTED NOT DETECTED Final   Parainfluenza Virus 1 NOT DETECTED NOT DETECTED Final   Parainfluenza Virus 2 NOT DETECTED NOT DETECTED Final   Parainfluenza Virus 3 NOT DETECTED NOT DETECTED Final   Parainfluenza Virus 4 NOT DETECTED NOT DETECTED Final   Respiratory Syncytial Virus NOT DETECTED NOT DETECTED Final   Bordetella pertussis NOT DETECTED NOT DETECTED Final   Bordetella Parapertussis NOT DETECTED NOT DETECTED Final   Chlamydophila pneumoniae NOT DETECTED NOT DETECTED Final   Mycoplasma pneumoniae NOT DETECTED NOT DETECTED Final    Comment: Performed at [facility], [address], [place], [place] [address]         Radiology Studies: DG Foot Complete Left Result Date: 10/21/2023 CLINICAL DATA:  Left foot swelling.  No reported injury. EXAM: LEFT FOOT - COMPLETE 3+ VIEW COMPARISON:  None Available. FINDINGS: Mild dorsal and distal soft tissue swelling. No underlying bony abnormality. IMPRESSION: Mild soft tissue swelling. Electronically Signed   By: Elspeth Bathe M.D.   On: 10/21/2023 15:19   EEG adult Result Date: 10/21/2023 Shelton Arlin KIDD, MD     10/21/2023  1:18 PM Patient Name: Allison Hughes MRN: [id number] Epilepsy Attending: Arlin KIDD Shelton Referring Physician/Provider: Cherlyn Labella, MD Date: 10/21/2023 Duration: 22.50 mins Patient history: 73yo M with ams. EEG to evaluate for seizure Level of alertness: Awake, asleep AEDs during EEG study: None Technical aspects: This EEG study was done with scalp electrodes positioned according to the 10-20 International system of electrode placement. Electrical activity was reviewed with band pass filter of 1-70Hz , sensitivity of 7 uV/mm, display speed of 4mm/sec with a 60Hz  notched filter applied as  appropriate. EEG data were recorded continuously  and digitally stored.  Video monitoring was available and reviewed as appropriate. Description: The posterior dominant rhythm consists of 7.5 Hz activity of moderate voltage (25-35 uV) seen predominantly in posterior head regions, symmetric and reactive to eye opening and eye closing. Sleep was characterized by vertex waves, sleep spindles (12 to 14 Hz), maximal frontocentral region. Hyperventilation and photic stimulation were not performed.   IMPRESSION: This study is within normal limits. No seizures or epileptiform discharges were seen throughout the recording. A normal interictal EEG does not exclude the diagnosis of epilepsy. Arlin MALVA Krebs   MR BRAIN WO CONTRAST Result Date: 10/20/2023 EXAM: MRI BRAIN WITHOUT CONTRAST 10/20/2023 09:20:29 PM TECHNIQUE: Multiplanar multisequence MRI of the head/brain was performed without the administration of intravenous contrast. COMPARISON: 06/04/2022 CLINICAL HISTORY: Stroke, follow up. Altered mental status, stroke, weakness. FINDINGS: BRAIN AND VENTRICLES: No acute infarct. No intracranial hemorrhage. No mass. No midline shift. No hydrocephalus. Old Left cerebellar infarct. Early confluent hyperintense T2-weighted signal within the cerebral white matter, most commonly due to chronic small vessel disease. Generalized volume loss. The sella is unremarkable. Normal flow voids. ORBITS: No acute abnormality. SINUSES AND MASTOIDS: No acute abnormality. BONES AND SOFT TISSUES: Normal marrow signal. No acute soft tissue abnormality. IMPRESSION: 1. No acute intracranial abnormality. 2. Old left cerebellar infarct. 3. Early confluent white matter T2 hyperintensity consistent with chronic small vessel disease. 4. Generalized cerebral volume loss. Electronically signed by: Franky Stanford MD 10/20/2023 11:29 PM EDT RP Workstation: [id number]   DG Abd 1 View Result Date: 10/20/2023 CLINICAL DATA:  Evaluate for metallic foreign body  EXAM: ABDOMEN - 1 VIEW COMPARISON:  None Available. FINDINGS: Scattered large and small bowel gas is noted. No abnormal mass or abnormal calcifications are noted. Loop recorder is noted in the anterior left chest wall. No other metallic foreign body is seen. IMPRESSION: No foreign body within the abdomen. Loop recorder is noted left chest. Electronically Signed   By: Oneil Devonshire M.D.   On: 10/20/2023 21:20   CT Cervical Spine Wo Contrast Result Date: 10/20/2023 EXAM: CT CERVICAL SPINE WITHOUT CONTRAST 10/20/2023 10:54:35 AM TECHNIQUE: CT of the cervical spine was performed without the administration of intravenous contrast. Multiplanar reformatted images are provided for review. Automated exposure control, iterative reconstruction, and/or weight based adjustment of the mA/kV was utilized to reduce the radiation dose to as low as reasonably achievable. COMPARISON: None available. CLINICAL HISTORY: Neck trauma (Age >= 65y). Mental status change, unknown cause. FINDINGS: CERVICAL SPINE: BONES AND ALIGNMENT: No acute fracture or traumatic malalignment. DEGENERATIVE CHANGES: Moderate chronic degenerative disc disease at C4-C5 and C5-C6, with posterior endplate ridging causing moderate central spinal canal stenosis at C5-C6 with severe bilateral neuroforaminal stenosis. There is also moderate central spinal canal stenosis and moderate-to-severe bilateral neural foraminal stenosis at C6-C7. SOFT TISSUES: No prevertebral soft tissue swelling. There is calcification within the carotid bulbs. There is mild emphysema within the lung apices. IMPRESSION: 1. No acute abnormality of the cervical spine related to the reported neck trauma. 2. Moderate chronic degenerative disc disease at C4-5 and C5-6, with associated moderate central spinal canal stenosis at C5-6 and severe bilateral neuroforaminal stenosis. 3. Moderate central spinal canal stenosis and moderate-to-severe bilateral neural foraminal stenosis at C6-7.  Electronically signed by: Evalene Coho MD 10/20/2023 11:09 AM EDT RP Workstation: [id number]   CT Head Wo Contrast Result Date: 10/20/2023 EXAM: CT HEAD WITHOUT CONTRAST 10/20/2023 10:54:35 AM TECHNIQUE: CT of the head was performed without the administration of intravenous contrast. Automated exposure control,  iterative reconstruction, and/or weight based adjustment of the mA/kV was utilized to reduce the radiation dose to as low as reasonably achievable. COMPARISON: CT of the head dated 09/19/2023. CLINICAL HISTORY: Mental status change, unknown cause. FINDINGS: BRAIN AND VENTRICLES: No acute hemorrhage. No evidence of acute infarct. No hydrocephalus. No extra-axial collection. No mass effect or midline shift. There is moderate generalized cerebral volume loss. There are chronic encephalomalacia changes again noted within the right parietal lobe. Focal encephalomalacia changes are also again demonstrated in the left cerebellar hemisphere. There is moderately advanced diffuse cerebral white matter disease. Senescent calcifications are noted within the basal ganglia. There is moderate calcification within the carotid siphons. ORBITS: No acute abnormality. SINUSES: No acute abnormality. SOFT TISSUES AND SKULL: No acute soft tissue abnormality. No skull fracture. IMPRESSION: 1. No acute intracranial abnormality. 2. Moderate generalized cerebral volume loss and moderately advanced diffuse cerebral white matter disease. 3. 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 Electronically signed by: Evalene Coho MD 10/20/2023 11:06 AM EDT RP Workstation: [id number]           LOS: 1 day   Time spent= 38 mins    Deliliah Room, MD [facility]  If 7PM-7AM, please contact night-coverage  10/22/2023, 10:46 AM

## 2023-10-22 NOTE — Inpatient Diabetes Management (Signed)
 Inpatient Diabetes Program Recommendations  AACE/ADA: New Consensus Statement on Inpatient Glycemic Control (2015)  Target Ranges:  Prepandial:   less than 140 mg/dL      Peak postprandial:   less than 180 mg/dL (1-2 hours)      Critically ill patients:  140 - 180 mg/dL   Lab Results  Component Value Date   GLUCAP 246 (H) 10/22/2023   HGBA1C 6.9 (H) 09/20/2023    Review of Glycemic Control  Latest Reference Range & Units 10/21/23 08:04 10/21/23 12:11 10/21/23 15:52 10/21/23 19:53 10/22/23 07:54  Glucose-Capillary 70 - 99 mg/dL 808 (H) 796 (H) 827 (H) 260 (H) 246 (H)  (H): Data is abnormally high Diabetes history: Type 2 DM Outpatient Diabetes medications: Tresiba  15 units every day, Metformin  500 mg QD Current orders for Inpatient glycemic control: Lantus  15 units at bedtime, Novolog  0-9 units TID Decadron 6 mg QD  Inpatient Diabetes Program Recommendations:    Consider adding Novolog  3 units TID (Assuming patient is consuming >50% of meals) and increasing Lantus  18 units at bedtime.   Thanks, Tinnie Minus, MSN, RNC-OB Diabetes Coordinator [phone] (8a-5p)

## 2023-10-22 NOTE — Plan of Care (Signed)
   Brief Palliative Medicine Progress Note:  PMT consult received and chart reviewed. Goals of care completed, full note to follow.  Recommendations: Continue full code Full scope of care Time for outcomes Hopeful for patient to return to baseline mental status to participate in goals of care Continued support of patient and friend Palliative medicine will follow-up in a couple days Please notify us  of any significant clinical change or new palliative needs in the interim   Thank you for allowing us  to participate in the care of Allison Hughes  Signed by: Camellia Kays, NP Palliative Medicine Team [facility] CHARGE  Team Phone # [phone] (Nights/Weekends)  10/22/2023, 4:40 PM

## 2023-10-22 NOTE — Plan of Care (Signed)
   Brief Palliative Medicine Progress Note:  PMT consult received and chart reviewed. Goals of care completed, full note to follow.  Recommendations: Full Code Full scope of care Hopeful for mental status to improve toward baseline with treatment Will attempt HCPOA completion this admission is patient regains capacity Palliative medicine will follow-up in a couple days Please notify us  of significant clinical change or new palliative needs in the interim   Thank you for allowing us  to participate in the care of Allison Hughes  Signed by: Camellia Kays, NP Palliative Medicine Team [facility] CHARGE  Team Phone # [phone] (Nights/Weekends)  10/22/2023, 4:57 PM

## 2023-10-22 NOTE — Consult Note (Signed)
 Palliative Care Consult Note                                  Date: 10/22/2023   Patient Name: Allison Hughes  DOB: Jul 16, 1950  MRN: [id number]  Age / Sex: 73 y.o., female  PCP: Valma Carwin, MD Referring Physician: Dino Antu, MD  Reason for Consultation: {Reason for Consult:23484}  Past Medical History:  Diagnosis Date   Acute pyelonephritis 02/02/2016   Diabetes mellitus 01/15/2004   T2DM   Hyperlipidemia    Hypertension    Obesity (BMI 30-39.9)    Pneumonia 09/06/2020   Sepsis (HCC) 02/02/2016   Stroke (HCC)     Subjective:   This NP Camellia Kays reviewed medical records, received report from team, assessed the patient and then meet at the patient's bedside to discuss diagnosis, prognosis, GOC, EOL wishes disposition and options.  Before meeting with the patient/family, I spent time reviewing the chart notes including ***. I also reviewed vital signs, nursing flowsheets, medication administrations record, labs, and imaging. Labs reviewed include ***.  I met with ***.   We meet to discuss diagnosis prognosis, GOC, EOL wishes, disposition and options. Concept of Palliative Care was introduced as specialized medical care for people and their families living with serious illness.  If focuses on providing relief from the symptoms and stress of a serious illness.  The goal is to improve quality of life for both the patient and the family. Values and goals of care important to patient and family were attempted to be elicited.  Created space and opportunity for patient  and family to explore thoughts and feelings regarding current medical situation   Natural trajectory and current clinical status were discussed. Questions and concerns addressed. Patient  encouraged to call with questions or concerns.    Patient/Family Understanding of Illness: ***  Life Review: ***  Patient Values: ***  Baseline Status: ***  Today's  Discussion: ***  Goals: ***  Review of Systems  Objective:   Primary Diagnoses: Present on Admission:  Acute metabolic encephalopathy  Urinary tract infection  Acute respiratory failure with hypoxia (HCC)  Chronic diastolic CHF (congestive heart failure) (HCC)  Essential hypertension  Hyperlipidemia   Vital Signs:  BP (!) 140/66 (BP Location: Left Arm)   Pulse 99   Temp (!) 97.5 F (36.4 C)   Resp 18   Ht 5' 2 (1.575 m)   Wt 82.1 kg   SpO2 94%   BMI 33.10 kg/m   Physical Exam  Palliative Assessment/Data: ***   Advanced Care Planning:   Existing Vynca/ACP Documentation: ***  Primary Decision Maker: {Primary Decision Fjxzm:78612}  Pertinent diagnosis: ***  The patient and/or family consented to a voluntary Advance Care Planning Conversation in person/over the phone***. Individuals present for the conversation: ***  Summary of the conversation: ***  Outcome of the conversations and/or documents completed: ***  I spent *** minutes providing separately identifiable ACP services with the patient and/or surrogate decision maker in a voluntary, in-person conversation discussing the patient's wishes and goals as detailed in the above note.  Assessment & Plan:   HPI/Patient Profile: 73 y.o. female  with past medical history of *** admitted on 10/20/2023 with ***.   SUMMARY OF RECOMMENDATIONS   ***  Symptom Management:  ***  Code Status: {Updated Palliative Code Status:33307}  Prognosis:  {Palliative Care Prognosis:23504}  Discharge Planning:  {Palliative dispostion:23505}   Discussed with: ***  Thank you for allowing us  to participate in the care of ERMINA OBERMAN PMT will continue to support holistically.  Time Total: ***  Detailed review of medical records (labs, imaging, vital signs), medically appropriate exam, discussed with treatment team, counseling and education to patient, family, & staff, documenting clinical information,  medication management, coordination of care  Signed by: Camellia Kays, NP Palliative Medicine Team  Team Phone # [phone] (Nights/Weekends)  10/22/2023, 4:38 PM

## 2023-10-22 NOTE — Evaluation (Addendum)
 Speech Language Pathology Evaluation Patient Details Name: Allison Hughes MRN: [id number] DOB: [birth year] Today's Date: 10/22/2023 Time: 8782-8760 SLP Time Calculation (min) (ACUTE ONLY): 22 min  Problem List:  Patient Active Problem List   Diagnosis Date Noted   Acute metabolic encephalopathy 10/20/2023   Urinary tract infection 10/20/2023   Chronic diastolic CHF (congestive heart failure) (HCC) 10/20/2023   Fall at home, initial encounter 10/20/2023   Hyperlipidemia 10/20/2023   History of gastric ulcer 10/20/2023   Acute respiratory failure with hypoxia (HCC) 09/20/2023   ABLA (acute blood loss anemia) 09/19/2023   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   Neurocognitive disorder 08/31/2023   Impaired mobility and ADLs 08/20/2023   Anemia 09/02/2022   Aphasia 06/03/2022   History of stroke 09/06/2020   DM (diabetes mellitus), type 2 (HCC) 11/20/2018   Hypokalemia 02/02/2016   Anxiety 02/02/2016   Essential hypertension 02/02/2016   Past Medical History:  Past Medical History:  Diagnosis Date   Acute pyelonephritis 02/02/2016   Diabetes mellitus 01/15/2004   T2DM   Hyperlipidemia    Hypertension    Obesity (BMI 30-39.9)    Pneumonia 09/06/2020   Sepsis (HCC) 02/02/2016   Stroke [facility])    Past Surgical History:  Past Surgical History:  Procedure Laterality Date   CESAREAN SECTION     LOOP RECORDER INSERTION N/A 09/08/2020   Procedure: LOOP RECORDER INSERTION;  Surgeon: Inocencio Soyla Lunger, MD;  Location: [facility] INVASIVE CV LAB;  Service: Cardiovascular;  Laterality: N/A;   ORIF ANKLE FRACTURE  05/11/2011   Procedure: OPEN REDUCTION INTERNAL FIXATION (ORIF) ANKLE FRACTURE;  Surgeon: Jerona LULLA Sage, MD;  Location: [facility] OR;  Service: Orthopedics;  Laterality: Right;   TUBAL LIGATION     HPI:  73yo female admitted 10/20/23 with AMS, found on the floor confused. Acute metabolic encephalopathy in the setting of UTI, acute hypoxic respiratory failure secondary to COVID. PMH:  HTN, HLD, CVA, COPD, DM2. GIB, neuroendocrine ulcer, loop recorder, obesity.Recent hospitalization 9/5-10/25 with PNA, UTI. MRI: no acute abnormality, old cerebellar infarct   Assessment / Plan / Recommendation Clinical Impression  Pt seen at bedside for cognitive linguistic evaluation. Pt awake and alert, hard of hearing. Pt reports 2 years of college. She lives alone, but has a friend Manus Cong who is there daily. He reports managing her finances and medications, as well as cooking and cleaning for her. He indicates she has demonstrated a decline in short term memory over the past few months.   Pt speech is fully intelligible. She was wearing upper dentures and is missing some lower dentition. The Mini-Mental State Examination (MMSE) was administered today. She scored 20/27 (reading/writing tasks not administered). Pt exhibited difficulty with orientation to time and situation. She is oriented to person and place. Pt exhibited difficulty with immediate recall of 3 unrelated words, requiring multiple presentations to be able to state them independently. She recalled 1/3 words after a delay and distracting activity. Pt was able to spell world backward accurately.  Language skills appear to be intact. She did have difficulty with phrase repetition and following multi-step commands, however, it is anticipated that this deficit was more related to reduced hearing acuity and SLP wearing a mask than receptive/expressive language deficit.   Given report of recent decline, performance today, and home situation (friend is there daily, but lives 30 miles away), recommend 24 hour supervision at discharge. Continued ST intervention recommended at next venue of care.    SLP Assessment  SLP Recommendation/Assessment: All further Speech Language  Pathology needs can be addressed in the next venue of care SLP Visit Diagnosis: Cognitive communication deficit (R41.841)     Assistance Recommended at Discharge   Frequent or constant Supervision/Assistance  Functional Status Assessment Patient has had a recent decline in their functional status and/or demonstrates limited ability to make significant improvements in function in a reasonable and predictable amount of time     SLP Evaluation Cognition  Overall Cognitive Status: History of cognitive impairments - at baseline Arousal/Alertness: Awake/alert Orientation Level: Oriented to person;Disoriented to situation;Disoriented to time;Oriented to place       Comprehension  Auditory Comprehension Overall Auditory Comprehension: Appears within functional limits for tasks assessed    Expression Expression Primary Mode of Expression: Verbal Verbal Expression Overall Verbal Expression: Appears within functional limits for tasks assessed   Oral / Motor  Oral Motor/Sensory Function Overall Oral Motor/Sensory Function: Within functional limits Motor Speech Overall Motor Speech: Appears within functional limits for tasks assessed Intelligibility: Intelligible           Carisa Backhaus B. Dory, MSP, CCC-SLP Speech Language Pathologist Office: [phone]  Dory Caprice Daring 10/22/2023, 12:56 PM

## 2023-10-22 NOTE — Plan of Care (Signed)

## 2023-10-23 DIAGNOSIS — G9341 Metabolic encephalopathy: Secondary | ICD-10-CM | POA: Diagnosis not present

## 2023-10-23 LAB — GLUCOSE, CAPILLARY
Glucose-Capillary: 223 mg/dL — ABNORMAL HIGH (ref 70–99)
Glucose-Capillary: 244 mg/dL — ABNORMAL HIGH (ref 70–99)
Glucose-Capillary: 276 mg/dL — ABNORMAL HIGH (ref 70–99)

## 2023-10-23 MED ORDER — QUETIAPINE FUMARATE 25 MG PO TABS
25.0000 mg | ORAL_TABLET | Freq: Every day | ORAL | Status: DC
  Administered 2023-10-23 – 2023-10-29 (×7): 25 mg via ORAL
  Filled 2023-10-23 (×7): qty 1

## 2023-10-23 NOTE — Plan of Care (Signed)

## 2023-10-23 NOTE — Progress Notes (Signed)
 Physical Therapy Treatment Patient Details Name: Allison Hughes MRN: [id number] DOB: [birth year] Today's Date: 10/23/2023   History of Present Illness 73 yo female found down on floor 10/6. Recent hospitalization 9/5-9/10 found on floor hypoxic with PNA and UTI d/c to [facility].  Currently pt with confusion, paranoid thoughts and forgetfulness. Found to have COVID and acute metabolic encephalopathy. PMH HTN HLD CVA COPD DM2 GIB, Obesity    PT Comments  The pt is making gradual progress as she was able to ambulate with min-modA today, but was only able to ambulate up to ~12 ft before needing to sit to rest. She is only oriented to herself at this time. She remains at high risk for falls. Will continue to follow acutely.    If plan is discharge home, recommend the following: A lot of help with walking and/or transfers;A lot of help with bathing/dressing/bathroom;Assistance with cooking/housework;Direct supervision/assist for medications management;Direct supervision/assist for financial management;Assist for transportation;Assistance with feeding;Help with stairs or ramp for entrance;Supervision due to cognitive status   Can travel by private vehicle     Yes  Equipment Recommendations  Wheelchair (measurements PT);Wheelchair cushion (measurements PT)    Recommendations for Other Services       Precautions / Restrictions Precautions Precautions: Fall Recall of Precautions/Restrictions: Impaired Precaution/Restrictions Comments: AMS, COVID precautions, watch SpO2 Restrictions Weight Bearing Restrictions Per Provider Order: No     Mobility  Bed Mobility Overal bed mobility: Needs Assistance Bed Mobility: Supine to Sit, Sit to Supine     Supine to sit: Contact guard, HOB elevated Sit to supine: Contact guard assist, HOB elevated   General bed mobility comments: Extra time, CGA for safety with multi-modal cues for pt to initiate    Transfers Overall transfer level:  Needs assistance Equipment used: Rolling walker (2 wheels) Transfers: Sit to/from Stand Sit to Stand: Min assist, Mod assist           General transfer comment: Cued pt to push up from bed but pt then would pull up on RW. x2 reps, needing min-modA to power up to stand and gain balance each rep    Ambulation/Gait Ambulation/Gait assistance: Mod assist Gait Distance (Feet): 12 Feet (x2 bouts of ~8 ft > ~12 ft) Assistive device: Rolling walker (2 wheels) Gait Pattern/deviations: Step-through pattern, Decreased step length - right, Decreased step length - left, Decreased stride length, Leaning posteriorly, Shuffle Gait velocity: reduced Gait velocity interpretation: <1.31 ft/sec, indicative of household ambulator   General Gait Details: Pt leans posteriorly and shuffles feet, needing tactile cues to weight shift and verbal cues to reduce lean. ModA for balance and managing RW   Stairs             Wheelchair Mobility     Tilt Bed    Modified Rankin (Stroke Patients Only)       Balance Overall balance assessment: Needs assistance Sitting-balance support: Feet supported, No upper extremity supported Sitting balance-Leahy Scale: Fair Sitting balance - Comments: able to static sit but given CGA for safety   Standing balance support: Reliant on assistive device for balance, Bilateral upper extremity supported, During functional activity Standing balance-Leahy Scale: Poor Standing balance comment: reliant on RW and min-modA                            Communication Communication Communication: Impaired (per prior notes) Factors Affecting Communication: Hearing impaired  Cognition Arousal: Alert Behavior During Therapy: Flat affect  PT - Cognitive impairments: No family/caregiver present to determine baseline, History of cognitive impairments, Orientation, Attention, Sequencing, Awareness   Orientation impairments: Place, Time, Situation                    PT - Cognition Comments: Upon arrival, pt asking about dog food that she reports was placed behind the door. No dog food present in room. Pt thought she was at home. When corrected she tried to pretend she knew or would change the subject. Pt disoriented to date and situation as well. Pt needs redirecting. Poor awareness of her deficits Following commands: Impaired Following commands impaired: Follows one step commands inconsistently, Follows one step commands with increased time    Cueing Cueing Techniques: Verbal cues, Gestural cues, Tactile cues, Visual cues  Exercises Other Exercises Other Exercises: IS x10 reps sitting EOB, educated pt on use and frequency    General Comments General comments (skin integrity, edema, etc.): VSS on 3L      Pertinent Vitals/Pain Pain Assessment Pain Assessment: Faces Faces Pain Scale: Hurts a little bit Pain Location: generalized Pain Descriptors / Indicators: Guarding, Grimacing, Discomfort Pain Intervention(s): Limited activity within patient's tolerance, Monitored during session, Repositioned    Home Living                          Prior Function            PT Goals (current goals can now be found in the care plan section) Acute Rehab PT Goals Patient Stated Goal: to take care of her dog PT Goal Formulation: With patient Time For Goal Achievement: 11/04/23 Potential to Achieve Goals: Fair Progress towards PT goals: Progressing toward goals    Frequency    Min 2X/week      PT Plan      Co-evaluation              AM-PAC PT 6 Clicks Mobility   Outcome Measure  Help needed turning from your back to your side while in a flat bed without using bedrails?: A Little Help needed moving from lying on your back to sitting on the side of a flat bed without using bedrails?: A Little Help needed moving to and from a bed to a chair (including a wheelchair)?: A Lot Help needed standing up from a chair using your  arms (e.g., wheelchair or bedside chair)?: A Lot Help needed to walk in hospital room?: Total (<20 ft) Help needed climbing 3-5 steps with a railing? : Total 6 Click Score: 12    End of Session Equipment Utilized During Treatment: Oxygen Activity Tolerance: Patient tolerated treatment well Patient left: in bed;with call bell/phone within reach;with bed alarm set   PT Visit Diagnosis: Muscle weakness (generalized) (M62.81);Repeated falls (R29.6);History of falling (Z91.81);Difficulty in walking, not elsewhere classified (R26.2);Pain;Unsteadiness on feet (R26.81);Other abnormalities of gait and mobility (R26.89) Pain - Right/Left: Left Pain - part of body: Ankle and joints of foot     Time: 8597-8578 PT Time Calculation (min) (ACUTE ONLY): 19 min  Charges:    $Gait Training: 8-22 mins PT General Charges $$ ACUTE PT VISIT: 1 Visit                     Theo Ferretti, PT, DPT Acute Rehabilitation Services  Office: [phone]    Theo CHRISTELLA Ferretti 10/23/2023, 5:15 PM

## 2023-10-23 NOTE — NC FL2 (Signed)
 [place]  MEDICAID FL2 LEVEL OF CARE FORM     IDENTIFICATION  Patient Name: Allison Hughes Birthdate: 09/09/1950 Sex: female Admission Date (Current Location): 10/20/2023  [facility] and [place] Number:  Producer, television/film/video and Address:  [facility], [address] [place], [place] [address]      Provider Number: [id number]  Attending Physician Name and Address:  Dino Antu, MD  Relative Name and Phone Number:       Current Level of Care: Hospital Recommended Level of Care: Skilled Nursing Facility Prior Approval Number:    Date Approved/Denied:   PASRR Number: [id number]  Discharge Plan: SNF    Current Diagnoses: Patient Active Problem List   Diagnosis Date Noted   Acute metabolic encephalopathy 10/20/2023   Urinary tract infection 10/20/2023   Chronic diastolic CHF (congestive heart failure) (HCC) 10/20/2023   Fall at home, initial encounter 10/20/2023   Hyperlipidemia 10/20/2023   History of gastric ulcer 10/20/2023   Acute respiratory failure with hypoxia (HCC) 09/20/2023   ABLA (acute blood loss anemia) 09/19/2023   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   Neurocognitive disorder 08/31/2023   Impaired mobility and ADLs 08/20/2023   Anemia 09/02/2022   Aphasia 06/03/2022   History of stroke 09/06/2020   DM (diabetes mellitus), type 2 (HCC) 11/20/2018   Hypokalemia 02/02/2016   Anxiety 02/02/2016   Essential hypertension 02/02/2016    Orientation RESPIRATION BLADDER Height & Weight     Self  O2 (nasal cannula 3L/min) Incontinent (existing urinary catheter) Weight: 172 lb 2.9 oz (78.1 kg) Height:  5' 2 (157.5 cm)  BEHAVIORAL SYMPTOMS/MOOD NEUROLOGICAL BOWEL NUTRITION STATUS      Incontinent Diet (please refer to DC summary)  AMBULATORY STATUS COMMUNICATION OF NEEDS Skin   Extensive Assist Verbally Normal                       Personal Care Assistance Level of Assistance  Bathing, Feeding, Dressing Bathing  Assistance: Limited assistance Feeding assistance: Limited assistance Dressing Assistance: Limited assistance     Functional Limitations Info  Sight, Hearing, Speech Sight Info: Adequate Hearing Info: Impaired (hard of hearing) Speech Info: Adequate    SPECIAL CARE FACTORS FREQUENCY  PT (By licensed PT), OT (By licensed OT)     PT Frequency: 5x/week OT Frequency: 5x/week            Contractures Contractures Info: Not present    Additional Factors Info  Code Status, Allergies Code Status Info: FULL Allergies Info: Iodine, shrimp           Current Medications (10/23/2023):  This is the current hospital active medication list Current Facility-Administered Medications  Medication Dose Route Frequency Provider Last Rate Last Admin   0.9 %  sodium chloride  infusion   Intravenous Continuous Akula, Vijaya, MD 75 mL/hr at 10/23/23 0913 New Bag at 10/23/23 0913   acetaminophen  (TYLENOL ) tablet 650 mg  650 mg Oral Q6H PRN Smith, Rondell A, MD       Or   acetaminophen  (TYLENOL ) suppository 650 mg  650 mg Rectal Q6H PRN Claudene Reeves A, MD       albuterol  (PROVENTIL ) (2.5 MG/3ML) 0.083% nebulizer solution 2.5 mg  2.5 mg Nebulization Q6H PRN Smith, Rondell A, MD       ALPRAZolam  (XANAX ) tablet 0.25 mg  0.25 mg Oral BID PRN Smith, Rondell A, MD   0.25 mg at 10/22/23 2013   atorvastatin  (LIPITOR) tablet 40 mg  40 mg Oral  Daily Smith, Rondell A, MD   40 mg at 10/23/23 0900   budesonide -glycopyrrolate -formoterol  (BREZTRI ) 160-9-4.8 MCG/ACT inhaler 2 puff  2 puff Inhalation BID Smith, Rondell A, MD   2 puff at 10/22/23 2016   cefTRIAXone  (ROCEPHIN ) 1 g in sodium chloride  0.9 % 100 mL IVPB  1 g Intravenous Q24H Claudene, Rondell A, MD 200 mL/hr at 10/23/23 0911 1 g at 10/23/23 0911   cyanocobalamin  (VITAMIN B12) tablet 500 mcg  500 mcg Oral Daily Akula, Vijaya, MD   500 mcg at 10/23/23 0900   dexamethasone (DECADRON) injection 6 mg  6 mg Intravenous Daily Franky Redia SAILOR, MD   6 mg at  10/23/23 0900   enoxaparin  (LOVENOX ) injection 40 mg  40 mg Subcutaneous Q24H Smith, Rondell A, MD   40 mg at 10/23/23 0900   haloperidol  lactate (HALDOL ) injection 1 mg  1 mg Intravenous Once Kakrakandy, Arshad N, MD       haloperidol  lactate (HALDOL ) injection 1 mg  1 mg Intravenous Q6H PRN Keturah Carrier, MD       insulin  aspart (novoLOG ) injection 0-9 Units  0-9 Units Subcutaneous TID WC Smith, Rondell A, MD   3 Units at 10/23/23 0854   insulin  glargine (LANTUS ) injection 15 Units  15 Units Subcutaneous QHS Smith, Rondell A, MD   15 Units at 10/22/23 2225   lisinopril  (ZESTRIL ) tablet 20 mg  20 mg Oral QHS Smith, Rondell A, MD   20 mg at 10/22/23 2013   ondansetron  (ZOFRAN ) tablet 4 mg  4 mg Oral Q6H PRN Smith, Rondell A, MD       Or   ondansetron  (ZOFRAN ) injection 4 mg  4 mg Intravenous Q6H PRN Smith, Rondell A, MD       protein supplement (ENSURE MAX) liquid  11 oz Oral BID Akula, Vijaya, MD   11 oz at 10/23/23 0915   sodium chloride  flush (NS) 0.9 % injection 3 mL  3 mL Intravenous Q12H Claudene Reeves A, MD   3 mL at 10/23/23 9085     Discharge Medications: Please see discharge summary for a list of discharge medications.  Relevant Imaging Results:  Relevant Lab Results:   Additional Information SSN: [id number]

## 2023-10-23 NOTE — Progress Notes (Signed)
 Remote Loop Recorder Transmission

## 2023-10-23 NOTE — Progress Notes (Signed)
 PROGRESS NOTE    Allison Hughes  FMW:[id number] DOB: 04/03/1950 DOA: 10/20/2023 PCP: Valma Carwin, MD   Brief Narrative:    73 y.o. female with medical history significant of hypertension, hyperlipidemia, CVA, COPD, diabetes mellitus type 2, GI bleed, neuroendocrine ulcer, and obesity who presents after being found on the floor confused she is being treated for acute metabolic encephalopathy in the setting of urinary tract infection, acute hypoxic respiratory failure secondary to COVID-19 infection and electrolytes are being repleted.  Assessment & Plan:  Principal Problem:   Acute metabolic encephalopathy Active Problems:   Urinary tract infection   Acute respiratory failure with hypoxia (HCC)   Chronic diastolic CHF (congestive heart failure) (HCC)   Fall at home, initial encounter   Essential hypertension   DM (diabetes mellitus), type 2 (HCC)   Hyperlipidemia   History of stroke   History of gastric ulcer   Acute metabolic encephalopathy:  In the setting of baseline cognitive impairment. CT head is negative for acute intracranial abnormality.  MRI brain showing No acute intracranial abnormality. Old left cerebellar infarct. Early confluent white matter T2 hyperintensity consistent with chronic small vessel disease.Generalized cerebral volume loss. EEG did not show any seizures.  Suspect from UTI Continue with IV ceftriaxone . Follow up urine cultures.        Acute hypoxic respiratory failure secondary to COVID Currently, requiring about 3 L of [place] oxygen.  Positive for COVID-19 infection -Dced corticosteroids on 10/9 -Initial bronchodilator therapy as needed     Left foot and ankle pain and some swelling on the dorsum of the foot X rays of the left foot showed evidence of soft tissue swelling without any acute fractures.   Hypokalemia and hypomagnesemia As needed repletion  Hospital acquired delirium: Frequent reorientation Dced steroids Started on seroquel  25 mg  at bedtime on 10/9.    H/o CVA  Continue with PT and OT     GERD Continue with PPI.    Body mass index is 33.1 kg/m. Obesity.      Hyperlipidemia Resumed statin      Controlled type 2 DM  Resume SSI.  A1c is 6.9% Diabetes coordinator on board, appreciate assistance   Disposition: Patient was living at home by herself.  PT/OT consulted.  She will need skilled nursing facility upon discharge.  Palliative care on board.  DVT prophylaxis: enoxaparin  (LOVENOX ) injection 40 mg Start: 10/20/23 1800     Code Status: Full Code Family Communication:  None at the bedside Status is: Inpatient Remains inpatient appropriate because: encephalopathy, COVID, needs placement    Subjective:  She was confused, disoriented and agitated overnight and was trying to climb out of the bed. She thinks that she is in [place]  and was talking about her boyfriend.  Examination:  General exam: Appears confused and disoriented  Respiratory system: Slightly coarse breath sounds bilaterally Cardiovascular system: S1 & S2 heard, RRR. No JVD, murmurs, rubs, gallops or clicks. No pedal edema. Gastrointestinal system: Abdomen is nondistended, soft and nontender. No organomegaly or masses felt. Normal bowel sounds heard. Central nervous system: Confused and disoriented. No focal neurological deficits. Extremities: Moving all extremities equally Skin: No rashes, lesions or ulcers Psychiatry: confused and disoriented     Diet Orders (From admission, onward)     Start     Ordered   10/20/23 1617  Diet Carb Modified Fluid consistency: Thin; Room service appropriate? Yes  Diet effective now       Question Answer Comment  Diet-HS Snack? Nothing  Calorie Level Medium 1600-2000   Fluid consistency: Thin   Room service appropriate? Yes      10/20/23 1616            Objective: Vitals:   10/22/23 2011 10/23/23 0500 10/23/23 0557 10/23/23 0903  BP: (!) 156/69  (!) 151/74 (!) 155/110   Pulse: (!) 106  79 81  Resp: 20  18 18   Temp: 98.5 F (36.9 C)  97.6 F (36.4 C) 97.8 F (36.6 C)  TempSrc: Oral  Oral   SpO2: 97%  94% 97%  Weight:  78.1 kg    Height:        Intake/Output Summary (Last 24 hours) at 10/23/2023 0956 Last data filed at 10/23/2023 0914 Gross per 24 hour  Intake 797.55 ml  Output 200 ml  Net 597.55 ml   Filed Weights   10/20/23 0929 10/20/23 0933 10/23/23 0500  Weight: 82.1 kg 82.1 kg 78.1 kg    Scheduled Meds:  atorvastatin   40 mg Oral Daily   budesonide -glycopyrrolate -formoterol   2 puff Inhalation BID   vitamin B-12  500 mcg Oral Daily   dexamethasone (DECADRON) injection  6 mg Intravenous Daily   enoxaparin  (LOVENOX ) injection  40 mg Subcutaneous Q24H   haloperidol  lactate  1 mg Intravenous Once   insulin  aspart  0-9 Units Subcutaneous TID WC   insulin  glargine  15 Units Subcutaneous QHS   lisinopril   20 mg Oral QHS   Ensure Max Protein  11 oz Oral BID   sodium chloride  flush  3 mL Intravenous Q12H   Continuous Infusions:  sodium chloride  75 mL/hr at 10/23/23 0913   cefTRIAXone  (ROCEPHIN )  IV 1 g (10/23/23 0911)    Nutritional status Signs/Symptoms:  (A1c 6.9, CBGs > 180)   Body mass index is 31.49 kg/m.  Data Reviewed:   CBC: Recent Labs  Lab 10/20/23 1059 10/20/23 1119 10/20/23 1127 10/21/23 0445 10/22/23 0400  WBC  --  6.7  --  6.6 5.5  NEUTROABS  --  5.0  --   --  3.4  HGB 12.6 13.3 15.0 11.1* 11.3*  HCT 37.0 45.0 44.0 36.6 37.5  MCV  --  74.9*  --  73.9* 74.4*  PLT  --  255  --  259 281   Basic Metabolic Panel: Recent Labs  Lab 10/20/23 1059 10/20/23 1127 10/20/23 1217 10/21/23 0445 10/22/23 0400  NA 141 139 139 139 140  K 3.1* 4.3 3.5 2.9* 3.7  CL  --  104 103 105 105  CO2  --   --  23 26 25   GLUCOSE  --  204* 173* 234* 207*  BUN  --  9 8 8 10   CREATININE  --  0.50 0.53 0.69 0.64  CALCIUM   --   --  9.1 8.8* 8.7*  MG  --   --   --  1.4*  --    GFR: Estimated Creatinine Clearance: 61.5 mL/min (by  C-G formula based on SCr of 0.64 mg/dL). Liver Function Tests: Recent Labs  Lab 10/20/23 1217 10/21/23 0445 10/22/23 0400  AST 30 25 24   ALT 24 25 23   ALKPHOS 112 92 105  BILITOT 0.8 0.5 0.3  PROT 6.3* 5.6* 6.0*  ALBUMIN 2.9* 2.6* 2.6*   No results for input(s): LIPASE, AMYLASE in the last 168 hours. Recent Labs  Lab 10/20/23 1821  AMMONIA 28   Coagulation Profile: No results for input(s): INR, PROTIME in the last 168 hours. Cardiac Enzymes: Recent Labs  Lab 10/20/23 1217  CKTOTAL 179   BNP (last 3 results) No results for input(s): PROBNP in the last 8760 hours. HbA1C: No results for input(s): HGBA1C in the last 72 hours. CBG: Recent Labs  Lab 10/22/23 0754 10/22/23 1154 10/22/23 1609 10/22/23 2222 10/23/23 0828  GLUCAP 246* 343* 282* 263* 223*   Lipid Profile: No results for input(s): CHOL, HDL, LDLCALC, TRIG, CHOLHDL, LDLDIRECT in the last 72 hours. Thyroid Function Tests: Recent Labs    10/21/23 1031  TSH 1.292   Anemia Panel: Recent Labs    10/21/23 1031  VITAMINB12 440   Sepsis Labs: Recent Labs  Lab 10/20/23 1821  PROCALCITON <0.10    Recent Results (from the past 240 hours)  SARS Coronavirus 2 by RT PCR (hospital order, performed in [facility] hospital lab) *cepheid single result test* Anterior Nasal Swab     Status: Abnormal   Collection Time: 10/21/23  2:50 PM   Specimen: Anterior Nasal Swab  Result Value Ref Range Status   SARS Coronavirus 2 by RT PCR POSITIVE (A) NEGATIVE Final    Comment: Performed at [facility], [address] [place], [place] [address]  Respiratory (~20 pathogens) panel by PCR     Status: None   Collection Time: 10/21/23  2:50 PM   Specimen: Nasopharyngeal Swab; Respiratory  Result Value Ref Range Status   Adenovirus NOT DETECTED NOT DETECTED Final   Coronavirus 229E NOT DETECTED NOT DETECTED Final    Comment: (NOTE) The Coronavirus on the Respiratory Panel, DOES NOT test for  the novel  Coronavirus (2019 nCoV)    Coronavirus HKU1 NOT DETECTED NOT DETECTED Final   Coronavirus NL63 NOT DETECTED NOT DETECTED Final   Coronavirus OC43 NOT DETECTED NOT DETECTED Final   Metapneumovirus NOT DETECTED NOT DETECTED Final   Rhinovirus / Enterovirus NOT DETECTED NOT DETECTED Final   Influenza A NOT DETECTED NOT DETECTED Final   Influenza B NOT DETECTED NOT DETECTED Final   Parainfluenza Virus 1 NOT DETECTED NOT DETECTED Final   Parainfluenza Virus 2 NOT DETECTED NOT DETECTED Final   Parainfluenza Virus 3 NOT DETECTED NOT DETECTED Final   Parainfluenza Virus 4 NOT DETECTED NOT DETECTED Final   Respiratory Syncytial Virus NOT DETECTED NOT DETECTED Final   Bordetella pertussis NOT DETECTED NOT DETECTED Final   Bordetella Parapertussis NOT DETECTED NOT DETECTED Final   Chlamydophila pneumoniae NOT DETECTED NOT DETECTED Final   Mycoplasma pneumoniae NOT DETECTED NOT DETECTED Final    Comment: Performed at [facility], [address] [place], [place] [address]         Radiology Studies: DG Foot Complete Left Result Date: 10/21/2023 CLINICAL DATA:  Left foot swelling.  No reported injury. EXAM: LEFT FOOT - COMPLETE 3+ VIEW COMPARISON:  None Available. FINDINGS: Mild dorsal and distal soft tissue swelling. No underlying bony abnormality. IMPRESSION: Mild soft tissue swelling. Electronically Signed   By: Elspeth Bathe M.D.   On: 10/21/2023 15:19   EEG adult Result Date: 10/21/2023 Shelton Arlin KIDD, MD     10/21/2023  1:18 PM Patient Name: Allison Hughes MRN: [id number] Epilepsy Attending: Arlin KIDD Shelton Referring Physician/Provider: Cherlyn Labella, MD Date: 10/21/2023 Duration: 22.50 mins Patient history: 73yo M with ams. EEG to evaluate for seizure Level of alertness: Awake, asleep AEDs during EEG study: None Technical aspects: This EEG study was done with scalp electrodes positioned according to the 10-20 International system of electrode placement. Electrical activity  was reviewed with band pass filter of 1-70Hz , sensitivity of  7 uV/mm, display speed of 63mm/sec with a 60Hz  notched filter applied as appropriate. EEG data were recorded continuously and digitally stored.  Video monitoring was available and reviewed as appropriate. Description: The posterior dominant rhythm consists of 7.5 Hz activity of moderate voltage (25-35 uV) seen predominantly in posterior head regions, symmetric and reactive to eye opening and eye closing. Sleep was characterized by vertex waves, sleep spindles (12 to 14 Hz), maximal frontocentral region. Hyperventilation and photic stimulation were not performed.   IMPRESSION: This study is within normal limits. No seizures or epileptiform discharges were seen throughout the recording. A normal interictal EEG does not exclude the diagnosis of epilepsy. Priyanka MALVA Krebs           LOS: 2 days   Time spent= 38 mins    Deliliah Room, MD [facility]  If 7PM-7AM, please contact night-coverage  10/23/2023, 9:56 AM

## 2023-10-23 NOTE — Plan of Care (Signed)
  Problem: Education: Goal: Ability to describe self-care measures that may prevent or decrease complications (Diabetes Survival Skills Education) will improve Outcome: Progressing Goal: Individualized Educational Video(s) Outcome: Progressing   Problem: Coping: Goal: Ability to adjust to condition or change in health will improve Outcome: Progressing   Problem: Fluid Volume: Goal: Ability to maintain a balanced intake and output will improve Outcome: Progressing   Problem: Health Behavior/Discharge Planning: Goal: Ability to identify and utilize available resources and services will improve Outcome: Progressing Goal: Ability to manage health-related needs will improve Outcome: Progressing   Problem: Metabolic: Goal: Ability to maintain appropriate glucose levels will improve Outcome: Progressing   Problem: Nutritional: Goal: Maintenance of adequate nutrition will improve Outcome: Progressing Goal: Progress toward achieving an optimal weight will improve Outcome: Progressing   Problem: Skin Integrity: Goal: Risk for impaired skin integrity will decrease Outcome: Progressing   Problem: Tissue Perfusion: Goal: Adequacy of tissue perfusion will improve Outcome: Progressing   Problem: Education: Goal: Knowledge of General Education information will improve Description: Including pain rating scale, medication(s)/side effects and non-pharmacologic comfort measures Outcome: Progressing   Problem: Health Behavior/Discharge Planning: Goal: Ability to manage health-related needs will improve Outcome: Progressing

## 2023-10-23 NOTE — TOC Initial Note (Signed)
 Transition of Care [facility]) - Initial/Assessment Note    Patient Details  Name: Allison Hughes MRN: [id number] Date of Birth: [birth year]  [facility] CM/SW Contact:    [name] Clack, LCSWA Phone Number: 10/23/2023, 9:58 AM  Clinical Narrative:                  CSW met with patient and friend at bedside (10/7) to discuss anticipated discharge needs. Patient had a prior negative experience at [facility] and would not like to return there. Patient and friend are agreeable to SNF if PT recommends it. CSW faxed patient out in the hub and will present bed offers. CSW will continue to follow.    Expected Discharge Plan: Skilled Nursing Facility Barriers to Discharge: Continued Medical Work up, SNF Pending bed offer   Patient Goals and CMS Choice            Expected Discharge Plan and Services                                              Prior Living Arrangements/Services                       Activities of Daily Living   ADL Screening (condition at time of admission) Independently performs ADLs?: No Does the patient have a NEW difficulty with bathing/dressing/toileting/self-feeding that is expected to last >3 days?: Yes (Initiates electronic notice to provider for possible OT consult) Does the patient have a NEW difficulty with getting in/out of bed, walking, or climbing stairs that is expected to last >3 days?: Yes (Initiates electronic notice to provider for possible PT consult) Does the patient have a NEW difficulty with communication that is expected to last >3 days?: Yes (Initiates electronic notice to provider for possible SLP consult) Is the patient deaf or have difficulty hearing?: No Does the patient have difficulty seeing, even when wearing glasses/contacts?: Yes (Unknown) Does the patient have difficulty concentrating, remembering, or making decisions?: Yes  Permission Sought/Granted                  Emotional  Assessment     Affect (typically observed): Appropriate Orientation: : Oriented to Self      Admission diagnosis:  Altered mental status, unspecified altered mental status type [R41.82] Acute metabolic encephalopathy [G93.41] Patient Active Problem List   Diagnosis Date Noted   Acute metabolic encephalopathy 10/20/2023   Urinary tract infection 10/20/2023   Chronic diastolic CHF (congestive heart failure) (HCC) 10/20/2023   Fall at home, initial encounter 10/20/2023   Hyperlipidemia 10/20/2023   History of gastric ulcer 10/20/2023   Acute respiratory failure with hypoxia (HCC) 09/20/2023   ABLA (acute blood loss anemia) 09/19/2023   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   Neurocognitive disorder 08/31/2023   Impaired mobility and ADLs 08/20/2023   Anemia 09/02/2022   Aphasia 06/03/2022   History of stroke 09/06/2020   DM (diabetes mellitus), type 2 (HCC) 11/20/2018   Hypokalemia 02/02/2016   Anxiety 02/02/2016   Essential hypertension 02/02/2016   PCP:  Valma Carwin, MD Pharmacy:   [facility] - [place], [place] - [address] [place] [address] Phone: [phone] Fax: [phone]  [facility]) - [place], [place] - [address] [facility] [address]  [place] [address] Phone: [phone] Fax: [phone]  [facility] [address] [place] [address] Phone: [phone] Fax: [phone]     Social Drivers of Health (SDOH) Social History: SDOH Screenings   Food Insecurity: Patient Unable To Answer (10/20/2023)  Housing: Unknown (10/20/2023)  Transportation Needs: Patient Unable To Answer (10/20/2023)  Utilities: Patient Unable To Answer (10/20/2023)  Depression (PHQ2-9): Low Risk  (10/26/2020)  Social Connections: Patient Unable To Answer (10/20/2023)  Tobacco Use: High Risk (10/20/2023)   SDOH Interventions:      Readmission Risk Interventions     No data to display

## 2023-10-24 ENCOUNTER — Ambulatory Visit: Payer: Self-pay | Admitting: Cardiology

## 2023-10-24 DIAGNOSIS — G9341 Metabolic encephalopathy: Secondary | ICD-10-CM | POA: Diagnosis not present

## 2023-10-24 DIAGNOSIS — Z515 Encounter for palliative care: Secondary | ICD-10-CM | POA: Diagnosis not present

## 2023-10-24 DIAGNOSIS — Z7189 Other specified counseling: Secondary | ICD-10-CM | POA: Diagnosis not present

## 2023-10-24 LAB — GLUCOSE, CAPILLARY
Glucose-Capillary: 233 mg/dL — ABNORMAL HIGH (ref 70–99)
Glucose-Capillary: 212 mg/dL — ABNORMAL HIGH (ref 70–99)
Glucose-Capillary: 120 mg/dL — ABNORMAL HIGH (ref 70–99)

## 2023-10-24 NOTE — Progress Notes (Signed)
 Remote Loop Recorder Transmission

## 2023-10-24 NOTE — Plan of Care (Signed)

## 2023-10-24 NOTE — Progress Notes (Signed)
 Mobility Specialist: Progress Note   10/24/23 1400  Mobility  Activity Ambulated with assistance  Level of Assistance Minimal assist, patient does 75% or more  Assistive Device Front wheel walker  Distance Ambulated (ft) 60 ft  Activity Response Tolerated well  Mobility Referral Yes  Mobility visit 1 Mobility  Mobility Specialist Start Time (ACUTE ONLY) 0915  Mobility Specialist Stop Time (ACUTE ONLY) [id number]  Mobility Specialist Time Calculation (min) (ACUTE ONLY) 24 min    Pt received in bed, pleasantly confused and agreeable to mobility session. Soiled in urine upon entry. SV for bed mobility. MinA for STS, heavy posterior lean upon initially stand but pt able to correct without cues. MinA for ambulation initially to steady but pt progressed to CGA. Ambulated two laps from the door to bed. Sat in a straight back chair while MS changed bed linens and gown. CGA for STS and short bout of ambulation back to bed. Left in bed with all needs met, call bell in reach.   [name] Mobility Specialist Please contact via SecureChat or Rehab office at [phone]

## 2023-10-24 NOTE — Progress Notes (Signed)
 Daily Progress Note   Patient Name: Allison Hughes       Date: 10/24/2023 DOB: 1950/12/12  Age: 73 y.o. MRN#: [id number] Attending Physician: Dino Antu, MD Primary Care Physician: Valma Carwin, MD Admit Date: 10/20/2023  Reason for Consultation/Follow-up: Establishing goals of care  Length of Stay: 3  Current Medications: Scheduled Meds:   atorvastatin   40 mg Oral Daily   budesonide -glycopyrrolate -formoterol   2 puff Inhalation BID   vitamin B-12  500 mcg Oral Daily   enoxaparin  (LOVENOX ) injection  40 mg Subcutaneous Q24H   haloperidol  lactate  1 mg Intravenous Once   insulin  aspart  0-9 Units Subcutaneous TID WC   insulin  glargine  15 Units Subcutaneous QHS   lisinopril   20 mg Oral QHS   Ensure Max Protein  11 oz Oral BID   QUEtiapine   25 mg Oral QHS   sodium chloride  flush  3 mL Intravenous Q12H    Continuous Infusions:  sodium chloride  75 mL/hr at 10/24/23 0912    PRN Meds: acetaminophen  **OR** acetaminophen , albuterol , ALPRAZolam , haloperidol  lactate, ondansetron  **OR** ondansetron  (ZOFRAN ) IV  Physical Exam Vitals reviewed.  Constitutional:      General: Allison Hughes is not in acute distress. HENT:     Head: Normocephalic and atraumatic.  Cardiovascular:     Rate and Rhythm: Normal rate.  Pulmonary:     Effort: Pulmonary effort is normal.  Skin:    General: Skin is warm and dry.  Neurological:     Mental Status: Allison Hughes is oriented to person, place, and time. Allison Hughes is confused.             Vital Signs: BP 135/60 (BP Location: Right Arm)   Pulse 75   Temp 98 F (36.7 C) (Oral)   Resp 16   Ht 5' 2 (1.575 m)   Wt 78.1 kg   SpO2 95%   BMI 31.49 kg/m  SpO2: SpO2: 95 % O2 Device: O2 Device: Nasal Cannula O2 Flow Rate: O2 Flow Rate (L/min): 3 L/min    Patient  Active Problem List   Diagnosis Date Noted   Acute metabolic encephalopathy 10/20/2023   Urinary tract infection 10/20/2023   Chronic diastolic CHF (congestive heart failure) (HCC) 10/20/2023   Fall at home, initial encounter 10/20/2023   Hyperlipidemia 10/20/2023   History of gastric ulcer 10/20/2023  Acute respiratory failure with hypoxia (HCC) 09/20/2023   ABLA (acute blood loss anemia) 09/19/2023   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 Allison Hughes is being treated for acute metabolic encephalopathy in the setting of UTI and acute hypoxic respiratory failure secondary to Covid-19 infection.   Today's Discussion: Patient sitting up in bed in NAD. Patient remains confused and forgetful. I tell her I am with the palliative care team but Allison Hughes has trouble understanding our role. Allison Hughes correctly tells me that Allison Hughes has agreed to go to SNF for rehab because Allison Hughes wants to get stronger and [place] of [place]. Her friend Manus is helping her to make a decision on which SNF. Allison Hughes agrees to allowing me to call her friend [name] to discuss her hospitalization and plans moving forward.  Spoke with Phil by phone. He shared he is happy but surprised the patient has agreed to go to SNF. Allison Hughes did not enjoy her time at [facility] so Allison Hughes has agreed to go to SNF as long as it is not there. [name] would like to see how much independence and mobility Allison Hughes can gain at rehab. [name] helps the patient during the day with ADLs and IADLs  but admits he cannot help as much physically as he would like.  We discussed Phil's role as proxy decision maker. The patient has asked him before to be her HCPOA. We discussed how this could be done. If the patient has capacity prior to discharge we could complete this in the hospital. Encouraged Phil and patient to discuss goals of care once her confusion has improved.  Emotional support and therapeutic listening provided. Discussed the importance of continued conversation with family and the medical providers regarding overall plan of care and treatment options, ensuring decisions are within the context of the patient's values and GOCs. PMT will continue to support.  Recommendations/Plan: Full code Full scope Time for outcomes- Hopeful for patient to return to baseline mental status to participate in goals of care discussions SNF with rehab at discharge PMT to support    Code Status:    Code Status Orders  (From admission, onward)           Start     Ordered   10/20/23 1617  Full code  Continuous       Question:  By:  Answer:  Consent: discussion documented in EHR   10/20/23 1616         Extensive chart review has been completed prior to seeing the patient including labs, vital signs, imaging, progress/consult notes, orders, medications, and available advance directive documents.  Care plan was discussed with TOC  Time spent: 50 minutes   Thank you for allowing the Palliative Medicine Team to assist in the care of this patient.    Stephane CHRISTELLA Palin, NP  Please contact Palliative Medicine Team phone at [phone] for questions and concerns.

## 2023-10-24 NOTE — Progress Notes (Signed)
 Occupational Therapy Treatment Patient Details Name: Allison Hughes MRN: [id number] DOB: 08-28-1950 Today's Date: 10/24/2023   History of present illness 73 yo female found down on floor 10/6. Recent hospitalization 9/5-9/10 found on floor hypoxic with PNA and UTI d/c to [facility].  Currently pt with confusion, paranoid thoughts and forgetfulness. Found to have COVID and acute metabolic encephalopathy. PMH HTN HLD CVA COPD DM2 GIB, Obesity   OT comments  Patient attempting to get OOB upon entry with rail up.  Patient was CGA to completely get to EOB.  Patient able to stand from EOB with min assist and ambulated to sink for grooming and UB bathing with CGA while standing and required seated rest break to complete. Verbal cues for safety and sequencing for self care and mobility. Patient left up in chair at end of session with chair alarm.  Patient will benefit from continued inpatient follow up therapy, <3 hours/day.  Acute OT to continue to follow to address established goals to facilitate DC to next venue of care.        If plan is discharge home, recommend the following:  A little help with walking and/or transfers;A lot of help with bathing/dressing/bathroom;Assistance with cooking/housework;Direct supervision/assist for medications management;Direct supervision/assist for financial management   Equipment Recommendations  BSC/3in1;Wheelchair (measurements OT);Wheelchair cushion (measurements OT) (RW)    Recommendations for Other Services      Precautions / Restrictions Precautions Precautions: Fall Recall of Precautions/Restrictions: Impaired Precaution/Restrictions Comments: AMS, COVID precautions, watch SpO2 Restrictions Weight Bearing Restrictions Per Provider Order: No       Mobility Bed Mobility Overal bed mobility: Needs Assistance Bed Mobility: Supine to Sit     Supine to sit: Contact guard, HOB elevated     General bed mobility comments: patient  partially out of bed upon entry and CGA to safely get to EOB    Transfers Overall transfer level: Needs assistance Equipment used: Rolling walker (2 wheels) Transfers: Sit to/from Stand Sit to Stand: Min assist           General transfer comment: min assist to stand from EOB and chair with education on hand placement     Balance Overall balance assessment: Needs assistance Sitting-balance support: Feet supported, No upper extremity supported Sitting balance-Leahy Scale: Fair Sitting balance - Comments: EOB   Standing balance support: Single extremity supported, Bilateral upper extremity supported, During functional activity Standing balance-Leahy Scale: Poor Standing balance comment: stood at sink with one extremity support                           ADL either performed or assessed with clinical judgement   ADL Overall ADL's : Needs assistance/impaired     Grooming: Wash/dry hands;Wash/dry face;Oral care;Brushing hair;Contact guard assist;Sitting;Standing Grooming Details (indicate cue type and reason): performed in standing initially but required seated break to complete Upper Body Bathing: Supervision/ safety;Sitting;Cueing for sequencing               Toilet Transfer: Rolling walker (2 wheels);Ambulation;Minimal assistance Toilet Transfer Details (indicate cue type and reason): simulated           General ADL Comments: cues for sequencing    Extremity/Trunk Assessment              Vision       Perception     Praxis     Communication Communication Communication: Other (comment) (HOH) Factors Affecting Communication: Hearing impaired   Cognition Arousal: Alert  Behavior During Therapy: Flat affect Cognition: Cognition impaired   Orientation impairments: Situation, Place Awareness: Intellectual awareness impaired, Online awareness impaired     Executive functioning impairment (select all impairments): Initiation OT - Cognition  Comments: able to give me correct date and day,                 Following commands: Impaired Following commands impaired: Follows one step commands inconsistently, Follows one step commands with increased time      Cueing   Cueing Techniques: Verbal cues, Gestural cues, Tactile cues, Visual cues  Exercises      Shoulder Instructions       General Comments on 3 liters O2 with SpO2 98%    Pertinent Vitals/ Pain       Pain Assessment Pain Assessment: No/denies pain Pain Intervention(s): Monitored during session  Home Living                                          Prior Functioning/Environment              Frequency  Min 2X/week        Progress Toward Goals  OT Goals(current goals can now be found in the care plan section)  Progress towards OT goals: Progressing toward goals  Acute Rehab OT Goals Patient Stated Goal: to go home OT Goal Formulation: With patient Time For Goal Achievement: 11/04/23 Potential to Achieve Goals: Fair ADL Goals Pt Will Perform Grooming: with mod assist;sitting Pt Will Perform Upper Body Bathing: with mod assist;sitting Pt Will Transfer to Toilet: with mod assist;bedside commode;stand pivot transfer Additional ADL Goal #1: pt will complete bed mobility mod (A) Additional ADL Goal #2: pt will follow 2 step command 50% of attempts  Plan      Co-evaluation                 AM-PAC OT 6 Clicks Daily Activity     Outcome Measure   Help from another person eating meals?: A Little Help from another person taking care of personal grooming?: A Little Help from another person toileting, which includes using toliet, bedpan, or urinal?: A Lot Help from another person bathing (including washing, rinsing, drying)?: A Lot Help from another person to put on and taking off regular upper body clothing?: A Lot Help from another person to put on and taking off regular lower body clothing?: A Lot 6 Click Score:  14    End of Session Equipment Utilized During Treatment: Gait belt;Rolling walker (2 wheels);Oxygen  OT Visit Diagnosis: Unsteadiness on feet (R26.81);Muscle weakness (generalized) (M62.81)   Activity Tolerance Patient tolerated treatment well   Patient Left in chair;with call bell/phone within reach;with chair alarm set   Nurse Communication Mobility status;Precautions;Other (comment) (patient left up in chair)        Time: 1001-1030 OT Time Calculation (min): 29 min  Charges: OT General Charges $OT Visit: 1 Visit OT Treatments $Self Care/Home Management : 23-37 mins  Allison Hughes, OTA Acute Rehabilitation Services  Office [phone]   Allison Hughes 10/24/2023, 11:55 AM

## 2023-10-24 NOTE — Progress Notes (Signed)
 PROGRESS NOTE    Allison Hughes  FMW:[id number] DOB: 1950/06/18 DOA: 10/20/2023 PCP: Valma Carwin, MD   Brief Narrative:    73 y.o. female with medical history significant of hypertension, hyperlipidemia, CVA, COPD, diabetes mellitus type 2, GI bleed, neuroendocrine ulcer, and obesity who presents after being found on the floor confused she is being treated for acute metabolic encephalopathy in the setting of urinary tract infection, acute hypoxic respiratory failure secondary to COVID-19 infection and electrolytes are being repleted. Pending SNF placement.  Assessment & Plan:  Principal Problem:   Acute metabolic encephalopathy Active Problems:   Urinary tract infection   Acute respiratory failure with hypoxia (HCC)   Chronic diastolic CHF (congestive heart failure) (HCC)   Fall at home, initial encounter   Essential hypertension   DM (diabetes mellitus), type 2 (HCC)   Hyperlipidemia   History of stroke   History of gastric ulcer   Acute metabolic encephalopathy:  In the setting of baseline cognitive impairment. CT head is negative for acute intracranial abnormality.  MRI brain showing No acute intracranial abnormality. Old left cerebellar infarct. Early confluent white matter T2 hyperintensity consistent with chronic small vessel disease.Generalized cerebral volume loss. EEG did not show any seizures.  Suspect from UTI Continue with IV ceftriaxone . Follow up urine cultures.        Acute hypoxic respiratory failure secondary to COVID Currently, requiring about 3 L of [place] oxygen.  Positive for COVID-19 infection -Dced corticosteroids on 10/9 -Initial bronchodilator therapy as needed     Left foot and ankle pain and some swelling on the dorsum of the foot X rays of the left foot showed evidence of soft tissue swelling without any acute fractures.   Hypokalemia and hypomagnesemia As needed repletion  Hospital acquired delirium: Frequent reorientation Dced  steroids Started on seroquel  25 mg at bedtime on 10/9.    H/o CVA  Continue with PT and OT     GERD Continue with PPI.    Body mass index is 33.1 kg/m. Obesity.      Hyperlipidemia Resumed statin      Controlled type 2 DM  Resume SSI.  A1c is 6.9% Diabetes coordinator on board, appreciate assistance   Disposition: Patient was living at home by herself.  PT/OT consulted.  She will need skilled nursing facility upon discharge.  Palliative care on board.  DVT prophylaxis: enoxaparin  (LOVENOX ) injection 40 mg Start: 10/20/23 1800     Code Status: Full Code Family Communication:  None at the bedside Status is: Inpatient Remains inpatient appropriate because: encephalopathy, COVID, needs placement    Subjective:  She is still confused and disoriented to person, place and time. She kept talking about the TV show that she was watching on TV. She didn't know that she is in the hospital.  Examination:  General exam: Appears confused and disoriented  Respiratory system: clear breath sounds bilaterally Cardiovascular system: S1 & S2 heard, RRR. No JVD, murmurs, rubs, gallops or clicks. No pedal edema. Gastrointestinal system: Abdomen is nondistended, soft and nontender. No organomegaly or masses felt. Normal bowel sounds heard. Central nervous system: Confused and disoriented. No focal neurological deficits. Extremities: Moving all extremities equally Skin: No rashes, lesions or ulcers Psychiatry: confused and disoriented     Diet Orders (From admission, onward)     Start     Ordered   10/20/23 1617  Diet Carb Modified Fluid consistency: Thin; Room service appropriate? Yes  Diet effective now       Question Answer  Comment  Diet-HS Snack? Nothing   Calorie Level Medium 1600-2000   Fluid consistency: Thin   Room service appropriate? Yes      10/20/23 1616            Objective: Vitals:   10/23/23 1155 10/23/23 1537 10/23/23 2048 10/24/23 0400  BP:   135/72  (!) 163/86  Pulse: 79 85 99 81  Resp: 18  16 18   Temp:  98.2 F (36.8 C)  98.2 F (36.8 C)  TempSrc:    Oral  SpO2: 99% 97% 95% 98%  Weight:      Height:       No intake or output data in the 24 hours ending 10/24/23 1021  Filed Weights   10/20/23 0929 10/20/23 0933 10/23/23 0500  Weight: 82.1 kg 82.1 kg 78.1 kg    Scheduled Meds:  atorvastatin   40 mg Oral Daily   budesonide -glycopyrrolate -formoterol   2 puff Inhalation BID   vitamin B-12  500 mcg Oral Daily   enoxaparin  (LOVENOX ) injection  40 mg Subcutaneous Q24H   haloperidol  lactate  1 mg Intravenous Once   insulin  aspart  0-9 Units Subcutaneous TID WC   insulin  glargine  15 Units Subcutaneous QHS   lisinopril   20 mg Oral QHS   Ensure Max Protein  11 oz Oral BID   QUEtiapine   25 mg Oral QHS   sodium chloride  flush  3 mL Intravenous Q12H   Continuous Infusions:  sodium chloride  75 mL/hr at 10/24/23 0912    Nutritional status Signs/Symptoms:  (A1c 6.9, CBGs > 180)   Body mass index is 31.49 kg/m.  Data Reviewed:   CBC: Recent Labs  Lab 10/20/23 1059 10/20/23 1119 10/20/23 1127 10/21/23 0445 10/22/23 0400  WBC  --  6.7  --  6.6 5.5  NEUTROABS  --  5.0  --   --  3.4  HGB 12.6 13.3 15.0 11.1* 11.3*  HCT 37.0 45.0 44.0 36.6 37.5  MCV  --  74.9*  --  73.9* 74.4*  PLT  --  255  --  259 281   Basic Metabolic Panel: Recent Labs  Lab 10/20/23 1059 10/20/23 1127 10/20/23 1217 10/21/23 0445 10/22/23 0400  NA 141 139 139 139 140  K 3.1* 4.3 3.5 2.9* 3.7  CL  --  104 103 105 105  CO2  --   --  23 26 25   GLUCOSE  --  204* 173* 234* 207*  BUN  --  9 8 8 10   CREATININE  --  0.50 0.53 0.69 0.64  CALCIUM   --   --  9.1 8.8* 8.7*  MG  --   --   --  1.4*  --    GFR: Estimated Creatinine Clearance: 61.5 mL/min (by C-G formula based on SCr of 0.64 mg/dL). Liver Function Tests: Recent Labs  Lab 10/20/23 1217 10/21/23 0445 10/22/23 0400  AST 30 25 24   ALT 24 25 23   ALKPHOS 112 92 105  BILITOT 0.8  0.5 0.3  PROT 6.3* 5.6* 6.0*  ALBUMIN 2.9* 2.6* 2.6*   No results for input(s): LIPASE, AMYLASE in the last 168 hours. Recent Labs  Lab 10/20/23 1821  AMMONIA 28   Coagulation Profile: No results for input(s): INR, PROTIME in the last 168 hours. Cardiac Enzymes: Recent Labs  Lab 10/20/23 1217  CKTOTAL 179   BNP (last 3 results) No results for input(s): PROBNP in the last 8760 hours. HbA1C: No results for input(s): HGBA1C in the last 72 hours. CBG: Recent  Labs  Lab 10/22/23 1609 10/22/23 2222 10/23/23 0828 10/23/23 1136 10/23/23 1539  GLUCAP 282* 263* 223* 244* 276*   Lipid Profile: No results for input(s): CHOL, HDL, LDLCALC, TRIG, CHOLHDL, LDLDIRECT in the last 72 hours. Thyroid Function Tests: Recent Labs    10/21/23 1031  TSH 1.292   Anemia Panel: Recent Labs    10/21/23 1031  VITAMINB12 440   Sepsis Labs: Recent Labs  Lab 10/20/23 1821  PROCALCITON <0.10    Recent Results (from the past 240 hours)  SARS Coronavirus 2 by RT PCR (hospital order, performed in [facility] hospital lab) *cepheid single result test* Anterior Nasal Swab     Status: Abnormal   Collection Time: 10/21/23  2:50 PM   Specimen: Anterior Nasal Swab  Result Value Ref Range Status   SARS Coronavirus 2 by RT PCR POSITIVE (A) NEGATIVE Final    Comment: Performed at [facility], [address] [place], [place] [address]  Respiratory (~20 pathogens) panel by PCR     Status: None   Collection Time: 10/21/23  2:50 PM   Specimen: Nasopharyngeal Swab; Respiratory  Result Value Ref Range Status   Adenovirus NOT DETECTED NOT DETECTED Final   Coronavirus 229E NOT DETECTED NOT DETECTED Final    Comment: (NOTE) The Coronavirus on the Respiratory Panel, DOES NOT test for the novel  Coronavirus (2019 nCoV)    Coronavirus HKU1 NOT DETECTED NOT DETECTED Final   Coronavirus NL63 NOT DETECTED NOT DETECTED Final   Coronavirus OC43 NOT DETECTED NOT DETECTED Final    Metapneumovirus NOT DETECTED NOT DETECTED Final   Rhinovirus / Enterovirus NOT DETECTED NOT DETECTED Final   Influenza A NOT DETECTED NOT DETECTED Final   Influenza B NOT DETECTED NOT DETECTED Final   Parainfluenza Virus 1 NOT DETECTED NOT DETECTED Final   Parainfluenza Virus 2 NOT DETECTED NOT DETECTED Final   Parainfluenza Virus 3 NOT DETECTED NOT DETECTED Final   Parainfluenza Virus 4 NOT DETECTED NOT DETECTED Final   Respiratory Syncytial Virus NOT DETECTED NOT DETECTED Final   Bordetella pertussis NOT DETECTED NOT DETECTED Final   Bordetella Parapertussis NOT DETECTED NOT DETECTED Final   Chlamydophila pneumoniae NOT DETECTED NOT DETECTED Final   Mycoplasma pneumoniae NOT DETECTED NOT DETECTED Final    Comment: Performed at [facility], [address] [place], [place] [address]         Radiology Studies: No results found.          LOS: 3 days   Time spent= 38 mins    Deliliah Room, MD [facility]  If 7PM-7AM, please contact night-coverage  10/24/2023, 10:21 AM

## 2023-10-24 NOTE — TOC Progression Note (Signed)
 Transition of Care [facility]) - Progression Note    Patient Details  Name: Allison Hughes MRN: [id number] Date of Birth: 1950/03/01  Transition of Care [facility]) CM/SW Contact  [name], RN Phone Number: 10/24/2023, 2:14 PM  Clinical Narrative:    CM met with the patient at the bedside and I called the patient's boyfriend on the phone while I was in the room.  Patient lives alone and she was able to answer some of my questions but still remains confused regarding some details for home.  The patient's boyfriend states that he assists the patient in the home from 9 am to 6 pm but is unable to provide the patient with a lot of physical assistance.  I explained to the patient that SNF was recommended and patient was in agreement to go to SNF as long as she did not go to [facility] since she did not receive adequate rehabilitation at the facility.  DME at the home includes a RW.  MSW with IP Care management team is aware that patient has now decided to go to rehabilitation since she is requiring total assistance for ambulation and does not have assistance nor 24 hour supervision in the home.   Expected Discharge Plan: Skilled Nursing Facility Barriers to Discharge: Continued Medical Work up, SNF Pending bed offer               Expected Discharge Plan and Services                                               Social Drivers of Health (SDOH) Interventions SDOH Screenings   Food Insecurity: Patient Unable To Answer (10/20/2023)  Housing: Unknown (10/20/2023)  Transportation Needs: Patient Unable To Answer (10/20/2023)  Utilities: Patient Unable To Answer (10/20/2023)  Depression (PHQ2-9): Low Risk  (10/26/2020)  Social Connections: Patient Unable To Answer (10/20/2023)  Tobacco Use: High Risk (10/20/2023)    Readmission Risk Interventions    10/24/2023    2:14 PM  Readmission Risk Prevention Plan  Transportation Screening Complete  PCP or  Specialist Appt within 3-5 Days Complete  HRI or Home Care Consult Complete  Social Work Consult for Recovery Care Planning/Counseling Complete  Palliative Care Screening Complete  Medication Review Oceanographer) Referral to Pharmacy

## 2023-10-25 DIAGNOSIS — Z515 Encounter for palliative care: Secondary | ICD-10-CM | POA: Diagnosis not present

## 2023-10-25 DIAGNOSIS — Z7189 Other specified counseling: Secondary | ICD-10-CM | POA: Diagnosis not present

## 2023-10-25 DIAGNOSIS — G9341 Metabolic encephalopathy: Secondary | ICD-10-CM | POA: Diagnosis not present

## 2023-10-25 LAB — GLUCOSE, CAPILLARY
Glucose-Capillary: 126 mg/dL — ABNORMAL HIGH (ref 70–99)
Glucose-Capillary: 110 mg/dL — ABNORMAL HIGH (ref 70–99)
Glucose-Capillary: 128 mg/dL — ABNORMAL HIGH (ref 70–99)
Glucose-Capillary: 145 mg/dL — ABNORMAL HIGH (ref 70–99)

## 2023-10-25 NOTE — Progress Notes (Signed)
 PROGRESS NOTE    Allison Hughes  FMW:[id number] DOB: [birth year] DOA: 10/20/2023 PCP: Valma Carwin, MD   Brief Narrative:    73 y.o. female with medical history significant of hypertension, hyperlipidemia, CVA, COPD, diabetes mellitus type 2, GI bleed, neuroendocrine ulcer, and obesity who presents after being found on the floor confused she is being treated for acute metabolic encephalopathy in the setting of urinary tract infection, acute hypoxic respiratory failure secondary to COVID-19 infection and electrolytes are being repleted. Pending SNF placement.  Assessment & Plan:  Principal Problem:   Acute metabolic encephalopathy Active Problems:   Urinary tract infection   Acute respiratory failure with hypoxia (HCC)   Chronic diastolic CHF (congestive heart failure) (HCC)   Fall at home, initial encounter   Essential hypertension   DM (diabetes mellitus), type 2 (HCC)   Hyperlipidemia   History of stroke   History of gastric ulcer   Acute metabolic encephalopathy:  In the setting of baseline cognitive impairment. CT head is negative for acute intracranial abnormality.  MRI brain showing No acute intracranial abnormality. Old left cerebellar infarct. Early confluent white matter T2 hyperintensity consistent with chronic small vessel disease.Generalized cerebral volume loss. EEG did not show any seizures.  Suspect from UTI Finished course of IV ceftriaxone .        Acute hypoxic respiratory failure secondary to COVID Currently, requiring about 3 L of Protivin oxygen.  Positive for COVID-19 infection -Dced corticosteroids on 10/9 -Initial bronchodilator therapy as needed     Left foot and ankle pain and some swelling on the dorsum of the foot X rays of the left foot showed evidence of soft tissue swelling without any acute fractures.   Hypokalemia and hypomagnesemia As needed repletion  Hospital acquired delirium: Frequent reorientation Dced steroids Started on seroquel  25  mg at bedtime on 10/9.    H/o CVA  Continue with PT and OT     GERD Continue with PPI.    Body mass index is 33.1 kg/m. Obesity.      Hyperlipidemia Resumed statin      Controlled type 2 DM  Resume SSI.  A1c is 6.9% Diabetes coordinator on board, appreciate assistance   Disposition: Patient was living at home by herself.  PT/OT consulted.  She will need skilled nursing facility upon discharge.  Palliative care on board.  DVT prophylaxis: enoxaparin  (LOVENOX ) injection 40 mg Start: 10/20/23 1800     Code Status: Full Code Family Communication:  None at the bedside Status is: Inpatient Remains inpatient appropriate because: encephalopathy, COVID, needs placement    Subjective:  She doesn't appear agitated or anxious this morning. She has a Comptroller in place.   Examination:  General exam: Appears confused and disoriented  Respiratory system: clear breath sounds bilaterally Cardiovascular system: S1 & S2 heard, RRR. No JVD, murmurs, rubs, gallops or clicks. No pedal edema. Gastrointestinal system: Abdomen is nondistended, soft and nontender. No organomegaly or masses felt. Normal bowel sounds heard. Central nervous system: Confused and disoriented. No focal neurological deficits. Extremities: Moving all extremities equally Skin: No rashes, lesions or ulcers Psychiatry: confused and disoriented     Diet Orders (From admission, onward)     Start     Ordered   10/20/23 1617  Diet Carb Modified Fluid consistency: Thin; Room service appropriate? Yes  Diet effective now       Question Answer Comment  Diet-HS Snack? Nothing   Calorie Level Medium 1600-2000   Fluid consistency: Thin   Room service appropriate?  Yes      10/20/23 1616            Objective: Vitals:   10/24/23 1141 10/24/23 1549 10/25/23 0710 10/25/23 0900  BP: 135/60 (!) 182/82  (!) 158/86  Pulse: 75 75  76  Resp: 16   16  Temp: 98 F (36.7 C) 98 F (36.7 C)  98.1 F (36.7 C)   TempSrc: Oral   Oral  SpO2: 95% 98%  94%  Weight:   71 kg   Height:        Intake/Output Summary (Last 24 hours) at 10/25/2023 1031 Last data filed at 10/24/2023 1700 Gross per 24 hour  Intake --  Output 900 ml  Net -900 ml    Filed Weights   10/20/23 0933 10/23/23 0500 10/25/23 0710  Weight: 82.1 kg 78.1 kg 71 kg    Scheduled Meds:  atorvastatin   40 mg Oral Daily   budesonide -glycopyrrolate -formoterol   2 puff Inhalation BID   vitamin B-12  500 mcg Oral Daily   enoxaparin  (LOVENOX ) injection  40 mg Subcutaneous Q24H   haloperidol  lactate  1 mg Intravenous Once   insulin  aspart  0-9 Units Subcutaneous TID WC   insulin  glargine  15 Units Subcutaneous QHS   lisinopril   20 mg Oral QHS   Ensure Max Protein  11 oz Oral BID   QUEtiapine   25 mg Oral QHS   sodium chloride  flush  3 mL Intravenous Q12H   Continuous Infusions:  sodium chloride  75 mL/hr at 10/24/23 2243    Nutritional status Signs/Symptoms:  (A1c 6.9, CBGs > 180)   Body mass index is 28.63 kg/m.  Data Reviewed:   CBC: Recent Labs  Lab 10/20/23 1059 10/20/23 1119 10/20/23 1127 10/21/23 0445 10/22/23 0400  WBC  --  6.7  --  6.6 5.5  NEUTROABS  --  5.0  --   --  3.4  HGB 12.6 13.3 15.0 11.1* 11.3*  HCT 37.0 45.0 44.0 36.6 37.5  MCV  --  74.9*  --  73.9* 74.4*  PLT  --  255  --  259 281   Basic Metabolic Panel: Recent Labs  Lab 10/20/23 1059 10/20/23 1127 10/20/23 1217 10/21/23 0445 10/22/23 0400  NA 141 139 139 139 140  K 3.1* 4.3 3.5 2.9* 3.7  CL  --  104 103 105 105  CO2  --   --  23 26 25   GLUCOSE  --  204* 173* 234* 207*  BUN  --  9 8 8 10   CREATININE  --  0.50 0.53 0.69 0.64  CALCIUM   --   --  9.1 8.8* 8.7*  MG  --   --   --  1.4*  --    GFR: Estimated Creatinine Clearance: 58.7 mL/min (by C-G formula based on SCr of 0.64 mg/dL). Liver Function Tests: Recent Labs  Lab 10/20/23 1217 10/21/23 0445 10/22/23 0400  AST 30 25 24   ALT 24 25 23   ALKPHOS 112 92 105  BILITOT 0.8  0.5 0.3  PROT 6.3* 5.6* 6.0*  ALBUMIN 2.9* 2.6* 2.6*   No results for input(s): LIPASE, AMYLASE in the last 168 hours. Recent Labs  Lab 10/20/23 1821  AMMONIA 28   Coagulation Profile: No results for input(s): INR, PROTIME in the last 168 hours. Cardiac Enzymes: Recent Labs  Lab 10/20/23 1217  CKTOTAL 179   BNP (last 3 results) No results for input(s): PROBNP in the last 8760 hours. HbA1C: No results for input(s): HGBA1C in the last  72 hours. CBG: Recent Labs  Lab 10/23/23 1539 10/24/23 1142 10/24/23 1548 10/24/23 2043 10/25/23 0821  GLUCAP 276* 233* 212* 120* 126*   Lipid Profile: No results for input(s): CHOL, HDL, LDLCALC, TRIG, CHOLHDL, LDLDIRECT in the last 72 hours. Thyroid Function Tests: No results for input(s): TSH, T4TOTAL, FREET4, T3FREE, THYROIDAB in the last 72 hours.  Anemia Panel: No results for input(s): VITAMINB12, FOLATE, FERRITIN, TIBC, IRON , RETICCTPCT in the last 72 hours.  Sepsis Labs: Recent Labs  Lab 10/20/23 1821  PROCALCITON <0.10    Recent Results (from the past 240 hours)  SARS Coronavirus 2 by RT PCR (hospital order, performed in [facility] hospital lab) *cepheid single result test* Anterior Nasal Swab     Status: Abnormal   Collection Time: 10/21/23  2:50 PM   Specimen: Anterior Nasal Swab  Result Value Ref Range Status   SARS Coronavirus 2 by RT PCR POSITIVE (A) NEGATIVE Final    Comment: Performed at [facility], [address] [place], [place] [address]  Respiratory (~20 pathogens) panel by PCR     Status: None   Collection Time: 10/21/23  2:50 PM   Specimen: Nasopharyngeal Swab; Respiratory  Result Value Ref Range Status   Adenovirus NOT DETECTED NOT DETECTED Final   Coronavirus 229E NOT DETECTED NOT DETECTED Final    Comment: (NOTE) The Coronavirus on the Respiratory Panel, DOES NOT test for the novel  Coronavirus (2019 nCoV)    Coronavirus HKU1 NOT DETECTED NOT  DETECTED Final   Coronavirus NL63 NOT DETECTED NOT DETECTED Final   Coronavirus OC43 NOT DETECTED NOT DETECTED Final   Metapneumovirus NOT DETECTED NOT DETECTED Final   Rhinovirus / Enterovirus NOT DETECTED NOT DETECTED Final   Influenza A NOT DETECTED NOT DETECTED Final   Influenza B NOT DETECTED NOT DETECTED Final   Parainfluenza Virus 1 NOT DETECTED NOT DETECTED Final   Parainfluenza Virus 2 NOT DETECTED NOT DETECTED Final   Parainfluenza Virus 3 NOT DETECTED NOT DETECTED Final   Parainfluenza Virus 4 NOT DETECTED NOT DETECTED Final   Respiratory Syncytial Virus NOT DETECTED NOT DETECTED Final   Bordetella pertussis NOT DETECTED NOT DETECTED Final   Bordetella Parapertussis NOT DETECTED NOT DETECTED Final   Chlamydophila pneumoniae NOT DETECTED NOT DETECTED Final   Mycoplasma pneumoniae NOT DETECTED NOT DETECTED Final    Comment: Performed at [facility], [address], [place], [place] [address]         Radiology Studies: No results found.      LOS: 4 days   Time spent= 38 mins    Deliliah Room, MD [facility]  If 7PM-7AM, please contact night-coverage  10/25/2023, 10:31 AM

## 2023-10-25 NOTE — Plan of Care (Signed)

## 2023-10-25 NOTE — Progress Notes (Signed)
 Daily Progress Note   Patient Name: Allison Hughes       Date: 10/25/2023 DOB: [birth year]  Age: 73 y.o. MRN#: [id number] Attending Physician: Allison Antu, MD Primary Care Physician: Allison Carwin, MD Admit Date: 10/20/2023  Reason for Consultation/Follow-up: Establishing goals of care  Length of Stay: 4  Current Medications: Scheduled Meds:   atorvastatin   40 mg Oral Daily   budesonide -glycopyrrolate -formoterol   2 puff Inhalation BID   vitamin B-12  500 mcg Oral Daily   enoxaparin  (LOVENOX ) injection  40 mg Subcutaneous Q24H   haloperidol  lactate  1 mg Intravenous Once   insulin  aspart  0-9 Units Subcutaneous TID WC   insulin  glargine  15 Units Subcutaneous QHS   lisinopril   20 mg Oral QHS   Ensure Max Protein  11 oz Oral BID   QUEtiapine   25 mg Oral QHS   sodium chloride  flush  3 mL Intravenous Q12H    Continuous Infusions:  sodium chloride  75 mL/hr at 10/24/23 2243    PRN Meds: acetaminophen  **OR** acetaminophen , albuterol , ALPRAZolam , haloperidol  lactate, ondansetron  **OR** ondansetron  (ZOFRAN ) IV  Physical Exam Vitals reviewed.  Constitutional:      General: She is not in acute distress. HENT:     Head: Normocephalic and atraumatic.  Cardiovascular:     Rate and Rhythm: Normal rate.  Pulmonary:     Effort: Pulmonary effort is normal.  Skin:    General: Skin is warm and dry.  Neurological:     Mental Status: She is disoriented and confused.             Vital Signs: BP (!) 158/75 (BP Location: Left Arm)   Pulse 84   Temp 97.7 F (36.5 C) (Oral)   Resp 18   Ht 5' 2 (1.575 m)   Wt 71 kg   SpO2 95%   BMI 28.63 kg/m  SpO2: SpO2: 95 % O2 Device: O2 Device: Nasal Cannula O2 Flow Rate: O2 Flow Rate (L/min): 2 L/min    Patient Active Problem List    Diagnosis Date Noted   Acute metabolic encephalopathy 10/20/2023   Urinary tract infection 10/20/2023   Chronic diastolic CHF (congestive heart failure) (HCC) 10/20/2023   Fall at home, initial encounter 10/20/2023   Hyperlipidemia 10/20/2023   History of gastric ulcer 10/20/2023   Acute respiratory failure with  hypoxia (HCC) 09/20/2023   ABLA (acute blood loss anemia) 09/19/2023   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 She is being treated for acute metabolic encephalopathy in the setting of UTI and acute hypoxic respiratory failure secondary to Covid-19 infection.   Today's Discussion: Patient sitting up in bed in NAD watching television. She is more confused today than yesterday. There was a partially eaten breakfast tray in the room. She told me that her dinner was not brought to her and that she was supposed to leave with me but the plans have changed. Attempted to reorient patient.  Spoke with Allison Hughes by phone. He spoke to the patient this morning. We discussed the patient's increased confusion when I saw her. Discussed delirium precautions. Allison Hughes is grateful for the update.  Emotional support and therapeutic listening provided. Discussed the importance of continued conversation with family and the medical providers regarding overall plan of care and treatment options, ensuring decisions are within the context of the patient's values and GOCs. PMT will continue to support.  Recommendations/Plan: Full  code Full scope Time for outcomes- Hopeful for patient to return to baseline mental status to participate in goals of care discussions SNF with rehab at discharge PMT to support    Code Status:    Code Status Orders  (From admission, onward)           Start     Ordered   10/20/23 1617  Full code  Continuous       Question:  By:  Answer:  Consent: discussion documented in EHR   10/20/23 1616         Extensive chart review has been completed prior to seeing the patient including labs, vital signs, imaging, progress/consult notes, orders, medications, and available advance directive documents.  Time spent: 25 minutes   Thank you for allowing the Palliative Medicine Team to assist in the care of this patient.    Allison CHRISTELLA Palin, NP  Please contact Palliative Medicine Team phone at [phone] for questions and concerns.

## 2023-10-26 DIAGNOSIS — G9341 Metabolic encephalopathy: Secondary | ICD-10-CM | POA: Diagnosis not present

## 2023-10-26 DIAGNOSIS — Z515 Encounter for palliative care: Secondary | ICD-10-CM | POA: Diagnosis not present

## 2023-10-26 LAB — GLUCOSE, CAPILLARY
Glucose-Capillary: 151 mg/dL — ABNORMAL HIGH (ref 70–99)
Glucose-Capillary: 175 mg/dL — ABNORMAL HIGH (ref 70–99)
Glucose-Capillary: 168 mg/dL — ABNORMAL HIGH (ref 70–99)
Glucose-Capillary: 110 mg/dL — ABNORMAL HIGH (ref 70–99)

## 2023-10-26 NOTE — Progress Notes (Signed)
 Daily Progress Note   Allison Hughes Name: Allison Hughes       Date: 10/26/2023 DOB: 1950/11/14  Age: 73 y.o. MRN#: [id number] Attending Physician: Dino Antu, MD Primary Care Physician: Valma Carwin, MD Admit Date: 10/20/2023  Reason for Consultation/Follow-up: Establishing goals of care  Length of Stay: 5  Current Medications: Scheduled Meds:   atorvastatin   40 mg Oral Daily   budesonide -glycopyrrolate -formoterol   2 puff Inhalation BID   vitamin B-12  500 mcg Oral Daily   enoxaparin  (LOVENOX ) injection  40 mg Subcutaneous Q24H   haloperidol  lactate  1 mg Intravenous Once   insulin  aspart  0-9 Units Subcutaneous TID WC   insulin  glargine  15 Units Subcutaneous QHS   lisinopril   20 mg Oral QHS   Ensure Max Protein  11 oz Oral BID   QUEtiapine   25 mg Oral QHS   sodium chloride  flush  3 mL Intravenous Q12H    Continuous Infusions:  sodium chloride  75 mL/hr at 10/24/23 2243    PRN Meds: acetaminophen  **OR** acetaminophen , albuterol , ALPRAZolam , haloperidol  lactate, ondansetron  **OR** ondansetron  (ZOFRAN ) IV  Physical Exam Vitals reviewed.  Constitutional:      General: Allison Hughes is not in acute distress. HENT:     Head: Normocephalic and atraumatic.  Cardiovascular:     Rate and Rhythm: Normal rate.  Pulmonary:     Effort: Pulmonary effort is normal.  Skin:    General: Skin is warm and dry.     Findings: Bruising (left foot) present.  Neurological:     Mental Status: Allison Hughes is disoriented and confused.             Vital Signs: BP (!) 159/86 (BP Location: Right Arm)   Pulse 99   Temp 98.1 F (36.7 C) (Axillary)   Resp 20   Ht 5' 2 (1.575 m)   Wt 69.6 kg   SpO2 95%   BMI 28.06 kg/m  SpO2: SpO2: 95 % O2 Device: O2 Device: Nasal Cannula O2 Flow Rate: O2 Flow Rate  (L/min): 2 L/min    Allison Hughes Active Problem List   Diagnosis Date Noted   Acute metabolic encephalopathy 10/20/2023   Urinary tract infection 10/20/2023   Chronic diastolic CHF (congestive heart failure) (HCC) 10/20/2023   Fall at home, initial encounter 10/20/2023   Hyperlipidemia 10/20/2023   History of  gastric ulcer 10/20/2023   Acute respiratory failure with hypoxia (HCC) 09/20/2023   ABLA (acute blood loss anemia) 09/19/2023   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 Allison Hughes is being treated for acute metabolic encephalopathy in the setting of UTI and acute hypoxic respiratory failure secondary to Covid-19 infection.   Today's Discussion: Reviewed chart and received update from nursing. Allison Hughes ate more today than yesterday. Allison Hughes sitting up in bed. Allison Hughes confused and has removed Allison Hughes telemetry leads and gown. Nursing at bedside. Allison Hughes Hughes unable to have meaningful goals of care discussions.  Spoke with Phil by phone. I shared my concern that the Allison Hughes's mental status has not improved. He shared Allison Hughes has had a lot going on medically over the last few weeks. We discussed Allison Hughes decreased reserve. Allison Hughes is hopeful Allison Hughes will be able to work with PT tomorrow. Allison Hughes is grateful for the update.  Emotional support and therapeutic listening provided. Discussed the importance of continued conversation with family and the medical providers regarding overall plan of care and treatment options,  ensuring decisions are within the context of the Allison Hughes's values and GOCs. PMT will continue to support.  Recommendations/Plan: Full code Full scope Time for outcomes SNF with rehab at discharge PMT to support    Code Status:    Code Status Orders  (From admission, onward)           Start     Ordered   10/20/23 1617  Full code  Continuous       Question:  By:  Answer:  Consent: discussion documented in EHR   10/20/23 1616         Extensive chart review has been completed prior to seeing the Allison Hughes including labs, vital signs, imaging, progress/consult notes, orders, medications, and available advance directive documents.  Time spent: 25 minutes   Thank you for allowing the Palliative Medicine Team to assist in the care of this Allison Hughes.    Stephane CHRISTELLA Palin, NP  Please contact Palliative Medicine Team phone at [phone] for questions and concerns.

## 2023-10-26 NOTE — Plan of Care (Signed)
  Problem: Fluid Volume: Goal: Ability to maintain a balanced intake and output will improve Outcome: Progressing   Problem: Metabolic: Goal: Ability to maintain appropriate glucose levels will improve Outcome: Progressing   Problem: Nutritional: Goal: Maintenance of adequate nutrition will improve Outcome: Progressing Goal: Progress toward achieving an optimal weight will improve Outcome: Progressing   Problem: Skin Integrity: Goal: Risk for impaired skin integrity will decrease Outcome: Progressing   Problem: Tissue Perfusion: Goal: Adequacy of tissue perfusion will improve Outcome: Progressing   Problem: Clinical Measurements: Goal: Ability to maintain clinical measurements within normal limits will improve Outcome: Progressing Goal: Will remain free from infection Outcome: Progressing Goal: Diagnostic test results will improve Outcome: Progressing Goal: Respiratory complications will improve Outcome: Progressing Goal: Cardiovascular complication will be avoided Outcome: Progressing   Problem: Activity: Goal: Risk for activity intolerance will decrease Outcome: Progressing   Problem: Nutrition: Goal: Adequate nutrition will be maintained Outcome: Progressing   Problem: Elimination: Goal: Will not experience complications related to bowel motility Outcome: Progressing Goal: Will not experience complications related to urinary retention Outcome: Progressing   Problem: Pain Managment: Goal: General experience of comfort will improve and/or be controlled Outcome: Progressing   Problem: Safety: Goal: Ability to remain free from injury will improve Outcome: Progressing   Problem: Skin Integrity: Goal: Risk for impaired skin integrity will decrease Outcome: Progressing   Problem: Safety: Goal: Non-violent Restraint(s) Outcome: Progressing   Problem: Education: Goal: Ability to describe self-care measures that may prevent or decrease complications (Diabetes  Survival Skills Education) will improve Outcome: Not Progressing Goal: Individualized Educational Video(s) Outcome: Not Progressing   Problem: Coping: Goal: Ability to adjust to condition or change in health will improve Outcome: Not Progressing   Problem: Health Behavior/Discharge Planning: Goal: Ability to identify and utilize available resources and services will improve Outcome: Not Progressing Goal: Ability to manage health-related needs will improve Outcome: Not Progressing   Problem: Education: Goal: Knowledge of General Education information will improve Description: Including pain rating scale, medication(s)/side effects and non-pharmacologic comfort measures Outcome: Not Progressing   Problem: Health Behavior/Discharge Planning: Goal: Ability to manage health-related needs will improve Outcome: Not Progressing   Problem: Coping: Goal: Level of anxiety will decrease Outcome: Not Progressing

## 2023-10-26 NOTE — Progress Notes (Signed)
 PROGRESS NOTE    Allison Hughes  FMW:[id number] DOB: 30-Nov-1950 DOA: 10/20/2023 PCP: Valma Carwin, MD   Brief Narrative:    73 y.o. female with medical history significant of hypertension, hyperlipidemia, CVA, COPD, diabetes mellitus type 2, GI bleed, neuroendocrine ulcer, and obesity who presents after being found on the floor confused she is being treated for acute metabolic encephalopathy in the setting of urinary tract infection, acute hypoxic respiratory failure secondary to COVID-19 infection and electrolytes are being repleted. Pending SNF placement.  Assessment & Plan:  Principal Problem:   Acute metabolic encephalopathy Active Problems:   Urinary tract infection   Acute respiratory failure with hypoxia (HCC)   Chronic diastolic CHF (congestive heart failure) (HCC)   Fall at home, initial encounter   Essential hypertension   DM (diabetes mellitus), type 2 (HCC)   Hyperlipidemia   History of stroke   History of gastric ulcer   Acute metabolic encephalopathy:  In the setting of baseline cognitive impairment. CT head is negative for acute intracranial abnormality.  MRI brain showing No acute intracranial abnormality. Old left cerebellar infarct. Early confluent white matter T2 hyperintensity consistent with chronic small vessel disease.Generalized cerebral volume loss. EEG did not show any seizures.  Suspect from UTI Finished course of IV ceftriaxone .        Acute hypoxic respiratory failure secondary to COVID Currently, requiring about 3 L of [place] oxygen.  Positive for COVID-19 infection -Dced corticosteroids on 10/9 -Initial bronchodilator therapy as needed     Left foot and ankle pain and some swelling on the dorsum of the foot X rays of the left foot showed evidence of soft tissue swelling without any acute fractures.   Hypokalemia and hypomagnesemia As needed repletion  Hospital acquired delirium: Frequent reorientation Dced steroids Started on seroquel  25  mg at bedtime on 10/9.    H/o CVA  Continue with PT and OT     GERD Continue with PPI.    Body mass index is 33.1 kg/m. Obesity.      Hyperlipidemia Resumed statin      Controlled type 2 DM  Resume SSI.  A1c is 6.9% Diabetes coordinator on board, appreciate assistance   Disposition: Patient was living at home by herself.  PT/OT consulted.  She will need skilled nursing facility upon discharge.  Palliative care on board.  DVT prophylaxis: enoxaparin  (LOVENOX ) injection 40 mg Start: 10/20/23 1800     Code Status: Full Code Family Communication:  None at the bedside Status is: Inpatient Remains inpatient appropriate because: encephalopathy, COVID, needs placement    Subjective:  No acute events overnight. Resting comfortably in bed.  Examination:  General exam: Appears confused and disoriented  Respiratory system: clear breath sounds bilaterally Cardiovascular system: S1 & S2 heard, RRR. No JVD, murmurs, rubs, gallops or clicks. No pedal edema. Gastrointestinal system: Abdomen is nondistended, soft and nontender. No organomegaly or masses felt. Normal bowel sounds heard. Central nervous system: Confused and disoriented. No focal neurological deficits. Extremities: Moving all extremities equally Skin: No rashes, lesions or ulcers Psychiatry: confused and disoriented     Diet Orders (From admission, onward)     Start     Ordered   10/20/23 1617  Diet Carb Modified Fluid consistency: Thin; Room service appropriate? Yes  Diet effective now       Question Answer Comment  Diet-HS Snack? Nothing   Calorie Level Medium 1600-2000   Fluid consistency: Thin   Room service appropriate? Yes      10/20/23  1616            Objective: Vitals:   10/25/23 2353 10/26/23 0500 10/26/23 0501 10/26/23 0750  BP: 137/73  (!) 153/95 (!) 148/78  Pulse: 80  88 76  Resp: 16  16 16   Temp: 98.4 F (36.9 C)  98.8 F (37.1 C) 97.6 F (36.4 C)  TempSrc: Oral   Oral   SpO2: 94%  95% 95%  Weight:  69.6 kg    Height:        Intake/Output Summary (Last 24 hours) at 10/26/2023 0917 Last data filed at 10/25/2023 2200 Gross per 24 hour  Intake 200 ml  Output 1200 ml  Net -1000 ml    Filed Weights   10/23/23 0500 10/25/23 0710 10/26/23 0500  Weight: 78.1 kg 71 kg 69.6 kg    Scheduled Meds:  atorvastatin   40 mg Oral Daily   budesonide -glycopyrrolate -formoterol   2 puff Inhalation BID   vitamin B-12  500 mcg Oral Daily   enoxaparin  (LOVENOX ) injection  40 mg Subcutaneous Q24H   haloperidol  lactate  1 mg Intravenous Once   insulin  aspart  0-9 Units Subcutaneous TID WC   insulin  glargine  15 Units Subcutaneous QHS   lisinopril   20 mg Oral QHS   Ensure Max Protein  11 oz Oral BID   QUEtiapine   25 mg Oral QHS   sodium chloride  flush  3 mL Intravenous Q12H   Continuous Infusions:  sodium chloride  75 mL/hr at 10/24/23 2243    Nutritional status Signs/Symptoms:  (A1c 6.9, CBGs > 180)   Body mass index is 28.06 kg/m.  Data Reviewed:   CBC: Recent Labs  Lab 10/20/23 1059 10/20/23 1119 10/20/23 1127 10/21/23 0445 10/22/23 0400  WBC  --  6.7  --  6.6 5.5  NEUTROABS  --  5.0  --   --  3.4  HGB 12.6 13.3 15.0 11.1* 11.3*  HCT 37.0 45.0 44.0 36.6 37.5  MCV  --  74.9*  --  73.9* 74.4*  PLT  --  255  --  259 281   Basic Metabolic Panel: Recent Labs  Lab 10/20/23 1059 10/20/23 1127 10/20/23 1217 10/21/23 0445 10/22/23 0400  NA 141 139 139 139 140  K 3.1* 4.3 3.5 2.9* 3.7  CL  --  104 103 105 105  CO2  --   --  23 26 25   GLUCOSE  --  204* 173* 234* 207*  BUN  --  9 8 8 10   CREATININE  --  0.50 0.53 0.69 0.64  CALCIUM   --   --  9.1 8.8* 8.7*  MG  --   --   --  1.4*  --    GFR: Estimated Creatinine Clearance: 58.1 mL/min (by C-G formula based on SCr of 0.64 mg/dL). Liver Function Tests: Recent Labs  Lab 10/20/23 1217 10/21/23 0445 10/22/23 0400  AST 30 25 24   ALT 24 25 23   ALKPHOS 112 92 105  BILITOT 0.8 0.5 0.3  PROT  6.3* 5.6* 6.0*  ALBUMIN 2.9* 2.6* 2.6*   No results for input(s): LIPASE, AMYLASE in the last 168 hours. Recent Labs  Lab 10/20/23 1821  AMMONIA 28   Coagulation Profile: No results for input(s): INR, PROTIME in the last 168 hours. Cardiac Enzymes: Recent Labs  Lab 10/20/23 1217  CKTOTAL 179   BNP (last 3 results) No results for input(s): PROBNP in the last 8760 hours. HbA1C: No results for input(s): HGBA1C in the last 72 hours. CBG: Recent Labs  Lab 10/25/23 0821 10/25/23 1133 10/25/23 1539 10/25/23 2052 10/26/23 0746  GLUCAP 126* 110* 128* 145* 151*   Lipid Profile: No results for input(s): CHOL, HDL, LDLCALC, TRIG, CHOLHDL, LDLDIRECT in the last 72 hours. Thyroid Function Tests: No results for input(s): TSH, T4TOTAL, FREET4, T3FREE, THYROIDAB in the last 72 hours.  Anemia Panel: No results for input(s): VITAMINB12, FOLATE, FERRITIN, TIBC, IRON , RETICCTPCT in the last 72 hours.  Sepsis Labs: Recent Labs  Lab 10/20/23 1821  PROCALCITON <0.10    Recent Results (from the past 240 hours)  SARS Coronavirus 2 by RT PCR (hospital order, performed in [facility] hospital lab) *cepheid single result test* Anterior Nasal Swab     Status: Abnormal   Collection Time: 10/21/23  2:50 PM   Specimen: Anterior Nasal Swab  Result Value Ref Range Status   SARS Coronavirus 2 by RT PCR POSITIVE (A) NEGATIVE Final    Comment: Performed at [facility], [address], [place], [place] [address]  Respiratory (~20 pathogens) panel by PCR     Status: None   Collection Time: 10/21/23  2:50 PM   Specimen: Nasopharyngeal Swab; Respiratory  Result Value Ref Range Status   Adenovirus NOT DETECTED NOT DETECTED Final   Coronavirus 229E NOT DETECTED NOT DETECTED Final    Comment: (NOTE) The Coronavirus on the Respiratory Panel, DOES NOT test for the novel  Coronavirus (2019 nCoV)    Coronavirus HKU1 NOT DETECTED NOT DETECTED Final    Coronavirus NL63 NOT DETECTED NOT DETECTED Final   Coronavirus OC43 NOT DETECTED NOT DETECTED Final   Metapneumovirus NOT DETECTED NOT DETECTED Final   Rhinovirus / Enterovirus NOT DETECTED NOT DETECTED Final   Influenza A NOT DETECTED NOT DETECTED Final   Influenza B NOT DETECTED NOT DETECTED Final   Parainfluenza Virus 1 NOT DETECTED NOT DETECTED Final   Parainfluenza Virus 2 NOT DETECTED NOT DETECTED Final   Parainfluenza Virus 3 NOT DETECTED NOT DETECTED Final   Parainfluenza Virus 4 NOT DETECTED NOT DETECTED Final   Respiratory Syncytial Virus NOT DETECTED NOT DETECTED Final   Bordetella pertussis NOT DETECTED NOT DETECTED Final   Bordetella Parapertussis NOT DETECTED NOT DETECTED Final   Chlamydophila pneumoniae NOT DETECTED NOT DETECTED Final   Mycoplasma pneumoniae NOT DETECTED NOT DETECTED Final    Comment: Performed at [facility], [address] [place], [place] [address]         Radiology Studies: No results found.      LOS: 5 days   Time spent= 38 mins    Deliliah Room, MD [facility]  If 7PM-7AM, please contact night-coverage  10/26/2023, 9:17 AM

## 2023-10-27 DIAGNOSIS — G9341 Metabolic encephalopathy: Secondary | ICD-10-CM | POA: Diagnosis not present

## 2023-10-27 LAB — GLUCOSE, CAPILLARY
Glucose-Capillary: 155 mg/dL — ABNORMAL HIGH (ref 70–99)
Glucose-Capillary: 232 mg/dL — ABNORMAL HIGH (ref 70–99)
Glucose-Capillary: 153 mg/dL — ABNORMAL HIGH (ref 70–99)
Glucose-Capillary: 155 mg/dL — ABNORMAL HIGH (ref 70–99)

## 2023-10-27 NOTE — Plan of Care (Signed)

## 2023-10-27 NOTE — Progress Notes (Signed)
 Physical Therapy Treatment Patient Details Name: Allison Hughes MRN: [id number] DOB: 1950/02/15 Today's Date: 10/27/2023   History of Present Illness 73 yo female found down on floor 10/6. Recent hospitalization 9/5-9/10 found on floor hypoxic with PNA and UTI d/c to [facility].  Currently pt with confusion, paranoid thoughts and forgetfulness. Found to have COVID and acute metabolic encephalopathy. PMH HTN HLD CVA COPD DM2 GIB, Obesity    PT Comments  Pt awake on entry but very withdrawn. Eventually with multimodal cuing pt comes to long sitting in the bed and then puts her LE over the edge. Pt repeatedly saying Macaroni and cheese Eventually looks out the window and starts to talk about birds. Does not answer any of PT questions. Despite increased multimodal cuing pt does not attempt standing. And then with cues to she returns LE to bed. D/c plans remain appropriate. PT will continue to follow acutely.     If plan is discharge home, recommend the following: A lot of help with walking and/or transfers;A lot of help with bathing/dressing/bathroom;Assistance with cooking/housework;Direct supervision/assist for medications management;Direct supervision/assist for financial management;Assist for transportation;Assistance with feeding;Help with stairs or ramp for entrance;Supervision due to cognitive status   Can travel by private vehicle     Yes  Equipment Recommendations  Wheelchair (measurements PT);Wheelchair cushion (measurements PT)       Precautions / Restrictions Precautions Precautions: Fall Recall of Precautions/Restrictions: Impaired Precaution/Restrictions Comments: AMS, COVID precautions, watch SpO2 Restrictions Weight Bearing Restrictions Per Provider Order: No     Mobility  Bed Mobility Overal bed mobility: Needs Assistance Bed Mobility: Supine to Sit, Sit to Supine     Supine to sit: HOB elevated, Supervision Sit to supine: Contact guard assist, HOB  elevated   General bed mobility comments: pt comes to long sitting with supervision and is able to then walk her LE to EoB, tactile cues for returning LE to bed when she does not want to get up    Transfers                   General transfer comment: pt does not want to get up but can not state why       Balance Overall balance assessment: Needs assistance Sitting-balance support: Feet supported, No upper extremity supported Sitting balance-Leahy Scale: Fair Sitting balance - Comments: able to static sit but given CGA for safety                                    Communication Communication Communication: Impaired (per prior notes) Factors Affecting Communication: Hearing impaired  Cognition Arousal: Alert Behavior During Therapy: Flat affect   PT - Cognitive impairments: No family/caregiver present to determine baseline, History of cognitive impairments, Orientation, Attention, Sequencing, Awareness   Orientation impairments: Place, Time, Situation                   PT - Cognition Comments: pt withdrawn this afternoon, not much response to questions or commands. Following commands: Impaired Following commands impaired: Follows one step commands inconsistently, Follows one step commands with increased time    Cueing Cueing Techniques: Verbal cues, Gestural cues, Tactile cues, Visual cues     General Comments General comments (skin integrity, edema, etc.): VSS on 3L O2 via [place]      Pertinent Vitals/Pain Pain Assessment Pain Assessment: No/denies pain     PT Goals (current goals can now  be found in the care plan section) Acute Rehab PT Goals Patient Stated Goal: to take care of her dog PT Goal Formulation: With patient Time For Goal Achievement: 11/04/23 Potential to Achieve Goals: Fair Progress towards PT goals: Not progressing toward goals - comment    Frequency    Min 2X/week       AM-PAC PT 6 Clicks Mobility   Outcome  Measure  Help needed turning from your back to your side while in a flat bed without using bedrails?: A Little Help needed moving from lying on your back to sitting on the side of a flat bed without using bedrails?: A Little Help needed moving to and from a bed to a chair (including a wheelchair)?: A Lot Help needed standing up from a chair using your arms (e.g., wheelchair or bedside chair)?: A Lot Help needed to walk in hospital room?: Total (<20 ft) Help needed climbing 3-5 steps with a railing? : Total 6 Click Score: 12    End of Session Equipment Utilized During Treatment: Oxygen Activity Tolerance: Patient tolerated treatment well Patient left: in bed;with call bell/phone within reach;with bed alarm set Nurse Communication: Mobility status PT Visit Diagnosis: Muscle weakness (generalized) (M62.81);Repeated falls (R29.6);History of falling (Z91.81);Difficulty in walking, not elsewhere classified (R26.2);Pain;Unsteadiness on feet (R26.81);Other abnormalities of gait and mobility (R26.89) Pain - Right/Left: Left Pain - part of body: Ankle and joints of foot     Time: 1536-1550 PT Time Calculation (min) (ACUTE ONLY): 14 min  Charges:    $Therapeutic Activity: 8-22 mins PT General Charges $$ ACUTE PT VISIT: 1 Visit                     America Sandall B. Fleeta Lapidus PT, DPT Acute Rehabilitation Services Please use secure chat or  Call Office [phone]    Almarie KATHEE Fleeta [facility] 10/27/2023, 4:27 PM

## 2023-10-27 NOTE — Progress Notes (Signed)
 Mobility Specialist: Progress Note   10/27/23 1627  Mobility  Activity Ambulated with assistance  Level of Assistance Maximum assist, patient does 25-49%  Assistive Device Front wheel walker  Distance Ambulated (ft) 10 ft  Activity Response Tolerated well  Mobility Referral Yes  Mobility visit 1 Mobility  Mobility Specialist Start Time (ACUTE ONLY) 1000  Mobility Specialist Stop Time (ACUTE ONLY) 1029  Mobility Specialist Time Calculation (min) (ACUTE ONLY) 29 min    Pt received in bed, pleasantly confused and agreeable to mobility session. ModA for bed mobility to assist trunk and LE. MinA for STS. MaxA for ambulation, minA for physical assist but maxA for verbal and tactile cues. Pt les engaged this session and requiring physical assist to initiate movements. Max verbal cues to correct R sided gaze this session. Ambulated around the bed to the chair. Left in chair with all needs met, call bell in reach. Telesitter in room and chair alarm on.   [name] Mobility Specialist Please contact via SecureChat or Rehab office at [phone]

## 2023-10-27 NOTE — Plan of Care (Signed)
  Problem: Coping: Goal: Ability to adjust to condition or change in health will improve Outcome: Progressing   Problem: Health Behavior/Discharge Planning: Goal: Ability to identify and utilize available resources and services will improve Outcome: Progressing Goal: Ability to manage health-related needs will improve Outcome: Progressing   Problem: Metabolic: Goal: Ability to maintain appropriate glucose levels will improve Outcome: Progressing

## 2023-10-27 NOTE — Progress Notes (Signed)
 PROGRESS NOTE    Allison Hughes  FMW:[id number] DOB: 13-Jun-1950 DOA: 10/20/2023 PCP: Valma Carwin, MD   Brief Narrative:    73 y.o. female with medical history significant of hypertension, hyperlipidemia, CVA, COPD, diabetes mellitus type 2, GI bleed, neuroendocrine ulcer, and obesity who presents after being found on the floor confused she is being treated for acute metabolic encephalopathy in the setting of urinary tract infection, acute hypoxic respiratory failure secondary to COVID-19 infection and electrolytes are being repleted. Pending SNF placement.  Assessment & Plan:  Principal Problem:   Acute metabolic encephalopathy Active Problems:   Urinary tract infection   Acute respiratory failure with hypoxia (HCC)   Chronic diastolic CHF (congestive heart failure) (HCC)   Fall at home, initial encounter   Essential hypertension   DM (diabetes mellitus), type 2 (HCC)   Hyperlipidemia   History of stroke   History of gastric ulcer   Acute metabolic encephalopathy:  In the setting of baseline cognitive impairment. CT head is negative for acute intracranial abnormality.  MRI brain showing No acute intracranial abnormality. Old left cerebellar infarct. Early confluent white matter T2 hyperintensity consistent with chronic small vessel disease.Generalized cerebral volume loss. EEG did not show any seizures.  Suspect from UTI Finished course of IV ceftriaxone .        Acute hypoxic respiratory failure secondary to COVID Currently, requiring about 3 L of [place] oxygen.  Positive for COVID-19 infection -Dced corticosteroids on 10/9 -Initial bronchodilator therapy as needed     Left foot and ankle pain and some swelling on the dorsum of the foot X rays of the left foot showed evidence of soft tissue swelling without any acute fractures.   Hypokalemia and hypomagnesemia As needed repletion  Hospital acquired delirium: Frequent reorientation Dced steroids Started on seroquel  25  mg at bedtime on 10/9.    H/o CVA  Continue with PT and OT     GERD Continue with PPI.    Body mass index is 33.1 kg/m. Obesity.      Hyperlipidemia Resumed statin      Controlled type 2 DM  Resume SSI.  A1c is 6.9% Diabetes coordinator on board, appreciate assistance   Disposition: Patient was living at home by herself.  PT/OT consulted.  She will need skilled nursing facility upon discharge.  Palliative care on board.  DVT prophylaxis: enoxaparin  (LOVENOX ) injection 40 mg Start: 10/20/23 1800     Code Status: Full Code Family Communication:  None at the bedside Status is: Inpatient Remains inpatient appropriate because: encephalopathy, COVID, needs placement    Subjective:  No acute events overnight. Still appears confused and disoriented but not agitated.  Examination:  General exam: Appears confused and disoriented  Respiratory system: clear breath sounds bilaterally Cardiovascular system: S1 & S2 heard, RRR. No JVD, murmurs, rubs, gallops or clicks. No pedal edema. Gastrointestinal system: Abdomen is nondistended, soft and nontender. No organomegaly or masses felt. Normal bowel sounds heard. Central nervous system: Confused and disoriented. No focal neurological deficits. Extremities: Moving all extremities equally Skin: No rashes, lesions or ulcers Psychiatry: confused and disoriented     Diet Orders (From admission, onward)     Start     Ordered   10/20/23 1617  Diet Carb Modified Fluid consistency: Thin; Room service appropriate? Yes  Diet effective now       Question Answer Comment  Diet-HS Snack? Nothing   Calorie Level Medium 1600-2000   Fluid consistency: Thin   Room service appropriate? Yes  10/20/23 1616            Objective: Vitals:   10/27/23 0002 10/27/23 0452 10/27/23 0757 10/27/23 0824  BP: (!) 157/83 138/82  (!) 152/84  Pulse: 91 87  84  Resp: 16 16    Temp: 97.7 F (36.5 C) 97.8 F (36.6 C)  98.5 F (36.9 C)   TempSrc: Oral Oral  Oral  SpO2: 95% 93% 98% 97%  Weight:      Height:        Intake/Output Summary (Last 24 hours) at 10/27/2023 0949 Last data filed at 10/27/2023 0700 Gross per 24 hour  Intake --  Output 200 ml  Net -200 ml    Filed Weights   10/23/23 0500 10/25/23 0710 10/26/23 0500  Weight: 78.1 kg 71 kg 69.6 kg    Scheduled Meds:  atorvastatin   40 mg Oral Daily   budesonide -glycopyrrolate -formoterol   2 puff Inhalation BID   vitamin B-12  500 mcg Oral Daily   enoxaparin  (LOVENOX ) injection  40 mg Subcutaneous Q24H   haloperidol  lactate  1 mg Intravenous Once   insulin  aspart  0-9 Units Subcutaneous TID WC   insulin  glargine  15 Units Subcutaneous QHS   lisinopril   20 mg Oral QHS   Ensure Max Protein  11 oz Oral BID   QUEtiapine   25 mg Oral QHS   sodium chloride  flush  3 mL Intravenous Q12H   Continuous Infusions:  sodium chloride  75 mL/hr at 10/26/23 2238    Nutritional status Signs/Symptoms:  (A1c 6.9, CBGs > 180)   Body mass index is 28.06 kg/m.  Data Reviewed:   CBC: Recent Labs  Lab 10/20/23 1059 10/20/23 1119 10/20/23 1127 10/21/23 0445 10/22/23 0400  WBC  --  6.7  --  6.6 5.5  NEUTROABS  --  5.0  --   --  3.4  HGB 12.6 13.3 15.0 11.1* 11.3*  HCT 37.0 45.0 44.0 36.6 37.5  MCV  --  74.9*  --  73.9* 74.4*  PLT  --  255  --  259 281   Basic Metabolic Panel: Recent Labs  Lab 10/20/23 1059 10/20/23 1127 10/20/23 1217 10/21/23 0445 10/22/23 0400  NA 141 139 139 139 140  K 3.1* 4.3 3.5 2.9* 3.7  CL  --  104 103 105 105  CO2  --   --  23 26 25   GLUCOSE  --  204* 173* 234* 207*  BUN  --  9 8 8 10   CREATININE  --  0.50 0.53 0.69 0.64  CALCIUM   --   --  9.1 8.8* 8.7*  MG  --   --   --  1.4*  --    GFR: Estimated Creatinine Clearance: 58.1 mL/min (by C-G formula based on SCr of 0.64 mg/dL). Liver Function Tests: Recent Labs  Lab 10/20/23 1217 10/21/23 0445 10/22/23 0400  AST 30 25 24   ALT 24 25 23   ALKPHOS 112 92 105  BILITOT 0.8  0.5 0.3  PROT 6.3* 5.6* 6.0*  ALBUMIN 2.9* 2.6* 2.6*   No results for input(s): LIPASE, AMYLASE in the last 168 hours. Recent Labs  Lab 10/20/23 1821  AMMONIA 28   Coagulation Profile: No results for input(s): INR, PROTIME in the last 168 hours. Cardiac Enzymes: Recent Labs  Lab 10/20/23 1217  CKTOTAL 179   BNP (last 3 results) No results for input(s): PROBNP in the last 8760 hours. HbA1C: No results for input(s): HGBA1C in the last 72 hours. CBG: Recent Labs  Lab  10/26/23 0746 10/26/23 1226 10/26/23 1655 10/26/23 2034 10/27/23 0818  GLUCAP 151* 175* 168* 110* 155*   Lipid Profile: No results for input(s): CHOL, HDL, LDLCALC, TRIG, CHOLHDL, LDLDIRECT in the last 72 hours. Thyroid Function Tests: No results for input(s): TSH, T4TOTAL, FREET4, T3FREE, THYROIDAB in the last 72 hours.  Anemia Panel: No results for input(s): VITAMINB12, FOLATE, FERRITIN, TIBC, IRON , RETICCTPCT in the last 72 hours.  Sepsis Labs: Recent Labs  Lab 10/20/23 1821  PROCALCITON <0.10    Recent Results (from the past 240 hours)  SARS Coronavirus 2 by RT PCR (hospital order, performed in [facility] hospital lab) *cepheid single result test* Anterior Nasal Swab     Status: Abnormal   Collection Time: 10/21/23  2:50 PM   Specimen: Anterior Nasal Swab  Result Value Ref Range Status   SARS Coronavirus 2 by RT PCR POSITIVE (A) NEGATIVE Final    Comment: Performed at [facility], [address] [place], [place] [address]  Respiratory (~20 pathogens) panel by PCR     Status: None   Collection Time: 10/21/23  2:50 PM   Specimen: Nasopharyngeal Swab; Respiratory  Result Value Ref Range Status   Adenovirus NOT DETECTED NOT DETECTED Final   Coronavirus 229E NOT DETECTED NOT DETECTED Final    Comment: (NOTE) The Coronavirus on the Respiratory Panel, DOES NOT test for the novel  Coronavirus (2019 nCoV)    Coronavirus HKU1 NOT DETECTED NOT  DETECTED Final   Coronavirus NL63 NOT DETECTED NOT DETECTED Final   Coronavirus OC43 NOT DETECTED NOT DETECTED Final   Metapneumovirus NOT DETECTED NOT DETECTED Final   Rhinovirus / Enterovirus NOT DETECTED NOT DETECTED Final   Influenza A NOT DETECTED NOT DETECTED Final   Influenza B NOT DETECTED NOT DETECTED Final   Parainfluenza Virus 1 NOT DETECTED NOT DETECTED Final   Parainfluenza Virus 2 NOT DETECTED NOT DETECTED Final   Parainfluenza Virus 3 NOT DETECTED NOT DETECTED Final   Parainfluenza Virus 4 NOT DETECTED NOT DETECTED Final   Respiratory Syncytial Virus NOT DETECTED NOT DETECTED Final   Bordetella pertussis NOT DETECTED NOT DETECTED Final   Bordetella Parapertussis NOT DETECTED NOT DETECTED Final   Chlamydophila pneumoniae NOT DETECTED NOT DETECTED Final   Mycoplasma pneumoniae NOT DETECTED NOT DETECTED Final    Comment: Performed at [facility], [address] [place], [place] [address]         Radiology Studies: No results found.      LOS: 6 days   Time spent= 38 mins    Deliliah Room, MD [facility]  If 7PM-7AM, please contact night-coverage  10/27/2023, 9:49 AM

## 2023-10-28 DIAGNOSIS — Z515 Encounter for palliative care: Secondary | ICD-10-CM | POA: Diagnosis not present

## 2023-10-28 DIAGNOSIS — R4182 Altered mental status, unspecified: Secondary | ICD-10-CM | POA: Diagnosis not present

## 2023-10-28 DIAGNOSIS — G9341 Metabolic encephalopathy: Secondary | ICD-10-CM | POA: Diagnosis not present

## 2023-10-28 DIAGNOSIS — Z7189 Other specified counseling: Secondary | ICD-10-CM | POA: Diagnosis not present

## 2023-10-28 LAB — GLUCOSE, CAPILLARY
Glucose-Capillary: 135 mg/dL — ABNORMAL HIGH (ref 70–99)
Glucose-Capillary: 114 mg/dL — ABNORMAL HIGH (ref 70–99)
Glucose-Capillary: 98 mg/dL (ref 70–99)
Glucose-Capillary: 138 mg/dL — ABNORMAL HIGH (ref 70–99)

## 2023-10-28 NOTE — Progress Notes (Signed)
 Daily Progress Note   Date: 10/28/2023   Patient Name: Allison Hughes  DOB: Aug 23, 1950  MRN: [id number]  Age / Sex: 73 y.o., female  Attending Physician: Allison Antu, MD Primary Care Physician: Allison Carwin, MD Admit Date: 10/20/2023 Length of Stay: 7 days  Reason for Follow-up: Establishing goals of care  Past Medical History:  Diagnosis Date   Acute pyelonephritis 02/02/2016   Diabetes mellitus 01/15/2004   T2DM   Hyperlipidemia    Hypertension    Obesity (BMI 30-39.9)    Pneumonia 09/06/2020   Sepsis (HCC) 02/02/2016   Stroke (HCC)     Subjective:   Subjective: Chart Reviewed. Updates received. Patient Assessed. Created space and opportunity for patient  and family to explore thoughts and feelings regarding current medical situation.  Today's Discussion: Today before meeting with the patient/family, I reviewed the chart notes including hospitalist note from yesterday, mobility note from yesterday, PT note from yesterday, nursing notes from yesterday, nursing note from today, TOC note from today, OT note from today. I also reviewed vital signs, nursing flowsheets, medication administrations record, labs, and imaging.  Today saw the patient at bedside, OT was at the bedside as well working with the patient on edge of bed balance.  Today she knows her name, is not able to tell me where she is at (she tells me this is a silly place).  When asked the year she tells me the 25th or 26th, not sure if she meant 2025 or 2026.  I reoriented her to time and place.  I asked her why she is in the hospital and she says because I am sick but is unable to tell me any details about her condition.  Additionally, she is staring out the window and keeps mentioning silly putty.  I shared that I would come check on her in a couple days and I plan to call her friend Allison Hughes and she says okay, thank you.  Later today I called her friend Allison Hughes to update him on the patient.  Provided clinical  update and confirmed plans for discharge to SNF/rehab.  He thinks that rehab is really what she needs noticed.  I shared the continued plan for big picture conversations and the patient is able to participate fully.  It seems like Allison Hughes may be available for emergency situations but he does not want to make life altering decisions that are urgent.  He would prefer to have her input on these as well.  I recommended outpatient palliative care at discharge to help with ongoing conversations and he is in agreement.  I shared about I will check on the patient in a couple days, hopeful for continued improvement and hopeful for more mental clarity in the coming days. I provided emotional and general support through therapeutic listening, empathy, sharing of stories, and other techniques. I answered all questions and addressed all concerns to the best of my ability.  Review of Systems  Constitutional:        Denies pain in general  Respiratory:  Negative for shortness of breath.   Gastrointestinal:  Negative for abdominal pain, nausea and vomiting.    Objective:   Primary Diagnoses: Present on Admission:  Acute metabolic encephalopathy  Urinary tract infection  Acute respiratory failure with hypoxia (HCC)  Chronic diastolic CHF (congestive heart failure) (HCC)  Essential hypertension  Hyperlipidemia   Vital Signs:  BP (!) 168/77 (BP Location: Right Arm)   Pulse 71   Temp 97.9 F (36.6 C)  Resp 20   Ht 5' 2 (1.575 m)   Wt 70.8 kg   SpO2 96%   BMI 28.55 kg/m   Physical Exam Vitals and nursing note reviewed.  Constitutional:      General: She is not in acute distress.    Appearance: She is ill-appearing.  HENT:     Head: Normocephalic and atraumatic.  Cardiovascular:     Rate and Rhythm: Normal rate.  Pulmonary:     Effort: Pulmonary effort is normal. No respiratory distress.  Abdominal:     General: Abdomen is flat.  Skin:    General: Skin is warm and dry.  Neurological:      Mental Status: She is disoriented and confused.  Psychiatric:        Mood and Affect: Mood normal.        Behavior: Behavior normal.     Palliative Assessment/Data: 50%   Existing Vynca/ACP Documentation: None  Assessment & Plan:   HPI/Patient Profile:  73 y.o. female  with past medical history of hypertension, hyperlipidemia, CVA, COPD, diabetes mellitus type 2, GI bleed, neuroendocrine ulcer, and obesity who presents after being found on the floor confused she is being treated for acute metabolic encephalopathy in the setting of urinary tract infection, acute hypoxic respiratory failure secondary to COVID-19 infection.   SUMMARY OF RECOMMENDATIONS   Full code Full scope of care Ongoing time for clearing of mental faculties Completion of HCPOA documentation if patient regains capacity during admission Recommended outpatient palliative care at discharge, will put in [facility] consult Anticipate discharge to SNF/rehab Palliative medicine will follow-up in a couple days Please notify us  of any significant clinical change or new palliative needs in the interim  Symptom Management:  Per primary team Palliative medicine is available to assist as needed  Code Status: Full Code  Prognosis: Unable to determine  Discharge Planning: Skilled Nursing Facility for rehab with Palliative care service follow-up  Discussed with: Patient, patient's friend, medical team, nursing team, [facility] team  Thank you for allowing us  to participate in the care of Allison Hughes PMT will continue to support holistically.  Billing based on MDM: Moderate  Detailed review of medical records (labs, imaging, vital signs), medically appropriate exam, discussed with treatment team, counseling and education to patient, family, & staff, documenting clinical information, medication management, coordination of care  Allison Kays, NP Palliative Medicine Team  Team Phone # [phone] (Nights/Weekends)  09/12/2020,  8:17 AM

## 2023-10-28 NOTE — TOC Progression Note (Signed)
 Transition of Care [facility]) - Progression Note    Patient Details  Name: Allison Hughes MRN: [id number] Date of Birth: [birth year]  Transition of Care [facility]) CM/SW Contact  [name] Clack, [place] Phone Number: 10/28/2023, 8:38 AM  Clinical Narrative:     Patient and friend/Phillip indicated [facility] as their choice. [facility] accepted in hub and insurance authorization pending, auth ID: [id number].   Expected Discharge Plan: Skilled Nursing Facility Barriers to Discharge: Continued Medical Work up, SNF Pending bed offer               Expected Discharge Plan and Services                                               Social Drivers of Health (SDOH) Interventions SDOH Screenings   Food Insecurity: Patient Unable To Answer (10/20/2023)  Housing: Unknown (10/20/2023)  Transportation Needs: Patient Unable To Answer (10/20/2023)  Utilities: Patient Unable To Answer (10/20/2023)  Depression (PHQ2-9): Low Risk  (10/26/2020)  Social Connections: Patient Unable To Answer (10/20/2023)  Tobacco Use: High Risk (10/20/2023)    Readmission Risk Interventions    10/24/2023    2:14 PM  Readmission Risk Prevention Plan  Transportation Screening Complete  PCP or Specialist Appt within 3-5 Days Complete  HRI or Home Care Consult Complete  Social Work Consult for Recovery Care Planning/Counseling Complete  Palliative Care Screening Complete  Medication Review [employer]) Referral to Pharmacy

## 2023-10-28 NOTE — Plan of Care (Signed)

## 2023-10-28 NOTE — Progress Notes (Signed)
 PROGRESS NOTE    Allison Hughes  FMW:[id number] DOB: 02-25-1950 DOA: 10/20/2023 PCP: Valma Carwin, MD   Brief Narrative:    73 y.o. female with medical history significant of hypertension, hyperlipidemia, CVA, COPD, diabetes mellitus type 2, GI bleed, neuroendocrine ulcer, and obesity who presents after being found on the floor confused she is being treated for acute metabolic encephalopathy in the setting of urinary tract infection, acute hypoxic respiratory failure secondary to COVID-19 infection and electrolytes are being repleted. Pending SNF placement.  Assessment & Plan:  Principal Problem:   Acute metabolic encephalopathy Active Problems:   Urinary tract infection   Acute respiratory failure with hypoxia (HCC)   Chronic diastolic CHF (congestive heart failure) (HCC)   Fall at home, initial encounter   Essential hypertension   DM (diabetes mellitus), type 2 (HCC)   Hyperlipidemia   History of stroke   History of gastric ulcer   Acute metabolic encephalopathy:  In the setting of baseline cognitive impairment. CT head is negative for acute intracranial abnormality.  MRI brain showing No acute intracranial abnormality. Old left cerebellar infarct. Early confluent white matter T2 hyperintensity consistent with chronic small vessel disease.Generalized cerebral volume loss. EEG did not show any seizures.  Suspect from UTI Finished course of IV ceftriaxone .        Acute hypoxic respiratory failure secondary to COVID Currently, requiring about 3 L of [place] oxygen.  Positive for COVID-19 infection -Dced corticosteroids on 10/9 -Initial bronchodilator therapy as needed     Left foot and ankle pain and some swelling on the dorsum of the foot X rays of the left foot showed evidence of soft tissue swelling without any acute fractures.   Hypokalemia and hypomagnesemia As needed repletion  Hospital acquired delirium: Frequent reorientation Dced steroids Started on seroquel  25  mg at bedtime on 10/9.    H/o CVA  Continue with PT and OT     GERD Continue with PPI.    Body mass index is 33.1 kg/m. Obesity.      Hyperlipidemia Resumed statin      Controlled type 2 DM  Resume SSI.  A1c is 6.9% Diabetes coordinator on board, appreciate assistance   Disposition: Patient was living at home by herself.  PT/OT consulted.  She will need skilled nursing facility upon discharge. Awaiting placement at [facility], pending insurance auth.  Palliative care on board.  DVT prophylaxis: enoxaparin  (LOVENOX ) injection 40 mg Start: 10/20/23 1800     Code Status: Full Code Family Communication:  None at the bedside Status is: Inpatient Remains inpatient appropriate because: encephalopathy, COVID, needs placement    Subjective:  No acute events overnight. Still appears confused and disoriented but not agitated.  Examination:  General exam: Appears confused and disoriented  Respiratory system: clear breath sounds bilaterally Cardiovascular system: S1 & S2 heard, RRR. No JVD, murmurs, rubs, gallops or clicks. No pedal edema. Gastrointestinal system: Abdomen is nondistended, soft and nontender. No organomegaly or masses felt. Normal bowel sounds heard. Central nervous system: Confused and disoriented. No focal neurological deficits. Extremities: Moving all extremities equally Skin: No rashes, lesions or ulcers Psychiatry: confused and disoriented     Diet Orders (From admission, onward)     Start     Ordered   10/20/23 1617  Diet Carb Modified Fluid consistency: Thin; Room service appropriate? Yes  Diet effective now       Question Answer Comment  Diet-HS Snack? Nothing   Calorie Level Medium 1600-2000   Fluid consistency: Thin  Room service appropriate? Yes      10/20/23 1616            Objective: Vitals:   10/28/23 0701 10/28/23 0757 10/28/23 0759 10/28/23 1111  BP: (!) 154/66 (!) 168/77  (!) 143/89  Pulse: 75 71  85  Resp: 20      Temp: 98 F (36.7 C) 97.9 F (36.6 C)    TempSrc:      SpO2: 93% 97% 96% 97%  Weight:      Height:        Intake/Output Summary (Last 24 hours) at 10/28/2023 1129 Last data filed at 10/28/2023 0700 Gross per 24 hour  Intake --  Output 1100 ml  Net -1100 ml    Filed Weights   10/25/23 0710 10/26/23 0500 10/28/23 0500  Weight: 71 kg 69.6 kg 70.8 kg    Scheduled Meds:  atorvastatin   40 mg Oral Daily   budesonide -glycopyrrolate -formoterol   2 puff Inhalation BID   vitamin B-12  500 mcg Oral Daily   enoxaparin  (LOVENOX ) injection  40 mg Subcutaneous Q24H   haloperidol  lactate  1 mg Intravenous Once   insulin  aspart  0-9 Units Subcutaneous TID WC   insulin  glargine  15 Units Subcutaneous QHS   lisinopril   20 mg Oral QHS   Ensure Max Protein  11 oz Oral BID   QUEtiapine   25 mg Oral QHS   sodium chloride  flush  3 mL Intravenous Q12H   Continuous Infusions:  sodium chloride  75 mL/hr at 10/28/23 0252    Nutritional status Signs/Symptoms:  (A1c 6.9, CBGs > 180)   Body mass index is 28.55 kg/m.  Data Reviewed:   CBC: Recent Labs  Lab 10/22/23 0400  WBC 5.5  NEUTROABS 3.4  HGB 11.3*  HCT 37.5  MCV 74.4*  PLT 281   Basic Metabolic Panel: Recent Labs  Lab 10/22/23 0400  NA 140  K 3.7  CL 105  CO2 25  GLUCOSE 207*  BUN 10  CREATININE 0.64  CALCIUM  8.7*   GFR: Estimated Creatinine Clearance: 58.6 mL/min (by C-G formula based on SCr of 0.64 mg/dL). Liver Function Tests: Recent Labs  Lab 10/22/23 0400  AST 24  ALT 23  ALKPHOS 105  BILITOT 0.3  PROT 6.0*  ALBUMIN 2.6*   No results for input(s): LIPASE, AMYLASE in the last 168 hours. No results for input(s): AMMONIA in the last 168 hours.  Coagulation Profile: No results for input(s): INR, PROTIME in the last 168 hours. Cardiac Enzymes: No results for input(s): CKTOTAL, CKMB, CKMBINDEX, TROPONINI in the last 168 hours.  BNP (last 3 results) No results for input(s): PROBNP in  the last 8760 hours. HbA1C: No results for input(s): HGBA1C in the last 72 hours. CBG: Recent Labs  Lab 10/27/23 1132 10/27/23 1603 10/27/23 2132 10/28/23 0756 10/28/23 1110  GLUCAP 232* 153* 155* 135* 114*   Lipid Profile: No results for input(s): CHOL, HDL, LDLCALC, TRIG, CHOLHDL, LDLDIRECT in the last 72 hours. Thyroid Function Tests: No results for input(s): TSH, T4TOTAL, FREET4, T3FREE, THYROIDAB in the last 72 hours.  Anemia Panel: No results for input(s): VITAMINB12, FOLATE, FERRITIN, TIBC, IRON , RETICCTPCT in the last 72 hours.  Sepsis Labs: No results for input(s): PROCALCITON, LATICACIDVEN in the last 168 hours.   Recent Results (from the past 240 hours)  SARS Coronavirus 2 by RT PCR (hospital order, performed in [facility] hospital lab) *cepheid single result test* Anterior Nasal Swab     Status: Abnormal   Collection Time: 10/21/23  2:50 PM   Specimen: Anterior Nasal Swab  Result Value Ref Range Status   SARS Coronavirus 2 by RT PCR POSITIVE (A) NEGATIVE Final    Comment: Performed at [facility], [address], [place], [place] [address]  Respiratory (~20 pathogens) panel by PCR     Status: None   Collection Time: 10/21/23  2:50 PM   Specimen: Nasopharyngeal Swab; Respiratory  Result Value Ref Range Status   Adenovirus NOT DETECTED NOT DETECTED Final   Coronavirus 229E NOT DETECTED NOT DETECTED Final    Comment: (NOTE) The Coronavirus on the Respiratory Panel, DOES NOT test for the novel  Coronavirus (2019 nCoV)    Coronavirus HKU1 NOT DETECTED NOT DETECTED Final   Coronavirus NL63 NOT DETECTED NOT DETECTED Final   Coronavirus OC43 NOT DETECTED NOT DETECTED Final   Metapneumovirus NOT DETECTED NOT DETECTED Final   Rhinovirus / Enterovirus NOT DETECTED NOT DETECTED Final   Influenza A NOT DETECTED NOT DETECTED Final   Influenza B NOT DETECTED NOT DETECTED Final   Parainfluenza Virus 1 NOT DETECTED NOT  DETECTED Final   Parainfluenza Virus 2 NOT DETECTED NOT DETECTED Final   Parainfluenza Virus 3 NOT DETECTED NOT DETECTED Final   Parainfluenza Virus 4 NOT DETECTED NOT DETECTED Final   Respiratory Syncytial Virus NOT DETECTED NOT DETECTED Final   Bordetella pertussis NOT DETECTED NOT DETECTED Final   Bordetella Parapertussis NOT DETECTED NOT DETECTED Final   Chlamydophila pneumoniae NOT DETECTED NOT DETECTED Final   Mycoplasma pneumoniae NOT DETECTED NOT DETECTED Final    Comment: Performed at [facility], [address], [place], [place] [address]         Radiology Studies: No results found.      LOS: 7 days   Time spent= 38 mins    Deliliah Room, MD [facility]  If 7PM-7AM, please contact night-coverage  10/28/2023, 11:29 AM

## 2023-10-28 NOTE — Progress Notes (Signed)
 Occupational Therapy Treatment Patient Details Name: Allison Hughes MRN: [id number] DOB: [birth year] Today's Date: 10/28/2023   History of present illness 73 yo female found down on floor 10/6. Recent hospitalization 9/5-9/10 found on floor hypoxic with PNA and UTI d/c to [facility].  Currently pt with confusion, paranoid thoughts and forgetfulness. Found to have COVID and acute metabolic encephalopathy. PMH HTN HLD CVA COPD DM2 GIB, Obesity   OT comments  Pt presented in bed and  gaze to R side with R side cervical rotation. Pt only able to answer their name and follow simple one step commands inconsistently. She required max x2 for bed mobility, mod x2 for sit to stand with max cues and step pivot to chair with x2 attempts as needed max assist to guide walker and provided max tactile cues to coordinate BLE. She then attempted to engage in some self feeding. Patient will benefit from continued inpatient follow up therapy, <3 hours/day.      If plan is discharge home, recommend the following:  A lot of help with walking and/or transfers;A lot of help with bathing/dressing/bathroom;Assistance with cooking/housework;Assistance with feeding;Direct supervision/assist for medications management;Direct supervision/assist for financial management;Assist for transportation;Help with stairs or ramp for entrance;Supervision due to cognitive status   Equipment Recommendations       Recommendations for Other Services      Precautions / Restrictions Precautions Precautions: Fall Recall of Precautions/Restrictions: Impaired Precaution/Restrictions Comments: AMS, COVID precautions, watch SpO2 Restrictions Weight Bearing Restrictions Per Provider Order: No       Mobility Bed Mobility Overal bed mobility: Needs Assistance Bed Mobility: Supine to Sit     Supine to sit: Max assist, +2 for physical assistance, +2 for safety/equipment, HOB elevated, Used rails     General bed mobility  comments: pt needed encouragement to come to EOB and used pads with max assist to sit at EOB    Transfers Overall transfer level: Needs assistance Equipment used: Rolling walker (2 wheels) Transfers: Sit to/from Stand Sit to Stand: Mod assist, +2 physical assistance, +2 safety/equipment, Max assist                 Balance Overall balance assessment: Needs assistance Sitting-balance support: Feet supported Sitting balance-Leahy Scale: Fair Sitting balance - Comments: able to sit unsupport but then started to lateral lean to R side Postural control: Right lateral lean Standing balance support: Bilateral upper extremity supported Standing balance-Leahy Scale: Poor Standing balance comment: relaint on support                           ADL either performed or assessed with clinical judgement   ADL Overall ADL's : Needs assistance/impaired Eating/Feeding: Set up;Minimal assistance;Sitting Eating/Feeding Details (indicate cue type and reason): min assist to reach out to coordinate to mouth Grooming: Wash/dry hands;Wash/dry face;Minimal assistance;Sitting   Upper Body Bathing: Maximal assistance;Sitting   Lower Body Bathing: Total assistance;Sit to/from stand;+2 for physical assistance;+2 for safety/equipment   Upper Body Dressing : Maximal assistance;Sitting   Lower Body Dressing: Total assistance;Sit to/from stand;+2 for physical assistance;+2 for safety/equipment   Toilet Transfer: Maximal assistance;+2 for physical assistance;+2 for safety/equipment;Rolling walker (2 wheels)   Toileting- Clothing Manipulation and Hygiene: Total assistance;+2 for physical assistance;+2 for safety/equipment;Sit to/from stand       Functional mobility during ADLs: Maximal assistance;+2 for physical assistance;+2 for safety/equipment;Moderate assistance;Rolling walker (2 wheels);Cueing for sequencing;Cueing for safety      Extremity/Trunk Assessment Upper Extremity  Assessment  Upper Extremity Assessment: Difficult to assess due to impaired cognition (Pt noted to keep BUE mostly in a flexed position with more use of RUE compared to LUE as reaching out to therapist with and BUE edema)   Lower Extremity Assessment Lower Extremity Assessment: Defer to PT evaluation        Vision   Vision Assessment?: Vision impaired- to be further tested in functional context Additional Comments: pt pften gazed to R side was able to track to L side with max stimulation and kept head rotated to R side   Perception     Praxis     Communication Communication Communication: Impaired Factors Affecting Communication: Hearing impaired   Cognition Arousal: Alert Behavior During Therapy: Flat affect Cognition: Cognition impaired   Orientation impairments: Place, Time, Situation Awareness: Intellectual awareness impaired, Online awareness impaired Memory impairment (select all impairments): Short-term memory, Working [employer] fast streamer, Non-declarative long-term memory, Geneticist, molecular long-term memory Attention impairment (select first level of impairment): Focused attention Executive functioning impairment (select all impairments): Initiation OT - Cognition Comments: pt only able to report name with tihs therapist                 Following commands: Impaired Following commands impaired: Follows one step commands inconsistently, Follows one step commands with increased time      Cueing   Cueing Techniques: Verbal cues, Gestural cues, Tactile cues, Visual cues  Exercises      Shoulder Instructions       General Comments      Pertinent Vitals/ Pain       Pain Assessment Pain Assessment: No/denies pain  Home Living                                          Prior Functioning/Environment              Frequency  Min 2X/week        Progress Toward Goals  OT Goals(current goals can now be found in the care plan section)  Progress towards  OT goals: Progressing toward goals  Acute Rehab OT Goals Patient Stated Goal: none OT Goal Formulation: With patient Time For Goal Achievement: 11/04/23 Potential to Achieve Goals: Fair ADL Goals Pt Will Perform Grooming: with mod assist;sitting Pt Will Perform Upper Body Bathing: with mod assist;sitting Pt Will Transfer to Toilet: with mod assist;bedside commode;stand pivot transfer Additional ADL Goal #1: pt will complete bed mobility mod (A) Additional ADL Goal #2: pt will follow 2 step command 50% of attempts  Plan      Co-evaluation                 AM-PAC OT 6 Clicks Daily Activity     Outcome Measure   Help from another person eating meals?: A Little Help from another person taking care of personal grooming?: A Little Help from another person toileting, which includes using toliet, bedpan, or urinal?: Total Help from another person bathing (including washing, rinsing, drying)?: A Lot Help from another person to put on and taking off regular upper body clothing?: A Lot Help from another person to put on and taking off regular lower body clothing?: Total 6 Click Score: 12    End of Session Equipment Utilized During Treatment: Gait belt;Rolling walker (2 wheels)  OT Visit Diagnosis: Unsteadiness on feet (R26.81);Muscle weakness (generalized) (M62.81)   Activity Tolerance Patient limited by lethargy  Patient Left in chair;with call bell/phone within reach;with chair alarm set   Nurse Communication Mobility status (spoke to nursing they may only last short period of time in chair with lateral lean)        Time: 9052-8978 OT Time Calculation (min): 34 min  Charges: OT General Charges $OT Visit: 1 Visit OT Treatments $Self Care/Home Management : 23-37 mins  Warrick POUR OTR/L  Acute Rehab Services  [phone] office number   Warrick Berber 10/28/2023, 11:15 AM

## 2023-10-29 DIAGNOSIS — G9341 Metabolic encephalopathy: Secondary | ICD-10-CM | POA: Diagnosis not present

## 2023-10-29 LAB — BASIC METABOLIC PANEL WITH GFR
Anion gap: 16 — ABNORMAL HIGH (ref 5–15)
BUN: 9 mg/dL (ref 8–23)
CO2: 18 mmol/L — ABNORMAL LOW (ref 22–32)
Calcium: 8.6 mg/dL — ABNORMAL LOW (ref 8.9–10.3)
Chloride: 105 mmol/L (ref 98–111)
Creatinine, Ser: 0.55 mg/dL (ref 0.44–1.00)
GFR, Estimated: >60 mL/min (ref >60)
Glucose, Bld: 254 mg/dL — ABNORMAL HIGH (ref 70–99)
Potassium: 3.8 mmol/L (ref 3.5–5.1)
Sodium: 139 mmol/L (ref 135–145)

## 2023-10-29 LAB — GLUCOSE, CAPILLARY
Glucose-Capillary: 116 mg/dL — ABNORMAL HIGH (ref 70–99)
Glucose-Capillary: 254 mg/dL — ABNORMAL HIGH (ref 70–99)
Glucose-Capillary: 88 mg/dL (ref 70–99)
Glucose-Capillary: 130 mg/dL — ABNORMAL HIGH (ref 70–99)

## 2023-10-29 MED ORDER — HALOPERIDOL LACTATE 5 MG/ML IJ SOLN
1.0000 mg | Freq: Once | INTRAMUSCULAR | Status: AC
  Administered 2023-10-29: 1 mg via INTRAVENOUS
  Filled 2023-10-29: qty 1

## 2023-10-29 MED ORDER — INSULIN GLARGINE-YFGN 100 UNIT/ML ~~LOC~~ SOLN
15.0000 [IU] | Freq: Every day | SUBCUTANEOUS | Status: DC
  Administered 2023-10-29 – 2023-11-10 (×12): 15 [IU] via SUBCUTANEOUS
  Filled 2023-10-29 (×14): qty 0.15

## 2023-10-29 MED ORDER — LISINOPRIL 20 MG PO TABS
30.0000 mg | ORAL_TABLET | Freq: Every day | ORAL | Status: DC
  Administered 2023-10-29 – 2023-11-10 (×11): 30 mg via ORAL
  Filled 2023-10-29 (×13): qty 1

## 2023-10-29 NOTE — Progress Notes (Signed)
 Nutrition Follow-up  DOCUMENTATION CODES:   Obesity unspecified  INTERVENTION:  Continue carb modified diet Document % PO in flowsheet Ensure Max po BID, each supplement provides 150 kcal and 30 grams of protein.  Monitor CBG, adjust insulin  regimen as needed    NUTRITION DIAGNOSIS:   Altered nutrition lab value related to chronic illness ((diabetes)) as evidenced by  (A1c 6.9, CBGs > 180). - ongoing    GOAL:   Patient will meet greater than or equal to 90% of their needs - progressing    MONITOR:   Labs, PO intake  REASON FOR ASSESSMENT:   Consult Assessment of nutrition requirement/status  ASSESSMENT:   Past medical history significant of hypertension, hyperlipidemia, CVA, COPD, diabetes mellitus type 2, GI bleed, neuroendocrine ulcer, and obesity who presents after being found on the floor confused.  Patient seen in room, sitting in bed. Pt remains oriented to self only. Lunch tray at bedside uneaten. RD asked patient if she was hungry and she responded OK. RD brought lunch tray to patient and set up tray for her to eat. ~90% PO intake x 2 meals documented in flowsheet. Pending insurance auth for SNF for discharge.    Labs: Glu 254, Calcium  8.6, CBG 98-254   Meds: B12,  SSI 0-9 units TID, lantus  15 units daily, NS @ 75 ml/hr   NUTRITION - FOCUSED PHYSICAL EXAM:  Flowsheet Row Most Recent Value  Orbital Region No depletion  Upper Arm Region Mild depletion  Thoracic and Lumbar Region No depletion  Buccal Region No depletion  Temple Region Unable to assess  [EEG nodes]  Clavicle Bone Region No depletion  Clavicle and Acromion Bone Region No depletion  Scapular Bone Region No depletion  Dorsal Hand No depletion  Patellar Region No depletion  Anterior Thigh Region No depletion  Posterior Calf Region No depletion  Hair Unable to assess  [EEG nodes]  Eyes Reviewed  Mouth Reviewed  Skin Reviewed  Nails Reviewed    Diet Order:   Diet Order              Diet Carb Modified Fluid consistency: Thin; Room service appropriate? Yes  Diet effective now                   EDUCATION NEEDS:   Not appropriate for education at this time  Skin:  Skin Assessment: Reviewed RN Assessment  Last BM:  10/14 type 1  Height:   Ht Readings from Last 1 Encounters:  10/20/23 5' 2 (1.575 m)    Weight:   Wt Readings from Last 1 Encounters:  10/28/23 70.8 kg    Ideal Body Weight:  50 kg  BMI:  Body mass index is 28.55 kg/m.  Estimated Nutritional Needs:   Kcal:  1250-1500 kcals  Protein:  50-60 gm  Fluid:  1.2-1.5 L/d  Lenzi Marmo, MS, RD, LDN Clinical Dietitian  Please see AMiON for contact information.

## 2023-10-29 NOTE — Progress Notes (Addendum)
 PROGRESS NOTE    Allison Hughes  FMW:[id number] DOB: 30-Nov-1950 DOA: 10/20/2023 PCP: Valma Carwin, MD   Brief Narrative:    73 y.o. female with medical history significant of hypertension, hyperlipidemia, CVA, COPD, diabetes mellitus type 2, GI bleed, neuroendocrine ulcer, and obesity who presents after being found on the floor confused she is being treated for acute metabolic encephalopathy in the setting of urinary tract infection, acute hypoxic respiratory failure secondary to COVID-19 infection and electrolytes are being repleted. Pending SNF placement.  Assessment & Plan:  Principal Problem:   Acute metabolic encephalopathy Active Problems:   Urinary tract infection   Acute respiratory failure with hypoxia (HCC)   Chronic diastolic CHF (congestive heart failure) (HCC)   Fall at home, initial encounter   Essential hypertension   DM (diabetes mellitus), type 2 (HCC)   Hyperlipidemia   History of stroke   History of gastric ulcer   Acute metabolic encephalopathy: resolved In the setting of baseline cognitive impairment.  Mentation now around patient's baseline per s/o -- waxes and wanes. CT head is negative for acute intracranial abnormality.  MRI brain showing No acute intracranial abnormality. Old left cerebellar infarct. Early confluent white matter T2 hyperintensity consistent with chronic small vessel disease.Generalized cerebral volume loss. EEG did not show any seizures.  --Suspect from UTI --Finished course of IV ceftriaxone .        Acute hypoxic respiratory failure secondary to COVID Currently, requiring about 3 L of [place] oxygen.  Positive for COVID-19 infection -Dced corticosteroids on 10/9 -Initial bronchodilator therapy as needed     Left foot and ankle pain and some swelling on the dorsum of the foot X rays of the left foot showed evidence of soft tissue swelling without any acute fractures.   Hypokalemia and hypomagnesemia As needed repletion  Hospital  acquired delirium: Frequent reorientation Dced steroids Started on seroquel  25 mg at bedtime on 10/9.    H/o CVA  Continue with PT and OT     GERD Continue with PPI.    Body mass index is 33.1 kg/m. Obesity.      Hyperlipidemia Resumed statin      Controlled type 2 DM  Resume SSI.  A1c is 6.9% Diabetes coordinator on board, appreciate assistance   Disposition: Patient was living at home by herself.  PT/OT consulted.  She will need skilled nursing facility upon discharge. Awaiting placement at [facility], pending insurance auth and 10 day Covid quarantine before d/c.  Palliative care on board.  DVT prophylaxis: enoxaparin  (LOVENOX ) injection 40 mg Start: 10/20/23 1800     Code Status: Full Code  Family Communication:  S/O Phillip at bedside on rounds  Status is: Inpatient Remains inpatient appropriate because: Awaiting SNF placement after required 10 day quarantine due to Covid+ that facility requires.    Subjective:  Pt seen awake resting in bed today, s/o at bedside.  Pt denies any complaints.  No acute events reported overnight.  Examination:  General exam: awake, alert, no acute distress, confused HEENT: moist mucus membranes, hard of hearing  Respiratory system: CTAB, no wheezes, rales or rhonchi, normal respiratory effort. Cardiovascular system: normal S1/S2, RRR, no JVD, murmurs, rubs, gallops, no pedal edema.   Gastrointestinal system: soft, NT, ND, no HSM felt, +bowel sounds. Central nervous system: A&O x self and hospital. no gross focal neurologic deficits, normal speech Extremities: moves all, no edema, normal tone Skin: dry, intact, normal temperature       Diet Orders (From admission, onward)  Start     Ordered   10/20/23 1617  Diet Carb Modified Fluid consistency: Thin; Room service appropriate? Yes  Diet effective now       Question Answer Comment  Diet-HS Snack? Nothing   Calorie Level Medium 1600-2000   Fluid consistency: Thin    Room service appropriate? Yes      10/20/23 1616            Objective: Vitals:   10/29/23 0755 10/29/23 1122 10/29/23 1442 10/29/23 1527  BP: (!) 152/68 (!) 143/74 (!) 186/78 (!) 158/83  Pulse: 75 89 97   Resp: 16 16 18    Temp: 98.1 F (36.7 C) 97.8 F (36.6 C) 98.3 F (36.8 C) 99 F (37.2 C)  TempSrc:   Oral Oral  SpO2: 96% 96% 97% 98%  Weight:      Height:        Intake/Output Summary (Last 24 hours) at 10/29/2023 1613 Last data filed at 10/29/2023 0918 Gross per 24 hour  Intake --  Output 900 ml  Net -900 ml    Filed Weights   10/25/23 0710 10/26/23 0500 10/28/23 0500  Weight: 71 kg 69.6 kg 70.8 kg    Scheduled Meds:  atorvastatin   40 mg Oral Daily   budesonide -glycopyrrolate -formoterol   2 puff Inhalation BID   vitamin B-12  500 mcg Oral Daily   enoxaparin  (LOVENOX ) injection  40 mg Subcutaneous Q24H   insulin  aspart  0-9 Units Subcutaneous TID WC   insulin  glargine-yfgn  15 Units Subcutaneous QHS   lisinopril   30 mg Oral QHS   Ensure Max Protein  11 oz Oral BID   QUEtiapine   25 mg Oral QHS   sodium chloride  flush  3 mL Intravenous Q12H   Continuous Infusions:  sodium chloride  75 mL/hr at 10/29/23 0933    Nutritional status Signs/Symptoms:  (A1c 6.9, CBGs > 180)   Body mass index is 28.55 kg/m.  Data Reviewed:   CBC: No results for input(s): WBC, NEUTROABS, HGB, HCT, MCV, PLT in the last 168 hours.  Basic Metabolic Panel: Recent Labs  Lab 10/29/23 1105  NA 139  K 3.8  CL 105  CO2 18*  GLUCOSE 254*  BUN 9  CREATININE 0.55  CALCIUM  8.6*   GFR: Estimated Creatinine Clearance: 58.6 mL/min (by C-G formula based on SCr of 0.55 mg/dL). Liver Function Tests: No results for input(s): AST, ALT, ALKPHOS, BILITOT, PROT, ALBUMIN in the last 168 hours.  No results for input(s): LIPASE, AMYLASE in the last 168 hours. No results for input(s): AMMONIA in the last 168 hours.  Coagulation Profile: No results for  input(s): INR, PROTIME in the last 168 hours. Cardiac Enzymes: No results for input(s): CKTOTAL, CKMB, CKMBINDEX, TROPONINI in the last 168 hours.  BNP (last 3 results) No results for input(s): PROBNP in the last 8760 hours. HbA1C: No results for input(s): HGBA1C in the last 72 hours. CBG: Recent Labs  Lab 10/28/23 1644 10/28/23 2046 10/29/23 0757 10/29/23 1124 10/29/23 1611  GLUCAP 98 138* 116* 254* 88   Lipid Profile: No results for input(s): CHOL, HDL, LDLCALC, TRIG, CHOLHDL, LDLDIRECT in the last 72 hours. Thyroid Function Tests: No results for input(s): TSH, T4TOTAL, FREET4, T3FREE, THYROIDAB in the last 72 hours.  Anemia Panel: No results for input(s): VITAMINB12, FOLATE, FERRITIN, TIBC, IRON , RETICCTPCT in the last 72 hours.  Sepsis Labs: No results for input(s): PROCALCITON, LATICACIDVEN in the last 168 hours.   Recent Results (from the past 240 hours)  SARS Coronavirus 2  by RT PCR (hospital order, performed in [facility] hospital lab) *cepheid single result test* Anterior Nasal Swab     Status: Abnormal   Collection Time: 10/21/23  2:50 PM   Specimen: Anterior Nasal Swab  Result Value Ref Range Status   SARS Coronavirus 2 by RT PCR POSITIVE (A) NEGATIVE Final    Comment: Performed at [facility], [address], [place], [place] [address]  Respiratory (~20 pathogens) panel by PCR     Status: None   Collection Time: 10/21/23  2:50 PM   Specimen: Nasopharyngeal Swab; Respiratory  Result Value Ref Range Status   Adenovirus NOT DETECTED NOT DETECTED Final   Coronavirus 229E NOT DETECTED NOT DETECTED Final    Comment: (NOTE) The Coronavirus on the Respiratory Panel, DOES NOT test for the novel  Coronavirus (2019 nCoV)    Coronavirus HKU1 NOT DETECTED NOT DETECTED Final   Coronavirus NL63 NOT DETECTED NOT DETECTED Final   Coronavirus OC43 NOT DETECTED NOT DETECTED Final   Metapneumovirus NOT DETECTED  NOT DETECTED Final   Rhinovirus / Enterovirus NOT DETECTED NOT DETECTED Final   Influenza A NOT DETECTED NOT DETECTED Final   Influenza B NOT DETECTED NOT DETECTED Final   Parainfluenza Virus 1 NOT DETECTED NOT DETECTED Final   Parainfluenza Virus 2 NOT DETECTED NOT DETECTED Final   Parainfluenza Virus 3 NOT DETECTED NOT DETECTED Final   Parainfluenza Virus 4 NOT DETECTED NOT DETECTED Final   Respiratory Syncytial Virus NOT DETECTED NOT DETECTED Final   Bordetella pertussis NOT DETECTED NOT DETECTED Final   Bordetella Parapertussis NOT DETECTED NOT DETECTED Final   Chlamydophila pneumoniae NOT DETECTED NOT DETECTED Final   Mycoplasma pneumoniae NOT DETECTED NOT DETECTED Final    Comment: Performed at [facility], [address], [place], [place] [address]         Radiology Studies: No results found.      LOS: 8 days   Time spent  42 mins    Burnard DELENA Cunning, DO [facility]  If 7PM-7AM, please contact night-coverage  10/29/2023, 4:13 PM

## 2023-10-29 NOTE — Plan of Care (Signed)

## 2023-10-29 NOTE — TOC Progression Note (Addendum)
 Transition of Care [facility]) - Progression Note    Patient Details  Name: Allison Hughes MRN: [id number] Date of Birth: [birth year]  Transition of Care [facility] CM/SW Contact  [name] Clack, [place] Phone Number: 10/29/2023, 8:26 AM  Clinical Narrative:     Update 8:40 AM: [name] is valid until 10/17, 11:59 pm. CSW will have to submit a new auth for patient tomorrow (10/16) or 10/17.   Patient's insurance authorization for [facility] was approved. Auth ID: [id number], 10/14-10/16. [facility] is requesting patient complete 10 day quarantine, which will be up 10/8. CSW will reach out to [facility] to see how long their grace period for transfer to SNF is. CSW will continue to follow.   Expected Discharge Plan: Skilled Nursing Facility Barriers to Discharge: Continued Medical Work up, SNF Pending bed offer               Expected Discharge Plan and Services                                               Social Drivers of Health (SDOH) Interventions SDOH Screenings   Food Insecurity: Patient Unable To Answer (10/20/2023)  Housing: Unknown (10/20/2023)  Transportation Needs: Patient Unable To Answer (10/20/2023)  Utilities: Patient Unable To Answer (10/20/2023)  Depression (PHQ2-9): Low Risk  (10/26/2020)  Social Connections: Patient Unable To Answer (10/20/2023)  Tobacco Use: High Risk (10/20/2023)    Readmission Risk Interventions    10/24/2023    2:14 PM  Readmission Risk Prevention Plan  Transportation Screening Complete  PCP or Specialist Appt within 3-5 Days Complete  HRI or Home Care Consult Complete  Social Work Consult for Recovery Care Planning/Counseling Complete  Palliative Care Screening Complete  Medication Review [facility]) Referral to Pharmacy

## 2023-10-30 DIAGNOSIS — Z515 Encounter for palliative care: Secondary | ICD-10-CM | POA: Diagnosis not present

## 2023-10-30 DIAGNOSIS — G9341 Metabolic encephalopathy: Secondary | ICD-10-CM | POA: Diagnosis not present

## 2023-10-30 DIAGNOSIS — Z7189 Other specified counseling: Secondary | ICD-10-CM | POA: Diagnosis not present

## 2023-10-30 DIAGNOSIS — R4182 Altered mental status, unspecified: Secondary | ICD-10-CM | POA: Diagnosis not present

## 2023-10-30 LAB — GLUCOSE, CAPILLARY
Glucose-Capillary: 112 mg/dL — ABNORMAL HIGH (ref 70–99)
Glucose-Capillary: 126 mg/dL — ABNORMAL HIGH (ref 70–99)
Glucose-Capillary: 114 mg/dL — ABNORMAL HIGH (ref 70–99)
Glucose-Capillary: 144 mg/dL — ABNORMAL HIGH (ref 70–99)

## 2023-10-30 MED ORDER — QUETIAPINE FUMARATE 25 MG PO TABS
50.0000 mg | ORAL_TABLET | Freq: Every day | ORAL | Status: DC
  Administered 2023-10-31 – 2023-11-10 (×10): 50 mg via ORAL
  Filled 2023-10-30 (×11): qty 2

## 2023-10-30 NOTE — Progress Notes (Signed)
 PROGRESS NOTE    Allison Hughes  FMW:[id number] DOB: 10/04/1950 DOA: 10/20/2023 PCP: Valma Carwin, MD   Brief Narrative:    73 y.o. female with medical history significant of hypertension, hyperlipidemia, CVA, COPD, diabetes mellitus type 2, GI bleed, neuroendocrine ulcer, and obesity who presents after being found on the floor confused she is being treated for acute metabolic encephalopathy in the setting of urinary tract infection, acute hypoxic respiratory failure secondary to COVID-19 infection and electrolytes are being repleted. Pending SNF placement.  Assessment & Plan:  Principal Problem:   Acute metabolic encephalopathy Active Problems:   Urinary tract infection   Acute respiratory failure with hypoxia (HCC)   Chronic diastolic CHF (congestive heart failure) (HCC)   Fall at home, initial encounter   Essential hypertension   DM (diabetes mellitus), type 2 (HCC)   Hyperlipidemia   History of stroke   History of gastric ulcer   Acute metabolic encephalopathy: resolved In the setting of baseline cognitive impairment.  Mentation now around patient's baseline per s/o -- waxes and wanes. CT head is negative for acute intracranial abnormality.  MRI brain showing No acute intracranial abnormality. Old left cerebellar infarct. Early confluent white matter T2 hyperintensity consistent with chronic small vessel disease.Generalized cerebral volume loss. EEG did not show any seizures.  --Suspect from UTI --Finished course of IV ceftriaxone .        Acute hypoxic respiratory failure secondary to COVID Currently, requiring about 3 L of [place] oxygen.  Positive for COVID-19 infection 10/16 -- on 3L O2 with sats 98-100% --WEAN OXYGEN, target spO2 > 90% -Dced corticosteroids on 10/9 -Initial bronchodilator therapy as needed     Left foot and ankle pain and some swelling on the dorsum of the foot X rays of the left foot showed evidence of soft tissue swelling without any acute  fractures.   Hypokalemia and hypomagnesemia As needed repletion  Hospital acquired delirium: Frequent reorientation Dced steroids Started on seroquel  25 mg at bedtime on 10/9.    H/o CVA  Continue with PT and OT     GERD Continue with PPI.    Body mass index is 33.1 kg/m. Obesity.      Hyperlipidemia Resumed statin      Controlled type 2 DM  Resume SSI.  A1c is 6.9% Diabetes coordinator on board, appreciate assistance   Disposition: Patient was living at home by herself.  PT/OT consulted.  She will need skilled nursing facility upon discharge. Awaiting placement at [facility], pending insurance auth and 10 day Covid quarantine before d/c.  Palliative care on board.  DVT prophylaxis: enoxaparin  (LOVENOX ) injection 40 mg Start: 10/20/23 1800     Code Status: Full Code  Family Communication:  S/O Phillip at bedside on rounds  Status is: Inpatient Remains inpatient appropriate because: Awaiting SNF placement after required 10 day quarantine due to Covid+ that facility requires.    Subjective:  Pt seen awake resting in bed today. She asks about going home.  Denies feeling sick or having pain.  No acute complaints or events reported.    Examination:  General exam: awake, alert, no acute distress, confused HEENT: moist mucus membranes, hard of hearing  Respiratory system: CTAB, no wheezes, rales or rhonchi, normal respiratory effort. Cardiovascular system: normal S1/S2, RRR, no pedal edema.   Gastrointestinal system: soft, NT, ND, +bowel sounds. Central nervous system: A&O x self, hospital. no gross focal neurologic deficits, normal speech Extremities: moves all , no edema, normal tone Skin: dry, intact, normal temperature  Diet Orders (From admission, onward)     Start     Ordered   10/20/23 1617  Diet Carb Modified Fluid consistency: Thin; Room service appropriate? Yes  Diet effective now       Question Answer Comment  Diet-HS Snack? Nothing    Calorie Level Medium 1600-2000   Fluid consistency: Thin   Room service appropriate? Yes      10/20/23 1616            Objective: Vitals:   10/29/23 2325 10/30/23 0557 10/30/23 0816 10/30/23 0837  BP: (!) 141/69 (!) 167/69 (!) 153/71   Pulse: 78 62 72 72  Resp: 18 14 18 12   Temp: 98 F (36.7 C)  97.7 F (36.5 C)   TempSrc: Oral  Oral   SpO2: 98% 100% 98% 98%  Weight:      Height:        Intake/Output Summary (Last 24 hours) at 10/30/2023 1250 Last data filed at 10/30/2023 1016 Gross per 24 hour  Intake 4839.59 ml  Output --  Net 4839.59 ml    Filed Weights   10/25/23 0710 10/26/23 0500 10/28/23 0500  Weight: 71 kg 69.6 kg 70.8 kg    Scheduled Meds:  atorvastatin   40 mg Oral Daily   budesonide -glycopyrrolate -formoterol   2 puff Inhalation BID   vitamin B-12  500 mcg Oral Daily   enoxaparin  (LOVENOX ) injection  40 mg Subcutaneous Q24H   insulin  aspart  0-9 Units Subcutaneous TID WC   insulin  glargine-yfgn  15 Units Subcutaneous QHS   lisinopril   30 mg Oral QHS   Ensure Max Protein  11 oz Oral BID   QUEtiapine   50 mg Oral QHS   sodium chloride  flush  3 mL Intravenous Q12H   Continuous Infusions:    Nutritional status Signs/Symptoms:  (A1c 6.9, CBGs > 180)   Body mass index is 28.55 kg/m.  Data Reviewed:   CBC: No results for input(s): WBC, NEUTROABS, HGB, HCT, MCV, PLT in the last 168 hours.  Basic Metabolic Panel: Recent Labs  Lab 10/29/23 1105  NA 139  K 3.8  CL 105  CO2 18*  GLUCOSE 254*  BUN 9  CREATININE 0.55  CALCIUM  8.6*   GFR: Estimated Creatinine Clearance: 58.6 mL/min (by C-G formula based on SCr of 0.55 mg/dL). Liver Function Tests: No results for input(s): AST, ALT, ALKPHOS, BILITOT, PROT, ALBUMIN in the last 168 hours.  No results for input(s): LIPASE, AMYLASE in the last 168 hours. No results for input(s): AMMONIA in the last 168 hours.  Coagulation Profile: No results for input(s):  INR, PROTIME in the last 168 hours. Cardiac Enzymes: No results for input(s): CKTOTAL, CKMB, CKMBINDEX, TROPONINI in the last 168 hours.  BNP (last 3 results) No results for input(s): PROBNP in the last 8760 hours. HbA1C: No results for input(s): HGBA1C in the last 72 hours. CBG: Recent Labs  Lab 10/29/23 1124 10/29/23 1611 10/29/23 2024 10/30/23 0925 10/30/23 1204  GLUCAP 254* 88 130* 112* 126*   Lipid Profile: No results for input(s): CHOL, HDL, LDLCALC, TRIG, CHOLHDL, LDLDIRECT in the last 72 hours. Thyroid Function Tests: No results for input(s): TSH, T4TOTAL, FREET4, T3FREE, THYROIDAB in the last 72 hours.  Anemia Panel: No results for input(s): VITAMINB12, FOLATE, FERRITIN, TIBC, IRON , RETICCTPCT in the last 72 hours.  Sepsis Labs: No results for input(s): PROCALCITON, LATICACIDVEN in the last 168 hours.   Recent Results (from the past 240 hours)  SARS Coronavirus 2 by RT PCR (hospital order, performed  in [facility] hospital lab) *cepheid single result test* Anterior Nasal Swab     Status: Abnormal   Collection Time: 10/21/23  2:50 PM   Specimen: Anterior Nasal Swab  Result Value Ref Range Status   SARS Coronavirus 2 by RT PCR POSITIVE (A) NEGATIVE Final    Comment: Performed at [facility], [address] [place], [place] [address]  Respiratory (~20 pathogens) panel by PCR     Status: None   Collection Time: 10/21/23  2:50 PM   Specimen: Nasopharyngeal Swab; Respiratory  Result Value Ref Range Status   Adenovirus NOT DETECTED NOT DETECTED Final   Coronavirus 229E NOT DETECTED NOT DETECTED Final    Comment: (NOTE) The Coronavirus on the Respiratory Panel, DOES NOT test for the novel  Coronavirus (2019 nCoV)    Coronavirus HKU1 NOT DETECTED NOT DETECTED Final   Coronavirus NL63 NOT DETECTED NOT DETECTED Final   Coronavirus OC43 NOT DETECTED NOT DETECTED Final   Metapneumovirus NOT DETECTED NOT  DETECTED Final   Rhinovirus / Enterovirus NOT DETECTED NOT DETECTED Final   Influenza A NOT DETECTED NOT DETECTED Final   Influenza B NOT DETECTED NOT DETECTED Final   Parainfluenza Virus 1 NOT DETECTED NOT DETECTED Final   Parainfluenza Virus 2 NOT DETECTED NOT DETECTED Final   Parainfluenza Virus 3 NOT DETECTED NOT DETECTED Final   Parainfluenza Virus 4 NOT DETECTED NOT DETECTED Final   Respiratory Syncytial Virus NOT DETECTED NOT DETECTED Final   Bordetella pertussis NOT DETECTED NOT DETECTED Final   Bordetella Parapertussis NOT DETECTED NOT DETECTED Final   Chlamydophila pneumoniae NOT DETECTED NOT DETECTED Final   Mycoplasma pneumoniae NOT DETECTED NOT DETECTED Final    Comment: Performed at [facility], [address] [place], [place] [address]         Radiology Studies: No results found.      LOS: 9 days   Time spent  38 mins    Burnard DELENA Cunning, DO [facility]  If 7PM-7AM, please contact night-coverage  10/30/2023, 12:50 PM

## 2023-10-30 NOTE — Plan of Care (Signed)

## 2023-10-30 NOTE — Plan of Care (Signed)
  Problem: Education: Goal: Ability to describe self-care measures that may prevent or decrease complications (Diabetes Survival Skills Education) will improve 10/30/2023 0751 by Eduardo Sandrea LABOR, RN Outcome: Progressing 10/30/2023 0751 by Eduardo Sandrea LABOR, RN Outcome: Progressing Goal: Individualized Educational Video(s) 10/30/2023 0751 by Eduardo Sandrea LABOR, RN Outcome: Progressing 10/30/2023 0751 by Eduardo Sandrea LABOR, RN Outcome: Progressing   Problem: Coping: Goal: Ability to adjust to condition or change in health will improve 10/30/2023 0751 by Eduardo Sandrea LABOR, RN Outcome: Progressing 10/30/2023 0751 by Eduardo Sandrea LABOR, RN Outcome: Progressing   Problem: Fluid Volume: Goal: Ability to maintain a balanced intake and output will improve 10/30/2023 0751 by Eduardo Sandrea LABOR, RN Outcome: Progressing 10/30/2023 0751 by Eduardo Sandrea LABOR, RN Outcome: Progressing   Problem: Health Behavior/Discharge Planning: Goal: Ability to identify and utilize available resources and services will improve 10/30/2023 0751 by Eduardo Sandrea LABOR, RN Outcome: Progressing 10/30/2023 0751 by Eduardo Sandrea LABOR, RN Outcome: Progressing Goal: Ability to manage health-related needs will improve 10/30/2023 0751 by Eduardo Sandrea LABOR, RN Outcome: Progressing 10/30/2023 0751 by Eduardo Sandrea LABOR, RN Outcome: Progressing   Problem: Tissue Perfusion: Goal: Adequacy of tissue perfusion will improve 10/30/2023 0751 by Eduardo Sandrea LABOR, RN Outcome: Progressing 10/30/2023 0751 by Eduardo Sandrea LABOR, RN Outcome: Progressing   Problem: Education: Goal: Knowledge of General Education information will improve Description: Including pain rating scale, medication(s)/side effects and non-pharmacologic comfort measures 10/30/2023 0751 by Eduardo Sandrea LABOR, RN Outcome: Progressing 10/30/2023 0751 by Eduardo Sandrea LABOR, RN Outcome: Progressing   Problem: Health Behavior/Discharge Planning: Goal: Ability to manage  health-related needs will improve 10/30/2023 0751 by Eduardo Sandrea LABOR, RN Outcome: Progressing 10/30/2023 0751 by Eduardo Sandrea LABOR, RN Outcome: Progressing   Problem: Activity: Goal: Risk for activity intolerance will decrease 10/30/2023 0751 by Eduardo Sandrea LABOR, RN Outcome: Progressing 10/30/2023 0751 by Eduardo Sandrea LABOR, RN Outcome: Progressing   Problem: Pain Managment: Goal: General experience of comfort will improve and/or be controlled 10/30/2023 0751 by Eduardo Sandrea LABOR, RN Outcome: Progressing 10/30/2023 0751 by Eduardo Sandrea LABOR, RN Outcome: Progressing   Problem: Safety: Goal: Ability to remain free from injury will improve 10/30/2023 0751 by Eduardo Sandrea LABOR, RN Outcome: Progressing 10/30/2023 0751 by Eduardo Sandrea LABOR, RN Outcome: Progressing

## 2023-10-30 NOTE — Progress Notes (Signed)
 Physical Therapy Treatment Patient Details Name: Allison Hughes MRN: [id number] DOB: Jul 17, 1950 Today's Date: 10/30/2023   History of Present Illness 73 yo female found down on floor 10/6. Recent hospitalization 9/5-9/10 found on floor hypoxic with PNA and UTI d/c to [facility].  Currently pt with confusion, paranoid thoughts and forgetfulness. Found to have COVID and acute metabolic encephalopathy. PMH HTN HLD CVA COPD DM2 GIB, Obesity    PT Comments  Pt found sitting in bed with legs splayed and bed soaked in urine. With increased cuing pt agrees to sit in recliner while bed linens are changed. Pt continues to be limited in safe mobility by poor cognition, in presence of decreased strength and balance. Pt is mod A for bed mobility and sit to stand and step pivot transfers to and from recliner. D/c plans remain appropriate at this time. PT will continue to follow acutely.     If plan is discharge home, recommend the following: A lot of help with walking and/or transfers;A lot of help with bathing/dressing/bathroom;Assistance with cooking/housework;Direct supervision/assist for medications management;Direct supervision/assist for financial management;Assist for transportation;Assistance with feeding;Help with stairs or ramp for entrance;Supervision due to cognitive status   Can travel by private vehicle     Yes  Equipment Recommendations  Wheelchair (measurements PT);Wheelchair cushion (measurements PT)       Precautions / Restrictions Precautions Precautions: Fall Recall of Precautions/Restrictions: Impaired Precaution/Restrictions Comments: AMS, COVID precautions, watch SpO2 Restrictions Weight Bearing Restrictions Per Provider Order: No     Mobility  Bed Mobility Overal bed mobility: Needs Assistance Bed Mobility: Supine to Sit, Sit to Supine     Supine to sit: HOB elevated, Min assist, Used rails Sit to supine: HOB elevated, Min assist   General bed mobility  comments: pt requires min A for sequencing LE off bed and bringing trunk to upright, able to get R foot in bed, needs assist with bringing L LE into bed and bridging hips to Hughes of bed and lift shoulders to move to Hughes of bed    Transfers Overall transfer level: Needs assistance Equipment used: 1 person hand held assist Transfers: Sit to/from Stand Sit to Stand: Mod assist Stand pivot transfers: Mod assist         General transfer comment: pt is modA for face to face transfer to standing from bed and pivot to chair to place clean linens and for standing from recliner and stepping back to bed          Balance Overall balance assessment: Needs assistance Sitting-balance support: Feet supported, No upper extremity supported Sitting balance-Leahy Scale: Fair Sitting balance - Comments: able to static sit but given CGA for safety   Standing balance support: Bilateral upper extremity supported Standing balance-Leahy Scale: Zero                              Communication Communication Communication: Impaired (per prior notes) Factors Affecting Communication: Hearing impaired  Cognition Arousal: Alert Behavior During Therapy: Flat affect   PT - Cognitive impairments: No family/caregiver present to determine baseline, History of cognitive impairments, Orientation, Attention, Sequencing, Awareness   Orientation impairments: Place, Time, Situation                   PT - Cognition Comments: unaware that her bed is soaked in urine, continues to not be very interactive with therapy, Following commands: Impaired Following commands impaired: Follows one step commands inconsistently, Follows  one step commands with increased time    Cueing Cueing Techniques: Verbal cues, Gestural cues, Tactile cues, Visual cues  Exercises General Exercises - Lower Extremity Long Arc Quad: AROM, Both, 10 reps, Seated Hip Flexion/Marching: AROM, Both, 10 reps, Seated     General Comments General comments (skin integrity, edema, etc.): BP 143/73 on 1L O2 via [place] SpO2 95%O2      Pertinent Vitals/Pain Pain Assessment Pain Assessment: No/denies pain     PT Goals (current goals can now be found in the care plan section) Acute Rehab PT Goals PT Goal Formulation: With patient Time For Goal Achievement: 11/04/23 Potential to Achieve Goals: Fair Progress towards PT goals: Progressing toward goals    Frequency    Min 2X/week       AM-PAC PT 6 Clicks Mobility   Outcome Measure  Help needed turning from your back to your side while in a flat bed without using bedrails?: A Little Help needed moving from lying on your back to sitting on the side of a flat bed without using bedrails?: A Little Help needed moving to and from a bed to a chair (including a wheelchair)?: A Lot Help needed standing up from a chair using your arms (e.g., wheelchair or bedside chair)?: A Lot Help needed to walk in hospital room?: Total Help needed climbing 3-5 steps with a railing? : Total 6 Click Score: 12    End of Session Equipment Utilized During Treatment: Oxygen Activity Tolerance: Patient tolerated treatment well Patient left: in bed;with call bell/phone within reach;with bed alarm set Nurse Communication: Mobility status PT Visit Diagnosis: Muscle weakness (generalized) (M62.81);Repeated falls (R29.6);History of falling (Z91.81);Difficulty in walking, not elsewhere classified (R26.2);Pain;Unsteadiness on feet (R26.81);Other abnormalities of gait and mobility (R26.89) Pain - Right/Left: Left Pain - part of body: Ankle and joints of foot     Time: 8370-8344 PT Time Calculation (min) (ACUTE ONLY): 26 min  Charges:    $Therapeutic Exercise: 8-22 mins $Therapeutic Activity: 8-22 mins PT General Charges $$ ACUTE PT VISIT: 1 Visit                     Shanecia Hoganson B. Fleeta Lapidus PT, DPT Acute Rehabilitation Services Please use secure chat or  Call Office [phone]    Allison Hughes 10/30/2023, 5:04 PM

## 2023-10-30 NOTE — Progress Notes (Signed)
 Daily Progress Note   Date: 10/30/2023   Patient Name: Allison Hughes  DOB: [birth year]  MRN: [id number]  Age / Sex: 73 y.o., female  Attending Physician: Fausto Burnard LABOR, DO Primary Care Physician: Valma Carwin, MD Admit Date: 10/20/2023 Length of Stay: 9 days  Reason for Follow-up: Establishing goals of care  Past Medical History:  Diagnosis Date   Acute pyelonephritis 02/02/2016   Diabetes mellitus 01/15/2004   T2DM   Hyperlipidemia    Hypertension    Obesity (BMI 30-39.9)    Pneumonia 09/06/2020   Sepsis (HCC) 02/02/2016   Stroke (HCC)     Subjective:   Subjective: Chart Reviewed. Updates received. Patient Assessed. Created space and opportunity for patient  and family to explore thoughts and feelings regarding current medical situation.  Today's Discussion: Today before meeting with the patient/family, I reviewed the chart notes including OT note from 2 days ago, internal medicine note from 2 days ago, nursing note from 2 days ago, TOC note from yesterday, internal medicine note from yesterday, nursing note from yesterday, nursing note from today, internal medicine note from today. I also reviewed vital signs, nursing flowsheets, medication administrations record, labs, and imaging.  BMP yesterday shows stable electrolytes and creatinine in the setting of acute infection and respiratory infection.  Persistent elevated blood sugars, not surprising given infection and stress with readings ranging from 116-254.  Today saw the patient at bedside, no family was present.  She awakened easily to the sound of me entering the room.  She greeted me with a smile and said hello.  She knows her name, states the year is 31 (corrected to be 2025), and knows she is in the hospital.  She could not tell me much about why she is in the hospital so we discussed her UTI and COVID infection.  We discussed plan for discharge to SNF/rehab to try to help her get stronger and better to be able to go  home.  She denies any pain, nausea, vomiting.  No needs or requests at this time.  I asked who she would want to make her healthcare decisions that she cannot and she stated [name] which we confirmed this her friend.  I shared that I have been updating Phil and asked if it would be okay if we try to complete paperwork to make it official with that fill as her decision-maker should she not be able to make decisions and she said yes it is.  After seeing the patient I called and spoke with her friend Aleene who is her only point of contact.  We discussed attempts to complete HCPOA documentation while the patient is in the hospital, especially now with her mental status apparently clearing up some.  We talked about her disorientation to year stating 1925 and he chuckled and said yes she tends to do that, I think she is messing with people sometimes.  He feels that she is likely back to her baseline mental status.  However, he states that he agrees with the hospitalist that she likely has an element of waxing and waning hospital delirium due to being in a new environment.  We discussed plan for discharge to SNF/rehab after her COVID-19 quarantine is up (approximately 11/01/2023).  He had requested [facility] as a facility and they have excepted, insurance is approved.  However, paperwork will need to be refiled given the delay because of quarantine.  He is understanding of this.  He shares that he is coming up to visit  her shortly.  I shared that the patient's goals are clear, we would continue attempt HCPOA completion.  However, no further or significant palliative needs and so our team will back off.  However, we remain available should any needs arise.  I provided emotional and general support through therapeutic listening, empathy, sharing of stories, and other techniques. I answered all questions and addressed all concerns to the best of my ability.  Review of Systems  Constitutional:        Denies  pain in general  Respiratory:  Negative for shortness of breath.   Gastrointestinal:  Negative for abdominal pain, nausea and vomiting.    Objective:   Primary Diagnoses: Present on Admission:  Acute metabolic encephalopathy  Urinary tract infection  Acute respiratory failure with hypoxia (HCC)  Chronic diastolic CHF (congestive heart failure) (HCC)  Essential hypertension  Hyperlipidemia   Vital Signs:  BP (!) 153/71 (BP Location: Right Arm)   Pulse 72   Temp 97.7 F (36.5 C) (Oral)   Resp 12   Ht 5' 2 (1.575 m)   Wt 70.8 kg   SpO2 98%   BMI 28.55 kg/m   Physical Exam Vitals and nursing note reviewed.  Constitutional:      General: She is not in acute distress.    Appearance: She is ill-appearing.  HENT:     Head: Normocephalic and atraumatic.  Cardiovascular:     Rate and Rhythm: Normal rate.  Pulmonary:     Effort: Pulmonary effort is normal. No respiratory distress.  Abdominal:     General: Abdomen is flat.  Skin:    General: Skin is warm and dry.  Neurological:     Mental Status: She is disoriented and confused.  Psychiatric:        Mood and Affect: Mood normal.        Behavior: Behavior normal.     Palliative Assessment/Data: 60%   Existing Vynca/ACP Documentation: None  Assessment & Plan:   HPI/Patient Profile:  72 y.o. female  with past medical history of hypertension, hyperlipidemia, CVA, COPD, diabetes mellitus type 2, GI bleed, neuroendocrine ulcer, and obesity who presents after being found on the floor confused she is being treated for acute metabolic encephalopathy in the setting of urinary tract infection, acute hypoxic respiratory failure secondary to COVID-19 infection.   SUMMARY OF RECOMMENDATIONS   Full code Full scope of care Completion of HCPOA documentation now that mental status seeming to improve Recommended outpatient palliative care at discharge, [facility] consult previously placed Anticipate discharge to SNF/rehab in 48 to 72  hours Palliative medicine will back off at this time Please notify us  of any significant clinical change or new palliative needs  Symptom Management:  Per primary team Palliative medicine is available to assist as needed  Code Status: Full Code  Prognosis: Unable to determine  Discharge Planning: Skilled Nursing Facility for rehab with Palliative care service follow-up  Discussed with: Patient, patient's friend, medical team, nursing team, [facility] team  Thank you for allowing us  to participate in the care of TAYGAN CONNELL PMT will continue to support holistically.  Billing based on MDM: Moderate  Detailed review of medical records (labs, imaging, vital signs), medically appropriate exam, discussed with treatment team, counseling and education to patient, family, & staff, documenting clinical information, medication management, coordination of care  Camellia Kays, NP Palliative Medicine Team  Team Phone # [phone] (Nights/Weekends)  09/12/2020, 8:17 AM

## 2023-10-31 ENCOUNTER — Encounter (HOSPITAL_COMMUNITY): Payer: Self-pay | Admitting: Internal Medicine

## 2023-10-31 DIAGNOSIS — G9341 Metabolic encephalopathy: Secondary | ICD-10-CM | POA: Diagnosis not present

## 2023-10-31 LAB — MAGNESIUM: Magnesium: 1.6 mg/dL — ABNORMAL LOW (ref 1.7–2.4)

## 2023-10-31 LAB — GLUCOSE, CAPILLARY
Glucose-Capillary: 139 mg/dL — ABNORMAL HIGH (ref 70–99)
Glucose-Capillary: 279 mg/dL — ABNORMAL HIGH (ref 70–99)
Glucose-Capillary: 97 mg/dL (ref 70–99)
Glucose-Capillary: 97 mg/dL (ref 70–99)

## 2023-10-31 LAB — CBC
HCT: 39.7 % (ref 36.0–46.0)
Hemoglobin: 12 g/dL (ref 12.0–15.0)
MCH: 22.3 pg — ABNORMAL LOW (ref 26.0–34.0)
MCHC: 30.2 g/dL (ref 30.0–36.0)
MCV: 73.9 fL — ABNORMAL LOW (ref 80.0–100.0)
Platelets: 352 K/uL (ref 150–400)
RBC: 5.37 MIL/uL — ABNORMAL HIGH (ref 3.87–5.11)
RDW: 22.6 % — ABNORMAL HIGH (ref 11.5–15.5)
WBC: 6.5 K/uL (ref 4.0–10.5)
nRBC: 0 % (ref 0.0–0.2)

## 2023-10-31 LAB — BASIC METABOLIC PANEL WITH GFR
Anion gap: 12 (ref 5–15)
BUN: 8 mg/dL (ref 8–23)
CO2: 24 mmol/L (ref 22–32)
Calcium: 9.2 mg/dL (ref 8.9–10.3)
Chloride: 104 mmol/L (ref 98–111)
Creatinine, Ser: 0.46 mg/dL (ref 0.44–1.00)
GFR, Estimated: >60 mL/min (ref >60)
Glucose, Bld: 125 mg/dL — ABNORMAL HIGH (ref 70–99)
Potassium: 2.9 mmol/L — ABNORMAL LOW (ref 3.5–5.1)
Sodium: 140 mmol/L (ref 135–145)

## 2023-10-31 MED ORDER — POTASSIUM CHLORIDE CRYS ER 20 MEQ PO TBCR
40.0000 meq | EXTENDED_RELEASE_TABLET | ORAL | Status: AC
  Administered 2023-10-31 (×2): 40 meq via ORAL
  Filled 2023-10-31 (×2): qty 2

## 2023-10-31 MED ORDER — ENSURE MAX PROTEIN PO LIQD: 11.0000 [oz_av] | Freq: Two times a day (BID) | ORAL | Status: DC

## 2023-10-31 MED ORDER — MAGNESIUM SULFATE 2 GM/50ML IV SOLN
2.0000 g | Freq: Once | INTRAVENOUS | Status: AC
  Administered 2023-10-31: 2 g via INTRAVENOUS
  Filled 2023-10-31: qty 50

## 2023-10-31 MED ORDER — QUETIAPINE FUMARATE 50 MG PO TABS: 50.0000 mg | ORAL_TABLET | Freq: Every day | ORAL | Status: DC

## 2023-10-31 MED ORDER — LISINOPRIL 30 MG PO TABS: 30.0000 mg | ORAL_TABLET | Freq: Every day | ORAL | Status: DC

## 2023-10-31 MED ORDER — CYANOCOBALAMIN 500 MCG PO TABS: 500.0000 ug | ORAL_TABLET | Freq: Every day | ORAL | Status: DC

## 2023-10-31 MED ORDER — SLOW-MAG 71.5-119 MG PO TBEC: 1.0000 | DELAYED_RELEASE_TABLET | Freq: Every day | ORAL | Status: DC

## 2023-10-31 MED ORDER — POTASSIUM CHLORIDE CRYS ER 20 MEQ PO TBCR: 20.0000 meq | EXTENDED_RELEASE_TABLET | Freq: Every day | ORAL | Status: DC

## 2023-10-31 NOTE — Discharge Summary (Addendum)
 Physician Discharge Summary   Patient: Allison Hughes MRN: [id number] DOB: 05/15/1950  Admit date:     10/20/2023  Discharge date: 11/01/2023  Discharge Physician: Burnard DELENA Cunning   PCP: Valma Carwin, MD   Recommendations at discharge:   Follow up with Primary Care in 1-2 weeks Repeat CBC, BMP, Mg at follow up Follow up on tolerance and efficacy of Seroquel , which was started in place of trazodone for insomnia and dementia-related behaviors.  Discharge Diagnoses: Active Problems:   Chronic diastolic CHF (congestive heart failure) (HCC)   Fall at home, initial encounter   Essential hypertension   DM (diabetes mellitus), type 2 (HCC)   Hyperlipidemia   History of stroke   History of gastric ulcer  Principal Problem (Resolved):   Acute metabolic encephalopathy Resolved Problems:   Urinary tract infection   Acute respiratory failure with hypoxia [facility])  Hospital Course:  73 y.o. female with medical history significant of hypertension, hyperlipidemia, CVA, COPD, diabetes mellitus type 2, GI bleed, neuroendocrine ulcer, and obesity who presents after being found on the floor confused she is being treated for acute metabolic encephalopathy in the setting of urinary tract infection, acute hypoxic respiratory failure secondary to COVID-19 infection and electrolytes which have been replaced.  Further hospital course and management as outlined below.  Patient medically stable for discharge to SNF pending insurance authorization, having completed required 10 days isolation for covid.  Assessment and Plan:  Acute metabolic encephalopathy: resolved In the setting of baseline cognitive impairment.  Mentation now around patient's baseline per s/o -- he reports her confusion waxes and wanes. CT head is negative for acute intracranial abnormality.  MRI brain showing No acute intracranial abnormality. Old left cerebellar infarct. Early confluent white matter T2 hyperintensity consistent  with chronic small vessel disease.Generalized cerebral volume loss. EEG did not show any seizures.  --Suspect from UTI --Finished course of IV ceftriaxone .      Acute hypoxic respiratory failure secondary to COVID  Positive for COVID-19 infection 10/15--16 sats were 96-100% on 1-3 L O2 10/17 -- weaned to room air, sats 99% -stopped corticosteroids on 10/9 -bronchodilator as needed     Left foot and ankle pain and some swelling on the dorsum of the foot X rays of the left foot showed evidence of soft tissue swelling without any acute fractures. --PT --Tylenol  PRN   Hypokalemia and hypomagnesemia K 2.9 replaced. Mg 1.6 replaced. PO intake has been poor.  No GI losses. --Will d/c on daily K and Mg --Repeat labs in follow up, replace as needed   Hospital acquired delirium superimposed on baseline cognitive impairment --Frequent reorientation --Started on seroquel  25 mg at bedtime on 10/9, increased to 50 mg - continue at bedtime. History of insomnia --Stopped trazodone for seroquel  as above --PCP follow up   H/o CVA  --Continue with PT and OT --On statin, not on antiplatelet (defer to PCP)   GERD --Continue PPI   Hyperlipidemia --On statin    Controlled type 2 DM  A1c is 6.9% --Resume metformin  and Tresiba   --PCP follow up --Monitor sugars  Obesity Body mass index is 33.1 kg/m. Complicates overall care and prognosis.  Recommend lifestyle modifications including physical activity and diet for weight loss and overall long-term health.       Consultants: None Procedures performed: none  Disposition: Skilled nursing facility Diet recommendation:  Carb modified diet DISCHARGE MEDICATION: Allergies as of 10/31/2023       Reactions   Iodine Swelling   Shrimp [shellfish Allergy]  Swelling   Fresh shrimp        Medication List     STOP taking these medications    Breztri  Aerosphere 160-9-4.8 MCG/ACT Aero inhaler Generic drug:  budesonide -glycopyrrolate -formoterol    fosfomycin 3 g Pack Commonly known as: MONUROL   linezolid 600 MG tablet Commonly known as: ZYVOX   traZODone 50 MG tablet Commonly known as: DESYREL       TAKE these medications    acetaminophen  500 MG tablet Commonly known as: TYLENOL  Take 2 tablets (1,000 mg total) by mouth every 8 (eight) hours as needed for mild pain (pain score 1-3) or headache.   albuterol  108 (90 Base) MCG/ACT inhaler Commonly known as: VENTOLIN  HFA Inhale 2 puffs into the lungs every 6 (six) hours as needed for wheezing or shortness of breath.   ALPRAZolam  0.25 MG tablet Commonly known as: XANAX  Take 1 tablet (0.25 mg total) by mouth 2 (two) times daily as needed for anxiety.   atorvastatin  40 MG tablet Commonly known as: LIPITOR Take 40 mg by mouth daily.   cyanocobalamin  500 MCG tablet Commonly known as: VITAMIN B12 Take 1 tablet (500 mcg total) by mouth daily. Start taking on: November 01, 2023   Ensure Max Protein Liqd Take 330 mLs (11 oz total) by mouth 2 (two) times daily.   lisinopril  30 MG tablet Commonly known as: ZESTRIL  Take 1 tablet (30 mg total) by mouth at bedtime. What changed:  medication strength how much to take   metFORMIN  500 MG 24 hr tablet Commonly known as: GLUCOPHAGE -XR Take 500 mg by mouth at bedtime.   pantoprazole  40 MG tablet Commonly known as: PROTONIX  Take 1 tablet (40 mg total) by mouth 2 (two) times daily for 30 days, THEN 1 tablet (40 mg total) daily. Start taking on: September 23, 2023   potassium chloride  SA 20 MEQ tablet Commonly known as: KLOR-CON  M Take 1 tablet (20 mEq total) by mouth daily.   QUEtiapine  50 MG tablet Commonly known as: SEROQUEL  Take 1 tablet (50 mg total) by mouth at bedtime.   Slow-Mag 71.5-119 MG Tbec SR tablet Generic drug: magnesium  chloride Take 1 tablet (64 mg total) by mouth daily.   Trelegy Ellipta 100-62.5-25 MCG/ACT Aepb Generic drug: Fluticasone -Umeclidin-Vilant Inhale  1 puff into the lungs daily at 12 noon.   Tresiba  100 UNIT/ML Soln Generic drug: Insulin  Degludec Inject 15 Units into the skin at bedtime.        Discharge Exam: Filed Weights   10/25/23 0710 10/26/23 0500 10/28/23 0500  Weight: 71 kg 69.6 kg 70.8 kg   General exam: awake, alert, no acute distress HEENT: moist mucus membranes, hearing grossly normal  Respiratory system: CTAB, no wheezes, rales or rhonchi, normal respiratory effort. Cardiovascular system: normal S1/S2, RRR, no JVD, murmurs, rubs, gallops, no pedal edema.   Gastrointestinal system: soft, NT, ND, no HSM felt, +bowel sounds. Central nervous system: A&O x self. no gross focal neurologic deficits, normal speech Extremities: moves all, no edema, normal tone Skin: dry, intact, normal temperature Psychiatry: normal mood, congruent affect, pleasantly confused    Condition at discharge: stable  The results of significant diagnostics from this hospitalization (including imaging, microbiology, ancillary and laboratory) are listed below for reference.   Imaging Studies: CUP PACEART REMOTE DEVICE CHECK Result Date: 10/22/2023 ILR summary report received. Battery status OK. Normal device function. No new symptom, tachy, brady, or pause episodes. No new AF episodes. Monthly summary reports and ROV/PRN ML, CVRS  DG Foot Complete Left Result Date: 10/21/2023  CLINICAL DATA:  Left foot swelling.  No reported injury. EXAM: LEFT FOOT - COMPLETE 3+ VIEW COMPARISON:  None Available. FINDINGS: Mild dorsal and distal soft tissue swelling. No underlying bony abnormality. IMPRESSION: Mild soft tissue swelling. Electronically Signed   By: Elspeth Bathe M.D.   On: 10/21/2023 15:19   EEG adult Result Date: 10/21/2023 Shelton Arlin KIDD, MD     10/21/2023  1:18 PM Patient Name: Allison Hughes MRN: [id number] Epilepsy Attending: Arlin KIDD Shelton Referring Physician/Provider: Cherlyn Labella, MD Date: 10/21/2023 Duration: 22.50 mins Patient history:  73yo M with ams. EEG to evaluate for seizure Level of alertness: Awake, asleep AEDs during EEG study: None Technical aspects: This EEG study was done with scalp electrodes positioned according to the 10-20 International system of electrode placement. Electrical activity was reviewed with band pass filter of 1-70Hz , sensitivity of 7 uV/mm, display speed of 35mm/sec with a 60Hz  notched filter applied as appropriate. EEG data were recorded continuously and digitally stored.  Video monitoring was available and reviewed as appropriate. Description: The posterior dominant rhythm consists of 7.5 Hz activity of moderate voltage (25-35 uV) seen predominantly in posterior head regions, symmetric and reactive to eye opening and eye closing. Sleep was characterized by vertex waves, sleep spindles (12 to 14 Hz), maximal frontocentral region. Hyperventilation and photic stimulation were not performed.   IMPRESSION: This study is within normal limits. No seizures or epileptiform discharges were seen throughout the recording. A normal interictal EEG does not exclude the diagnosis of epilepsy. Arlin KIDD Shelton   MR BRAIN WO CONTRAST Result Date: 10/20/2023 EXAM: MRI BRAIN WITHOUT CONTRAST 10/20/2023 09:20:29 PM TECHNIQUE: Multiplanar multisequence MRI of the head/brain was performed without the administration of intravenous contrast. COMPARISON: 06/04/2022 CLINICAL HISTORY: Stroke, follow up. Altered mental status, stroke, weakness. FINDINGS: BRAIN AND VENTRICLES: No acute infarct. No intracranial hemorrhage. No mass. No midline shift. No hydrocephalus. Old Left cerebellar infarct. Early confluent hyperintense T2-weighted signal within the cerebral white matter, most commonly due to chronic small vessel disease. Generalized volume loss. The sella is unremarkable. Normal flow voids. ORBITS: No acute abnormality. SINUSES AND MASTOIDS: No acute abnormality. BONES AND SOFT TISSUES: Normal marrow signal. No acute soft tissue  abnormality. IMPRESSION: 1. No acute intracranial abnormality. 2. Old left cerebellar infarct. 3. Early confluent white matter T2 hyperintensity consistent with chronic small vessel disease. 4. Generalized cerebral volume loss. Electronically signed by: Franky Stanford MD 10/20/2023 11:29 PM EDT RP Workstation: [id number]   DG Abd 1 View Result Date: 10/20/2023 CLINICAL DATA:  Evaluate for metallic foreign body EXAM: ABDOMEN - 1 VIEW COMPARISON:  None Available. FINDINGS: Scattered large and small bowel gas is noted. No abnormal mass or abnormal calcifications are noted. Loop recorder is noted in the anterior left chest wall. No other metallic foreign body is seen. IMPRESSION: No foreign body within the abdomen. Loop recorder is noted left chest. Electronically Signed   By: Oneil Devonshire M.D.   On: 10/20/2023 21:20   CT Cervical Spine Wo Contrast Result Date: 10/20/2023 EXAM: CT CERVICAL SPINE WITHOUT CONTRAST 10/20/2023 10:54:35 AM TECHNIQUE: CT of the cervical spine was performed without the administration of intravenous contrast. Multiplanar reformatted images are provided for review. Automated exposure control, iterative reconstruction, and/or weight based adjustment of the mA/kV was utilized to reduce the radiation dose to as low as reasonably achievable. COMPARISON: None available. CLINICAL HISTORY: Neck trauma (Age >= 65y). Mental status change, unknown cause. FINDINGS: CERVICAL SPINE: BONES AND ALIGNMENT: No acute fracture or traumatic malalignment.  DEGENERATIVE CHANGES: Moderate chronic degenerative disc disease at C4-C5 and C5-C6, with posterior endplate ridging causing moderate central spinal canal stenosis at C5-C6 with severe bilateral neuroforaminal stenosis. There is also moderate central spinal canal stenosis and moderate-to-severe bilateral neural foraminal stenosis at C6-C7. SOFT TISSUES: No prevertebral soft tissue swelling. There is calcification within the carotid bulbs. There is mild  emphysema within the lung apices. IMPRESSION: 1. No acute abnormality of the cervical spine related to the reported neck trauma. 2. Moderate chronic degenerative disc disease at C4-5 and C5-6, with associated moderate central spinal canal stenosis at C5-6 and severe bilateral neuroforaminal stenosis. 3. Moderate central spinal canal stenosis and moderate-to-severe bilateral neural foraminal stenosis at C6-7. Electronically signed by: Evalene Coho MD 10/20/2023 11:09 AM EDT RP Workstation: [id number]   CT Head Wo Contrast Result Date: 10/20/2023 EXAM: CT HEAD WITHOUT CONTRAST 10/20/2023 10:54:35 AM TECHNIQUE: CT of the head was performed without the administration of intravenous contrast. Automated exposure control, iterative reconstruction, and/or weight based adjustment of the mA/kV was utilized to reduce the radiation dose to as low as reasonably achievable. COMPARISON: CT of the head dated 09/19/2023. CLINICAL HISTORY: Mental status change, unknown cause. FINDINGS: BRAIN AND VENTRICLES: No acute hemorrhage. No evidence of acute infarct. No hydrocephalus. No extra-axial collection. No mass effect or midline shift. There is moderate generalized cerebral volume loss. There are chronic encephalomalacia changes again noted within the right parietal lobe. Focal encephalomalacia changes are also again demonstrated in the left cerebellar hemisphere. There is moderately advanced diffuse cerebral white matter disease. Senescent calcifications are noted within the basal ganglia. There is moderate calcification within the carotid siphons. ORBITS: No acute abnormality. SINUSES: No acute abnormality. SOFT TISSUES AND SKULL: No acute soft tissue abnormality. No skull fracture. IMPRESSION: 1. No acute intracranial abnormality. 2. Moderate generalized cerebral volume loss and moderately advanced diffuse cerebral white matter disease. 3. 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  Electronically signed by: Evalene Coho MD 10/20/2023 11:06 AM EDT RP Workstation: [id number]   DG Chest Portable 1 View Result Date: 10/20/2023 EXAM: 1 VIEW XRAY OF THE CHEST 10/20/2023 10:09:00 AM COMPARISON: 09/19/2023 CLINICAL HISTORY: AMS. AMS- 3-4 weeks per family. FINDINGS: LINES, TUBES AND DEVICES: Left chest loop recorder device in place. LUNGS AND PLEURA: Decreased mild pulmonary edema. No focal pulmonary opacity. No pleural effusion. No pneumothorax. HEART AND MEDIASTINUM: No acute abnormality of the cardiac and mediastinal silhouettes. BONES AND SOFT TISSUES: No acute osseous abnormality. IMPRESSION: 1. Decreased mild pulmonary edema. Electronically signed by: Evalene Coho MD 10/20/2023 10:34 AM EDT RP Workstation: [id number]    Microbiology: Results for orders placed or performed during the hospital encounter of 10/20/23  SARS Coronavirus 2 by RT PCR (hospital order, performed in [facility] hospital lab) *cepheid single result test* Anterior Nasal Swab     Status: Abnormal   Collection Time: 10/21/23  2:50 PM   Specimen: Anterior Nasal Swab  Result Value Ref Range Status   SARS Coronavirus 2 by RT PCR POSITIVE (A) NEGATIVE Final    Comment: Performed at [facility], [address] [place], [place] [address]  Respiratory (~20 pathogens) panel by PCR     Status: None   Collection Time: 10/21/23  2:50 PM   Specimen: Nasopharyngeal Swab; Respiratory  Result Value Ref Range Status   Adenovirus NOT DETECTED NOT DETECTED Final   Coronavirus 229E NOT DETECTED NOT DETECTED Final    Comment: (NOTE) The Coronavirus on the Respiratory Panel, DOES NOT  test for the novel  Coronavirus (2019 nCoV)    Coronavirus HKU1 NOT DETECTED NOT DETECTED Final   Coronavirus NL63 NOT DETECTED NOT DETECTED Final   Coronavirus OC43 NOT DETECTED NOT DETECTED Final   Metapneumovirus NOT DETECTED NOT DETECTED Final   Rhinovirus / Enterovirus NOT DETECTED NOT DETECTED Final   Influenza A NOT  DETECTED NOT DETECTED Final   Influenza B NOT DETECTED NOT DETECTED Final   Parainfluenza Virus 1 NOT DETECTED NOT DETECTED Final   Parainfluenza Virus 2 NOT DETECTED NOT DETECTED Final   Parainfluenza Virus 3 NOT DETECTED NOT DETECTED Final   Parainfluenza Virus 4 NOT DETECTED NOT DETECTED Final   Respiratory Syncytial Virus NOT DETECTED NOT DETECTED Final   Bordetella pertussis NOT DETECTED NOT DETECTED Final   Bordetella Parapertussis NOT DETECTED NOT DETECTED Final   Chlamydophila pneumoniae NOT DETECTED NOT DETECTED Final   Mycoplasma pneumoniae NOT DETECTED NOT DETECTED Final    Comment: Performed at [facility], [address], [place], [place] [address]    Labs: CBC: Recent Labs  Lab 10/31/23 0721  WBC 6.5  HGB 12.0  HCT 39.7  MCV 73.9*  PLT 352   Basic Metabolic Panel: Recent Labs  Lab 10/29/23 1105 10/31/23 0721  NA 139 140  K 3.8 2.9*  CL 105 104  CO2 18* 24  GLUCOSE 254* 125*  BUN 9 8  CREATININE 0.55 0.46  CALCIUM  8.6* 9.2  MG  --  1.6*   Liver Function Tests: No results for input(s): AST, ALT, ALKPHOS, BILITOT, PROT, ALBUMIN in the last 168 hours. CBG: Recent Labs  Lab 10/30/23 1204 10/30/23 1633 10/30/23 2139 10/31/23 0859 10/31/23 1255  GLUCAP 126* 114* 144* 139* 279*    Discharge time spent: greater than 30 minutes.  Signed: Burnard DELENA Cunning, DO [facility] 10/31/2023

## 2023-10-31 NOTE — Plan of Care (Signed)
  Problem: Education: Goal: Ability to describe self-care measures that may prevent or decrease complications (Diabetes Survival Skills Education) will improve Outcome: Progressing Goal: Individualized Educational Video(s) Outcome: Progressing   Problem: Coping: Goal: Ability to adjust to condition or change in health will improve Outcome: Progressing   Problem: Fluid Volume: Goal: Ability to maintain a balanced intake and output will improve Outcome: Progressing   Problem: Fluid Volume: Goal: Ability to maintain a balanced intake and output will improve Outcome: Progressing   Problem: Health Behavior/Discharge Planning: Goal: Ability to identify and utilize available resources and services will improve Outcome: Progressing Goal: Ability to manage health-related needs will improve Outcome: Progressing   Problem: Tissue Perfusion: Goal: Adequacy of tissue perfusion will improve Outcome: Progressing   Problem: Education: Goal: Knowledge of General Education information will improve Description: Including pain rating scale, medication(s)/side effects and non-pharmacologic comfort measures Outcome: Progressing   Problem: Activity: Goal: Risk for activity intolerance will decrease Outcome: Progressing   Problem: Nutrition: Goal: Adequate nutrition will be maintained Outcome: Progressing

## 2023-10-31 NOTE — Progress Notes (Signed)
 Mobility Specialist Progress Note:   10/31/23 1020  Mobility  Activity Ambulated with assistance  Level of Assistance Minimal assist, patient does 75% or more  Assistive Device Other (Comment) (HHA)  Distance Ambulated (ft) 28 ft  Activity Response Tolerated well  Mobility Referral Yes  Mobility visit 1 Mobility  Mobility Specialist Start Time (ACUTE ONLY) 1020  Mobility Specialist Stop Time (ACUTE ONLY) 1040  Mobility Specialist Time Calculation (min) (ACUTE ONLY) 20 min   Pt agreeable to mobility session. Required minA via HHA to stand and ambulate. Cues to take bigger steps needed, with little correction. Pt with minor posterior lean at times during ambulation. No overt LOB noted, pt left sitting in chair with husband and sitter present.   Therisa Rana Mobility Specialist Please contact via SecureChat or  Rehab office at [phone]

## 2023-10-31 NOTE — Plan of Care (Signed)

## 2023-10-31 NOTE — Progress Notes (Signed)
 Occupational Therapy Treatment Patient Details Name: Allison Hughes MRN: [id number] DOB: 12/04/1950 Today's Date: 10/31/2023   History of present illness 73 yo female found down on floor 10/6. Recent hospitalization 9/5-9/10 found on floor hypoxic with PNA and UTI d/c to [facility].  Currently pt with confusion, paranoid thoughts and forgetfulness. Found to have COVID and acute metabolic encephalopathy. PMH HTN HLD CVA COPD DM2 GIB, Obesity   OT comments  Patient received seated up in recliner. Patient able to doff socks seated in figure 4 and required min assist to donn.  Patient ambulated to sink with RW and mod assist to stand and min/mod assist for balance and walker management with seated rest break before standing at sink for grooming tasks. Patient performed grooming tasks seated/standing at sink with min assist and frequent cues for sequencing and increased time to perform tasks. Patient asked to return to bed at end of session with min assist.  Patient will benefit from continued inpatient follow up therapy, <3 hours/day. Acute OT to continue to follow to address established goals to facilitate DC to next venue of care.        If plan is discharge home, recommend the following:  A lot of help with walking and/or transfers;A lot of help with bathing/dressing/bathroom;Assistance with cooking/housework;Assistance with feeding;Direct supervision/assist for medications management;Direct supervision/assist for financial management;Assist for transportation;Help with stairs or ramp for entrance;Supervision due to cognitive status   Equipment Recommendations  BSC/3in1;Wheelchair (measurements OT);Wheelchair cushion (measurements OT) (RW)    Recommendations for Other Services      Precautions / Restrictions Precautions Precautions: Fall Recall of Precautions/Restrictions: Impaired Precaution/Restrictions Comments: AMS, COVID precautions, watch SpO2 Restrictions Weight Bearing  Restrictions Per Provider Order: No       Mobility Bed Mobility Overal bed mobility: Needs Assistance Bed Mobility: Sit to Supine       Sit to supine: HOB elevated, Min assist   General bed mobility comments: assistance to initiate    Transfers Overall transfer level: Needs assistance Equipment used: Rolling walker (2 wheels) Transfers: Sit to/from Stand Sit to Stand: Mod assist           General transfer comment: mod assist for sit to stand with cues for hand placement.  Min to mod assist with RW use with cues for safety     Balance Overall balance assessment: Needs assistance Sitting-balance support: Feet supported, No upper extremity supported Sitting balance-Leahy Scale: Fair Sitting balance - Comments: EOB   Standing balance support: Single extremity supported, Bilateral upper extremity supported, No upper extremity supported Standing balance-Leahy Scale: Poor Standing balance comment: reliant on RW or sink for support                           ADL either performed or assessed with clinical judgement   ADL Overall ADL's : Needs assistance/impaired     Grooming: Wash/dry hands;Wash/dry face;Oral care;Brushing hair;Minimal assistance;Standing;Cueing for sequencing Grooming Details (indicate cue type and reason): patient perseverating on washing face and brushing teeth and required cues to change tasks             Lower Body Dressing: Minimal assistance;Sitting/lateral leans Lower Body Dressing Details (indicate cue type and reason): changed socks with patient able to doff socks but required min assist to initiate with patient using figure 4 position Toilet Transfer: Moderate assistance;Rolling walker (2 wheels) Toilet Transfer Details (indicate cue type and reason): simulated  General ADL Comments: cues for sequencing due to perseverating on tasks    Extremity/Trunk Assessment              Vision       Perception      Praxis     Communication Communication Communication: Impaired Factors Affecting Communication: Hearing impaired   Cognition Arousal: Alert Behavior During Therapy: Flat affect Cognition: Cognition impaired   Orientation impairments: Place, Time, Situation Awareness: Intellectual awareness impaired, Online awareness impaired Memory impairment (select all impairments): Short-term memory, Working [employer] fast streamer, Non-declarative long-term memory, Geneticist, molecular long-term memory Attention impairment (select first level of impairment): Focused attention Executive functioning impairment (select all impairments): Initiation OT - Cognition Comments: perseverated on tasks                 Following commands: Impaired Following commands impaired: Follows one step commands inconsistently, Follows one step commands with increased time      Cueing   Cueing Techniques: Verbal cues, Gestural cues, Tactile cues, Visual cues  Exercises      Shoulder Instructions       General Comments VSS on RA    Pertinent Vitals/ Pain       Pain Assessment Pain Assessment: No/denies pain  Home Living                                          Prior Functioning/Environment              Frequency  Min 2X/week        Progress Toward Goals  OT Goals(current goals can now be found in the care plan section)  Progress towards OT goals: Progressing toward goals  Acute Rehab OT Goals Patient Stated Goal: none stated OT Goal Formulation: With patient Time For Goal Achievement: 11/04/23 Potential to Achieve Goals: Fair ADL Goals Pt Will Perform Grooming: with mod assist;sitting Pt Will Perform Upper Body Bathing: with mod assist;sitting Pt Will Transfer to Toilet: with mod assist;bedside commode;stand pivot transfer Additional ADL Goal #1: pt will complete bed mobility mod (A) Additional ADL Goal #2: pt will follow 2 step command 50% of attempts  Plan      Co-evaluation                  AM-PAC OT 6 Clicks Daily Activity     Outcome Measure   Help from another person eating meals?: A Little Help from another person taking care of personal grooming?: A Little Help from another person toileting, which includes using toliet, bedpan, or urinal?: A Lot Help from another person bathing (including washing, rinsing, drying)?: A Lot Help from another person to put on and taking off regular upper body clothing?: A Lot Help from another person to put on and taking off regular lower body clothing?: A Lot 6 Click Score: 14    End of Session Equipment Utilized During Treatment: Gait belt;Rolling walker (2 wheels)  OT Visit Diagnosis: Unsteadiness on feet (R26.81);Muscle weakness (generalized) (M62.81)   Activity Tolerance Patient tolerated treatment well   Patient Left in bed;with call bell/phone within reach;with bed alarm set;with nursing/sitter in room   Nurse Communication Mobility status        Time: 8753-8672 OT Time Calculation (min): 41 min  Charges: OT General Charges $OT Visit: 1 Visit OT Treatments $Self Care/Home Management : 23-37 mins $Therapeutic Activity: 8-22 mins  Dick Laine, OTA Acute Rehabilitation  Services  Office [phone]   Jeb LITTIE Laine 10/31/2023, 1:41 PM

## 2023-11-01 DIAGNOSIS — R4182 Altered mental status, unspecified: Secondary | ICD-10-CM | POA: Diagnosis not present

## 2023-11-01 DIAGNOSIS — G9341 Metabolic encephalopathy: Secondary | ICD-10-CM | POA: Diagnosis not present

## 2023-11-01 DIAGNOSIS — Z794 Long term (current) use of insulin: Secondary | ICD-10-CM | POA: Diagnosis not present

## 2023-11-01 DIAGNOSIS — E1165 Type 2 diabetes mellitus with hyperglycemia: Secondary | ICD-10-CM | POA: Diagnosis not present

## 2023-11-01 LAB — COMPREHENSIVE METABOLIC PANEL WITH GFR
ALT: 32 U/L (ref 0–44)
AST: 35 U/L (ref 15–41)
Albumin: 2.8 g/dL — ABNORMAL LOW (ref 3.5–5.0)
Alkaline Phosphatase: 120 U/L (ref 38–126)
Anion gap: 8 (ref 5–15)
BUN: 9 mg/dL (ref 8–23)
CO2: 25 mmol/L (ref 22–32)
Calcium: 9.2 mg/dL (ref 8.9–10.3)
Chloride: 108 mmol/L (ref 98–111)
Creatinine, Ser: 0.56 mg/dL (ref 0.44–1.00)
GFR, Estimated: >60 mL/min (ref >60)
Glucose, Bld: 115 mg/dL — ABNORMAL HIGH (ref 70–99)
Potassium: 3.2 mmol/L — ABNORMAL LOW (ref 3.5–5.1)
Sodium: 141 mmol/L (ref 135–145)
Total Bilirubin: 0.4 mg/dL (ref 0.0–1.2)
Total Protein: 4.7 g/dL — ABNORMAL LOW (ref 6.5–8.1)

## 2023-11-01 LAB — GLUCOSE, CAPILLARY
Glucose-Capillary: 105 mg/dL — ABNORMAL HIGH (ref 70–99)
Glucose-Capillary: 168 mg/dL — ABNORMAL HIGH (ref 70–99)
Glucose-Capillary: 135 mg/dL — ABNORMAL HIGH (ref 70–99)

## 2023-11-01 MED ORDER — HALOPERIDOL LACTATE 5 MG/ML IJ SOLN
5.0000 mg | Freq: Once | INTRAMUSCULAR | Status: AC
  Administered 2023-11-01: 5 mg via INTRAMUSCULAR
  Filled 2023-11-01: qty 1

## 2023-11-01 MED ORDER — POTASSIUM CHLORIDE 2 MEQ/ML IV SOLN
INTRAVENOUS | Status: AC
  Filled 2023-11-01 (×2): qty 1000

## 2023-11-01 MED ORDER — POTASSIUM CHLORIDE CRYS ER 20 MEQ PO TBCR
40.0000 meq | EXTENDED_RELEASE_TABLET | ORAL | Status: AC
  Administered 2023-11-01: 40 meq via ORAL
  Filled 2023-11-01 (×2): qty 2

## 2023-11-01 MED ORDER — QUETIAPINE FUMARATE 25 MG PO TABS
25.0000 mg | ORAL_TABLET | Freq: Every morning | ORAL | Status: DC
  Administered 2023-11-02 – 2023-11-04 (×3): 25 mg via ORAL
  Filled 2023-11-01 (×3): qty 1

## 2023-11-01 NOTE — Plan of Care (Signed)

## 2023-11-01 NOTE — Hospital Course (Signed)
 73 y.o. female with medical history significant of hypertension, hyperlipidemia, CVA, COPD, diabetes mellitus type 2, GI bleed, neuroendocrine ulcer, and obesity who presents after being found on the floor confused she is being treated for acute metabolic encephalopathy in the setting of urinary tract infection, acute hypoxic respiratory failure secondary to COVID-19 infection and electrolytes which have been replaced.   Further hospital course and management as outlined below.   Patient medically stable for discharge to SNF pending insurance authorization, having completed required 10 days isolation for covid.

## 2023-11-01 NOTE — Progress Notes (Signed)
 Progress Note   Patient: Allison Hughes FMW:[id number] DOB: 30-Mar-1950 DOA: 10/20/2023     11 DOS: the patient was seen and examined on 11/01/2023   Brief hospital course: 73 y.o. female with medical history significant of hypertension, hyperlipidemia, CVA, COPD, diabetes mellitus type 2, GI bleed, neuroendocrine ulcer, and obesity who presents after being found on the floor confused she is being treated for acute metabolic encephalopathy in the setting of urinary tract infection, acute hypoxic respiratory failure secondary to COVID-19 infection and electrolytes which have been replaced.   Further hospital course and management as outlined below.   Patient medically stable for discharge to SNF pending insurance authorization, having completed required 10 days isolation for covid.    Assessment and Plan: Acute metabolic encephalopathy: resolved In the setting of baseline cognitive impairment.  Mentation now around patient's baseline per s/o -- he reports her confusion waxes and wanes. CT head is negative for acute intracranial abnormality.  MRI brain showing No acute intracranial abnormality. Old left cerebellar infarct. Early confluent white matter T2 hyperintensity consistent with chronic small vessel disease.Generalized cerebral volume loss. EEG did not show any seizures.  --Suspect from UTI --Finished course of IV ceftriaxone .  -staff reports pt has been agitated during the day on a daily basis. Currently on at bedtime seroquel . Will add QAM dose as well     Acute hypoxic respiratory failure secondary to COVID  Positive for COVID-19 infection 10/15--16 sats were 96-100% on 1-3 L O2 10/17 -- weaned to room air, sats 99% -stopped corticosteroids on 10/9 -bronchodilator as needed     Left foot and ankle pain and some swelling on the dorsum of the foot X rays of the left foot showed evidence of soft tissue swelling without any acute fractures. --PT --Tylenol  PRN   Hypokalemia  and hypomagnesemia K 2.9 replaced. Mg 1.6 replaced. PO intake has been poor.  No GI losses. --Will d/c on daily K and Mg --Repeat labs in follow up, replace as needed   Hospital acquired delirium superimposed on baseline cognitive impairment --Frequent reorientation --Started on seroquel  25 mg at bedtime on 10/9, increased to 50 mg - continue at bedtime. History of insomnia --Stopped trazodone for seroquel  as above --PCP follow up   H/o CVA  --Continue with PT and OT --On statin, not on antiplatelet (defer to PCP)   GERD --Continue PPI   Hyperlipidemia --On statin    Controlled type 2 DM  A1c is 6.9% --Resume metformin  and Tresiba   --PCP follow up --Monitor sugars   Obesity Body mass index is 33.1 kg/m. Complicates overall care and prognosis.  Recommend lifestyle modifications including physical activity and diet for weight loss and overall long-term health.       Subjective: Pleasantly confused today  Physical Exam: Vitals:   11/01/23 0753 11/01/23 0808 11/01/23 1042 11/01/23 1605  BP: 135/61  119/82 (!) 117/58  Pulse: 81  86 97  Resp: 20  18 18   Temp: 97.9 F (36.6 C)  (!) 97.1 F (36.2 C) 98 F (36.7 C)  TempSrc: Oral     SpO2: 95% 92% 98% 95%  Weight:      Height:       General exam: Awake, laying in bed, in nad Respiratory system: Normal respiratory effort, no wheezing Cardiovascular system: regular rate, s1, s2 Gastrointestinal system: Soft, nondistended, positive BS Central nervous system: CN2-12 grossly intact, strength intact Extremities: Perfused, no clubbing Skin: Normal skin turgor, no notable skin lesions seen Psychiatry: Difficult to assess given  mentation  Data Reviewed:  Labs reviewed: Na 141, K 3.2, Cr 0.56  Family Communication: Pt in room, family not at bedside  Disposition: Status is: Inpatient Remains inpatient appropriate because: severity of illness  Planned Discharge Destination: Skilled nursing  facility     Author: Garnette Pelt, MD 11/01/2023 4:53 PM  For on call review [email or web address].

## 2023-11-01 NOTE — Plan of Care (Signed)
  Problem: Education: Goal: Ability to describe self-care measures that may prevent or decrease complications (Diabetes Survival Skills Education) will improve Outcome: Not Progressing Goal: Individualized Educational Video(s) Outcome: Not Progressing   Problem: Coping: Goal: Ability to adjust to condition or change in health will improve Outcome: Not Progressing   Problem: Fluid Volume: Goal: Ability to maintain a balanced intake and output will improve Outcome: Not Progressing   Problem: Health Behavior/Discharge Planning: Goal: Ability to identify and utilize available resources and services will improve Outcome: Not Progressing Goal: Ability to manage health-related needs will improve Outcome: Not Progressing   Problem: Metabolic: Goal: Ability to maintain appropriate glucose levels will improve Outcome: Not Progressing   Problem: Nutritional: Goal: Maintenance of adequate nutrition will improve Outcome: Not Progressing Goal: Progress toward achieving an optimal weight will improve Outcome: Not Progressing

## 2023-11-01 NOTE — Progress Notes (Signed)
   11/01/23 0905  Mobility  Activity Stood at bedside  Level of Assistance Moderate assist, patient does 50-74%  Assistive Device Other (Comment) (HHA)  Distance Ambulated (ft) 2 ft  Activity Response Tolerated fair  Mobility Referral Yes  Mobility visit 1 Mobility  Mobility Specialist Start Time (ACUTE ONLY) [id number]  Mobility Specialist Stop Time (ACUTE ONLY) [id number]  Mobility Specialist Time Calculation (min) (ACUTE ONLY) 17 min   Mobility Specialist: Progress Note  Pt agreeable to mobility session - received in bed. Pt was asymptomatic throughout session with no complaints. Returned to chair position in bed with all needs met - call bell within reach. Bed alarm on.   Additional comments: Pt denied pain and dizziness.  Pt took side steps towards HOB. Required verbal and tactile cues to engage a step.   Allison Hughes, BS Mobility Specialist Please contact via SecureChat or  Rehab office at [phone].

## 2023-11-02 DIAGNOSIS — Z794 Long term (current) use of insulin: Secondary | ICD-10-CM | POA: Diagnosis not present

## 2023-11-02 DIAGNOSIS — E1165 Type 2 diabetes mellitus with hyperglycemia: Secondary | ICD-10-CM | POA: Diagnosis not present

## 2023-11-02 DIAGNOSIS — G9341 Metabolic encephalopathy: Secondary | ICD-10-CM | POA: Diagnosis not present

## 2023-11-02 DIAGNOSIS — R4182 Altered mental status, unspecified: Secondary | ICD-10-CM | POA: Diagnosis not present

## 2023-11-02 LAB — COMPREHENSIVE METABOLIC PANEL WITH GFR
ALT: 30 U/L (ref 0–44)
AST: 29 U/L (ref 15–41)
Albumin: 2.8 g/dL — ABNORMAL LOW (ref 3.5–5.0)
Alkaline Phosphatase: 106 U/L (ref 38–126)
Anion gap: 10 (ref 5–15)
BUN: 8 mg/dL (ref 8–23)
CO2: 22 mmol/L (ref 22–32)
Calcium: 9.1 mg/dL (ref 8.9–10.3)
Chloride: 110 mmol/L (ref 98–111)
Creatinine, Ser: 0.5 mg/dL (ref 0.44–1.00)
GFR, Estimated: >60 mL/min (ref >60)
Glucose, Bld: 117 mg/dL — ABNORMAL HIGH (ref 70–99)
Potassium: 3.7 mmol/L (ref 3.5–5.1)
Sodium: 142 mmol/L (ref 135–145)
Total Bilirubin: 0.6 mg/dL (ref 0.0–1.2)
Total Protein: 5.9 g/dL — ABNORMAL LOW (ref 6.5–8.1)

## 2023-11-02 LAB — MAGNESIUM: Magnesium: 1.6 mg/dL — ABNORMAL LOW (ref 1.7–2.4)

## 2023-11-02 LAB — GLUCOSE, CAPILLARY
Glucose-Capillary: 127 mg/dL — ABNORMAL HIGH (ref 70–99)
Glucose-Capillary: 113 mg/dL — ABNORMAL HIGH (ref 70–99)
Glucose-Capillary: 124 mg/dL — ABNORMAL HIGH (ref 70–99)

## 2023-11-02 MED ORDER — POTASSIUM CHLORIDE CRYS ER 20 MEQ PO TBCR
40.0000 meq | EXTENDED_RELEASE_TABLET | Freq: Once | ORAL | Status: AC
Start: 2023-11-02 — End: 2023-11-02
  Administered 2023-11-02: 40 meq via ORAL
  Filled 2023-11-02: qty 2

## 2023-11-02 MED ORDER — HALOPERIDOL LACTATE 5 MG/ML IJ SOLN
5.0000 mg | Freq: Once | INTRAMUSCULAR | Status: AC
  Administered 2023-11-02: 5 mg via INTRAMUSCULAR

## 2023-11-02 MED ORDER — LORAZEPAM 2 MG/ML IJ SOLN
1.0000 mg | Freq: Four times a day (QID) | INTRAMUSCULAR | Status: DC | PRN
  Administered 2023-11-07 (×2): 1 mg via INTRAMUSCULAR
  Filled 2023-11-02 (×2): qty 1

## 2023-11-02 MED ORDER — MAGNESIUM SULFATE 4 GM/100ML IV SOLN
4.0000 g | Freq: Once | INTRAVENOUS | Status: AC
  Administered 2023-11-02: 4 g via INTRAVENOUS
  Filled 2023-11-02: qty 100

## 2023-11-02 MED ORDER — HALOPERIDOL LACTATE 5 MG/ML IJ SOLN
5.0000 mg | Freq: Four times a day (QID) | INTRAMUSCULAR | Status: DC | PRN
  Administered 2023-11-06 – 2023-11-08 (×5): 5 mg via INTRAVENOUS
  Filled 2023-11-02 (×6): qty 1

## 2023-11-02 NOTE — Progress Notes (Signed)
 Progress Note   Patient: Allison Hughes FMW:[id number] DOB: 10/23/1950 DOA: 10/20/2023     12 DOS: the patient was seen and examined on 11/02/2023   Brief hospital course: 73 y.o. female with medical history significant of hypertension, hyperlipidemia, CVA, COPD, diabetes mellitus type 2, GI bleed, neuroendocrine ulcer, and obesity who presents after being found on the floor confused she is being treated for acute metabolic encephalopathy in the setting of urinary tract infection, acute hypoxic respiratory failure secondary to COVID-19 infection and electrolytes which have been replaced.   Further hospital course and management as outlined below.   Patient medically stable for discharge to SNF pending insurance authorization, having completed required 10 days isolation for covid.    Assessment and Plan: Acute metabolic encephalopathy: resolved In the setting of baseline cognitive impairment.  Mentation now around patient's baseline per s/o -- he reports her confusion waxes and wanes. CT head is negative for acute intracranial abnormality.  MRI brain showing No acute intracranial abnormality. Old left cerebellar infarct. Early confluent white matter T2 hyperintensity consistent with chronic small vessel disease.Generalized cerebral volume loss. EEG did not show any seizures.  --Suspect from UTI --Finished course of IV ceftriaxone .  -staff reported pt has been agitated during the day on a daily basis. Continue bedtime seroquel  with added QAM dose as well -very agitated this AM. Increased PRN haldol  to 5mg  q6h PRN -Restraints as needed for pt safety     Acute hypoxic respiratory failure secondary to COVID  Positive for COVID-19 infection 10/15--16 sats were 96-100% on 1-3 L O2 10/17 -- weaned to room air, sats 99% -stopped corticosteroids on 10/9 -bronchodilator as needed     Left foot and ankle pain and some swelling on the dorsum of the foot X rays of the left foot showed evidence  of soft tissue swelling without any acute fractures. --PT --Tylenol  PRN   Hypokalemia and hypomagnesemia K 2.9 replaced. Mg 1.6 replaced. PO intake has been poor.  No GI losses. --Will d/c on daily K and Mg --Repeat labs in follow up, replace as needed   Hospital acquired delirium superimposed on baseline cognitive impairment --Frequent reorientation --Started on seroquel  25 mg at bedtime on 10/9, increased to 50 mg - continue at bedtime. History of insomnia --Stopped trazodone for seroquel  as above --PCP follow up   H/o CVA  --Continue with PT and OT --On statin, not on antiplatelet (defer to PCP)   GERD --Continue PPI   Hyperlipidemia --On statin    Controlled type 2 DM  A1c is 6.9% --Resume metformin  and Tresiba   --PCP follow up --Monitor sugars   Obesity Body mass index is 33.1 kg/m. Complicates overall care and prognosis.  Recommend lifestyle modifications including physical activity and diet for weight loss and overall long-term health.       Subjective: More agitated this afternoon  Physical Exam: Vitals:   11/02/23 0700 11/02/23 0822 11/02/23 0846 11/02/23 1620  BP:   (!) 168/81 (!) 144/63  Pulse:   79 100  Resp:   17 16  Temp:   97.7 F (36.5 C) 98.2 F (36.8 C)  TempSrc:   Oral Oral  SpO2:  96% 97% 96%  Weight: 67.7 kg     Height:       General exam: Conversant, in no acute distress Respiratory system: normal chest rise, clear, no audible wheezing Cardiovascular system: regular rhythm, s1-s2 Gastrointestinal system: Nondistended, nontender, pos BS Central nervous system: No seizures, no tremors Extremities: No cyanosis, no joint  deformities Skin: No rashes, no pallor Psychiatry: Affect normal // no auditory hallucinations   Data Reviewed:  Labs reviewed: Na 142, K 3.7, Cr 0.50, Mg 1.6  Family Communication: Pt in room, family at bedside  Disposition: Status is: Inpatient Remains inpatient appropriate because: severity of illness   Planned Discharge Destination: Skilled nursing facility     Author: Garnette Pelt, MD 11/02/2023 5:18 PM  For on call review [email or web address].

## 2023-11-02 NOTE — Progress Notes (Signed)
   11/02/23 1108  Mobility  Activity Stood at bedside  Level of Assistance Minimal assist, patient does 75% or more  Assistive Device Other (Comment) (HHA)  Distance Ambulated (ft) 2 ft  Activity Response Tolerated fair  Mobility Referral Yes  Mobility visit 1 Mobility  Mobility Specialist Start Time (ACUTE ONLY) 1108  Mobility Specialist Stop Time (ACUTE ONLY) 1128  Mobility Specialist Time Calculation (min) (ACUTE ONLY) 20 min   Mobility Specialist: Progress Note  Pt agreeable to mobility session - received in bed. Pt was asymptomatic throughout session with no complaints. Returned to bed in chair position with all needs met - call bell within reach. Bed alarm on.   Additional comments: Pt side stepped to [facility].   Virgle Boards, BS Mobility Specialist Please contact via SecureChat or  Rehab office at [phone].

## 2023-11-02 NOTE — TOC Progression Note (Addendum)
 Transition of Care [facility]) - Progression Note    Patient Details  Name: MAKENZYE TROUTMAN MRN: [id number] Date of Birth: 1950/07/19  Transition of Care [facility] CM/SW Contact  Gwenn Frieze [place], [place] Phone Number: 11/02/2023, 11:13 AM  Clinical Narrative: Confirmed with Shona at [facility] they are no longer able to offer a bed. Provided pt's SO Manus with current SNF offers and he has accepted [facility]. Confirmed bed with Whitney at [facility]. [facility] auth request pending, reference [id number].   Frieze Gwenn, MSW, LCSW [phone] (coverage)       Expected Discharge Plan: Skilled Nursing Facility Barriers to Discharge: Continued Medical Work up, SNF Pending bed offer               Expected Discharge Plan and Services                                               Social Drivers of Health (SDOH) Interventions SDOH Screenings   Food Insecurity: Patient Unable To Answer (10/20/2023)  Housing: Unknown (10/20/2023)  Transportation Needs: Patient Unable To Answer (10/20/2023)  Utilities: Patient Unable To Answer (10/20/2023)  Depression (PHQ2-9): Low Risk  (10/26/2020)  Social Connections: Patient Unable To Answer (10/20/2023)  Tobacco Use: High Risk (10/20/2023)    Readmission Risk Interventions    10/24/2023    2:14 PM  Readmission Risk Prevention Plan  Transportation Screening Complete  PCP or Specialist Appt within 3-5 Days Complete  HRI or Home Care Consult Complete  Social Work Consult for Recovery Care Planning/Counseling Complete  Palliative Care Screening Complete  Medication Review Oceanographer) Referral to Pharmacy

## 2023-11-02 NOTE — Plan of Care (Signed)

## 2023-11-03 DIAGNOSIS — R4182 Altered mental status, unspecified: Secondary | ICD-10-CM | POA: Diagnosis not present

## 2023-11-03 DIAGNOSIS — E1165 Type 2 diabetes mellitus with hyperglycemia: Secondary | ICD-10-CM | POA: Diagnosis not present

## 2023-11-03 DIAGNOSIS — G9341 Metabolic encephalopathy: Secondary | ICD-10-CM | POA: Diagnosis not present

## 2023-11-03 DIAGNOSIS — Z794 Long term (current) use of insulin: Secondary | ICD-10-CM | POA: Diagnosis not present

## 2023-11-03 LAB — GLUCOSE, CAPILLARY
Glucose-Capillary: 132 mg/dL — ABNORMAL HIGH (ref 70–99)
Glucose-Capillary: 136 mg/dL — ABNORMAL HIGH (ref 70–99)
Glucose-Capillary: 167 mg/dL — ABNORMAL HIGH (ref 70–99)
Glucose-Capillary: 186 mg/dL — ABNORMAL HIGH (ref 70–99)

## 2023-11-03 MED ORDER — LACTATED RINGERS IV SOLN: INTRAVENOUS | Status: AC

## 2023-11-03 NOTE — Progress Notes (Signed)
 Progress Note   Patient: Allison Hughes FMW:[id number] DOB: [birth year] DOA: 10/20/2023     13 DOS: the patient was seen and examined on 11/03/2023   Brief hospital course: 73 y.o. female with medical history significant of hypertension, hyperlipidemia, CVA, COPD, diabetes mellitus type 2, GI bleed, neuroendocrine ulcer, and obesity who presents after being found on the floor confused she is being treated for acute metabolic encephalopathy in the setting of urinary tract infection, acute hypoxic respiratory failure secondary to COVID-19 infection and electrolytes which have been replaced.   Further hospital course and management as outlined below.   Patient medically stable for discharge to SNF pending insurance authorization, having completed required 10 days isolation for covid.    Assessment and Plan: Acute metabolic encephalopathy: resolved In the setting of baseline cognitive impairment.  Mentation now around patient's baseline per s/o -- he reports her confusion waxes and wanes. CT head is negative for acute intracranial abnormality.  MRI brain showing No acute intracranial abnormality. Old left cerebellar infarct. Early confluent white matter T2 hyperintensity consistent with chronic small vessel disease.Generalized cerebral volume loss. EEG did not show any seizures.  --Suspect made worse from UTI --Finished course of IV ceftriaxone .  -staff reported pt has been agitated during the day on a daily basis. Continue bedtime seroquel  with added QAM dose as well -Continue  PRN haldol  to 5mg  q6h PRN -Restraints as needed for pt safety -appears dehydrated today. Will provide IVF     Acute hypoxic respiratory failure secondary to COVID  Positive for COVID-19 infection 10/15--16 sats were 96-100% on 1-3 L O2 10/17 -- weaned to room air, sats 99% -stopped corticosteroids on 10/9 -bronchodilator as needed     Left foot and ankle pain and some swelling on the dorsum of the foot X rays  of the left foot showed evidence of soft tissue swelling without any acute fractures. --PT --Tylenol  PRN   Hypokalemia and hypomagnesemia K 2.9 replaced. Mg 1.6 replaced. PO intake has been poor.  No GI losses. --Will d/c on daily K and Mg --Repeat labs in follow up, replace as needed   Hospital acquired delirium superimposed on baseline cognitive impairment --Frequent reorientation --Started on seroquel  25 mg at bedtime on 10/9, increased to 50 mg - continue at bedtime. History of insomnia --Stopped trazodone for seroquel  as above --PCP follow up   H/o CVA  --Continue with PT and OT --On statin, not on antiplatelet (defer to PCP)   GERD --Continue PPI   Hyperlipidemia --On statin    Controlled type 2 DM  A1c is 6.9% --Resume metformin  and Tresiba   --PCP follow up --Monitor sugars   Obesity Body mass index is 33.1 kg/m. Complicates overall care and prognosis.  Recommend lifestyle modifications including physical activity and diet for weight loss and overall long-term health.       Subjective: Confused this AM  Physical Exam: Vitals:   11/03/23 1034 11/03/23 1255 11/03/23 1257 11/03/23 1525  BP:   120/63 (!) 101/57  Pulse:   100 90  Resp:   16 16  Temp: 99.9 F (37.7 C)  98.8 F (37.1 C) 98.4 F (36.9 C)  TempSrc: Oral  Oral Oral  SpO2:  (!) 88% 98% 93%  Weight:      Height:       General exam: Awake, laying in bed, in nad, membranes and lips appear dry Respiratory system: Normal respiratory effort, no wheezing Cardiovascular system: regular rate, s1, s2 Gastrointestinal system: Soft, nondistended, positive BS Central  nervous system: CN2-12 grossly intact, strength intact Extremities: Perfused, no clubbing Skin: Normal skin turgor, no notable skin lesions seen Psychiatry: Unable to assess given mentation  Data Reviewed:  There are no new results to review at this time.  Family Communication: Pt in room, family at bedside  Disposition: Status  is: Inpatient Remains inpatient appropriate because: severity of illness  Planned Discharge Destination: Skilled nursing facility     Author: Garnette Pelt, MD 11/03/2023 3:40 PM  For on call review [email or web address].

## 2023-11-03 NOTE — Progress Notes (Signed)
 Patient room thermostat found at 65 degrees. Patient shivering in bed. Warm blankets placed on patient, however patient running low-grade temp of 99.8 orally, so blankets were removed. Room thermostat set to 72 degrees.   Attempted to administer patient morning medications at 0900, however patient is drowsy. Received haldol  prn 10/19, late bedtime seroquel  administration due to initial refusal per night shift, patient later agreeable. Patient is not alert enough to take medications orally at this time, tylenol  rectal administered for temp, insulin  and lovenox  administered. Will attempt remainder of morning medications later when patient more alert.

## 2023-11-03 NOTE — Progress Notes (Signed)
 Physical Therapy Treatment Patient Details Name: Allison Hughes MRN: [id number] DOB: 02-Mar-1950 Today's Date: 11/03/2023   History of Present Illness 73 yo female found down on floor 10/6. Recent hospitalization 9/5-9/10 found on floor hypoxic with PNA and UTI d/c to [facility].  Currently pt with confusion, paranoid thoughts and forgetfulness. Found to have COVID and acute metabolic encephalopathy. PMH HTN HLD CVA COPD DM2 GIB, Obesity    PT Comments  Pt supine in bed on entry, had just refused to take medication with RN. Pt does have residual medication in her mouth and is able to swallow with sips of water. Pt agreeable to getting up with PT and Mobility Specialist. Pt is modAx2 for getting to EoB. Pt initiates gait towards the door with modAx2 but reduces from step through gait to step to gait eventually stops. Pt asks if pt remembers what she is doing, and pt replies she forgets. Pt able to take a few more steps towards door, before needing to sit in straight back chair. Pt slid back to bed in chair and requires total Ax2 for coming to standing and pivoting back to bed. Pt with BP 63/51 HR 102. Pt less responsive and pale, able to follow commands on R side but not on L BP rebounded to 95/61 HR 87. SpO2 >90%O2 on RA. Pt continued to have reduced attention and movement on L side. RN notified and eventually pt able to move L side to command. Pt visibly exhausted. RN still in room when PT and MS left.    If plan is discharge home, recommend the following: A lot of help with walking and/or transfers;A lot of help with bathing/dressing/bathroom;Assistance with cooking/housework;Direct supervision/assist for medications management;Direct supervision/assist for financial management;Assist for transportation;Assistance with feeding;Help with stairs or ramp for entrance;Supervision due to cognitive status   Can travel by private vehicle     Yes  Equipment Recommendations  Wheelchair  (measurements PT);Wheelchair cushion (measurements PT)       Precautions / Restrictions Precautions Precautions: Fall Recall of Precautions/Restrictions: Impaired Precaution/Restrictions Comments: AMS,  watch SpO2 Restrictions Weight Bearing Restrictions Per Provider Order: No     Mobility  Bed Mobility Overal bed mobility: Needs Assistance Bed Mobility: Supine to Sit, Sit to Supine     Supine to sit: HOB elevated, Used rails, Mod assist, +2 for physical assistance Sit to supine: Total assist, +2 for physical assistance   General bed mobility comments: pt able to reach for bed rail and assist in bringing herself to upright, requires total Ax2 for returning to supine    Transfers Overall transfer level: Needs assistance Equipment used: 2 person hand held assist Transfers: Sit to/from Stand Sit to Stand: Mod assist, +2 physical assistance Stand pivot transfers: Mod assist, Total assist, +2 physical assistance         General transfer comment: pt is modAx2 for power up and steadying in standing, pt requires total Ax2 for stand pivot from chair back to bed    Ambulation/Gait Ambulation/Gait assistance: Mod assist, +2 physical assistance, Max assist Gait Distance (Feet): 10 Feet Assistive device: 2 person hand held assist Gait Pattern/deviations: Step-through pattern, Step-to pattern, Decreased step length - right, Decreased step length - left, Shuffle, Trunk flexed Gait velocity: reduced Gait velocity interpretation: <1.31 ft/sec, indicative of household ambulator   General Gait Details: pt requires modAx2 progressing to maxAx2 for ambulation to door, pt requiring constant cuing for sequencing stepping, after about 5 feet PT asked pt if she remembered what she  was doing and she responded that she did not know. Pt continued but with increasing assist needed. Pt ultimately needed to sit in straight back chair and slid back to bed, pt with decreased response to questions          Balance Overall balance assessment: Needs assistance Sitting-balance support: Feet supported, No upper extremity supported Sitting balance-Leahy Scale: Fair Sitting balance - Comments: able to static sit but given CGA for safety   Standing balance support: Bilateral upper extremity supported Standing balance-Leahy Scale: Zero                              Communication Communication Communication: Impaired (per prior notes) Factors Affecting Communication: Hearing impaired  Cognition Arousal: Alert Behavior During Therapy: Flat affect   PT - Cognitive impairments: No family/caregiver present to determine baseline, History of cognitive impairments, Orientation, Attention, Sequencing, Awareness   Orientation impairments: Place, Time, Situation                   PT - Cognition Comments: responds to name, agreeable to get up after not being agreeable to take her medication Following commands: Impaired Following commands impaired: Follows one step commands inconsistently, Follows one step commands with increased time    Cueing Cueing Techniques: Verbal cues, Gestural cues, Tactile cues, Visual cues     General Comments General comments (skin integrity, edema, etc.): pt very pale with return to bed, does not respond to commands initially and then is able to perform tasks on R and not L with decreased attention to L side, BP 63/51 initially in supine rebounding to 95/62. O2 >90%O2 on RA RN notified with constant simulation pt eventually attends to R side and moves to command      Pertinent Vitals/Pain Pain Assessment Pain Assessment: No/denies pain Faces Pain Scale: Hurts a little bit Pain Location: generalized Pain Descriptors / Indicators: Grimacing, Moaning Pain Intervention(s): Limited activity within patient's tolerance, Monitored during session, Repositioned     PT Goals (current goals can now be found in the care plan section) Acute Rehab PT  Goals PT Goal Formulation: With patient Time For Goal Achievement: 11/04/23 Potential to Achieve Goals: Fair Progress towards PT goals: Not progressing toward goals - comment    Frequency    Min 2X/week       AM-PAC PT 6 Clicks Mobility   Outcome Measure  Help needed turning from your back to your side while in a flat bed without using bedrails?: A Little Help needed moving from lying on your back to sitting on the side of a flat bed without using bedrails?: A Little Help needed moving to and from a bed to a chair (including a wheelchair)?: A Lot Help needed standing up from a chair using your arms (e.g., wheelchair or bedside chair)?: Total Help needed to walk in hospital room?: Total Help needed climbing 3-5 steps with a railing? : Total 6 Click Score: 11    End of Session Equipment Utilized During Treatment: Gait belt Activity Tolerance: Patient tolerated treatment well;Treatment limited secondary to medical complications (Comment) Patient left: in bed;with call bell/phone within reach;with bed alarm set;with nursing/sitter in room Nurse Communication: Mobility status PT Visit Diagnosis: Muscle weakness (generalized) (M62.81);Repeated falls (R29.6);History of falling (Z91.81);Difficulty in walking, not elsewhere classified (R26.2);Pain;Unsteadiness on feet (R26.81);Other abnormalities of gait and mobility (R26.89) Pain - Right/Left: Left Pain - part of body: Ankle and joints of foot  Time: 9041-8966 PT Time Calculation (min) (ACUTE ONLY): 35 min  Charges:    $Gait Training: 8-22 mins $Therapeutic Activity: 8-22 mins PT General Charges $$ ACUTE PT VISIT: 1 Visit                     Jaclynn Laumann B. Fleeta Lapidus PT, DPT Acute Rehabilitation Services Please use secure chat or  Call Office [phone]    Almarie KATHEE Fleeta Fleet 11/03/2023, 1:23 PM

## 2023-11-03 NOTE — Plan of Care (Signed)
 Pt initially more irritable but has been much redirectable most of the night. Pt also refused to take all of her oral nightly meds at scheduled time but later with elevated BP, some reassurance and education provided, Patient agreeable and took her Seroquel  and lisinopril . Recheck BP reassuring.   Problem: Education: Goal: Ability to describe self-care measures that may prevent or decrease complications (Diabetes Survival Skills Education) will improve Outcome: Progressing   Problem: Coping: Goal: Ability to adjust to condition or change in health will improve Outcome: Progressing   Problem: Fluid Volume: Goal: Ability to maintain a balanced intake and output will improve Outcome: Progressing   Problem: Clinical Measurements: Goal: Will remain free from infection Outcome: Progressing   Problem: Pain Managment: Goal: General experience of comfort will improve and/or be controlled Outcome: Progressing   Problem: Safety: Goal: Ability to remain free from injury will improve Outcome: Progressing   Problem: Skin Integrity: Goal: Risk for impaired skin integrity will decrease Outcome: Progressing

## 2023-11-04 DIAGNOSIS — Z794 Long term (current) use of insulin: Secondary | ICD-10-CM | POA: Diagnosis not present

## 2023-11-04 DIAGNOSIS — G9341 Metabolic encephalopathy: Secondary | ICD-10-CM | POA: Diagnosis not present

## 2023-11-04 DIAGNOSIS — E1165 Type 2 diabetes mellitus with hyperglycemia: Secondary | ICD-10-CM | POA: Diagnosis not present

## 2023-11-04 DIAGNOSIS — R4182 Altered mental status, unspecified: Secondary | ICD-10-CM | POA: Diagnosis not present

## 2023-11-04 LAB — GLUCOSE, CAPILLARY
Glucose-Capillary: 137 mg/dL — ABNORMAL HIGH (ref 70–99)
Glucose-Capillary: 206 mg/dL — ABNORMAL HIGH (ref 70–99)
Glucose-Capillary: 140 mg/dL — ABNORMAL HIGH (ref 70–99)
Glucose-Capillary: 135 mg/dL — ABNORMAL HIGH (ref 70–99)

## 2023-11-04 MED ORDER — QUETIAPINE FUMARATE 25 MG PO TABS
50.0000 mg | ORAL_TABLET | Freq: Every morning | ORAL | Status: DC
  Administered 2023-11-05 – 2023-11-11 (×7): 50 mg via ORAL
  Filled 2023-11-04 (×7): qty 2

## 2023-11-04 MED ORDER — QUETIAPINE FUMARATE 25 MG PO TABS
25.0000 mg | ORAL_TABLET | Freq: Once | ORAL | Status: AC
  Administered 2023-11-04: 25 mg via ORAL
  Filled 2023-11-04: qty 1

## 2023-11-04 NOTE — Progress Notes (Signed)
 Mobility Specialist: Progress Note   11/04/23 1104  Therapy Vitals  Temp 97.8 F (36.6 C)  Pulse Rate 94  Resp 18  BP 132/63  Patient Position (if appropriate) Lying  Oxygen Therapy  SpO2 90 %  O2 Device Nasal Cannula  Mobility  Activity Ambulated with assistance  Level of Assistance Minimal assist, patient does 75% or more  Assistive Device Other (Comment) (HHA + IV pole)  Distance Ambulated (ft) 20 ft  Activity Response Tolerated well  Mobility Referral Yes  Mobility visit 1 Mobility  Mobility Specialist Start Time (ACUTE ONLY) 0920  Mobility Specialist Stop Time (ACUTE ONLY) 0949  Mobility Specialist Time Calculation (min) (ACUTE ONLY) 29 min    Pt received in bed, pleasant confused and agreeable to mobility session. ModA for bed mobility to initiate movements and assist with scooting and trunk elevation. MinA for STS. MinA for ambulation for physical assist with HHA and IV pole. Mod verbal and tactile cues for initiating movements and proper hand placement. Easily distracted this session requiring max verbal cues for redirection. Returned to bed. Left in bed with all needs met, call bell in reach. Bed alarm on and tele sitter in room.   [name] Mobility Specialist Please contact via SecureChat or Rehab office at [phone]

## 2023-11-04 NOTE — Plan of Care (Signed)

## 2023-11-04 NOTE — Progress Notes (Signed)
 Progress Note   Patient: Allison Hughes FMW:[id number] DOB: February 01, 1950 DOA: 10/20/2023     14 DOS: the patient was seen and examined on 11/04/2023   Brief hospital course: 73 y.o. female with medical history significant of hypertension, hyperlipidemia, CVA, COPD, diabetes mellitus type 2, GI bleed, neuroendocrine ulcer, and obesity who presents after being found on the floor confused she is being treated for acute metabolic encephalopathy in the setting of urinary tract infection, acute hypoxic respiratory failure secondary to COVID-19 infection and electrolytes which have been replaced.   Further hospital course and management as outlined below.   Patient medically stable for discharge to SNF pending insurance authorization, having completed required 10 days isolation for covid.    Assessment and Plan: Acute metabolic encephalopathy:  In the setting of baseline cognitive impairment.  Mentation now around patient's baseline per s/o -- he reports her confusion waxes and wanes. CT head is negative for acute intracranial abnormality.  MRI brain showing No acute intracranial abnormality. Old left cerebellar infarct. Early confluent white matter T2 hyperintensity consistent with chronic small vessel disease.Generalized cerebral volume loss. EEG did not show any seizures.  --Suspect made worse from UTI --Finished course of IV ceftriaxone .  -staff reported pt has been agitated during the day on a daily basis. Continue bedtime seroquel  with added QAM dose as well. Increase AM dose to 50mg . Cont 50mg  at bedtime dose -Continue  PRN haldol  to 5mg  q6h PRN -Restraints as needed for pt safety -given IVF for dehydration     Acute hypoxic respiratory failure secondary to COVID  Positive for COVID-19 infection 10/15--16 sats were 96-100% on 1-3 L O2 10/17 -- weaned to room air, sats 99% -stopped corticosteroids on 10/9 -bronchodilator as needed     Left foot and ankle pain and some swelling on  the dorsum of the foot X rays of the left foot showed evidence of soft tissue swelling without any acute fractures. --PT --Tylenol  PRN   Hypokalemia and hypomagnesemia K 2.9 replaced. Mg 1.6 replaced. PO intake has been poor.  No GI losses. --Will d/c on daily K and Mg --Repeat labs in follow up, replace as needed   Hospital acquired delirium superimposed on baseline cognitive impairment --Frequent reorientation --Started on seroquel  25 mg at bedtime on 10/9, increased to 50 mg - continue at bedtime. History of insomnia --Stopped trazodone for seroquel  as above --PCP follow up   H/o CVA  --Continue with PT and OT --On statin, not on antiplatelet (defer to PCP)   GERD --Continue PPI   Hyperlipidemia --On statin    Controlled type 2 DM  A1c is 6.9% --Resume metformin  and Tresiba   --PCP follow up --Monitor sugars   Obesity Body mass index is 33.1 kg/m. Complicates overall care and prognosis.  Recommend lifestyle modifications including physical activity and diet for weight loss and overall long-term health.       Subjective: Pleasantly confused this AM  Physical Exam: Vitals:   11/04/23 0745 11/04/23 0801 11/04/23 1104 11/04/23 1511  BP: (!) 118/55  132/63 (!) 129/59  Pulse: 87 87 94 (!) 103  Resp: 18 18 18 20   Temp:   97.8 F (36.6 C) (!) 97 F (36.1 C)  TempSrc:      SpO2: 98% 98% 90% 92%  Weight:      Height:       General exam: Conversant, in no acute distress Respiratory system: normal chest rise, clear, no audible wheezing Cardiovascular system: regular rhythm, s1-s2 Gastrointestinal system: Nondistended, nontender,  pos BS Central nervous system: No seizures, no tremors Extremities: No cyanosis, no joint deformities Skin: No rashes, no pallor Psychiatry: appears confused  Data Reviewed:  There are no new results to review at this time.  Family Communication: Pt in room, family at bedside  Disposition: Status is: Inpatient Remains inpatient  appropriate because: severity of illness  Planned Discharge Destination: Skilled nursing facility     Author: Garnette Pelt, MD 11/04/2023 3:18 PM  For on call review [email or web address].

## 2023-11-05 DIAGNOSIS — G9341 Metabolic encephalopathy: Secondary | ICD-10-CM | POA: Diagnosis not present

## 2023-11-05 LAB — CBC
HCT: 38.3 % (ref 36.0–46.0)
Hemoglobin: 11.4 g/dL — ABNORMAL LOW (ref 12.0–15.0)
MCH: 22.6 pg — ABNORMAL LOW (ref 26.0–34.0)
MCHC: 29.8 g/dL — ABNORMAL LOW (ref 30.0–36.0)
MCV: 76 fL — ABNORMAL LOW (ref 80.0–100.0)
Platelets: 283 K/uL (ref 150–400)
RBC: 5.04 MIL/uL (ref 3.87–5.11)
RDW: 22.9 % — ABNORMAL HIGH (ref 11.5–15.5)
WBC: 5.9 K/uL (ref 4.0–10.5)
nRBC: 0 % (ref 0.0–0.2)

## 2023-11-05 LAB — COMPREHENSIVE METABOLIC PANEL WITH GFR
ALT: 25 U/L (ref 0–44)
AST: 20 U/L (ref 15–41)
Albumin: 2.5 g/dL — ABNORMAL LOW (ref 3.5–5.0)
Alkaline Phosphatase: 129 U/L — ABNORMAL HIGH (ref 38–126)
Anion gap: 8 (ref 5–15)
BUN: 9 mg/dL (ref 8–23)
CO2: 25 mmol/L (ref 22–32)
Calcium: 9.2 mg/dL (ref 8.9–10.3)
Chloride: 108 mmol/L (ref 98–111)
Creatinine, Ser: 0.57 mg/dL (ref 0.44–1.00)
GFR, Estimated: >60 mL/min (ref >60)
Glucose, Bld: 116 mg/dL — ABNORMAL HIGH (ref 70–99)
Potassium: 3.4 mmol/L — ABNORMAL LOW (ref 3.5–5.1)
Sodium: 141 mmol/L (ref 135–145)
Total Bilirubin: 0.6 mg/dL (ref 0.0–1.2)
Total Protein: 5.9 g/dL — ABNORMAL LOW (ref 6.5–8.1)

## 2023-11-05 LAB — GLUCOSE, CAPILLARY
Glucose-Capillary: 131 mg/dL — ABNORMAL HIGH (ref 70–99)
Glucose-Capillary: 193 mg/dL — ABNORMAL HIGH (ref 70–99)
Glucose-Capillary: 167 mg/dL — ABNORMAL HIGH (ref 70–99)
Glucose-Capillary: 144 mg/dL — ABNORMAL HIGH (ref 70–99)
Glucose-Capillary: 210 mg/dL — ABNORMAL HIGH (ref 70–99)

## 2023-11-05 LAB — MAGNESIUM: Magnesium: 1.7 mg/dL (ref 1.7–2.4)

## 2023-11-05 MED ORDER — POTASSIUM CHLORIDE 20 MEQ PO PACK
40.0000 meq | PACK | Freq: Once | ORAL | Status: AC
  Administered 2023-11-05: 40 meq via ORAL
  Filled 2023-11-05: qty 2

## 2023-11-05 NOTE — Progress Notes (Signed)
 Nutrition Follow-up  DOCUMENTATION CODES:   Obesity unspecified  INTERVENTION:  Continue carb modified diet Document % PO in flowsheet Ensure Max po BID, each supplement provides 150 kcal and 30 grams of protein.  Monitor CBG, adjust insulin  regimen as needed    NUTRITION DIAGNOSIS:   Altered nutrition lab value related to chronic illness ((diabetes)) as evidenced by  (A1c 6.9, CBGs > 180). - ongoing    GOAL:   Patient will meet greater than or equal to 90% of their needs - progressing    MONITOR:   Labs, PO intake  REASON FOR ASSESSMENT:   Consult Assessment of nutrition requirement/status  ASSESSMENT:   Past medical history significant of hypertension, hyperlipidemia, CVA, COPD, diabetes mellitus type 2, GI bleed, neuroendocrine ulcer, and obesity who presents after being found on the floor confused.   Pt remains confused, only oriented to self. 50% consumed breakfast tray at bedside. RD opened ensure container on tray and gave to patient. 48% po intake x 6 meals documented in flowsheet. Pt stable for discharge, pending insurance auth for SNF.   Labs: K+ 3.4, Glu 116, CBG 131-206   Meds: B12,  SSI 0-9 units TID   NUTRITION - FOCUSED PHYSICAL EXAM:  Flowsheet Row Most Recent Value  Orbital Region No depletion  Upper Arm Region Mild depletion  Thoracic and Lumbar Region No depletion  Buccal Region No depletion  Temple Region Unable to assess  [EEG nodes]  Clavicle Bone Region No depletion  Clavicle and Acromion Bone Region No depletion  Scapular Bone Region No depletion  Dorsal Hand No depletion  Patellar Region No depletion  Anterior Thigh Region No depletion  Posterior Calf Region No depletion  Hair Unable to assess  [EEG nodes]  Eyes Reviewed  Mouth Reviewed  Skin Reviewed  Nails Reviewed    Diet Order:   Diet Order             Diet Carb Modified           Diet Carb Modified Fluid consistency: Thin; Room service appropriate? Yes  Diet  effective now                   EDUCATION NEEDS:   Not appropriate for education at this time  Skin:  Skin Assessment: Reviewed RN Assessment  Last BM:  10/21  Height:   Ht Readings from Last 1 Encounters:  10/20/23 5' 2 (1.575 m)    Weight:   Wt Readings from Last 1 Encounters:  11/04/23 68.5 kg    Ideal Body Weight:  50 kg  BMI:  Body mass index is 27.62 kg/m.  Estimated Nutritional Needs:   Kcal:  1250-1500 kcals  Protein:  50-60 gm  Fluid:  1.2-1.5 L/d  Madalen Gavin, MS, RD, LDN Clinical Dietitian  Please see [facility] for contact information.

## 2023-11-05 NOTE — Progress Notes (Signed)
 PROGRESS NOTE    Allison Hughes  FMW:[id number] DOB: 1950-07-07 DOA: 10/20/2023 PCP: Valma Carwin, MD   Brief Narrative:  This 73 yrs old female with PMH significant for hypertension, hyperlipidemia, CVA, COPD, diabetes mellitus type 2, GI bleed, neuroendocrine ulcer, and obesity who presents after being found on the floor confused.  She is being treated for acute metabolic encephalopathy in the setting of urinary tract infection, acute hypoxic respiratory failure secondary to COVID-19 infection and electrolytes which have been replaced. Further hospital course and management as outlined below.   Patient medically stable for discharge to SNF pending insurance authorization, having completed required 10 days isolation for covid.  Assessment & Plan:   Active Problems:   Chronic diastolic CHF (congestive heart failure) (HCC)   Fall at home, initial encounter   Essential hypertension   DM (diabetes mellitus), type 2 (HCC)   Hyperlipidemia   History of stroke   History of gastric ulcer   Acute Metabolic encephalopathy: > Improved. In the setting of baseline cognitive impairment.   Mentation now around baseline per Family. They reports her confusion waxes and wanes. CT head is negative for acute intracranial abnormality.  MRI brain showing No acute intracranial abnormality. Old left cerebellar infarct. EEG did not show any seizures.  Suspect made worse from UTI. She has finished the course of IV ceftriaxone .  -Staff reports pt has been agitated during the day on a daily basis.  -Continue bedtime seroquel  with added QAM dose as well. Increase AM dose to 50mg . Cont 50mg  at bedtime dose. -Continue  PRN haldol  to 5mg  q6h PRN -Restraints as needed for patient's safety -given IVF for dehydration     Acute hypoxic respiratory failure secondary to COVID: Positive for COVID-19 infection. Now weaned down to room air. -stopped corticosteroids on 10/23/23. -Continue bronchodilators as  needed.     Left foot and ankle pain and some swelling on the dorsum of the foot: X rays of the left foot showed evidence of soft tissue swelling without any acute fractures. PT/ OT Evaluation. Continue Tylenol  PRN.   Hypokalemia and hypomagnesemia Replaced.  Continue to monitor   Hospital acquired delirium superimposed on baseline cognitive impairment: Continue Frequent reorientation. Started on seroquel  25 mg at bedtime on 10/9, increased to 50 mg - continue at bedtime.  History of insomnia Stopped trazodone for seroquel  as above. PCP follow up   H/o CVA : Continue with PT and OT On statin, not on antiplatelet (defer to PCP)   GERD: Continue PPI   Hyperlipidemia: Continue statin    Controlled type 2 DM : Hb A1c is 6.9% --Resume metformin  and Tresiba . --PCP follow up. --Monitor sugars   Obesity: Body mass index is 33.1 kg/m. Complicates overall care and prognosis.   Recommend lifestyle modifications including physical activity and diet for weight loss and overall long-term health.     DVT prophylaxis: Lovenox  Code Status: Full code Family Communication: No family at bed side. Disposition Plan:    Status is: Inpatient Remains inpatient appropriate because: Severity of illness.    Consultants:  None  Procedures:  Antimicrobials:  Anti-infectives (From admission, onward)    Start     Dose/Rate Route Frequency Ordered Stop   10/21/23 1000  cefTRIAXone  (ROCEPHIN ) 1 g in sodium chloride  0.9 % 100 mL IVPB  Status:  Discontinued        1 g 200 mL/hr over 30 Minutes Intravenous Every 24 hours 10/21/23 0110 10/23/23 1015   10/20/23 1230  cefTRIAXone  (ROCEPHIN ) 1 g  in sodium chloride  0.9 % 100 mL IVPB        1 g 200 mL/hr over 30 Minutes Intravenous  Once 10/20/23 1220 10/20/23 1338      Subjective: Patient was seen and examined at bedside.  Overnight events noted. Patient was slightly agitated but otherwise following commands and responding  appropriately.  Objective: Vitals:   11/05/23 0358 11/05/23 0742 11/05/23 0755 11/05/23 1208  BP: 130/67 127/74  (!) 145/89  Pulse: 78 77 77 (!) 105  Resp: 18  17   Temp: 97.8 F (36.6 C) 98 F (36.7 C)  97.9 F (36.6 C)  TempSrc: Oral     SpO2: 94% 94%  96%  Weight:      Height:        Intake/Output Summary (Last 24 hours) at 11/05/2023 1344 Last data filed at 11/05/2023 0400 Gross per 24 hour  Intake 500 ml  Output 800 ml  Net -300 ml   Filed Weights   11/02/23 0700 11/03/23 0636 11/04/23 0500  Weight: 67.7 kg 67.9 kg 68.5 kg    Examination:  General exam: Appears calm and comfortable, not in any acute distress. Respiratory system: CTA Bilaterally. Respiratory effort normal. RR 17 Cardiovascular system: S1 & S2 heard, RRR. No JVD, murmurs, rubs, gallops or clicks.  Gastrointestinal system: Abdomen is non distended, soft and non tender.  Normal bowel sounds heard. Central nervous system: Alert and oriented x 2. No focal neurological deficits. Extremities: No edema, no cyanosis, no clubbing. Skin: No rashes, lesions or ulcers Psychiatry: Judgement and insight appear normal. Mood & affect appropriate.     Data Reviewed: I have personally reviewed following labs and imaging studies  CBC: Recent Labs  Lab 10/31/23 0721 11/05/23 0425  WBC 6.5 5.9  HGB 12.0 11.4*  HCT 39.7 38.3  MCV 73.9* 76.0*  PLT 352 283   Basic Metabolic Panel: Recent Labs  Lab 10/31/23 0721 11/01/23 1438 11/02/23 0632 11/05/23 0425  NA 140 141 142 141  K 2.9* 3.2* 3.7 3.4*  CL 104 108 110 108  CO2 24 25 22 25   GLUCOSE 125* 115* 117* 116*  BUN 8 9 8 9   CREATININE 0.46 0.56 0.50 0.57  CALCIUM  9.2 9.2 9.1 9.2  MG 1.6*  --  1.6* 1.7   GFR: Estimated Creatinine Clearance: 57.7 mL/min (by C-G formula based on SCr of 0.57 mg/dL). Liver Function Tests: Recent Labs  Lab 11/01/23 1438 11/02/23 0632 11/05/23 0425  AST 35 29 20  ALT 32 30 25  ALKPHOS 120 106 129*  BILITOT 0.4  0.6 0.6  PROT 4.7* 5.9* 5.9*  ALBUMIN 2.8* 2.8* 2.5*   No results for input(s): LIPASE, AMYLASE in the last 168 hours. No results for input(s): AMMONIA in the last 168 hours. Coagulation Profile: No results for input(s): INR, PROTIME in the last 168 hours. Cardiac Enzymes: No results for input(s): CKTOTAL, CKMB, CKMBINDEX, TROPONINI in the last 168 hours. BNP (last 3 results) No results for input(s): PROBNP in the last 8760 hours. HbA1C: No results for input(s): HGBA1C in the last 72 hours. CBG: Recent Labs  Lab 11/04/23 1122 11/04/23 1508 11/04/23 2119 11/05/23 0742 11/05/23 1208  GLUCAP 206* 140* 135* 131* 193*   Lipid Profile: No results for input(s): CHOL, HDL, LDLCALC, TRIG, CHOLHDL, LDLDIRECT in the last 72 hours. Thyroid Function Tests: No results for input(s): TSH, T4TOTAL, FREET4, T3FREE, THYROIDAB in the last 72 hours. Anemia Panel: No results for input(s): VITAMINB12, FOLATE, FERRITIN, TIBC, IRON , RETICCTPCT in the  last 72 hours. Sepsis Labs: No results for input(s): PROCALCITON, LATICACIDVEN in the last 168 hours.  No results found for this or any previous visit (from the past 240 hours).   Radiology Studies: No results found.  Scheduled Meds:  atorvastatin   40 mg Oral Daily   budesonide -glycopyrrolate -formoterol   2 puff Inhalation BID   vitamin B-12  500 mcg Oral Daily   enoxaparin  (LOVENOX ) injection  40 mg Subcutaneous Q24H   insulin  aspart  0-9 Units Subcutaneous TID WC   insulin  glargine-yfgn  15 Units Subcutaneous QHS   lisinopril   30 mg Oral QHS   Ensure Max Protein  11 oz Oral BID   QUEtiapine   50 mg Oral QHS   QUEtiapine   50 mg Oral q morning   sodium chloride  flush  3 mL Intravenous Q12H   Continuous Infusions:   LOS: 15 days    Time spent: 50 mins    Darcel Dawley, MD [facility]   If 7PM-7AM, please contact night-coverage

## 2023-11-05 NOTE — Treatment Plan (Signed)
 Occupational Therapy Treatment Patient Details Name: Allison Hughes MRN: [id number] DOB: [birth year] Today's Date: 11/05/2023   History of present illness 73 yo female found down on floor 10/6. Recent hospitalization 9/5-9/10 found on floor hypoxic with PNA and UTI d/c to [facility].  Currently pt with confusion, paranoid thoughts and forgetfulness. Found to have COVID and acute metabolic encephalopathy. PMH HTN HLD CVA COPD DM2 GIB, Obesity   OT comments  Pt progressed from OOB to sink level. Pt incontinence of bowel and bladder without awareness. Pt babbled tangential information not related to session throughout. Recommendation for skilled inpatient follow up therapy, <3 hours/day.  Call me Rufina      If plan is discharge home, recommend the following:  A lot of help with walking and/or transfers;A lot of help with bathing/dressing/bathroom;Assistance with cooking/housework;Assistance with feeding;Direct supervision/assist for medications management;Direct supervision/assist for financial management;Assist for transportation;Help with stairs or ramp for entrance;Supervision due to cognitive status   Equipment Recommendations  BSC/3in1;Wheelchair (measurements OT);Wheelchair cushion (measurements OT);Other (comment) (RW)    Recommendations for Other Services Speech consult;PT consult    Precautions / Restrictions Precautions Precautions: Fall Recall of Precautions/Restrictions: Impaired Precaution/Restrictions Comments: AMS,  watch SpO2 Restrictions Weight Bearing Restrictions Per Provider Order: No       Mobility Bed Mobility Overal bed mobility: Needs Assistance Bed Mobility: Supine to Sit, Sit to Supine     Supine to sit: HOB elevated, Used rails, +2 for physical assistance, Total assist Sit to supine: Total assist, +2 for physical assistance   General bed mobility comments: pt does not follow commands for coming to EoB, ultimately needs total Ax2 with pad  scoot to bring her to the EoB, she is able to static sit with increased posterior lean with physical A pt able to place hands on knees for better balance but utimately needs total A with the pad to get feet on the floor    Transfers Overall transfer level: Needs assistance Equipment used: 2 person hand held assist, Rolling walker (2 wheels) Transfers: Sit to/from Stand Sit to Stand: Mod assist, +2 physical assistance           General transfer comment: pt in modAx2 for standing from bed and straight back chair x3, once up she needs UE support for static standing,     Balance Overall balance assessment: Needs assistance Sitting-balance support: Feet supported, No upper extremity supported Sitting balance-Leahy Scale: Fair Sitting balance - Comments: able to static sit but given CGA for safety   Standing balance support: Bilateral upper extremity supported Standing balance-Leahy Scale: Zero                             ADL either performed or assessed with clinical judgement   ADL Overall ADL's : Needs assistance/impaired Eating/Feeding: Minimal assistance;Sitting   Grooming: Wash/dry hands;Wash/dry face;Minimal assistance;Standing Grooming Details (indicate cue type and reason): total a to comb hair             Lower Body Dressing: Maximal assistance Lower Body Dressing Details (indicate cue type and reason): socks min A, total for peri care Toilet Transfer: Moderate assistance   Toileting- Clothing Manipulation and Hygiene: Total assistance              Extremity/Trunk Assessment Upper Extremity Assessment Upper Extremity Assessment: Overall WFL for tasks assessed   Lower Extremity Assessment Lower Extremity Assessment: Defer to PT evaluation        Vision  Vision Assessment?: Vision impaired- to be further tested in functional context Additional Comments: R gaze preference   Perception     Praxis     Communication  Communication Communication: Impaired (per prior notes) Factors Affecting Communication: Hearing impaired   Cognition Arousal: Alert Behavior During Therapy: Flat affect Cognition: Cognition impaired   Orientation impairments: Place, Time, Situation Awareness: Intellectual awareness impaired, Online awareness impaired       OT - Cognition Comments: pt tangential speech about topic that was not related to session                 Following commands: Impaired Following commands impaired: Follows one step commands inconsistently, Follows one step commands with increased time      Cueing   Cueing Techniques: Verbal cues, Gestural cues, Tactile cues, Visual cues  Exercises      Shoulder Instructions       General Comments  3L    Pertinent Vitals/ Pain       Pain Assessment Pain Assessment: No/denies pain  Home Living                                          Prior Functioning/Environment              Frequency  Min 2X/week        Progress Toward Goals  OT Goals(current goals can now be found in the care plan section)  Progress towards OT goals: Progressing toward goals  Acute Rehab OT Goals Patient Stated Goal: none stated OT Goal Formulation: With patient Time For Goal Achievement: 11/19/23 Potential to Achieve Goals: Fair ADL Goals Pt Will Perform Grooming: with set-up;sitting Pt Will Perform Upper Body Bathing: with min assist;sitting Pt Will Transfer to Toilet: with mod assist;bedside commode;stand pivot transfer Additional ADL Goal #1: pt will complete bed mobility mod (A) Additional ADL Goal #2: pt will follow 2 step command 50% of attempts  Plan      Co-evaluation    PT/OT/SLP Co-Evaluation/Treatment: Yes Reason for Co-Treatment: Necessary to address cognition/behavior during functional activity PT goals addressed during session: Balance;Mobility/safety with mobility OT goals addressed during session: ADL's and  self-care      AM-PAC OT 6 Clicks Daily Activity     Outcome Measure   Help from another person eating meals?: A Little Help from another person taking care of personal grooming?: A Little Help from another person toileting, which includes using toliet, bedpan, or urinal?: A Lot Help from another person bathing (including washing, rinsing, drying)?: A Lot Help from another person to put on and taking off regular upper body clothing?: A Lot Help from another person to put on and taking off regular lower body clothing?: A Lot 6 Click Score: 14    End of Session Equipment Utilized During Treatment: Gait belt;Rolling walker (2 wheels);Oxygen  OT Visit Diagnosis: Unsteadiness on feet (R26.81);Muscle weakness (generalized) (M62.81)   Activity Tolerance Patient tolerated treatment well   Patient Left in bed;with call bell/phone within reach;with bed alarm set   Nurse Communication Mobility status;Precautions        Time: 1340-1431 OT Time Calculation (min): 51 min  Charges: OT General Charges $OT Visit: 1 Visit OT Treatments $Self Care/Home Management : 38-52 mins   Brynn, OTR/L  Acute Rehabilitation Services Office: [phone] .   Ely Molt 11/05/2023, 4:20 PM

## 2023-11-05 NOTE — Progress Notes (Signed)
 Physical Therapy Treatment Patient Details Name: Allison Hughes MRN: [id number] DOB: 08/15/1950 Today's Date: 11/05/2023   History of Present Illness 73 yo female found down on floor 10/6. Recent hospitalization 9/5-9/10 found on floor hypoxic with PNA and UTI d/c to [facility].  Currently pt with confusion, paranoid thoughts and forgetfulness. Found to have COVID and acute metabolic encephalopathy. PMH HTN HLD CVA COPD DM2 GIB, Obesity    PT Comments  Pt continues to have increased confusion in afternoon. She does recognize PT but then has nonsensical conversation and decreased command follow. Due to decreased command follow pt is total Ax2 for bed mobility, grossly modAx2 for transfers and  min-maxAx2 for ambulation. Pt would benefit from placement in inpatient rehab <3 hrs/day with memory care. PT will continue to follow acutely.    If plan is discharge home, recommend the following: A lot of help with walking and/or transfers;A lot of help with bathing/dressing/bathroom;Assistance with cooking/housework;Direct supervision/assist for medications management;Direct supervision/assist for financial management;Assist for transportation;Assistance with feeding;Help with stairs or ramp for entrance;Supervision due to cognitive status   Can travel by private vehicle     Yes  Equipment Recommendations  Wheelchair (measurements PT);Wheelchair cushion (measurements PT)    Recommendations for Other Services       Precautions / Restrictions Precautions Precautions: Fall Recall of Precautions/Restrictions: Impaired Precaution/Restrictions Comments: AMS,  watch SpO2 Restrictions Weight Bearing Restrictions Per Provider Order: No     Mobility  Bed Mobility Overal bed mobility: Needs Assistance Bed Mobility: Supine to Sit, Sit to Supine     Supine to sit: HOB elevated, Used rails, +2 for physical assistance, Total assist Sit to supine: Total assist, +2 for physical assistance    General bed mobility comments: pt does not follow commands for coming to EoB, ultimately needs total Ax2 with pad scoot to bring her to the EoB, she is able to static sit with increased posterior lean with physical A pt able to place hands on knees for better balance but utimately needs total A with the pad to get feet on the floor    Transfers Overall transfer level: Needs assistance Equipment used: 2 person hand held assist, Rolling walker (2 wheels) Transfers: Sit to/from Stand Sit to Stand: Mod assist, +2 physical assistance Stand pivot transfers: Mod assist, Total assist, +2 physical assistance         General transfer comment: pt in modAx2 for standing from bed and straight back chair x3, once up she needs UE support for static standing,    Ambulation/Gait Ambulation/Gait assistance: +2 physical assistance, Min assist Gait Distance (Feet): 5 Feet (+10, +15) Assistive device: 2 person hand held assist Gait Pattern/deviations: Step-through pattern, Step-to pattern, Decreased step length - right, Decreased step length - left, Shuffle, Trunk flexed Gait velocity: reduced Gait velocity interpretation: <1.31 ft/sec, indicative of household ambulator   General Gait Details: pt requires modAx2 progressing to maxAx2 for ambulation to door, pt requiring constant cuing for sequencing stepping, after about 5 feet PT asked pt if she remembered what she was doing and she responded that she did not know. Pt continued but with increasing assist needed. Pt ultimately needed to sit in straight back chair and slid back to bed, pt with decreased response to questions       Balance Overall balance assessment: Needs assistance Sitting-balance support: Feet supported, No upper extremity supported Sitting balance-Leahy Scale: Fair Sitting balance - Comments: able to static sit but given CGA for safety   Standing balance  support: Bilateral upper extremity supported Standing balance-Leahy Scale:  Zero Standing balance comment: to zero she is able to static stand at sink for pericare with out outside assist but does less well with RW                            Communication Communication Communication: Impaired (per prior notes) Factors Affecting Communication: Hearing impaired  Cognition Arousal: Alert Behavior During Therapy: Flat affect   PT - Cognitive impairments: No family/caregiver present to determine baseline, History of cognitive impairments, Orientation, Attention, Sequencing, Awareness   Orientation impairments: Place, Time, Situation                   PT - Cognition Comments: cognition in afternoon is worse than when she is seen in the morning. pt with non-sense conversations with word salad Following commands: Impaired Following commands impaired: Follows one step commands inconsistently, Follows one step commands with increased time    Cueing Cueing Techniques: Verbal cues, Gestural cues, Tactile cues, Visual cues     General Comments General comments (skin integrity, edema, etc.): pt very nonsensical in her conversation today, has brief spell of decreased attention similar to last PT session however does not last long and with sitting up with bed in chair position pt continues conversation and eats her chocolate chip cookie      Pertinent Vitals/Pain Pain Assessment Pain Assessment: No/denies pain           PT Goals (current goals can now be found in the care plan section) Acute Rehab PT Goals PT Goal Formulation: With patient Time For Goal Achievement: 11/04/23 Potential to Achieve Goals: Fair Progress towards PT goals: Progressing toward goals    Frequency    Min 2X/week           Co-evaluation PT/OT/SLP Co-Evaluation/Treatment: Yes Reason for Co-Treatment: Necessary to address cognition/behavior during functional activity PT goals addressed during session: Balance;Mobility/safety with mobility        AM-PAC PT 6  Clicks Mobility   Outcome Measure  Help needed turning from your back to your side while in a flat bed without using bedrails?: A Little Help needed moving from lying on your back to sitting on the side of a flat bed without using bedrails?: A Little Help needed moving to and from a bed to a chair (including a wheelchair)?: A Lot Help needed standing up from a chair using your arms (e.g., wheelchair or bedside chair)?: Total Help needed to walk in hospital room?: Total Help needed climbing 3-5 steps with a railing? : Total 6 Click Score: 11    End of Session Equipment Utilized During Treatment: Gait belt Activity Tolerance: Patient tolerated treatment well;Treatment limited secondary to medical complications (Comment) Patient left: in bed;with call bell/phone within reach;with bed alarm set;with nursing/sitter in room Nurse Communication: Mobility status PT Visit Diagnosis: Muscle weakness (generalized) (M62.81);Repeated falls (R29.6);History of falling (Z91.81);Difficulty in walking, not elsewhere classified (R26.2);Pain;Unsteadiness on feet (R26.81);Other abnormalities of gait and mobility (R26.89) Pain - Right/Left: Left Pain - part of body: Ankle and joints of foot     Time: 1232- 1340 PT Time Calculation (min) (ACUTE ONLY): 68 min  Charges:    $Gait Training:  8-22 mins $Therapeutic Activity: 8-22 mins PT General Charges $$ ACUTE PT VISIT: 1 Visit                     Jaidence Geisler B. Van Fleet PT, DPT  Acute Rehabilitation Services Please use secure chat or  Call Office [phone]    Almarie KATHEE Salinas [facility] 11/05/2023, 3:58 PM

## 2023-11-05 NOTE — Plan of Care (Signed)

## 2023-11-06 ENCOUNTER — Other Ambulatory Visit: Payer: Self-pay | Admitting: Internal Medicine

## 2023-11-06 DIAGNOSIS — G9341 Metabolic encephalopathy: Secondary | ICD-10-CM | POA: Diagnosis not present

## 2023-11-06 LAB — GLUCOSE, CAPILLARY
Glucose-Capillary: 158 mg/dL — ABNORMAL HIGH (ref 70–99)
Glucose-Capillary: 130 mg/dL — ABNORMAL HIGH (ref 70–99)
Glucose-Capillary: 156 mg/dL — ABNORMAL HIGH (ref 70–99)
Glucose-Capillary: 139 mg/dL — ABNORMAL HIGH (ref 70–99)

## 2023-11-06 NOTE — Progress Notes (Signed)
 PROGRESS NOTE    Allison Hughes  FMW:[id number] DOB: [birth year] DOA: 10/20/2023 PCP: Valma Carwin, MD   Brief Narrative:  This 73 yrs old female with PMH significant for hypertension, hyperlipidemia, CVA, COPD, diabetes mellitus type 2, GI bleed, neuroendocrine ulcer, and obesity who presents after being found on the floor confused.  She is being treated for acute metabolic encephalopathy in the setting of urinary tract infection, acute hypoxic respiratory failure secondary to COVID-19 infection and electrolytes which have been replaced. Further hospital course and management as outlined below.   Patient medically stable for discharge to SNF pending insurance authorization, having completed required 10 days isolation for covid.  Assessment & Plan:   Active Problems:   Chronic diastolic CHF (congestive heart failure) (HCC)   Fall at home, initial encounter   Essential hypertension   DM (diabetes mellitus), type 2 (HCC)   Hyperlipidemia   History of stroke   History of gastric ulcer   Acute Metabolic encephalopathy: > Improved. In the setting of baseline cognitive impairment.   Mentation now around baseline per Family. They reports her confusion waxes and wanes. CT head is negative for acute intracranial abnormality.  MRI brain showing No acute intracranial abnormality. Old left cerebellar infarct.  EEG did not show any seizures.  Suspect made worse from UTI. She has finished the course of IV ceftriaxone .  -Staff reports pt has been agitated during the day on a daily basis.  -Continue bedtime seroquel  with added QAM dose as well. Increase AM dose to 50mg . Cont 50mg  at bedtime dose. -Continue  PRN haldol  to 5mg  q6h PRN -Restraints as needed for patient's safety.   Acute hypoxic respiratory failure secondary to COVID: Positive for COVID-19 infection. Now weaned down to room air. -stopped corticosteroids on 10/23/23. -Continue bronchodilators as needed. - Off isolation after 10  days after COVID diagnosis.     Left foot and ankle pain and some swelling on the dorsum of the foot: X rays of the left foot showed evidence of soft tissue swelling without any acute fractures. PT/ OT Evaluation. Continue Tylenol  PRN.   Hypokalemia and hypomagnesemia Replaced.  Continue to monitor   Hospital acquired delirium superimposed on baseline cognitive impairment: Continue Frequent reorientation. Started on seroquel  25 mg at bedtime on 10/9, increased to 50 mg - continue at bedtime.  History of insomnia: Stopped trazodone for seroquel  as above. PCP follow up   H/o CVA : Continue with PT and OT On statin, not on antiplatelet (defer to PCP)   GERD: Continue PPI.   Hyperlipidemia: Continue statin .   Controlled type 2 DM : Hb A1c is 6.9% --Resume metformin  and Tresiba . --PCP follow up. --Monitor sugars   Obesity: Body mass index is 33.1 kg/m. Complicates overall care and prognosis.   Recommend lifestyle modifications including physical activity and diet for weight loss and overall long-term health.     DVT prophylaxis: Lovenox  Code Status: Full code Family Communication: No family at bed side. Disposition Plan:    Status is: Inpatient Remains inpatient appropriate because: Severity of illness.    Consultants:  None  Procedures:  Antimicrobials:  Anti-infectives (From admission, onward)    Start     Dose/Rate Route Frequency Ordered Stop   10/21/23 1000  cefTRIAXone  (ROCEPHIN ) 1 g in sodium chloride  0.9 % 100 mL IVPB  Status:  Discontinued        1 g 200 mL/hr over 30 Minutes Intravenous Every 24 hours 10/21/23 0110 10/23/23 1015   10/20/23 1230  cefTRIAXone  (ROCEPHIN ) 1 g in sodium chloride  0.9 % 100 mL IVPB        1 g 200 mL/hr over 30 Minutes Intravenous  Once 10/20/23 1220 10/20/23 1338      Subjective: Patient was seen and examined at bedside.  Overnight events noted. Patient was slightly agitated but otherwise following commands and  responding appropriately. RN denies any other concerns.  Objective: Vitals:   11/06/23 0449 11/06/23 0648 11/06/23 0759 11/06/23 0800  BP: 137/77 127/69 122/70   Pulse: 74 73 76 76  Resp:  17    Temp: 97.7 F (36.5 C) (!) 97.5 F (36.4 C) (!) 97.5 F (36.4 C)   TempSrc:  Oral Oral   SpO2: 98% 95% 93%   Weight:      Height:        Intake/Output Summary (Last 24 hours) at 11/06/2023 1155 Last data filed at 11/06/2023 0600 Gross per 24 hour  Intake 937 ml  Output 800 ml  Net 137 ml   Filed Weights   11/02/23 0700 11/03/23 0636 11/04/23 0500  Weight: 67.7 kg 67.9 kg 68.5 kg    Examination:  General exam: Appears calm and comfortable, not in any acute distress. Respiratory system: CTA Bilaterally. Respiratory effort normal. RR 18 Cardiovascular system: S1 & S2 heard, RRR. No JVD, murmurs, rubs, gallops or clicks.  Gastrointestinal system: Abdomen is non distended, soft and non tender.  Normal bowel sounds heard. Central nervous system: Alert and oriented x 2. No focal neurological deficits. Extremities: No edema, no cyanosis, no clubbing. Skin: No rashes, lesions or ulcers Psychiatry: Judgement and insight appear normal. Mood & affect appropriate.     Data Reviewed: I have personally reviewed following labs and imaging studies  CBC: Recent Labs  Lab 10/31/23 0721 11/05/23 0425  WBC 6.5 5.9  HGB 12.0 11.4*  HCT 39.7 38.3  MCV 73.9* 76.0*  PLT 352 283   Basic Metabolic Panel: Recent Labs  Lab 10/31/23 0721 11/01/23 1438 11/02/23 0632 11/05/23 0425  NA 140 141 142 141  K 2.9* 3.2* 3.7 3.4*  CL 104 108 110 108  CO2 24 25 22 25   GLUCOSE 125* 115* 117* 116*  BUN 8 9 8 9   CREATININE 0.46 0.56 0.50 0.57  CALCIUM  9.2 9.2 9.1 9.2  MG 1.6*  --  1.6* 1.7   GFR: Estimated Creatinine Clearance: 57.7 mL/min (by C-G formula based on SCr of 0.57 mg/dL). Liver Function Tests: Recent Labs  Lab 11/01/23 1438 11/02/23 0632 11/05/23 0425  AST 35 29 20  ALT 32  30 25  ALKPHOS 120 106 129*  BILITOT 0.4 0.6 0.6  PROT 4.7* 5.9* 5.9*  ALBUMIN 2.8* 2.8* 2.5*   No results for input(s): LIPASE, AMYLASE in the last 168 hours. No results for input(s): AMMONIA in the last 168 hours. Coagulation Profile: No results for input(s): INR, PROTIME in the last 168 hours. Cardiac Enzymes: No results for input(s): CKTOTAL, CKMB, CKMBINDEX, TROPONINI in the last 168 hours. BNP (last 3 results) No results for input(s): PROBNP in the last 8760 hours. HbA1C: No results for input(s): HGBA1C in the last 72 hours. CBG: Recent Labs  Lab 11/05/23 1208 11/05/23 1603 11/05/23 1959 11/05/23 2153 11/06/23 0759  GLUCAP 193* 167* 144* 210* 158*   Lipid Profile: No results for input(s): CHOL, HDL, LDLCALC, TRIG, CHOLHDL, LDLDIRECT in the last 72 hours. Thyroid Function Tests: No results for input(s): TSH, T4TOTAL, FREET4, T3FREE, THYROIDAB in the last 72 hours. Anemia Panel: No results for  input(s): VITAMINB12, FOLATE, FERRITIN, TIBC, IRON , RETICCTPCT in the last 72 hours. Sepsis Labs: No results for input(s): PROCALCITON, LATICACIDVEN in the last 168 hours.  No results found for this or any previous visit (from the past 240 hours).   Radiology Studies: No results found.  Scheduled Meds:  atorvastatin   40 mg Oral Daily   budesonide -glycopyrrolate -formoterol   2 puff Inhalation BID   vitamin B-12  500 mcg Oral Daily   enoxaparin  (LOVENOX ) injection  40 mg Subcutaneous Q24H   insulin  aspart  0-9 Units Subcutaneous TID WC   insulin  glargine-yfgn  15 Units Subcutaneous QHS   lisinopril   30 mg Oral QHS   Ensure Max Protein  11 oz Oral BID   QUEtiapine   50 mg Oral QHS   QUEtiapine   50 mg Oral q morning   sodium chloride  flush  3 mL Intravenous Q12H   Continuous Infusions:   LOS: 16 days    Time spent: 35 mins    Darcel Dawley, MD [facility]   If 7PM-7AM, please contact  night-coverage

## 2023-11-06 NOTE — Plan of Care (Signed)

## 2023-11-06 NOTE — Progress Notes (Signed)
 Occupational Therapy Treatment Patient Details Name: Allison Hughes MRN: [id number] DOB: 1950-06-28 Today's Date: 11/06/2023   History of present illness 73 yo female found down on floor 10/6. Recent hospitalization 9/5-9/10 found on floor hypoxic with PNA and UTI d/c to [facility].  Currently pt with confusion, paranoid thoughts and forgetfulness. Found to have COVID and acute metabolic encephalopathy. PMH HTN HLD CVA COPD DM2 GIB, Obesity   OT comments  Patient received in supine and agreeable to OT session. Patient demonstrating gains with bed mobility and transfers with min assist. Patient able to stand at sink with sink for support and min assist to perform grooming tasks. Patient left in recliner with nursing.  Patient will benefit from continued inpatient follow up therapy, <3 hours/day.  Acute OT to continue to follow to address established goals to facilitate DC to next venue of care.        If plan is discharge home, recommend the following:  A lot of help with walking and/or transfers;A lot of help with bathing/dressing/bathroom;Assistance with cooking/housework;Assistance with feeding;Direct supervision/assist for medications management;Direct supervision/assist for financial management;Assist for transportation;Help with stairs or ramp for entrance;Supervision due to cognitive status   Equipment Recommendations  BSC/3in1;Wheelchair (measurements OT);Wheelchair cushion (measurements OT);Other (comment) (RW)    Recommendations for Other Services      Precautions / Restrictions Precautions Precautions: Fall Recall of Precautions/Restrictions: Impaired Precaution/Restrictions Comments: AMS,  watch SpO2 Restrictions Weight Bearing Restrictions Per Provider Order: No       Mobility Bed Mobility Overal bed mobility: Needs Assistance Bed Mobility: Supine to Sit     Supine to sit: Min assist, HOB elevated, Used rails     General bed mobility comments: cues to  initiate and assistance to scoot towards EOB    Transfers Overall transfer level: Needs assistance Equipment used: Rolling walker (2 wheels) Transfers: Sit to/from Stand, Bed to chair/wheelchair/BSC Sit to Stand: Min assist, From elevated surface     Step pivot transfers: Min assist     General transfer comment: cues for hand placement and min assist for balance     Balance Overall balance assessment: Needs assistance Sitting-balance support: Feet supported, No upper extremity supported Sitting balance-Leahy Scale: Fair Sitting balance - Comments: EOB   Standing balance support: Single extremity supported, Bilateral upper extremity supported, During functional activity Standing balance-Leahy Scale: Poor Standing balance comment: stood at sink for grooming tasks with min assist                           ADL either performed or assessed with clinical judgement   ADL Overall ADL's : Needs assistance/impaired     Grooming: Wash/dry hands;Wash/dry face;Oral care;Minimal assistance;Cueing for sequencing;Standing               Lower Body Dressing: Minimal assistance;Bed level Lower Body Dressing Details (indicate cue type and reason): patient donned prior to getting to [facility]                    Extremity/Trunk Assessment              Vision       Perception     Praxis     Communication Communication Factors Affecting Communication: Hearing impaired   Cognition Arousal: Alert Behavior During Therapy: Flat affect Cognition: Cognition impaired   Orientation impairments: Place, Time, Situation Awareness: Intellectual awareness impaired, Online awareness impaired Memory impairment (select all impairments): Short-term memory, Working [employer] fast streamer, Event organiser  long-term memory, Declarative long-term memory   Executive functioning impairment (select all impairments): Initiation                   Following commands: Impaired Following  commands impaired: Follows one step commands inconsistently, Follows one step commands with increased time      Cueing   Cueing Techniques: Verbal cues, Gestural cues, Tactile cues, Visual cues  Exercises      Shoulder Instructions       General Comments      Pertinent Vitals/ Pain       Pain Assessment Pain Assessment: Faces Faces Pain Scale: Hurts a little bit Pain Location: generalized Pain Descriptors / Indicators: Grimacing Pain Intervention(s): Limited activity within patient's tolerance, Monitored during session, Repositioned  Home Living                                          Prior Functioning/Environment              Frequency  Min 2X/week        Progress Toward Goals  OT Goals(current goals can now be found in the care plan section)  Progress towards OT goals: Progressing toward goals  Acute Rehab OT Goals Patient Stated Goal: none stated OT Goal Formulation: With patient Time For Goal Achievement: 11/19/23 Potential to Achieve Goals: Fair ADL Goals Pt Will Perform Grooming: with set-up;sitting Pt Will Perform Upper Body Bathing: with min assist;sitting Pt Will Transfer to Toilet: with mod assist;bedside commode;stand pivot transfer Additional ADL Goal #1: Pt will complete bed mobiliyt with mod A as a precursor to ADLs Additional ADL Goal #2: Pt will follow 2 step commands 50% of attempts  Plan      Co-evaluation                 AM-PAC OT 6 Clicks Daily Activity     Outcome Measure   Help from another person eating meals?: A Little Help from another person taking care of personal grooming?: A Little Help from another person toileting, which includes using toliet, bedpan, or urinal?: A Lot Help from another person bathing (including washing, rinsing, drying)?: A Lot Help from another person to put on and taking off regular upper body clothing?: A Lot Help from another person to put on and taking off regular lower  body clothing?: A Lot 6 Click Score: 14    End of Session Equipment Utilized During Treatment: Gait belt;Rolling walker (2 wheels);Oxygen  OT Visit Diagnosis: Unsteadiness on feet (R26.81);Muscle weakness (generalized) (M62.81)   Activity Tolerance Patient tolerated treatment well   Patient Left in chair;with call bell/phone within reach;with nursing/sitter in room   Nurse Communication Mobility status;Precautions        Time: 9141-9082 OT Time Calculation (min): 19 min  Charges: OT General Charges $OT Visit: 1 Visit OT Treatments $Self Care/Home Management : 8-22 mins  Dick Laine, OTA Acute Rehabilitation Services  Office [phone]   Jeb LITTIE Laine 11/06/2023, 12:31 PM

## 2023-11-06 NOTE — Progress Notes (Unsigned)
 {  Select_TRH_Note:[id number]}

## 2023-11-07 DIAGNOSIS — G9341 Metabolic encephalopathy: Secondary | ICD-10-CM | POA: Diagnosis not present

## 2023-11-07 LAB — GLUCOSE, CAPILLARY
Glucose-Capillary: 144 mg/dL — ABNORMAL HIGH (ref 70–99)
Glucose-Capillary: 145 mg/dL — ABNORMAL HIGH (ref 70–99)
Glucose-Capillary: 226 mg/dL — ABNORMAL HIGH (ref 70–99)
Glucose-Capillary: 148 mg/dL — ABNORMAL HIGH (ref 70–99)

## 2023-11-07 NOTE — Progress Notes (Signed)
 Physical Therapy Treatment Patient Details Name: Allison Hughes MRN: [id number] DOB: October 08, 1950 Today's Date: 11/07/2023   History of Present Illness 73 yo female found down on floor 10/6. Recent hospitalization 9/5-9/10 found on floor hypoxic with PNA and UTI d/c to [facility].  Currently pt with confusion, paranoid thoughts and forgetfulness. Found to have COVID and acute metabolic encephalopathy. PMH HTN HLD CVA COPD DM2 GIB, Obesity    PT Comments  Pt happy to see therapist on entry, reporting she needs to get up to poop. With high visual cues pt comes to seated EoB. Pt refuses to get up to walk, and the reports she doesn't need to poop but she wishes she could. Pt ultimately agrees to minimal seated exercises and then returns to supine. D/c plans remain appropriate. PT will continue to follow acutely.     If plan is discharge home, recommend the following: A lot of help with walking and/or transfers;A lot of help with bathing/dressing/bathroom;Assistance with cooking/housework;Direct supervision/assist for medications management;Direct supervision/assist for financial management;Assist for transportation;Assistance with feeding;Help with stairs or ramp for entrance;Supervision due to cognitive status   Can travel by private vehicle     Yes  Equipment Recommendations  Wheelchair (measurements PT);Wheelchair cushion (measurements PT)       Precautions / Restrictions Precautions Precautions: Fall Recall of Precautions/Restrictions: Impaired Precaution/Restrictions Comments: AMS,  watch SpO2 Restrictions Weight Bearing Restrictions Per Provider Order: No     Mobility  Bed Mobility Overal bed mobility: Needs Assistance Bed Mobility: Supine to Sit, Sit to Supine     Supine to sit: HOB elevated, Supervision Sit to supine: HOB elevated, Supervision   General bed mobility comments: high visual cues for coming to seated EoB, and for return to supine    Transfers                    General transfer comment: despite maximal encourgement pt refuses to get up and walk        Balance Overall balance assessment: Needs assistance Sitting-balance support: Feet supported, No upper extremity supported Sitting balance-Leahy Scale: Good                                      Communication Communication Communication: Impaired (per prior notes) Factors Affecting Communication: Hearing impaired  Cognition Arousal: Alert Behavior During Therapy: Flat affect   PT - Cognitive impairments: No family/caregiver present to determine baseline, History of cognitive impairments, Orientation, Attention, Sequencing, Awareness   Orientation impairments: Place, Time, Situation                   PT - Cognition Comments: nonsense conversation Following commands: Impaired Following commands impaired: Follows one step commands inconsistently, Follows one step commands with increased time    Cueing Cueing Techniques: Verbal cues, Gestural cues, Tactile cues, Visual cues  Exercises General Exercises - Lower Extremity Ankle Circles/Pumps: AROM, Both, 5 reps Heel Slides: AROM, Both, 5 reps, Seated Hip Flexion/Marching: AROM, Both, 5 reps, Seated    General Comments General comments (skin integrity, edema, etc.): VSS on [place] 3L O2      Pertinent Vitals/Pain Pain Assessment Pain Assessment: No/denies pain     PT Goals (current goals can now be found in the care plan section) Acute Rehab PT Goals PT Goal Formulation: With patient Time For Goal Achievement: 11/04/23 Potential to Achieve Goals: Fair Progress towards PT goals: Not progressing  toward goals - comment    Frequency    Min 2X/week       AM-PAC PT 6 Clicks Mobility   Outcome Measure  Help needed turning from your back to your side while in a flat bed without using bedrails?: A Little Help needed moving from lying on your back to sitting on the side of a flat bed  without using bedrails?: A Little Help needed moving to and from a bed to a chair (including a wheelchair)?: A Lot Help needed standing up from a chair using your arms (e.g., wheelchair or bedside chair)?: Total Help needed to walk in hospital room?: Total Help needed climbing 3-5 steps with a railing? : Total 6 Click Score: 11    End of Session   Activity Tolerance: Patient tolerated treatment well Patient left: in bed;with call bell/phone within reach;with bed alarm set Nurse Communication: Mobility status PT Visit Diagnosis: Muscle weakness (generalized) (M62.81);Repeated falls (R29.6);History of falling (Z91.81);Difficulty in walking, not elsewhere classified (R26.2);Pain;Unsteadiness on feet (R26.81);Other abnormalities of gait and mobility (R26.89) Pain - Right/Left: Left Pain - part of body: Ankle and joints of foot     Time: 1424-1435 PT Time Calculation (min) (ACUTE ONLY): 11 min  Charges:    $Therapeutic Exercise: 8-22 mins PT General Charges $$ ACUTE PT VISIT: 1 Visit                     Marvina Danner B. Fleeta Lapidus PT, DPT Acute Rehabilitation Services Please use secure chat or  Call Office [phone]    Almarie KATHEE Fleeta [facility] 11/07/2023, 2:46 PM

## 2023-11-07 NOTE — TOC Progression Note (Signed)
 Transition of Care [facility]) - Progression Note    Patient Details  Name: Allison Hughes MRN: [id number] Date of Birth: 1950-08-10  Transition of Care [facility]) CM/SW Contact  [name] Clack, [place] Phone Number: 11/07/2023, 3:10 PM  Clinical Narrative:     CSW resubmitted insurance auth for patient. Based on conversations with Madelin Jacobus at [facility], patient needs to be free of IV haldol  for 24 hours and patient is not able to go to the facility with PRN IV haldol  orders. If patient will continue to need haldol , Tammy informed CSW that [facility] is able to take patient after she has been transitioned to scheduled PO haldol . Provider made aware. CSW will continue to check in on patient's insurance authorization and update dc plan.   Expected Discharge Plan: Skilled Nursing Facility Barriers to Discharge: Continued Medical Work up, SNF Pending bed offer               Expected Discharge Plan and Services                                               Social Drivers of Health (SDOH) Interventions SDOH Screenings   Food Insecurity: No Food Insecurity (11/06/2023)  Housing: Low Risk  (11/06/2023)  Transportation Needs: No Transportation Needs (11/06/2023)  Utilities: Not At Risk (11/06/2023)  Depression (PHQ2-9): Low Risk  (10/26/2020)  Social Connections: Patient Unable To Answer (10/20/2023)  Tobacco Use: High Risk (10/20/2023)    Readmission Risk Interventions    10/24/2023    2:14 PM  Readmission Risk Prevention Plan  Transportation Screening Complete  PCP or Specialist Appt within 3-5 Days Complete  HRI or Home Care Consult Complete  Social Work Consult for Recovery Care Planning/Counseling Complete  Palliative Care Screening Complete  Medication Review Oceanographer) Referral to Pharmacy

## 2023-11-07 NOTE — Progress Notes (Signed)
 PROGRESS NOTE    Allison Hughes  FMW:[id number] DOB: [birth year] DOA: 10/20/2023 PCP: Valma Carwin, MD   Brief Narrative:  This 73 yrs old female with PMH significant for hypertension, hyperlipidemia, CVA, COPD, diabetes mellitus type 2, GI bleed, neuroendocrine ulcer, and obesity who presents after being found on the floor confused.  She is being treated for acute metabolic encephalopathy in the setting of urinary tract infection, acute hypoxic respiratory failure secondary to COVID-19 infection and electrolytes which have been replaced. Further hospital course and management as outlined below.   Patient medically stable for discharge to SNF pending insurance authorization, having completed required 10 days isolation for covid.  Assessment & Plan:   Active Problems:   Chronic diastolic CHF (congestive heart failure) (HCC)   Fall at home, initial encounter   Essential hypertension   DM (diabetes mellitus), type 2 (HCC)   Hyperlipidemia   History of stroke   History of gastric ulcer   Acute Metabolic encephalopathy: > Improved. In the setting of baseline cognitive impairment.   Mentation now around baseline per Family. They reports her confusion waxes and wanes. CT head is negative for acute intracranial abnormality.  MRI brain showing No acute intracranial abnormality. Old left cerebellar infarct.  EEG did not show any seizures.  Suspect made worse from UTI. She has finished the course of IV ceftriaxone .  -Staff reports pt has been agitated during the day on a daily basis.  -Continue bedtime seroquel  with added QAM dose as well. Increase AM dose to 50mg . Cont 50mg  at bedtime dose. -Continue  PRN haldol  to 5mg  q6h PRN -Restraints as needed for patient's safety.   Acute hypoxic respiratory failure secondary to COVID: Positive for COVID-19 infection. Now weaned down to room air. -Stopped corticosteroids on 10/23/23. -Continue bronchodilators as needed. -Off isolation after 10  days after COVID diagnosis.     Left foot and ankle pain and some swelling on the dorsum of the foot: X rays of the left foot showed evidence of soft tissue swelling without any acute fractures. PT/ OT Evaluation > SNF. Continue Tylenol  PRN.   Hypokalemia and hypomagnesemia Replaced.  Continue to monitor   Hospital acquired delirium superimposed on baseline cognitive impairment: Continue Frequent reorientation. Started on seroquel  25 mg at bedtime on 10/9, increased to 50 mg - continue at bedtime.  History of insomnia: Stopped trazodone for seroquel  as above. PCP follow up   H/o CVA : Continue with PT and OT On statin, not on antiplatelet (defer to PCP)   GERD: Continue PPI.   Hyperlipidemia: Continue statin .   Controlled type 2 DM : Hb A1c is 6.9% --Resume metformin  and Tresiba . --PCP follow up. --Monitor sugars   Obesity: Body mass index is 33.1 kg/m. Complicates overall care and prognosis.   Recommend lifestyle modifications including physical activity and diet for weight loss and overall long-term health.     DVT prophylaxis: Lovenox  Code Status: Full code Family Communication: No family at bed side. Disposition Plan:    Status is: Inpatient Remains inpatient appropriate because: Patient is now medically clear.  Awaiting SNF / insurance authorization pending    Consultants:  None  Procedures:  Antimicrobials:  Anti-infectives (From admission, onward)    Start     Dose/Rate Route Frequency Ordered Stop   10/21/23 1000  cefTRIAXone  (ROCEPHIN ) 1 g in sodium chloride  0.9 % 100 mL IVPB  Status:  Discontinued        1 g 200 mL/hr over 30 Minutes Intravenous Every 24  hours 10/21/23 0110 10/23/23 1015   10/20/23 1230  cefTRIAXone  (ROCEPHIN ) 1 g in sodium chloride  0.9 % 100 mL IVPB        1 g 200 mL/hr over 30 Minutes Intravenous  Once 10/20/23 1220 10/20/23 1338      Subjective: Patient was seen and examined at bedside.  Overnight events  noted. Patient seems improved, following commands and responding appropriately. RN denies any other concerns.  Objective: Vitals:   11/06/23 2222 11/07/23 0530 11/07/23 0824 11/07/23 0847  BP: 110/76 (!) 140/72 125/62   Pulse: 78 69 81   Resp: 18 17 16    Temp: 97.8 F (36.6 C) 97.9 F (36.6 C) 98.2 F (36.8 C)   TempSrc: Tympanic Oral Oral   SpO2: 99% 99% 95% 97%  Weight: 99 kg     Height: 5' 7 (1.702 m)       Intake/Output Summary (Last 24 hours) at 11/07/2023 1322 Last data filed at 11/07/2023 0222 Gross per 24 hour  Intake --  Output 300 ml  Net -300 ml   Filed Weights   11/03/23 0636 11/04/23 0500 11/06/23 2222  Weight: 67.9 kg 68.5 kg 99 kg    Examination:  General exam: Appears calm and comfortable, not in any acute distress. Respiratory system: CTA Bilaterally. Respiratory effort normal. RR 14 Cardiovascular system: S1 & S2 heard, RRR. No JVD, murmurs, rubs, gallops or clicks.  Gastrointestinal system: Abdomen is non distended, soft and non tender.  Normal bowel sounds heard. Central nervous system: Alert and oriented x 2. No focal neurological deficits. Extremities: No edema, no cyanosis, no clubbing. Skin: No rashes, lesions or ulcers Psychiatry: Judgement and insight appear normal. Mood & affect appropriate.     Data Reviewed: I have personally reviewed following labs and imaging studies  CBC: Recent Labs  Lab 11/05/23 0425  WBC 5.9  HGB 11.4*  HCT 38.3  MCV 76.0*  PLT 283   Basic Metabolic Panel: Recent Labs  Lab 11/01/23 1438 11/02/23 0632 11/05/23 0425  NA 141 142 141  K 3.2* 3.7 3.4*  CL 108 110 108  CO2 25 22 25   GLUCOSE 115* 117* 116*  BUN 9 8 9   CREATININE 0.56 0.50 0.57  CALCIUM  9.2 9.1 9.2  MG  --  1.6* 1.7   GFR: Estimated Creatinine Clearance: 76.9 mL/min (by C-G formula based on SCr of 0.57 mg/dL). Liver Function Tests: Recent Labs  Lab 11/01/23 1438 11/02/23 0632 11/05/23 0425  AST 35 29 20  ALT 32 30 25   ALKPHOS 120 106 129*  BILITOT 0.4 0.6 0.6  PROT 4.7* 5.9* 5.9*  ALBUMIN 2.8* 2.8* 2.5*   No results for input(s): LIPASE, AMYLASE in the last 168 hours. No results for input(s): AMMONIA in the last 168 hours. Coagulation Profile: No results for input(s): INR, PROTIME in the last 168 hours. Cardiac Enzymes: No results for input(s): CKTOTAL, CKMB, CKMBINDEX, TROPONINI in the last 168 hours. BNP (last 3 results) No results for input(s): PROBNP in the last 8760 hours. HbA1C: No results for input(s): HGBA1C in the last 72 hours. CBG: Recent Labs  Lab 11/06/23 1221 11/06/23 1618 11/06/23 2108 11/07/23 0845 11/07/23 1206  GLUCAP 130* 156* 139* 144* 145*   Lipid Profile: No results for input(s): CHOL, HDL, LDLCALC, TRIG, CHOLHDL, LDLDIRECT in the last 72 hours. Thyroid Function Tests: No results for input(s): TSH, T4TOTAL, FREET4, T3FREE, THYROIDAB in the last 72 hours. Anemia Panel: No results for input(s): VITAMINB12, FOLATE, FERRITIN, TIBC, IRON , RETICCTPCT in the  last 72 hours. Sepsis Labs: No results for input(s): PROCALCITON, LATICACIDVEN in the last 168 hours.  No results found for this or any previous visit (from the past 240 hours).   Radiology Studies: No results found.  Scheduled Meds:  atorvastatin   40 mg Oral Daily   budesonide -glycopyrrolate -formoterol   2 puff Inhalation BID   vitamin B-12  500 mcg Oral Daily   enoxaparin  (LOVENOX ) injection  40 mg Subcutaneous Q24H   insulin  aspart  0-9 Units Subcutaneous TID WC   insulin  glargine-yfgn  15 Units Subcutaneous QHS   lisinopril   30 mg Oral QHS   Ensure Max Protein  11 oz Oral BID   QUEtiapine   50 mg Oral QHS   QUEtiapine   50 mg Oral q morning   sodium chloride  flush  3 mL Intravenous Q12H   Continuous Infusions:   LOS: 17 days    Time spent: 35 mins    Darcel Dawley, MD [facility]   If 7PM-7AM, please contact night-coverage

## 2023-11-07 NOTE — Progress Notes (Signed)
 Mobility Specialist: Progress Note   11/07/23 1000  Mobility  Activity Ambulated with assistance  Level of Assistance Moderate assist, patient does 50-74%  Assistive Device Front wheel walker  Distance Ambulated (ft) 30 ft  Activity Response Tolerated well  Mobility Referral Yes  Mobility visit 1 Mobility  Mobility Specialist Start Time (ACUTE ONLY) 1000  Mobility Specialist Stop Time (ACUTE ONLY) 1030  Mobility Specialist Time Calculation (min) (ACUTE ONLY) 30 min    Pt received in bed, pleasantly confused and agreeable to mobility session. Light minA for bed mobility to assist with scooting EOB. MinG for STS. Mod verbal cues needed for hand placement during STS. MinA physical assist needed for ambulation, but pt required max verbal and tactile cues to take larger steps and stay within the RW. Took 1x seated break d/t fatigue and LE weakness. SpO2 desat to 85% on 1.5L, titrated to 2LO2 and pt recovered to >90% after seated rest and PLB. Pt slow to respond but engaged this session and aware of energy levels. Returned pt to bed. Left in bed very fatigued with all needs met, call bell in reach. Bed alarm on, telesitter in room.  [name] Mobility Specialist Please contact via SecureChat or Rehab office at [phone]

## 2023-11-07 NOTE — Plan of Care (Signed)
  Problem: Education: Goal: Ability to describe self-care measures that may prevent or decrease complications (Diabetes Survival Skills Education) will improve Outcome: Progressing Goal: Individualized Educational Video(s) Outcome: Progressing   Problem: Coping: Goal: Ability to adjust to condition or change in health will improve Outcome: Progressing   Problem: Fluid Volume: Goal: Ability to maintain a balanced intake and output will improve Outcome: Progressing   Problem: Health Behavior/Discharge Planning: Goal: Ability to identify and utilize available resources and services will improve Outcome: Progressing Goal: Ability to manage health-related needs will improve Outcome: Progressing   Problem: Skin Integrity: Goal: Risk for impaired skin integrity will decrease Outcome: Progressing   Problem: Tissue Perfusion: Goal: Adequacy of tissue perfusion will improve Outcome: Progressing   Problem: Clinical Measurements: Goal: Ability to maintain clinical measurements within normal limits will improve Outcome: Progressing Goal: Will remain free from infection Outcome: Progressing Goal: Diagnostic test results will improve Outcome: Progressing Goal: Respiratory complications will improve Outcome: Progressing Goal: Cardiovascular complication will be avoided Outcome: Progressing   Problem: Nutrition: Goal: Adequate nutrition will be maintained Outcome: Progressing   Problem: Coping: Goal: Level of anxiety will decrease Outcome: Progressing   Problem: Skin Integrity: Goal: Risk for impaired skin integrity will decrease Outcome: Progressing

## 2023-11-08 DIAGNOSIS — G9341 Metabolic encephalopathy: Secondary | ICD-10-CM | POA: Diagnosis not present

## 2023-11-08 LAB — GLUCOSE, CAPILLARY
Glucose-Capillary: 134 mg/dL — ABNORMAL HIGH (ref 70–99)
Glucose-Capillary: 210 mg/dL — ABNORMAL HIGH (ref 70–99)
Glucose-Capillary: 107 mg/dL — ABNORMAL HIGH (ref 70–99)
Glucose-Capillary: 142 mg/dL — ABNORMAL HIGH (ref 70–99)

## 2023-11-08 NOTE — Progress Notes (Signed)
 PROGRESS NOTE    Allison Hughes  FMW:[id number] DOB: 17-Mar-1950 DOA: 10/20/2023 PCP: Valma Carwin, MD   Brief Narrative:  This 73 yrs old female with PMH significant for hypertension, hyperlipidemia, CVA, COPD, diabetes mellitus type 2, GI bleed, neuroendocrine ulcer, and obesity who presents after being found on the floor confused.  She is being treated for acute metabolic encephalopathy in the setting of urinary tract infection, acute hypoxic respiratory failure secondary to COVID-19 infection and electrolytes which have been replaced. Further hospital course and management as outlined below.   Patient medically stable for discharge to SNF pending insurance authorization, having completed required 10 days isolation for covid.  Assessment & Plan:   Active Problems:   Chronic diastolic CHF (congestive heart failure) (HCC)   Fall at home, initial encounter   Essential hypertension   DM (diabetes mellitus), type 2 (HCC)   Hyperlipidemia   History of stroke   History of gastric ulcer   Acute Metabolic encephalopathy: > Improved. In the setting of baseline cognitive impairment.   Mentation now around baseline per Family. They reports her confusion waxes and wanes. CT head is negative for acute intracranial abnormality.  MRI brain showing No acute intracranial abnormality. Old left cerebellar infarct.  EEG did not show any seizures.  Suspect made worse from UTI. She has finished the course of IV ceftriaxone .  -Staff reports pt has been agitated during the day on a daily basis.  -Continue bedtime seroquel  with added QAM dose as well. Increase AM dose to 50mg . Cont 50mg  at bedtime dose. -Continue  PRN haldol  to 5mg  q6h PRN -Restraints as needed for patient's safety.   Acute hypoxic respiratory failure secondary to COVID: Positive for COVID-19 infection. Now weaned down to room air. -Stopped corticosteroids on 10/23/23. -Continue bronchodilators as needed. -Off isolation after 10  days after COVID diagnosis.   Left foot and ankle pain and some swelling on the dorsum of the foot: X rays of the left foot showed evidence of soft tissue swelling without any acute fractures. PT/ OT Evaluation > SNF. Continue Tylenol  PRN.   Hypokalemia and hypomagnesemia Replaced.  Continue to monitor   Hospital acquired delirium superimposed on baseline cognitive impairment: Continue Frequent reorientation. Started on seroquel  25 mg at bedtime on 10/9, increased to 50 mg - continue at bedtime.  History of insomnia: Stopped trazodone for seroquel  as above. PCP follow up   H/o CVA : Continue with PT and OT On statin, not on antiplatelet (defer to PCP)   GERD: Continue PPI.   Hyperlipidemia: Continue statin .   Controlled type 2 DM : Hb A1c is 6.9% --Resume metformin  and Tresiba . --PCP follow up. --Monitor sugars   Obesity: Body mass index is 33.1 kg/m. Complicates overall care and prognosis.   Recommend lifestyle modifications including physical activity and diet for weight loss and overall long-term health.     DVT prophylaxis: Lovenox  Code Status: Full code Family Communication: No family at bed side. Disposition Plan:    Status is: Inpatient Remains inpatient appropriate because: Patient is now medically clear.  Awaiting SNF / insurance authorization pending    Consultants:  None  Procedures:  Antimicrobials:  Anti-infectives (From admission, onward)    Start     Dose/Rate Route Frequency Ordered Stop   10/21/23 1000  cefTRIAXone  (ROCEPHIN ) 1 g in sodium chloride  0.9 % 100 mL IVPB  Status:  Discontinued        1 g 200 mL/hr over 30 Minutes Intravenous Every 24 hours 10/21/23  0110 10/23/23 1015   10/20/23 1230  cefTRIAXone  (ROCEPHIN ) 1 g in sodium chloride  0.9 % 100 mL IVPB        1 g 200 mL/hr over 30 Minutes Intravenous  Once 10/20/23 1220 10/20/23 1338      Subjective: Patient was seen and examined at bedside. Overnight events noted. Patient  seems improved, following commands and responding appropriately. RN denies any other concerns.  Objective: Vitals:   11/07/23 2350 11/08/23 0400 11/08/23 0823 11/08/23 1130  BP: 126/76  (!) 151/77 (!) 123/56  Pulse: 78  81 94  Resp:   17 18  Temp: 97.6 F (36.4 C)  98 F (36.7 C) 98.1 F (36.7 C)  TempSrc: Tympanic  Axillary   SpO2: 97%  96% 91%  Weight:  68 kg    Height:       No intake or output data in the 24 hours ending 11/08/23 1355  Filed Weights   11/04/23 0500 11/06/23 2222 11/08/23 0400  Weight: 68.5 kg 65 kg 68 kg    Examination:  General exam: Appears calm and comfortable, not in any acute distress. Respiratory system: CTA Bilaterally. Respiratory effort normal. RR 14 Cardiovascular system: S1 & S2 heard, RRR. No JVD, murmurs, rubs, gallops or clicks.  Gastrointestinal system: Abdomen is non distended, soft and non tender.  Normal bowel sounds heard. Central nervous system: Alert and oriented x 2. No focal neurological deficits. Extremities: No edema, no cyanosis, no clubbing. Skin: No rashes, lesions or ulcers Psychiatry: Judgement and insight appear normal. Mood & affect appropriate.     Data Reviewed: I have personally reviewed following labs and imaging studies  CBC: Recent Labs  Lab 11/05/23 0425  WBC 5.9  HGB 11.4*  HCT 38.3  MCV 76.0*  PLT 283   Basic Metabolic Panel: Recent Labs  Lab 11/01/23 1438 11/02/23 0632 11/05/23 0425  NA 141 142 141  K 3.2* 3.7 3.4*  CL 108 110 108  CO2 25 22 25   GLUCOSE 115* 117* 116*  BUN 9 8 9   CREATININE 0.56 0.50 0.57  CALCIUM  9.2 9.1 9.2  MG  --  1.6* 1.7   GFR: Estimated Creatinine Clearance: 61.8 mL/min (by C-G formula based on SCr of 0.57 mg/dL). Liver Function Tests: Recent Labs  Lab 11/01/23 1438 11/02/23 0632 11/05/23 0425  AST 35 29 20  ALT 32 30 25  ALKPHOS 120 106 129*  BILITOT 0.4 0.6 0.6  PROT 4.7* 5.9* 5.9*  ALBUMIN 2.8* 2.8* 2.5*   No results for input(s): LIPASE,  AMYLASE in the last 168 hours. No results for input(s): AMMONIA in the last 168 hours. Coagulation Profile: No results for input(s): INR, PROTIME in the last 168 hours. Cardiac Enzymes: No results for input(s): CKTOTAL, CKMB, CKMBINDEX, TROPONINI in the last 168 hours. BNP (last 3 results) No results for input(s): PROBNP in the last 8760 hours. HbA1C: No results for input(s): HGBA1C in the last 72 hours. CBG: Recent Labs  Lab 11/07/23 1206 11/07/23 1639 11/07/23 2014 11/08/23 0909 11/08/23 1210  GLUCAP 145* 226* 148* 134* 210*   Lipid Profile: No results for input(s): CHOL, HDL, LDLCALC, TRIG, CHOLHDL, LDLDIRECT in the last 72 hours. Thyroid Function Tests: No results for input(s): TSH, T4TOTAL, FREET4, T3FREE, THYROIDAB in the last 72 hours. Anemia Panel: No results for input(s): VITAMINB12, FOLATE, FERRITIN, TIBC, IRON , RETICCTPCT in the last 72 hours. Sepsis Labs: No results for input(s): PROCALCITON, LATICACIDVEN in the last 168 hours.  No results found for this or any  previous visit (from the past 240 hours).   Radiology Studies: No results found.  Scheduled Meds:  atorvastatin   40 mg Oral Daily   budesonide -glycopyrrolate -formoterol   2 puff Inhalation BID   vitamin B-12  500 mcg Oral Daily   enoxaparin  (LOVENOX ) injection  40 mg Subcutaneous Q24H   insulin  aspart  0-9 Units Subcutaneous TID WC   insulin  glargine-yfgn  15 Units Subcutaneous QHS   lisinopril   30 mg Oral QHS   Ensure Max Protein  11 oz Oral BID   QUEtiapine   50 mg Oral QHS   QUEtiapine   50 mg Oral q morning   sodium chloride  flush  3 mL Intravenous Q12H   Continuous Infusions:   LOS: 18 days    Time spent: 35 mins    Darcel Dawley, MD [facility]   If 7PM-7AM, please contact night-coverage

## 2023-11-08 NOTE — Progress Notes (Signed)
 Received patient's report from day shift RN. Restraint order was discontinued by provider. Upon assessment, restraints were not present on this patient. Restraints and restraint order were d/c by day shift RN.

## 2023-11-08 NOTE — Progress Notes (Signed)
 Patient confused, not following commands. Attempting to remove IV and gown, restless. PRN anxiety meds given. Patient sleeping comfortably at the moment. Will continue to monitor.

## 2023-11-08 NOTE — Plan of Care (Signed)
  Problem: Education: Goal: Ability to describe self-care measures that may prevent or decrease complications (Diabetes Survival Skills Education) will improve Outcome: Not Progressing Goal: Individualized Educational Video(s) Outcome: Not Progressing   Problem: Coping: Goal: Ability to adjust to condition or change in health will improve Outcome: Not Progressing   Problem: Fluid Volume: Goal: Ability to maintain a balanced intake and output will improve Outcome: Not Progressing   Problem: Health Behavior/Discharge Planning: Goal: Ability to identify and utilize available resources and services will improve Outcome: Not Progressing Goal: Ability to manage health-related needs will improve Outcome: Not Progressing   Problem: Metabolic: Goal: Ability to maintain appropriate glucose levels will improve Outcome: Not Progressing   Problem: Nutritional: Goal: Maintenance of adequate nutrition will improve Outcome: Not Progressing Goal: Progress toward achieving an optimal weight will improve Outcome: Not Progressing   Problem: Skin Integrity: Goal: Risk for impaired skin integrity will decrease Outcome: Not Progressing   Problem: Tissue Perfusion: Goal: Adequacy of tissue perfusion will improve Outcome: Not Progressing   Problem: Education: Goal: Knowledge of General Education information will improve Description: Including pain rating scale, medication(s)/side effects and non-pharmacologic comfort measures Outcome: Not Progressing   Problem: Health Behavior/Discharge Planning: Goal: Ability to manage health-related needs will improve Outcome: Not Progressing   Problem: Clinical Measurements: Goal: Ability to maintain clinical measurements within normal limits will improve Outcome: Not Progressing Goal: Will remain free from infection Outcome: Not Progressing Goal: Diagnostic test results will improve Outcome: Not Progressing Goal: Respiratory complications will  improve Outcome: Not Progressing Goal: Cardiovascular complication will be avoided Outcome: Not Progressing   Problem: Activity: Goal: Risk for activity intolerance will decrease Outcome: Not Progressing   Problem: Nutrition: Goal: Adequate nutrition will be maintained Outcome: Not Progressing   Problem: Coping: Goal: Level of anxiety will decrease Outcome: Not Progressing   Problem: Elimination: Goal: Will not experience complications related to bowel motility Outcome: Not Progressing Goal: Will not experience complications related to urinary retention Outcome: Not Progressing   Problem: Pain Managment: Goal: General experience of comfort will improve and/or be controlled Outcome: Not Progressing   Problem: Safety: Goal: Ability to remain free from injury will improve Outcome: Not Progressing   Problem: Skin Integrity: Goal: Risk for impaired skin integrity will decrease Outcome: Not Progressing   Problem: Safety: Goal: Non-violent Restraint(s) Outcome: Not Progressing

## 2023-11-08 NOTE — Progress Notes (Signed)
 Mobility Specialist: Progress Note   11/08/23 1400  Mobility  Activity Stood at bedside  Level of Assistance Maximum assist, patient does 25-49%  Assistive Device Front wheel walker  Activity Response Tolerated fair  Mobility Referral Yes  Mobility visit 1 Mobility  Mobility Specialist Start Time (ACUTE ONLY) 1000  Mobility Specialist Stop Time (ACUTE ONLY) 1023  Mobility Specialist Time Calculation (min) (ACUTE ONLY) 23 min    Pt received in bed, pleasantly confused and agreeable to mobility. ModA for bed mobility to assist with trunk elevation and scooting; physical assist needed to initiate movement. MaxA for STS with heavy posterior lean that the pt was unable to correct with max verbal cues. Stood 2x at EOB with maxA, unable to progress to ambulation this session. Took 2 side steps towards HOB during second stand. ModA for sit>supine. TotA to slide up in bed. Left in bed with all needs met, call bell in reach. Bed alarm and restraints on. Telesitter in room.   [name] Mobility Specialist Please contact via SecureChat or Rehab office at [phone]

## 2023-11-08 NOTE — TOC Progression Note (Signed)
 Transition of Care [facility]) - Progression Note    Patient Details  Name: Allison Hughes MRN: [id number] Date of Birth: December 02, 1950  Transition of Care [facility]) CM/SW Contact  Gwenn Frieze [place], [place] Phone Number: 11/08/2023, 7:26 AM  Clinical Narrative:  SW continuing to follow for SNF placement at [facility] pending auth and medical clearance. Pt required chemical restraints (IV haldol , IM ativan) yesterday afternoon and overnight. Pt will need to be free from physical and chemical restraints for 24 hours prior to dc. Home and Community/[employer]HC auth for SNF remains pending at this time.   Frieze Gwenn, MSW, LCSW [phone] (coverage)      Expected Discharge Plan: Skilled Nursing Facility Barriers to Discharge: Continued Medical Work up, SNF Pending bed offer               Expected Discharge Plan and Services                                               Social Drivers of Health (SDOH) Interventions SDOH Screenings   Food Insecurity: No Food Insecurity (11/06/2023)  Housing: Low Risk  (11/06/2023)  Transportation Needs: No Transportation Needs (11/06/2023)  Utilities: Not At Risk (11/06/2023)  Depression (PHQ2-9): Low Risk  (10/26/2020)  Social Connections: Patient Unable To Answer (10/20/2023)  Tobacco Use: High Risk (10/20/2023)    Readmission Risk Interventions    10/24/2023    2:14 PM  Readmission Risk Prevention Plan  Transportation Screening Complete  PCP or Specialist Appt within 3-5 Days Complete  HRI or Home Care Consult Complete  Social Work Consult for Recovery Care Planning/Counseling Complete  Palliative Care Screening Complete  Medication Review Oceanographer) Referral to Pharmacy

## 2023-11-08 NOTE — Plan of Care (Signed)

## 2023-11-08 NOTE — Plan of Care (Signed)
  Problem: Education: Goal: Ability to describe self-care measures that may prevent or decrease complications (Diabetes Survival Skills Education) will improve Outcome: Progressing Goal: Individualized Educational Video(s) Outcome: Progressing   Problem: Coping: Goal: Ability to adjust to condition or change in health will improve Outcome: Progressing   Problem: Fluid Volume: Goal: Ability to maintain a balanced intake and output will improve Outcome: Progressing   Problem: Health Behavior/Discharge Planning: Goal: Ability to identify and utilize available resources and services will improve Outcome: Progressing Goal: Ability to manage health-related needs will improve Outcome: Progressing   Problem: Nutritional: Goal: Maintenance of adequate nutrition will improve Outcome: Progressing Goal: Progress toward achieving an optimal weight will improve Outcome: Progressing   Problem: Skin Integrity: Goal: Risk for impaired skin integrity will decrease Outcome: Progressing   Problem: Clinical Measurements: Goal: Ability to maintain clinical measurements within normal limits will improve Outcome: Progressing Goal: Will remain free from infection Outcome: Progressing Goal: Diagnostic test results will improve Outcome: Progressing Goal: Respiratory complications will improve Outcome: Progressing Goal: Cardiovascular complication will be avoided Outcome: Progressing   Problem: Coping: Goal: Level of anxiety will decrease Outcome: Progressing

## 2023-11-09 DIAGNOSIS — G9341 Metabolic encephalopathy: Secondary | ICD-10-CM | POA: Diagnosis not present

## 2023-11-09 LAB — GLUCOSE, CAPILLARY
Glucose-Capillary: 143 mg/dL — ABNORMAL HIGH (ref 70–99)
Glucose-Capillary: 177 mg/dL — ABNORMAL HIGH (ref 70–99)
Glucose-Capillary: 179 mg/dL — ABNORMAL HIGH (ref 70–99)
Glucose-Capillary: 142 mg/dL — ABNORMAL HIGH (ref 70–99)

## 2023-11-09 NOTE — Plan of Care (Signed)

## 2023-11-09 NOTE — Plan of Care (Signed)
  Problem: Education: Goal: Ability to describe self-care measures that may prevent or decrease complications (Diabetes Survival Skills Education) will improve 11/09/2023 0229 by Evern Monica HERO, RN Outcome: Not Progressing 11/08/2023 2052 by Evern Monica HERO, RN Outcome: Not Progressing Goal: Individualized Educational Video(s) 11/09/2023 0229 by Evern Monica HERO, RN Outcome: Not Progressing 11/08/2023 2052 by Evern Monica HERO, RN Outcome: Not Progressing   Problem: Coping: Goal: Ability to adjust to condition or change in health will improve 11/09/2023 0229 by Evern Monica HERO, RN Outcome: Not Progressing 11/08/2023 2052 by Evern Monica HERO, RN Outcome: Not Progressing   Problem: Fluid Volume: Goal: Ability to maintain a balanced intake and output will improve 11/09/2023 0229 by Evern Monica HERO, RN Outcome: Not Progressing 11/08/2023 2052 by Evern Monica HERO, RN Outcome: Not Progressing   Problem: Health Behavior/Discharge Planning: Goal: Ability to identify and utilize available resources and services will improve 11/09/2023 0229 by Evern Monica HERO, RN Outcome: Not Progressing 11/08/2023 2052 by Evern Monica HERO, RN Outcome: Not Progressing Goal: Ability to manage health-related needs will improve 11/09/2023 0229 by Evern Monica HERO, RN Outcome: Not Progressing 11/08/2023 2052 by Evern Monica HERO, RN Outcome: Not Progressing   Problem: Metabolic: Goal: Ability to maintain appropriate glucose levels will improve 11/09/2023 0229 by Evern Monica HERO, RN Outcome: Not Progressing 11/08/2023 2052 by Evern Monica HERO, RN Outcome: Not Progressing

## 2023-11-09 NOTE — TOC Progression Note (Signed)
 Transition of Care [facility]) - Progression Note    Patient Details  Name: Allison Hughes MRN: [id number] Date of Birth: 1950-09-09  [facility]) CM/SW Contact  Lauraine FORBES Saa, LCSWA Phone Number: 11/09/2023, 9:44 AM  Clinical Narrative:     9:44 AM LPN informed MD and CSW that patient received haldol  yesterday at 21:13 and Ativan on October 24th. CSW informed LPN and MD that per chart review, patient will need to be off chemical restraints for 24 hours prior to discharge and that PRN haldol  would need to be discontinued for discharge. CSW also informed LPN and MD that patient's SNF insurance authorization was approved and is valid 11/08/2023-11/11/2023. CSW will continue to follow.  Expected Discharge Plan: Skilled Nursing Facility Barriers to Discharge: Continued Medical Work up, SNF Pending bed offer               Expected Discharge Plan and Services                                               Social Drivers of Health (SDOH) Interventions SDOH Screenings   Food Insecurity: No Food Insecurity (11/06/2023)  Housing: Low Risk  (11/06/2023)  Transportation Needs: No Transportation Needs (11/06/2023)  Utilities: Not At Risk (11/06/2023)  Depression (PHQ2-9): Low Risk  (10/26/2020)  Social Connections: Patient Unable To Answer (10/20/2023)  Tobacco Use: High Risk (10/20/2023)    Readmission Risk Interventions    10/24/2023    2:14 PM  Readmission Risk Prevention Plan  Transportation Screening Complete  PCP or Specialist Appt within 3-5 Days Complete  HRI or Home Care Consult Complete  Social Work Consult for Recovery Care Planning/Counseling Complete  Palliative Care Screening Complete  Medication Review Oceanographer) Referral to Pharmacy

## 2023-11-09 NOTE — Progress Notes (Signed)
 PROGRESS NOTE    Allison Hughes  FMW:[id number] DOB: Jan 12, 1951 DOA: 10/20/2023 PCP: Valma Carwin, MD   Brief Narrative:  This 73 yrs old female with PMH significant for hypertension, hyperlipidemia, CVA, COPD, diabetes mellitus type 2, GI bleed, neuroendocrine ulcer, and obesity who presents after being found on the floor confused.  She is being treated for acute metabolic encephalopathy in the setting of urinary tract infection, acute hypoxic respiratory failure secondary to COVID-19 infection and electrolytes which have been replaced. Further hospital course and management as outlined below.   Patient medically stable for discharge to SNF pending insurance authorization, having completed required 10 days isolation for covid.  Assessment & Plan:   Active Problems:   Chronic diastolic CHF (congestive heart failure) (HCC)   Fall at home, initial encounter   Essential hypertension   DM (diabetes mellitus), type 2 (HCC)   Hyperlipidemia   History of stroke   History of gastric ulcer   Acute Metabolic encephalopathy: > Improved. In the setting of baseline cognitive impairment.   Mentation now around baseline per Family. They reports her confusion waxes and wanes. CT head is negative for acute intracranial abnormality.  MRI brain showing No acute intracranial abnormality. Old left cerebellar infarct.  EEG did not show any seizures.  Suspect made worse from UTI. She has finished the course of IV ceftriaxone .  -Staff reports pt has been agitated during the day on a daily basis.  -Continue bedtime seroquel  with added QAM dose as well. Increase AM dose to 50mg . Cont 50mg  at bedtime dose. -Continue  PRN haldol  to 5mg  q6h PRN -Restraints as needed for patient's safety. -Insurance authorization approved but patient needs to be off as needed psychotropics for 24 hours for discharge.   Acute hypoxic respiratory failure secondary to COVID: Positive for COVID-19 infection. Now weaned down  to room air. -Stopped corticosteroids on 10/23/23. -Continue bronchodilators as needed. -Off isolation after 10 days after COVID diagnosis.   Left foot and ankle pain and some swelling on the dorsum of the foot: X rays of the left foot showed evidence of soft tissue swelling without any acute fractures. PT/ OT Evaluation > SNF. Continue Tylenol  PRN.   Hypokalemia and hypomagnesemia Replaced.  Continue to monitor   Hospital acquired delirium superimposed on baseline cognitive impairment: Continue Frequent reorientation. Started on seroquel  25 mg at bedtime on 10/9, increased to 50 mg - continue at bedtime.  History of insomnia: Stopped trazodone for seroquel  as above. PCP follow up   H/o CVA : Continue with PT and OT On statin, not on antiplatelet (defer to PCP)   GERD: Continue PPI.   Hyperlipidemia: Continue statin .   Controlled type 2 DM : Hb A1c is 6.9% --Resume metformin  and Tresiba . --PCP follow up. --Monitor sugars   Obesity: Body mass index is 33.1 kg/m. Complicates overall care and prognosis.   Recommend lifestyle modifications including physical activity and diet for weight loss and overall long-term health.     DVT prophylaxis: Lovenox  Code Status: Full code Family Communication: No family at bed side. Disposition Plan:    Status is: Inpatient Remains inpatient appropriate because: Patient is now medically clear.  Awaiting SNF / insurance authorization pending    Consultants:  None  Procedures:  Antimicrobials:  Anti-infectives (From admission, onward)    Start     Dose/Rate Route Frequency Ordered Stop   10/21/23 1000  cefTRIAXone  (ROCEPHIN ) 1 g in sodium chloride  0.9 % 100 mL IVPB  Status:  Discontinued  1 g 200 mL/hr over 30 Minutes Intravenous Every 24 hours 10/21/23 0110 10/23/23 1015   10/20/23 1230  cefTRIAXone  (ROCEPHIN ) 1 g in sodium chloride  0.9 % 100 mL IVPB        1 g 200 mL/hr over 30 Minutes Intravenous  Once 10/20/23  1220 10/20/23 1338      Subjective: Patient was seen and examined at bedside. Overnight events noted. Patient seems improved, following commands and responding appropriately. RN denies any other concerns.  Objective: Vitals:   11/09/23 0245 11/09/23 0520 11/09/23 0822 11/09/23 0823  BP:  (!) 149/63 137/73   Pulse:  66 88 86  Resp:  18 20 19   Temp:  98 F (36.7 C) (!) 97.3 F (36.3 C)   TempSrc:  Oral    SpO2:  96% 93% 93%  Weight: 68 kg     Height:        Intake/Output Summary (Last 24 hours) at 11/09/2023 1101 Last data filed at 11/08/2023 2023 Gross per 24 hour  Intake 106 ml  Output 0 ml  Net 106 ml    Filed Weights   11/06/23 2222 11/08/23 0400 11/09/23 0245  Weight: 65 kg 68 kg 68 kg    Examination:  General exam: Appears calm and comfortable, not in any acute distress. Respiratory system: CTA Bilaterally. Respiratory effort normal. RR 13 Cardiovascular system: S1 & S2 heard, RRR. No JVD, murmurs, rubs, gallops or clicks.  Gastrointestinal system: Abdomen is non distended, soft and non tender.  Normal bowel sounds heard. Central nervous system: Alert and oriented x 2. No focal neurological deficits. Extremities: No edema, no cyanosis, no clubbing. Skin: No rashes, lesions or ulcers Psychiatry: Judgement and insight appear normal. Mood & affect appropriate.     Data Reviewed: I have personally reviewed following labs and imaging studies  CBC: Recent Labs  Lab 11/05/23 0425  WBC 5.9  HGB 11.4*  HCT 38.3  MCV 76.0*  PLT 283   Basic Metabolic Panel: Recent Labs  Lab 11/05/23 0425  NA 141  K 3.4*  CL 108  CO2 25  GLUCOSE 116*  BUN 9  CREATININE 0.57  CALCIUM  9.2  MG 1.7   GFR: Estimated Creatinine Clearance: 61.8 mL/min (by C-G formula based on SCr of 0.57 mg/dL). Liver Function Tests: Recent Labs  Lab 11/05/23 0425  AST 20  ALT 25  ALKPHOS 129*  BILITOT 0.6  PROT 5.9*  ALBUMIN 2.5*   No results for input(s): LIPASE, AMYLASE  in the last 168 hours. No results for input(s): AMMONIA in the last 168 hours. Coagulation Profile: No results for input(s): INR, PROTIME in the last 168 hours. Cardiac Enzymes: No results for input(s): CKTOTAL, CKMB, CKMBINDEX, TROPONINI in the last 168 hours. BNP (last 3 results) No results for input(s): PROBNP in the last 8760 hours. HbA1C: No results for input(s): HGBA1C in the last 72 hours. CBG: Recent Labs  Lab 11/08/23 0909 11/08/23 1210 11/08/23 1658 11/08/23 2121 11/09/23 0851  GLUCAP 134* 210* 107* 142* 143*   Lipid Profile: No results for input(s): CHOL, HDL, LDLCALC, TRIG, CHOLHDL, LDLDIRECT in the last 72 hours. Thyroid Function Tests: No results for input(s): TSH, T4TOTAL, FREET4, T3FREE, THYROIDAB in the last 72 hours. Anemia Panel: No results for input(s): VITAMINB12, FOLATE, FERRITIN, TIBC, IRON , RETICCTPCT in the last 72 hours. Sepsis Labs: No results for input(s): PROCALCITON, LATICACIDVEN in the last 168 hours.  No results found for this or any previous visit (from the past 240 hours).   Radiology  Studies: No results found.  Scheduled Meds:  atorvastatin   40 mg Oral Daily   budesonide -glycopyrrolate -formoterol   2 puff Inhalation BID   vitamin B-12  500 mcg Oral Daily   enoxaparin  (LOVENOX ) injection  40 mg Subcutaneous Q24H   insulin  aspart  0-9 Units Subcutaneous TID WC   insulin  glargine-yfgn  15 Units Subcutaneous QHS   lisinopril   30 mg Oral QHS   Ensure Max Protein  11 oz Oral BID   QUEtiapine   50 mg Oral QHS   QUEtiapine   50 mg Oral q morning   sodium chloride  flush  3 mL Intravenous Q12H   Continuous Infusions:   LOS: 19 days    Time spent: 35 mins    Darcel Dawley, MD [facility]   If 7PM-7AM, please contact night-coverage

## 2023-11-09 NOTE — Plan of Care (Signed)
  Problem: Education: Goal: Ability to describe self-care measures that may prevent or decrease complications (Diabetes Survival Skills Education) will improve Outcome: Not Progressing Goal: Individualized Educational Video(s) Outcome: Not Progressing   Problem: Coping: Goal: Ability to adjust to condition or change in health will improve Outcome: Not Progressing   Problem: Fluid Volume: Goal: Ability to maintain a balanced intake and output will improve Outcome: Not Progressing   Problem: Health Behavior/Discharge Planning: Goal: Ability to identify and utilize available resources and services will improve Outcome: Not Progressing Goal: Ability to manage health-related needs will improve Outcome: Not Progressing   Problem: Metabolic: Goal: Ability to maintain appropriate glucose levels will improve Outcome: Not Progressing   Problem: Nutritional: Goal: Maintenance of adequate nutrition will improve Outcome: Not Progressing Goal: Progress toward achieving an optimal weight will improve Outcome: Not Progressing   Problem: Skin Integrity: Goal: Risk for impaired skin integrity will decrease Outcome: Not Progressing   Problem: Tissue Perfusion: Goal: Adequacy of tissue perfusion will improve Outcome: Not Progressing   Problem: Education: Goal: Knowledge of General Education information will improve Description: Including pain rating scale, medication(s)/side effects and non-pharmacologic comfort measures Outcome: Not Progressing   Problem: Health Behavior/Discharge Planning: Goal: Ability to manage health-related needs will improve Outcome: Not Progressing   Problem: Clinical Measurements: Goal: Ability to maintain clinical measurements within normal limits will improve Outcome: Not Progressing Goal: Will remain free from infection Outcome: Not Progressing Goal: Diagnostic test results will improve Outcome: Not Progressing Goal: Respiratory complications will  improve Outcome: Not Progressing Goal: Cardiovascular complication will be avoided Outcome: Not Progressing   Problem: Activity: Goal: Risk for activity intolerance will decrease Outcome: Not Progressing   Problem: Nutrition: Goal: Adequate nutrition will be maintained Outcome: Not Progressing   Problem: Coping: Goal: Level of anxiety will decrease Outcome: Not Progressing   Problem: Elimination: Goal: Will not experience complications related to bowel motility Outcome: Not Progressing Goal: Will not experience complications related to urinary retention Outcome: Not Progressing   Problem: Pain Managment: Goal: General experience of comfort will improve and/or be controlled Outcome: Not Progressing   Problem: Safety: Goal: Ability to remain free from injury will improve Outcome: Not Progressing   Problem: Skin Integrity: Goal: Risk for impaired skin integrity will decrease Outcome: Not Progressing   Problem: Safety: Goal: Non-violent Restraint(s) Outcome: Not Progressing

## 2023-11-10 DIAGNOSIS — Z515 Encounter for palliative care: Secondary | ICD-10-CM | POA: Diagnosis not present

## 2023-11-10 DIAGNOSIS — Z7189 Other specified counseling: Secondary | ICD-10-CM | POA: Diagnosis not present

## 2023-11-10 DIAGNOSIS — G9341 Metabolic encephalopathy: Secondary | ICD-10-CM | POA: Diagnosis not present

## 2023-11-10 LAB — GLUCOSE, CAPILLARY
Glucose-Capillary: 149 mg/dL — ABNORMAL HIGH (ref 70–99)
Glucose-Capillary: 228 mg/dL — ABNORMAL HIGH (ref 70–99)
Glucose-Capillary: 62 mg/dL — ABNORMAL LOW (ref 70–99)
Glucose-Capillary: 82 mg/dL (ref 70–99)
Glucose-Capillary: 90 mg/dL (ref 70–99)
Glucose-Capillary: 129 mg/dL — ABNORMAL HIGH (ref 70–99)
Glucose-Capillary: 237 mg/dL — ABNORMAL HIGH (ref 70–99)

## 2023-11-10 LAB — BASIC METABOLIC PANEL WITH GFR
Anion gap: 12 (ref 5–15)
BUN: 12 mg/dL (ref 8–23)
CO2: 23 mmol/L (ref 22–32)
Calcium: 9.4 mg/dL (ref 8.9–10.3)
Chloride: 107 mmol/L (ref 98–111)
Creatinine, Ser: 0.53 mg/dL (ref 0.44–1.00)
GFR, Estimated: >60 mL/min (ref >60)
Glucose, Bld: 130 mg/dL — ABNORMAL HIGH (ref 70–99)
Potassium: 3.5 mmol/L (ref 3.5–5.1)
Sodium: 142 mmol/L (ref 135–145)

## 2023-11-10 LAB — CBC
HCT: 42.8 % (ref 36.0–46.0)
Hemoglobin: 12.7 g/dL (ref 12.0–15.0)
MCH: 22.6 pg — ABNORMAL LOW (ref 26.0–34.0)
MCHC: 29.7 g/dL — ABNORMAL LOW (ref 30.0–36.0)
MCV: 76.3 fL — ABNORMAL LOW (ref 80.0–100.0)
Platelets: 316 K/uL (ref 150–400)
RBC: 5.61 MIL/uL — ABNORMAL HIGH (ref 3.87–5.11)
RDW: 21.8 % — ABNORMAL HIGH (ref 11.5–15.5)
WBC: 5.9 K/uL (ref 4.0–10.5)
nRBC: 0 % (ref 0.0–0.2)

## 2023-11-10 LAB — PHOSPHORUS: Phosphorus: 3.9 mg/dL (ref 2.5–4.6)

## 2023-11-10 LAB — MAGNESIUM: Magnesium: 1.6 mg/dL — ABNORMAL LOW (ref 1.7–2.4)

## 2023-11-10 MED ORDER — MAGNESIUM SULFATE 2 GM/50ML IV SOLN
2.0000 g | Freq: Once | INTRAVENOUS | Status: AC
  Administered 2023-11-10: 2 g via INTRAVENOUS
  Filled 2023-11-10: qty 50

## 2023-11-10 NOTE — TOC Progression Note (Signed)
 Transition of Care [facility]) - Progression Note    Patient Details  Name: Allison Hughes MRN: [id number] Date of Birth: Mar 04, 1950  Transition of Care [facility]) CM/SW Contact  [name] Clack, [place] Phone Number: 11/10/2023, 3:13 PM  Clinical Narrative:     CSW messaged Madelin Jacobus regarding patient's insurance auth approval and anticipated discharge. Patient was given haldol  last night and will not be able to discharge today. Provider made aware. Patient will also need to be without a telesitter; the order has been discontinued. CSW will continue to be in contact with Tammy and will update dc plan.   Expected Discharge Plan: Skilled Nursing Facility Barriers to Discharge: Continued Medical Work up, SNF Pending bed offer               Expected Discharge Plan and Services                                               Social Drivers of Health (SDOH) Interventions SDOH Screenings   Food Insecurity: No Food Insecurity (11/06/2023)  Housing: Low Risk  (11/06/2023)  Transportation Needs: No Transportation Needs (11/06/2023)  Utilities: Not At Risk (11/06/2023)  Depression (PHQ2-9): Low Risk  (10/26/2020)  Social Connections: Patient Unable To Answer (10/20/2023)  Tobacco Use: High Risk (10/20/2023)    Readmission Risk Interventions    10/24/2023    2:14 PM  Readmission Risk Prevention Plan  Transportation Screening Complete  PCP or Specialist Appt within 3-5 Days Complete  HRI or Home Care Consult Complete  Social Work Consult for Recovery Care Planning/Counseling Complete  Palliative Care Screening Complete  Medication Review Oceanographer) Referral to Pharmacy

## 2023-11-10 NOTE — Progress Notes (Signed)
 At 1726 this patient was noted to have repeat BG of 90. Dinner tray has arrived and she is encouraged to eat she is resistant. Encouraged to finish Ensure and milk drink. Will continue to monitor.

## 2023-11-10 NOTE — Progress Notes (Signed)
 At 1615 this patient was noted to have hypoglycemia blood glucose noted to be 62.Given apple sauce and ginger ale. Will continue to monitor.

## 2023-11-10 NOTE — Progress Notes (Signed)
 Physical Therapy Treatment Patient Details Name: Allison Hughes MRN: [id number] DOB: 1950/11/06 Today's Date: 11/10/2023   History of Present Illness 73 yo female found down on floor 10/6. Recent hospitalization 9/5-9/10 found on floor hypoxic with PNA and UTI d/c to [facility].  Currently pt with confusion, paranoid thoughts and forgetfulness. Found to have COVID and acute metabolic encephalopathy. PMH HTN HLD CVA COPD DM2 GIB, Obesity    PT Comments  RN in room on entry having patient fold washcloths for her. Pt distracted when PT entered room, and when asked reports she would like to go for a walk. Pt is able to come to side of bed with supervision, and stands with modA. PT encourages pt to walk to door and back but pt reports she would rather dance. So PT led pt in stepping forwards and backwards to simulate dancing. Pt easily fatigued and sits back down on the bed. Pt is min A for returning to supine. Pt set up folding washcloths at end of session.     If plan is discharge home, recommend the following: A lot of help with walking and/or transfers;A lot of help with bathing/dressing/bathroom;Assistance with cooking/housework;Direct supervision/assist for medications management;Direct supervision/assist for financial management;Assist for transportation;Assistance with feeding;Help with stairs or ramp for entrance;Supervision due to cognitive status   Can travel by private vehicle     Yes  Equipment Recommendations  Wheelchair (measurements PT);Wheelchair cushion (measurements PT)       Precautions / Restrictions Precautions Precautions: Fall Recall of Precautions/Restrictions: Impaired Precaution/Restrictions Comments: AMS,  watch SpO2 Restrictions Weight Bearing Restrictions Per Provider Order: No     Mobility  Bed Mobility Overal bed mobility: Needs Assistance Bed Mobility: Supine to Sit, Sit to Supine     Supine to sit: HOB elevated, Supervision Sit to supine:  HOB elevated, Supervision   General bed mobility comments: high visual cues for coming to seated EoB, and for return to supine    Transfers Overall transfer level: Needs assistance Equipment used: 1 person hand held assist Transfers: Sit to/from Stand Sit to Stand: Mod assist           General transfer comment: pt modA for power up from bed, asked pt to walk to door but pt preferred to dance at the EoB. pt with decreased balance and eventually sits back down        Balance Overall balance assessment: Needs assistance Sitting-balance support: Feet supported, No upper extremity supported Sitting balance-Leahy Scale: Good     Standing balance support: Bilateral upper extremity supported, During functional activity Standing balance-Leahy Scale: Poor Standing balance comment: standing with PT needs BUE support                            Communication Communication Communication: Impaired (per prior notes) Factors Affecting Communication: Hearing impaired  Cognition Arousal: Alert Behavior During Therapy: Flat affect   PT - Cognitive impairments: No family/caregiver present to determine baseline, History of cognitive impairments, Orientation, Attention, Sequencing, Awareness   Orientation impairments: Place, Time, Situation                   PT - Cognition Comments: continues to hold nonsense conversations Following commands: Impaired Following commands impaired: Follows one step commands inconsistently, Follows one step commands with increased time    Cueing Cueing Techniques: Verbal cues, Gestural cues, Tactile cues, Visual cues  Exercises General Exercises - Lower Extremity Ankle Circles/Pumps: AROM, Both, 5  reps Heel Slides: AROM, Both, 5 reps, Seated Hip Flexion/Marching: AROM, Both, 5 reps, Seated    General Comments General comments (skin integrity, edema, etc.): VSS on [place] 3L O2      Pertinent Vitals/Pain Pain Assessment Pain Assessment:  No/denies pain     PT Goals (current goals can now be found in the care plan section) Acute Rehab PT Goals PT Goal Formulation: With patient Time For Goal Achievement: 11/04/23 Potential to Achieve Goals: Fair Progress towards PT goals: Progressing toward goals    Frequency    Min 2X/week       AM-PAC PT 6 Clicks Mobility   Outcome Measure  Help needed turning from your back to your side while in a flat bed without using bedrails?: A Little Help needed moving from lying on your back to sitting on the side of a flat bed without using bedrails?: A Little Help needed moving to and from a bed to a chair (including a wheelchair)?: A Lot Help needed standing up from a chair using your arms (e.g., wheelchair or bedside chair)?: Total Help needed to walk in hospital room?: Total Help needed climbing 3-5 steps with a railing? : Total 6 Click Score: 11    End of Session   Activity Tolerance: Patient tolerated treatment well Patient left: in bed;with call bell/phone within reach;with bed alarm set Nurse Communication: Mobility status PT Visit Diagnosis: Muscle weakness (generalized) (M62.81);Repeated falls (R29.6);History of falling (Z91.81);Difficulty in walking, not elsewhere classified (R26.2);Pain;Unsteadiness on feet (R26.81);Other abnormalities of gait and mobility (R26.89) Pain - Right/Left: Left Pain - part of body: Ankle and joints of foot     Time: 1346-1405 PT Time Calculation (min) (ACUTE ONLY): 19 min  Charges:    $Therapeutic Activity: 8-22 mins PT General Charges $$ ACUTE PT VISIT: 1 Visit                     Waldine Zenz B. Fleeta Lapidus PT, DPT Acute Rehabilitation Services Please use secure chat or  Call Office [phone]    Almarie KATHEE Fleeta Fleet 11/10/2023, 3:24 PM

## 2023-11-10 NOTE — Progress Notes (Signed)
 PROGRESS NOTE    Allison Hughes  FMW:[id number] DOB: 12/27/1950 DOA: 10/20/2023 PCP: Valma Carwin, MD   Brief Narrative:  This 73 yrs old female with PMH significant for hypertension, hyperlipidemia, CVA, COPD, diabetes mellitus type 2, GI bleed, neuroendocrine ulcer, and obesity who presents after being found on the floor confused.  She is being treated for acute metabolic encephalopathy in the setting of urinary tract infection, acute hypoxic respiratory failure secondary to COVID-19 infection and electrolytes which have been replaced. Further hospital course and management as outlined below.   Patient medically stable for discharge to SNF pending insurance authorization, having completed required 10 days isolation for covid.  Assessment & Plan:   Active Problems:   Chronic diastolic CHF (congestive heart failure) (HCC)   Fall at home, initial encounter   Essential hypertension   DM (diabetes mellitus), type 2 (HCC)   Hyperlipidemia   History of stroke   History of gastric ulcer   Acute Metabolic encephalopathy: > Improved. In the setting of baseline cognitive impairment.   Mentation now around baseline per Family. They reports her confusion waxes and wanes. CT head is negative for acute intracranial abnormality.  MRI brain showing No acute intracranial abnormality. Old left cerebellar infarct.  EEG did not show any seizures.  Suspect made worse from UTI. She has finished the course of IV ceftriaxone .  -Staff reports pt has been agitated during the day on a daily basis.  -Continue bedtime seroquel  with added QAM dose as well. Increase AM dose to 50mg . Cont 50mg  at bedtime dose. -Continue  PRN haldol  to 5mg  q6h PRN -Restraints as needed for patient's safety. -Insurance authorization approved but patient needs to be off as needed psychotropics for 24 hours for discharge.  Acute hypoxic respiratory failure secondary to COVID: Positive for COVID-19 infection. Now weaned down  to room air. -Continue bronchodilators as needed. -Off isolation after 10 days after COVID diagnosis.   Left foot and ankle pain and some swelling on the dorsum of the foot: X rays of the left foot showed evidence of soft tissue swelling without any acute fractures. PT/ OT Evaluation > SNF. Continue Tylenol  PRN.   Hypokalemia and hypomagnesemia Replaced.  Continue to monitor   Hospital acquired delirium superimposed on baseline cognitive impairment: Continue Frequent reorientation. Started on seroquel  25 mg at bedtime on 10/9, increased to 50 mg - continue at bedtime.  History of insomnia: Stopped trazodone for seroquel  as above. PCP follow up   H/o CVA : Continue with PT and OT On statin, not on antiplatelet (defer to PCP)   GERD: Continue PPI.   Hyperlipidemia: Continue statin .   Controlled type 2 DM : Hb A1c is 6.9% --Resume metformin  and Tresiba . --PCP follow up. --Monitor sugars   Obesity: Body mass index is 33.1 kg/m. Complicates overall care and prognosis.   Recommend lifestyle modifications including physical activity and diet for weight loss and overall long-term health.     DVT prophylaxis: Lovenox  Code Status: Full code Family Communication: No family at bed side. Disposition Plan:    Status is: Inpatient Remains inpatient appropriate because: Patient is now medically clear.  Awaiting SNF / insurance authorization pending    Consultants:  None  Procedures:  Antimicrobials:  Anti-infectives (From admission, onward)    Start     Dose/Rate Route Frequency Ordered Stop   10/21/23 1000  cefTRIAXone  (ROCEPHIN ) 1 g in sodium chloride  0.9 % 100 mL IVPB  Status:  Discontinued  1 g 200 mL/hr over 30 Minutes Intravenous Every 24 hours 10/21/23 0110 10/23/23 1015   10/20/23 1230  cefTRIAXone  (ROCEPHIN ) 1 g in sodium chloride  0.9 % 100 mL IVPB        1 g 200 mL/hr over 30 Minutes Intravenous  Once 10/20/23 1220 10/20/23 1338       Subjective: Patient was seen and examined at bedside. Overnight events noted. Patient seems improved, following commands and responding appropriately. RN denies any other concerns.  Patient was agitated requiring Ativan last night.  Objective: Vitals:   11/10/23 0543 11/10/23 0851 11/10/23 0856 11/10/23 1239  BP: 124/64 111/78 111/78 (!) 106/59  Pulse: 84 (!) 105 (!) 105 89  Resp: 17     Temp: 97.9 F (36.6 C) 98.9 F (37.2 C) 98.9 F (37.2 C) 98.2 F (36.8 C)  TempSrc: Axillary  Oral Oral  SpO2: 100% 96% 96% 99%  Weight:      Height:        Intake/Output Summary (Last 24 hours) at 11/10/2023 1307 Last data filed at 11/09/2023 2018 Gross per 24 hour  Intake 106 ml  Output 0 ml  Net 106 ml    Filed Weights   11/08/23 0400 11/09/23 0245 11/10/23 0227  Weight: 68 kg 68 kg 68 kg    Examination:  General exam: Appears calm and comfortable, not in any acute distress. Respiratory system: CTA Bilaterally. Respiratory effort normal. RR 13 Cardiovascular system: S1 & S2 heard, RRR. No JVD, murmurs, rubs, gallops or clicks.  Gastrointestinal system: Abdomen is non distended, soft and non tender.  Normal bowel sounds heard. Central nervous system: Alert and oriented x 2. No focal neurological deficits. Extremities: No edema, no cyanosis, no clubbing. Skin: No rashes, lesions or ulcers Psychiatry: Judgement and insight appear normal. Mood & affect appropriate.     Data Reviewed: I have personally reviewed following labs and imaging studies  CBC: Recent Labs  Lab 11/05/23 0425 11/10/23 0443  WBC 5.9 5.9  HGB 11.4* 12.7  HCT 38.3 42.8  MCV 76.0* 76.3*  PLT 283 316   Basic Metabolic Panel: Recent Labs  Lab 11/05/23 0425 11/10/23 0443  NA 141 142  K 3.4* 3.5  CL 108 107  CO2 25 23  GLUCOSE 116* 130*  BUN 9 12  CREATININE 0.57 0.53  CALCIUM  9.2 9.4  MG 1.7 1.6*  PHOS  --  3.9   GFR: Estimated Creatinine Clearance: 61.8 mL/min (by C-G formula based on  SCr of 0.53 mg/dL). Liver Function Tests: Recent Labs  Lab 11/05/23 0425  AST 20  ALT 25  ALKPHOS 129*  BILITOT 0.6  PROT 5.9*  ALBUMIN 2.5*   No results for input(s): LIPASE, AMYLASE in the last 168 hours. No results for input(s): AMMONIA in the last 168 hours. Coagulation Profile: No results for input(s): INR, PROTIME in the last 168 hours. Cardiac Enzymes: No results for input(s): CKTOTAL, CKMB, CKMBINDEX, TROPONINI in the last 168 hours. BNP (last 3 results) No results for input(s): PROBNP in the last 8760 hours. HbA1C: No results for input(s): HGBA1C in the last 72 hours. CBG: Recent Labs  Lab 11/09/23 1214 11/09/23 1617 11/09/23 1937 11/10/23 0847 11/10/23 1148  GLUCAP 177* 179* 142* 149* 228*   Lipid Profile: No results for input(s): CHOL, HDL, LDLCALC, TRIG, CHOLHDL, LDLDIRECT in the last 72 hours. Thyroid Function Tests: No results for input(s): TSH, T4TOTAL, FREET4, T3FREE, THYROIDAB in the last 72 hours. Anemia Panel: No results for input(s): VITAMINB12, FOLATE, FERRITIN, TIBC,  IRON , RETICCTPCT in the last 72 hours. Sepsis Labs: No results for input(s): PROCALCITON, LATICACIDVEN in the last 168 hours.  No results found for this or any previous visit (from the past 240 hours).   Radiology Studies: No results found.  Scheduled Meds:  atorvastatin   40 mg Oral Daily   budesonide -glycopyrrolate -formoterol   2 puff Inhalation BID   vitamin B-12  500 mcg Oral Daily   enoxaparin  (LOVENOX ) injection  40 mg Subcutaneous Q24H   insulin  aspart  0-9 Units Subcutaneous TID WC   insulin  glargine-yfgn  15 Units Subcutaneous QHS   lisinopril   30 mg Oral QHS   Ensure Max Protein  11 oz Oral BID   QUEtiapine   50 mg Oral QHS   QUEtiapine   50 mg Oral q morning   sodium chloride  flush  3 mL Intravenous Q12H   Continuous Infusions:   LOS: 20 days    Time spent: 35 mins    Darcel Dawley, MD [facility]   If 7PM-7AM, please contact night-coverage

## 2023-11-10 NOTE — Progress Notes (Signed)
 Daily Progress Note   Patient Name: Allison Hughes       Date: 11/10/2023 DOB: [birth year]  Age: 73 y.o. MRN#: [id number] Attending Physician: Leotis Bogus, MD Primary Care Physician: Valma Carwin, MD Admit Date: 10/20/2023  Reason for Consultation/Follow-up: Establishing goals of care  Length of Stay: 20  Current Medications: Scheduled Meds:   atorvastatin   40 mg Oral Daily   budesonide -glycopyrrolate -formoterol   2 puff Inhalation BID   vitamin B-12  500 mcg Oral Daily   enoxaparin  (LOVENOX ) injection  40 mg Subcutaneous Q24H   insulin  aspart  0-9 Units Subcutaneous TID WC   insulin  glargine-yfgn  15 Units Subcutaneous QHS   lisinopril   30 mg Oral QHS   Ensure Max Protein  11 oz Oral BID   QUEtiapine   50 mg Oral QHS   QUEtiapine   50 mg Oral q morning   sodium chloride  flush  3 mL Intravenous Q12H    Continuous Infusions:    PRN Meds: acetaminophen  **OR** acetaminophen , albuterol , ALPRAZolam , haloperidol  lactate, LORazepam, ondansetron  **OR** ondansetron  (ZOFRAN ) IV  Physical Exam Vitals reviewed.  Constitutional:      General: She is sleeping. She is not in acute distress. HENT:     Head: Normocephalic and atraumatic.  Cardiovascular:     Rate and Rhythm: Normal rate.  Pulmonary:     Effort: Pulmonary effort is normal.  Skin:    Findings: Bruising: left foot.             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 She is being treated for acute metabolic encephalopathy in the setting of UTI and acute hypoxic respiratory failure secondary to Covid-19 infection.   Today's Discussion: Reviewed chart and received update from nursing. Plan remains for patient to discharge to SNF once she has been without tele sitter for 48 hours and chemical restraints for 24 hours. Tele sitter discontinued today at 11:20 am.  Patient sleeping in no apparent distress. She does not easily awaken when I call her name so I let her sleep. No family at bedside.  Spoke to patient's friend [name] by phone. He is aware of the plan for SNF-- hopefully 10/29. He is concerned about her confusion and how long it may linger. We discussed hospital delirium and dementia. PMT will continue to follow peripherally.   Emotional support and therapeutic listening provided. Encouraged Phil to reach out with questions or concerns.  Recommendations/Plan: Full code Full scope SNF at discharge PMT to support peripherally- please reach out to PMT with needs    Code Status:    Code Status Orders  (From admission, onward)           Start     Ordered   10/20/23 1617  Full code  Continuous       Question:  By:  Answer:  Consent: discussion  documented in EHR   10/20/23 1616         Extensive chart review has been completed prior to seeing the patient including labs, vital signs, imaging, progress/consult notes, orders, medications, and available advance directive documents.  Time spent: 25 minutes   Thank you for allowing the Palliative Medicine Team to assist in the care of this patient.    Stephane CHRISTELLA Palin, NP  Please contact Palliative Medicine Team phone at [phone] for questions and concerns.

## 2023-11-10 NOTE — Plan of Care (Signed)

## 2023-11-10 NOTE — Progress Notes (Signed)
 Patient was assessed through the night and noted to be calm, resting comfortably through the shift. No need to use any prn anxiety medicines. Last time a prn anxiety medicine was used on this patient was back on 10/25 @ 2113. Provider was made aware. Will continue to monitor.

## 2023-11-10 NOTE — Progress Notes (Signed)
 Discontinued tele sitter at 11:20 am. Patient and family member informed .

## 2023-11-11 DIAGNOSIS — G9341 Metabolic encephalopathy: Secondary | ICD-10-CM | POA: Diagnosis not present

## 2023-11-11 LAB — MAGNESIUM: Magnesium: 1.8 mg/dL (ref 1.7–2.4)

## 2023-11-11 LAB — GLUCOSE, CAPILLARY
Glucose-Capillary: 199 mg/dL — ABNORMAL HIGH (ref 70–99)
Glucose-Capillary: 196 mg/dL — ABNORMAL HIGH (ref 70–99)

## 2023-11-11 MED ORDER — QUETIAPINE FUMARATE 50 MG PO TABS: 50.0000 mg | ORAL_TABLET | Freq: Every morning | ORAL | 0 refills | Status: DC

## 2023-11-11 NOTE — Plan of Care (Signed)

## 2023-11-11 NOTE — Discharge Summary (Signed)
 Physician Discharge Summary  Allison Hughes FMW:[id number] DOB: February 21, 1950 DOA: 10/20/2023  PCP: Valma Carwin, MD  Admit date: 10/20/2023  Discharge date: 11/11/2023  Admitted From: [facility]  Disposition:  [facility]  Recommendations for Outpatient Follow-up:  Follow up with PCP in 1-2 weeks. Please obtain BMP/CBC in one week. Advised to take Seroquel  50 mg daily. Patient is being discharged to skilled nursing facility for rehab.  Home Health: None Equipment/Devices:None  Discharge Condition: Stable CODE STATUS:Full code Diet recommendation: Heart Healthy  Brief [facility] Course: This 73 yrs old female with PMH significant for hypertension, hyperlipidemia, CVA, COPD, diabetes mellitus type 2, Hx. of GI bleed, neuroendocrine ulcer, and obesity who presents after being found on the floor confused.  She is being treated for acute metabolic encephalopathy in the setting of urinary tract infection, acute hypoxic respiratory failure secondary to COVID-19 infection and electrolytes which have been replaced.  Patient has made significant improvement. Patient is now medically stable for discharge to SNF, having completed required 10 days isolation for covid.  Patient being discharged to SNF after insurance authorization approved.  Discharge Diagnoses:  Active Problems:   Chronic diastolic CHF (congestive heart failure) (HCC)   Fall at home, initial encounter   Essential hypertension   DM (diabetes mellitus), type 2 (HCC)   Hyperlipidemia   History of stroke   History of gastric ulcer  Acute Metabolic encephalopathy: > Improved. In the setting of baseline cognitive impairment.   Mentation now around baseline per Family. They reports her confusion waxes and wanes. CT head is negative for acute intracranial abnormality.  MRI brain showing No acute intracranial abnormality. Old left cerebellar infarct.  EEG did not show any seizures.  Suspect made worse from UTI. She has  finished the course of IV ceftriaxone .  -Staff reports pt has been agitated during the day on a daily basis.  -Continue bedtime seroquel  with added QAM dose as well. Increase AM dose to 50mg . Cont 50mg  at bedtime dose. -Continue  PRN haldol  to 5mg  q6h PRN -Restraints as needed for patient's safety.   Acute hypoxic respiratory failure secondary to COVID: Positive for COVID-19 infection. Now weaned down to room air. -Continue bronchodilators as needed. -Off isolation after 10 days after COVID diagnosis.   Left foot and ankle pain and some swelling on the dorsum of the foot: X rays of the left foot showed evidence of soft tissue swelling without any acute fractures. PT/ OT Evaluation > SNF. Continue Tylenol  PRN.   Hypokalemia and hypomagnesemia Replaced.  Continue to monitor   Hospital acquired delirium superimposed on baseline cognitive impairment: Continue Frequent reorientation. Started on seroquel  25 mg at bedtime on 10/9, increased to 50 mg - continue at bedtime.   History of insomnia: Stopped trazodone for seroquel  as above. PCP follow up   H/o CVA : Continue with PT and OT On statin, not on antiplatelet (defer to PCP)   GERD: Continue PPI.   Hyperlipidemia: Continue statin .   Controlled type 2 DM : Hb A1c is 6.9% --Resume metformin  and Tresiba . --PCP follow up. --Monitor sugars   Obesity: Body mass index is 33.1 kg/m. Complicates overall care and prognosis.   Recommend lifestyle modifications including physical activity and diet for weight loss and overall long-term health.  Discharge Instructions  Discharge Instructions     Call MD for:  difficulty breathing, headache or visual disturbances   Complete by: As directed    Call MD for:  extreme fatigue   Complete by: As  directed    Call MD for:  persistant dizziness or light-headedness   Complete by: As directed    Call MD for:  persistant dizziness or light-headedness   Complete by: As directed     Call MD for:  persistant nausea and vomiting   Complete by: As directed    Call MD for:  persistant nausea and vomiting   Complete by: As directed    Call MD for:  severe uncontrolled pain   Complete by: As directed    Call MD for:  temperature >100.4   Complete by: As directed    Diet - low sodium heart healthy   Complete by: As directed    Diet Carb Modified   Complete by: As directed    Diet general   Complete by: As directed    Discharge instructions   Complete by: As directed    Continue medications as prescribed. Please follow up with Primary Care in 1-2 weeks.  Trazodone was changed to Seroquel  for insomnia and dementia-related behaviors.  Please follow up on efficacy and tolerance at follow up.   Discharge instructions   Complete by: As directed    Advised to follow-up with primary care physician in 1 week. Advised to take Seroquel  50 mg daily. Patient is being discharged to skilled nursing facility for rehab.   Increase activity slowly   Complete by: As directed    Increase activity slowly   Complete by: As directed       Allergies as of 11/11/2023       Reactions   Iodine Swelling   Shrimp [shellfish Allergy] Swelling   Fresh shrimp        Medication List     STOP taking these medications    Breztri  Aerosphere 160-9-4.8 MCG/ACT Aero inhaler Generic drug: budesonide -glycopyrrolate -formoterol    fosfomycin 3 g Pack Commonly known as: MONUROL   linezolid 600 MG tablet Commonly known as: ZYVOX   traZODone 50 MG tablet Commonly known as: DESYREL       TAKE these medications    acetaminophen  500 MG tablet Commonly known as: TYLENOL  Take 2 tablets (1,000 mg total) by mouth every 8 (eight) hours as needed for mild pain (pain score 1-3) or headache.   albuterol  108 (90 Base) MCG/ACT inhaler Commonly known as: VENTOLIN  HFA Inhale 2 puffs into the lungs every 6 (six) hours as needed for wheezing or shortness of breath.   ALPRAZolam  0.25 MG  tablet Commonly known as: XANAX  Take 1 tablet (0.25 mg total) by mouth 2 (two) times daily as needed for anxiety.   atorvastatin  40 MG tablet Commonly known as: LIPITOR Take 40 mg by mouth daily.   cyanocobalamin  500 MCG tablet Commonly known as: VITAMIN B12 Take 1 tablet (500 mcg total) by mouth daily.   Ensure Max Protein Liqd Take 330 mLs (11 oz total) by mouth 2 (two) times daily.   lisinopril  30 MG tablet Commonly known as: ZESTRIL  Take 1 tablet (30 mg total) by mouth at bedtime. What changed:  medication strength how much to take   metFORMIN  500 MG 24 hr tablet Commonly known as: GLUCOPHAGE -XR Take 500 mg by mouth at bedtime.   pantoprazole  40 MG tablet Commonly known as: PROTONIX  Take 1 tablet (40 mg total) by mouth 2 (two) times daily for 30 days, THEN 1 tablet (40 mg total) daily. Start taking on: September 23, 2023   potassium chloride  SA 20 MEQ tablet Commonly known as: KLOR-CON  M Take 1 tablet (20 mEq total)  by mouth daily.   QUEtiapine  50 MG tablet Commonly known as: SEROQUEL  Take 1 tablet (50 mg total) by mouth at bedtime.   QUEtiapine  50 MG tablet Commonly known as: SEROQUEL  Take 1 tablet (50 mg total) by mouth every morning. Start taking on: November 12, 2023   Slow-Mag 71.5-119 MG Tbec SR tablet Generic drug: magnesium  chloride Take 1 tablet (64 mg total) by mouth daily.   Trelegy Ellipta 100-62.5-25 MCG/ACT Aepb Generic drug: Fluticasone -Umeclidin-Vilant Inhale 1 puff into the lungs daily at 12 noon.   Tresiba  100 UNIT/ML Soln Generic drug: Insulin  Degludec Inject 15 Units into the skin at bedtime.        Contact information for after-discharge care     Destination     [facility] .   Service: Skilled Nursing Contact information: [address] [place]  [address] [phone]                    Allergies  Allergen Reactions   Iodine Swelling   Shrimp [Shellfish Allergy] Swelling    Fresh shrimp     Consultations: None   Procedures/Studies: CUP PACEART REMOTE DEVICE CHECK Result Date: 10/22/2023 ILR summary report received. Battery status OK. Normal device function. No new symptom, tachy, brady, or pause episodes. No new AF episodes. Monthly summary reports and ROV/PRN ML, CVRS  DG Foot Complete Left Result Date: 10/21/2023 CLINICAL DATA:  Left foot swelling.  No reported injury. EXAM: LEFT FOOT - COMPLETE 3+ VIEW COMPARISON:  None Available. FINDINGS: Mild dorsal and distal soft tissue swelling. No underlying bony abnormality. IMPRESSION: Mild soft tissue swelling. Electronically Signed   By: Elspeth Bathe M.D.   On: 10/21/2023 15:19   EEG adult Result Date: 10/21/2023 Shelton Arlin KIDD, MD     10/21/2023  1:18 PM Patient Name: Allison Hughes MRN: [id number] Epilepsy Attending: Arlin KIDD Shelton Referring Physician/Provider: Cherlyn Labella, MD Date: 10/21/2023 Duration: 22.50 mins Patient history: 73yo M with ams. EEG to evaluate for seizure Level of alertness: Awake, asleep AEDs during EEG study: None Technical aspects: This EEG study was done with scalp electrodes positioned according to the 10-20 International system of electrode placement. Electrical activity was reviewed with band pass filter of 1-70Hz , sensitivity of 7 uV/mm, display speed of 28mm/sec with a 60Hz  notched filter applied as appropriate. EEG data were recorded continuously and digitally stored.  Video monitoring was available and reviewed as appropriate. Description: The posterior dominant rhythm consists of 7.5 Hz activity of moderate voltage (25-35 uV) seen predominantly in posterior head regions, symmetric and reactive to eye opening and eye closing. Sleep was characterized by vertex waves, sleep spindles (12 to 14 Hz), maximal frontocentral region. Hyperventilation and photic stimulation were not performed.   IMPRESSION: This study is within normal limits. No seizures or epileptiform discharges were seen throughout the  recording. A normal interictal EEG does not exclude the diagnosis of epilepsy. Arlin KIDD Shelton   MR BRAIN WO CONTRAST Result Date: 10/20/2023 EXAM: MRI BRAIN WITHOUT CONTRAST 10/20/2023 09:20:29 PM TECHNIQUE: Multiplanar multisequence MRI of the head/brain was performed without the administration of intravenous contrast. COMPARISON: 06/04/2022 CLINICAL HISTORY: Stroke, follow up. Altered mental status, stroke, weakness. FINDINGS: BRAIN AND VENTRICLES: No acute infarct. No intracranial hemorrhage. No mass. No midline shift. No hydrocephalus. Old Left cerebellar infarct. Early confluent hyperintense T2-weighted signal within the cerebral white matter, most commonly due to chronic small vessel disease. Generalized volume loss. The sella is unremarkable. Normal flow voids. ORBITS: No acute abnormality. SINUSES  AND MASTOIDS: No acute abnormality. BONES AND SOFT TISSUES: Normal marrow signal. No acute soft tissue abnormality. IMPRESSION: 1. No acute intracranial abnormality. 2. Old left cerebellar infarct. 3. Early confluent white matter T2 hyperintensity consistent with chronic small vessel disease. 4. Generalized cerebral volume loss. Electronically signed by: Franky Stanford MD 10/20/2023 11:29 PM EDT RP Workstation: [id number]   DG Abd 1 View Result Date: 10/20/2023 CLINICAL DATA:  Evaluate for metallic foreign body EXAM: ABDOMEN - 1 VIEW COMPARISON:  None Available. FINDINGS: Scattered large and small bowel gas is noted. No abnormal mass or abnormal calcifications are noted. Loop recorder is noted in the anterior left chest wall. No other metallic foreign body is seen. IMPRESSION: No foreign body within the abdomen. Loop recorder is noted left chest. Electronically Signed   By: Oneil Devonshire M.D.   On: 10/20/2023 21:20   CT Cervical Spine Wo Contrast Result Date: 10/20/2023 EXAM: CT CERVICAL SPINE WITHOUT CONTRAST 10/20/2023 10:54:35 AM TECHNIQUE: CT of the cervical spine was performed without the  administration of intravenous contrast. Multiplanar reformatted images are provided for review. Automated exposure control, iterative reconstruction, and/or weight based adjustment of the mA/kV was utilized to reduce the radiation dose to as low as reasonably achievable. COMPARISON: None available. CLINICAL HISTORY: Neck trauma (Age >= 65y). Mental status change, unknown cause. FINDINGS: CERVICAL SPINE: BONES AND ALIGNMENT: No acute fracture or traumatic malalignment. DEGENERATIVE CHANGES: Moderate chronic degenerative disc disease at C4-C5 and C5-C6, with posterior endplate ridging causing moderate central spinal canal stenosis at C5-C6 with severe bilateral neuroforaminal stenosis. There is also moderate central spinal canal stenosis and moderate-to-severe bilateral neural foraminal stenosis at C6-C7. SOFT TISSUES: No prevertebral soft tissue swelling. There is calcification within the carotid bulbs. There is mild emphysema within the lung apices. IMPRESSION: 1. No acute abnormality of the cervical spine related to the reported neck trauma. 2. Moderate chronic degenerative disc disease at C4-5 and C5-6, with associated moderate central spinal canal stenosis at C5-6 and severe bilateral neuroforaminal stenosis. 3. Moderate central spinal canal stenosis and moderate-to-severe bilateral neural foraminal stenosis at C6-7. Electronically signed by: Evalene Coho MD 10/20/2023 11:09 AM EDT RP Workstation: [id number]   CT Head Wo Contrast Result Date: 10/20/2023 EXAM: CT HEAD WITHOUT CONTRAST 10/20/2023 10:54:35 AM TECHNIQUE: CT of the head was performed without the administration of intravenous contrast. Automated exposure control, iterative reconstruction, and/or weight based adjustment of the mA/kV was utilized to reduce the radiation dose to as low as reasonably achievable. COMPARISON: CT of the head dated 09/19/2023. CLINICAL HISTORY: Mental status change, unknown cause. FINDINGS: BRAIN AND VENTRICLES: No  acute hemorrhage. No evidence of acute infarct. No hydrocephalus. No extra-axial collection. No mass effect or midline shift. There is moderate generalized cerebral volume loss. There are chronic encephalomalacia changes again noted within the right parietal lobe. Focal encephalomalacia changes are also again demonstrated in the left cerebellar hemisphere. There is moderately advanced diffuse cerebral white matter disease. Senescent calcifications are noted within the basal ganglia. There is moderate calcification within the carotid siphons. ORBITS: No acute abnormality. SINUSES: No acute abnormality. SOFT TISSUES AND SKULL: No acute soft tissue abnormality. No skull fracture. IMPRESSION: 1. No acute intracranial abnormality. 2. Moderate generalized cerebral volume loss and moderately advanced diffuse cerebral white matter disease. 3. 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 Electronically signed by: Evalene Coho MD 10/20/2023 11:06 AM EDT RP Workstation: [id number]   DG Chest Portable 1 View Result Date: 10/20/2023 EXAM: 1 VIEW XRAY OF  THE CHEST 10/20/2023 10:09:00 AM COMPARISON: 09/19/2023 CLINICAL HISTORY: AMS. AMS- 3-4 weeks per family. FINDINGS: LINES, TUBES AND DEVICES: Left chest loop recorder device in place. LUNGS AND PLEURA: Decreased mild pulmonary edema. No focal pulmonary opacity. No pleural effusion. No pneumothorax. HEART AND MEDIASTINUM: No acute abnormality of the cardiac and mediastinal silhouettes. BONES AND SOFT TISSUES: No acute osseous abnormality. IMPRESSION: 1. Decreased mild pulmonary edema. Electronically signed by: Evalene Coho MD 10/20/2023 10:34 AM EDT RP Workstation: [id number]    Subjective: Patient was seen and examined at bedside.  Overnight events noted. Patient reports feeling better and patient being discharged to SNF today.  Discharge Exam: Vitals:   11/11/23 0400 11/11/23 0845  BP: 120/72 128/72  Pulse: 94 93   Resp:  16  Temp: 97.9 F (36.6 C) 98.4 F (36.9 C)  SpO2: 98% 94%   Vitals:   11/11/23 0022 11/11/23 0400 11/11/23 0500 11/11/23 0845  BP: 123/65 120/72  128/72  Pulse: 92 94  93  Resp:    16  Temp: (!) 97.5 F (36.4 C) 97.9 F (36.6 C)  98.4 F (36.9 C)  TempSrc:  Oral  Oral  SpO2: 95% 98%  94%  Weight:   68 kg   Height:        General: Pt is alert, awake, not in acute distress Cardiovascular: RRR, S1/S2 +, no rubs, no gallops Respiratory: CTA bilaterally, no wheezing, no rhonchi Abdominal: Soft, NT, ND, bowel sounds + Extremities: no edema, no cyanosis    The results of significant diagnostics from this hospitalization (including imaging, microbiology, ancillary and laboratory) are listed below for reference.     Microbiology: No results found for this or any previous visit (from the past 240 hours).   Labs: BNP (last 3 results) Recent Labs    09/19/23 1943 10/20/23 1625  BNP 78.7 144.1*   Basic Metabolic Panel: Recent Labs  Lab 11/05/23 0425 11/10/23 0443 11/11/23 0438  NA 141 142  --   K 3.4* 3.5  --   CL 108 107  --   CO2 25 23  --   GLUCOSE 116* 130*  --   BUN 9 12  --   CREATININE 0.57 0.53  --   CALCIUM  9.2 9.4  --   MG 1.7 1.6* 1.8  PHOS  --  3.9  --    Liver Function Tests: Recent Labs  Lab 11/05/23 0425  AST 20  ALT 25  ALKPHOS 129*  BILITOT 0.6  PROT 5.9*  ALBUMIN 2.5*   No results for input(s): LIPASE, AMYLASE in the last 168 hours. No results for input(s): AMMONIA in the last 168 hours. CBC: Recent Labs  Lab 11/05/23 0425 11/10/23 0443  WBC 5.9 5.9  HGB 11.4* 12.7  HCT 38.3 42.8  MCV 76.0* 76.3*  PLT 283 316   Cardiac Enzymes: No results for input(s): CKTOTAL, CKMB, CKMBINDEX, TROPONINI in the last 168 hours. BNP: Invalid input(s): POCBNP CBG: Recent Labs  Lab 11/10/23 1638 11/10/23 1726 11/10/23 1751 11/10/23 2014 11/11/23 0758  GLUCAP 82 90 129* 237* 199*   D-Dimer No results for  input(s): DDIMER in the last 72 hours. Hgb A1c No results for input(s): HGBA1C in the last 72 hours. Lipid Profile No results for input(s): CHOL, HDL, LDLCALC, TRIG, CHOLHDL, LDLDIRECT in the last 72 hours. Thyroid function studies No results for input(s): TSH, T4TOTAL, T3FREE, THYROIDAB in the last 72 hours.  Invalid input(s): FREET3 Anemia work up No results for input(s): VITAMINB12, FOLATE, FERRITIN, TIBC,  IRON , RETICCTPCT in the last 72 hours. Urinalysis    Component Value Date/Time   COLORURINE AMBER (A) 10/20/2023 1110   APPEARANCEUR HAZY (A) 10/20/2023 1110   LABSPEC 1.022 10/20/2023 1110   PHURINE 5.0 10/20/2023 1110   GLUCOSEU >=500 (A) 10/20/2023 1110   HGBUR SMALL (A) 10/20/2023 1110   BILIRUBINUR NEGATIVE 10/20/2023 1110   KETONESUR NEGATIVE 10/20/2023 1110   PROTEINUR 100 (A) 10/20/2023 1110   NITRITE NEGATIVE 10/20/2023 1110   LEUKOCYTESUR TRACE (A) 10/20/2023 1110   Sepsis Labs Recent Labs  Lab 11/05/23 0425 11/10/23 0443  WBC 5.9 5.9   Microbiology No results found for this or any previous visit (from the past 240 hours).   Time coordinating discharge: Over 30 minutes  SIGNED:   Darcel Dawley, MD  [facility] 11/11/2023, 10:46 AM Pager   If 7PM-7AM, please contact night-coverage

## 2023-11-11 NOTE — TOC Transition Note (Signed)
 Transition of Care [facility]) - Discharge Note   Patient Details  Name: Allison Hughes MRN: [id number] Date of Birth: [birth year]  Transition of Care [facility]) CM/SW Contact:  [name] Clack, LCSWA Phone Number: 11/11/2023, 4:30 PM   Clinical Narrative:     Patient will DC to: [facility] Anticipated DC date: 11/11/23  Family notified: [facility] by: [place]   Per MD patient ready for DC to [facility]. RN to call report prior to discharge [phone], room 111 ). RN, patient, patient's family, and facility notified of DC. Discharge Summary and FL2 sent to facility. DC packet on chart. Ambulance transport requested for patient.   CSW will sign off for now as social work intervention is no longer needed. Please consult us  again if new needs arise.    Final next level of care: Skilled Nursing Facility Barriers to Discharge: Barriers Resolved   Patient Goals and CMS Choice            Discharge Placement                Patient to be transferred to facility by: [facility] Name of family member notified: Phillip/friend Patient and family notified of of transfer: 11/11/23  Discharge Plan and Services Additional resources added to the After Visit Summary for                                       Social Drivers of Health (SDOH) Interventions SDOH Screenings   Food Insecurity: No Food Insecurity (11/06/2023)  Housing: Low Risk  (11/06/2023)  Transportation Needs: No Transportation Needs (11/06/2023)  Utilities: Not At Risk (11/06/2023)  Depression (PHQ2-9): Low Risk  (10/26/2020)  Social Connections: Patient Unable To Answer (10/20/2023)  Tobacco Use: High Risk (10/20/2023)     Readmission Risk Interventions    10/24/2023    2:14 PM  Readmission Risk Prevention Plan  Transportation Screening Complete  PCP or Specialist Appt within 3-5 Days Complete  HRI or Home Care Consult Complete  Social Work Consult for Recovery Care  Planning/Counseling Complete  Palliative Care Screening Complete  Medication Review [employer]) Referral to Pharmacy

## 2023-11-11 NOTE — Discharge Instructions (Signed)
 Advised to follow-up with primary care physician in 1 week. Advised to take Seroquel  50 mg daily. Patient is being discharged to skilled nursing facility for rehab.
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
## 2023-11-19 ENCOUNTER — Encounter

## 2023-11-20 ENCOUNTER — Encounter

## 2023-11-21 ENCOUNTER — Encounter

## 2023-11-29 ENCOUNTER — Encounter (HOSPITAL_COMMUNITY): Payer: Self-pay

## 2023-11-29 ENCOUNTER — Emergency Department (HOSPITAL_COMMUNITY)

## 2023-11-29 ENCOUNTER — Other Ambulatory Visit: Payer: Self-pay

## 2023-11-29 ENCOUNTER — Observation Stay (HOSPITAL_COMMUNITY)
Admission: EM | Admit: 2023-11-29 | Discharge: 2023-12-04 | Disposition: A | Discharge status: Home / Self Care | Attending: Internal Medicine | Admitting: Internal Medicine

## 2023-11-29 DIAGNOSIS — E785 Hyperlipidemia, unspecified: Secondary | ICD-10-CM | POA: Insufficient documentation

## 2023-11-29 DIAGNOSIS — R410 Disorientation, unspecified: Secondary | ICD-10-CM

## 2023-11-29 DIAGNOSIS — E1169 Type 2 diabetes mellitus with other specified complication: Secondary | ICD-10-CM

## 2023-11-29 DIAGNOSIS — I5032 Chronic diastolic (congestive) heart failure: Secondary | ICD-10-CM | POA: Insufficient documentation

## 2023-11-29 DIAGNOSIS — Y92009 Unspecified place in unspecified non-institutional (private) residence as the place of occurrence of the external cause: Secondary | ICD-10-CM

## 2023-11-29 DIAGNOSIS — I7410 Embolism and thrombosis of unspecified parts of aorta: Secondary | ICD-10-CM | POA: Diagnosis not present

## 2023-11-29 DIAGNOSIS — R627 Adult failure to thrive: Secondary | ICD-10-CM | POA: Insufficient documentation

## 2023-11-29 DIAGNOSIS — R419 Unspecified symptoms and signs involving cognitive functions and awareness: Secondary | ICD-10-CM | POA: Diagnosis not present

## 2023-11-29 DIAGNOSIS — Z794 Long term (current) use of insulin: Secondary | ICD-10-CM | POA: Insufficient documentation

## 2023-11-29 DIAGNOSIS — I10 Essential (primary) hypertension: Secondary | ICD-10-CM

## 2023-11-29 DIAGNOSIS — G9341 Metabolic encephalopathy: Principal | ICD-10-CM | POA: Insufficient documentation

## 2023-11-29 DIAGNOSIS — I219 Acute myocardial infarction, unspecified: Secondary | ICD-10-CM | POA: Insufficient documentation

## 2023-11-29 DIAGNOSIS — G3184 Mild cognitive impairment, so stated: Secondary | ICD-10-CM | POA: Insufficient documentation

## 2023-11-29 DIAGNOSIS — R4182 Altered mental status, unspecified: Secondary | ICD-10-CM | POA: Insufficient documentation

## 2023-11-29 DIAGNOSIS — F17210 Nicotine dependence, cigarettes, uncomplicated: Secondary | ICD-10-CM | POA: Insufficient documentation

## 2023-11-29 DIAGNOSIS — W19XXXA Unspecified fall, initial encounter: Secondary | ICD-10-CM | POA: Diagnosis not present

## 2023-11-29 DIAGNOSIS — E66811 Obesity, class 1: Secondary | ICD-10-CM | POA: Insufficient documentation

## 2023-11-29 DIAGNOSIS — Z8673 Personal history of transient ischemic attack (TIA), and cerebral infarction without residual deficits: Secondary | ICD-10-CM | POA: Insufficient documentation

## 2023-11-29 DIAGNOSIS — Z1152 Encounter for screening for COVID-19: Secondary | ICD-10-CM | POA: Insufficient documentation

## 2023-11-29 DIAGNOSIS — Z7901 Long term (current) use of anticoagulants: Secondary | ICD-10-CM | POA: Insufficient documentation

## 2023-11-29 DIAGNOSIS — Z8711 Personal history of peptic ulcer disease: Secondary | ICD-10-CM | POA: Insufficient documentation

## 2023-11-29 DIAGNOSIS — I110 Hypertensive heart disease with heart failure: Secondary | ICD-10-CM | POA: Insufficient documentation

## 2023-11-29 DIAGNOSIS — E119 Type 2 diabetes mellitus without complications: Secondary | ICD-10-CM | POA: Insufficient documentation

## 2023-11-29 DIAGNOSIS — K219 Gastro-esophageal reflux disease without esophagitis: Secondary | ICD-10-CM | POA: Insufficient documentation

## 2023-11-29 DIAGNOSIS — Z79899 Other long term (current) drug therapy: Secondary | ICD-10-CM | POA: Insufficient documentation

## 2023-11-29 DIAGNOSIS — G9340 Encephalopathy, unspecified: Secondary | ICD-10-CM | POA: Diagnosis present

## 2023-11-29 DIAGNOSIS — Z6833 Body mass index (BMI) 33.0-33.9, adult: Secondary | ICD-10-CM | POA: Insufficient documentation

## 2023-11-29 LAB — CBC
HCT: 40.1 % (ref 36.0–46.0)
Hemoglobin: 12.3 g/dL (ref 12.0–15.0)
MCH: 23.6 pg — ABNORMAL LOW (ref 26.0–34.0)
MCHC: 30.7 g/dL (ref 30.0–36.0)
MCV: 76.8 fL — ABNORMAL LOW (ref 80.0–100.0)
Platelets: 276 K/uL (ref 150–400)
RBC: 5.22 MIL/uL — ABNORMAL HIGH (ref 3.87–5.11)
RDW: 19.9 % — ABNORMAL HIGH (ref 11.5–15.5)
WBC: 7.3 K/uL (ref 4.0–10.5)
nRBC: 0 % (ref 0.0–0.2)

## 2023-11-29 LAB — CBG MONITORING, ED: Glucose-Capillary: 172 mg/dL — ABNORMAL HIGH (ref 70–99)

## 2023-11-29 LAB — COMPREHENSIVE METABOLIC PANEL WITH GFR
ALT: 33 U/L (ref 0–44)
AST: 26 U/L (ref 15–41)
Albumin: 2.9 g/dL — ABNORMAL LOW (ref 3.5–5.0)
Alkaline Phosphatase: 145 U/L — ABNORMAL HIGH (ref 38–126)
Anion gap: 11 (ref 5–15)
BUN: 9 mg/dL (ref 8–23)
CO2: 25 mmol/L (ref 22–32)
Calcium: 9.3 mg/dL (ref 8.9–10.3)
Chloride: 104 mmol/L (ref 98–111)
Creatinine, Ser: 0.6 mg/dL (ref 0.44–1.00)
GFR, Estimated: >60 mL/min (ref >60)
Glucose, Bld: 186 mg/dL — ABNORMAL HIGH (ref 70–99)
Potassium: 3.8 mmol/L (ref 3.5–5.1)
Sodium: 140 mmol/L (ref 135–145)
Total Bilirubin: 0.6 mg/dL (ref 0.0–1.2)
Total Protein: 6.2 g/dL — ABNORMAL LOW (ref 6.5–8.1)

## 2023-11-29 LAB — RESP PANEL BY RT-PCR (RSV, FLU A&B, COVID)  RVPGX2
Influenza A by PCR: NEGATIVE
Influenza B by PCR: NEGATIVE
Resp Syncytial Virus by PCR: NEGATIVE
SARS Coronavirus 2 by RT PCR: NEGATIVE

## 2023-11-29 LAB — ETHANOL: Alcohol, Ethyl (B): <15 mg/dL (ref <15)

## 2023-11-29 LAB — I-STAT CHEM 8, ED
BUN: 10 mg/dL (ref 8–23)
Calcium, Ion: 1.17 mmol/L (ref 1.15–1.40)
Chloride: 105 mmol/L (ref 98–111)
Creatinine, Ser: 0.7 mg/dL (ref 0.44–1.00)
Glucose, Bld: 182 mg/dL — ABNORMAL HIGH (ref 70–99)
HCT: 38 % (ref 36.0–46.0)
Hemoglobin: 12.9 g/dL (ref 12.0–15.0)
Potassium: 3.9 mmol/L (ref 3.5–5.1)
Sodium: 140 mmol/L (ref 135–145)
TCO2: 27 mmol/L (ref 22–32)

## 2023-11-29 LAB — GLUCOSE, CAPILLARY: Glucose-Capillary: 151 mg/dL — ABNORMAL HIGH (ref 70–99)

## 2023-11-29 LAB — I-STAT VENOUS BLOOD GAS, ED
Acid-Base Excess: 6 mmol/L — ABNORMAL HIGH (ref 0.0–2.0)
Bicarbonate: 31.9 mmol/L — ABNORMAL HIGH (ref 20.0–28.0)
Calcium, Ion: 1.22 mmol/L (ref 1.15–1.40)
HCT: 36 % (ref 36.0–46.0)
Hemoglobin: 12.2 g/dL (ref 12.0–15.0)
O2 Saturation: 49 %
Potassium: 3.9 mmol/L (ref 3.5–5.1)
Sodium: 141 mmol/L (ref 135–145)
TCO2: 33 mmol/L — ABNORMAL HIGH (ref 22–32)
pCO2, Ven: 50.6 mmHg (ref 44–60)
pH, Ven: 7.407 (ref 7.25–7.43)
pO2, Ven: 27 mmHg — CL (ref 32–45)

## 2023-11-29 LAB — AMMONIA: Ammonia: <13 umol/L (ref 9–35)

## 2023-11-29 LAB — SAMPLE TO BLOOD BANK

## 2023-11-29 LAB — CK TOTAL AND CKMB (NOT AT ARMC)
CK, MB: 1.2 ng/mL (ref 0.5–5.0)
Total CK: 38 U/L (ref 38–234)

## 2023-11-29 LAB — I-STAT CG4 LACTIC ACID, ED: Lactic Acid, Venous: 1.8 mmol/L (ref 0.5–1.9)

## 2023-11-29 LAB — PROTIME-INR
INR: 0.9 (ref 0.8–1.2)
Prothrombin Time: 12.9 s (ref 11.4–15.2)

## 2023-11-29 MED ORDER — INSULIN GLARGINE-YFGN 100 UNIT/ML ~~LOC~~ SOLN
15.0000 [IU] | Freq: Every day | SUBCUTANEOUS | Status: DC
  Administered 2023-11-29 – 2023-12-03 (×5): 15 [IU] via SUBCUTANEOUS
  Filled 2023-11-29 (×6): qty 0.15

## 2023-11-29 MED ORDER — ALPRAZOLAM 0.25 MG PO TABS
0.2500 mg | ORAL_TABLET | Freq: Two times a day (BID) | ORAL | Status: DC | PRN
Start: 2023-11-29 — End: 2023-12-04
  Administered 2023-11-30 – 2023-12-03 (×3): 0.25 mg via ORAL
  Filled 2023-11-29 (×3): qty 1

## 2023-11-29 MED ORDER — ENSURE MAX PROTEIN PO LIQD
11.0000 [oz_av] | Freq: Two times a day (BID) | ORAL | Status: DC
  Administered 2023-11-30 – 2023-12-04 (×8): 11 [oz_av] via ORAL
  Filled 2023-11-29 (×12): qty 330

## 2023-11-29 MED ORDER — ONDANSETRON HCL 4 MG/2ML IJ SOLN: 4.0000 mg | Freq: Four times a day (QID) | INTRAMUSCULAR | Status: DC | PRN

## 2023-11-29 MED ORDER — BUDESON-GLYCOPYRROL-FORMOTEROL 160-9-4.8 MCG/ACT IN AERO
2.0000 | INHALATION_SPRAY | Freq: Two times a day (BID) | RESPIRATORY_TRACT | Status: DC
  Administered 2023-12-01 – 2023-12-04 (×7): 2 via RESPIRATORY_TRACT
  Filled 2023-11-29 (×2): qty 5.9

## 2023-11-29 MED ORDER — ACETAMINOPHEN 325 MG PO TABS
650.0000 mg | ORAL_TABLET | Freq: Four times a day (QID) | ORAL | Status: DC | PRN
  Administered 2023-11-30: 650 mg via ORAL
  Filled 2023-11-29: qty 2

## 2023-11-29 MED ORDER — QUETIAPINE FUMARATE 25 MG PO TABS
50.0000 mg | ORAL_TABLET | Freq: Every day | ORAL | Status: DC
  Administered 2023-11-30 – 2023-12-03 (×4): 50 mg via ORAL
  Filled 2023-11-29 (×4): qty 2

## 2023-11-29 MED ORDER — ENOXAPARIN SODIUM 40 MG/0.4ML IJ SOSY
40.0000 mg | PREFILLED_SYRINGE | INTRAMUSCULAR | Status: DC
  Administered 2023-11-29 – 2023-12-03 (×5): 40 mg via SUBCUTANEOUS
  Filled 2023-11-29 (×5): qty 0.4

## 2023-11-29 MED ORDER — ASPIRIN 81 MG PO TBEC
81.0000 mg | DELAYED_RELEASE_TABLET | Freq: Every day | ORAL | Status: DC
  Administered 2023-11-30 – 2023-12-04 (×5): 81 mg via ORAL
  Filled 2023-11-29 (×5): qty 1

## 2023-11-29 MED ORDER — LISINOPRIL 20 MG PO TABS
30.0000 mg | ORAL_TABLET | Freq: Every day | ORAL | Status: DC
  Administered 2023-11-30 – 2023-12-03 (×4): 30 mg via ORAL
  Filled 2023-11-29 (×4): qty 1

## 2023-11-29 MED ORDER — POTASSIUM CHLORIDE CRYS ER 20 MEQ PO TBCR
20.0000 meq | EXTENDED_RELEASE_TABLET | Freq: Every day | ORAL | Status: DC
  Administered 2023-11-30 – 2023-12-04 (×5): 20 meq via ORAL
  Filled 2023-11-29 (×5): qty 1

## 2023-11-29 MED ORDER — QUETIAPINE FUMARATE 25 MG PO TABS
50.0000 mg | ORAL_TABLET | Freq: Every morning | ORAL | Status: DC
  Administered 2023-11-30 – 2023-12-04 (×5): 50 mg via ORAL
  Filled 2023-11-29 (×6): qty 2

## 2023-11-29 MED ORDER — ACETAMINOPHEN 650 MG RE SUPP: 650.0000 mg | Freq: Four times a day (QID) | RECTAL | Status: DC | PRN

## 2023-11-29 MED ORDER — ATORVASTATIN CALCIUM 40 MG PO TABS
40.0000 mg | ORAL_TABLET | Freq: Every day | ORAL | Status: DC
  Administered 2023-11-30 – 2023-12-04 (×5): 40 mg via ORAL
  Filled 2023-11-29 (×5): qty 1

## 2023-11-29 MED ORDER — SODIUM CHLORIDE 0.9% FLUSH
3.0000 mL | Freq: Two times a day (BID) | INTRAVENOUS | Status: DC
  Administered 2023-11-29 – 2023-12-04 (×10): 3 mL via INTRAVENOUS

## 2023-11-29 MED ORDER — IOHEXOL 350 MG/ML SOLN
75.0000 mL | Freq: Once | INTRAVENOUS | Status: AC | PRN
  Administered 2023-11-29: 75 mL via INTRAVENOUS

## 2023-11-29 MED ORDER — VITAMIN B-12 1000 MCG PO TABS
500.0000 ug | ORAL_TABLET | Freq: Every day | ORAL | Status: DC
  Administered 2023-11-30 – 2023-12-04 (×5): 500 ug via ORAL
  Filled 2023-11-29 (×5): qty 1

## 2023-11-29 MED ORDER — PANTOPRAZOLE SODIUM 40 MG PO TBEC
40.0000 mg | DELAYED_RELEASE_TABLET | Freq: Every day | ORAL | Status: DC
  Administered 2023-11-30 – 2023-12-04 (×5): 40 mg via ORAL
  Filled 2023-11-29 (×5): qty 1

## 2023-11-29 MED ORDER — ONDANSETRON HCL 4 MG PO TABS: 4.0000 mg | ORAL_TABLET | Freq: Four times a day (QID) | ORAL | Status: DC | PRN

## 2023-11-29 MED ORDER — MAGNESIUM CHLORIDE 64 MG PO TBEC
1.0000 | DELAYED_RELEASE_TABLET | Freq: Every day | ORAL | Status: DC
  Administered 2023-11-30 – 2023-12-04 (×5): 64 mg via ORAL
  Filled 2023-11-29 (×5): qty 1

## 2023-11-29 MED ORDER — ALBUTEROL SULFATE (2.5 MG/3ML) 0.083% IN NEBU: 2.5000 mg | INHALATION_SOLUTION | Freq: Four times a day (QID) | RESPIRATORY_TRACT | Status: DC | PRN

## 2023-11-29 MED ORDER — INSULIN DEGLUDEC 100 UNIT/ML ~~LOC~~ SOLN: 15.0000 [IU] | Freq: Every day | SUBCUTANEOUS | Status: DC

## 2023-11-29 NOTE — Consult Note (Signed)
 Hospital Consult    Reason for Consult:  aortic mural thrombus Referring Physician:  Dr. Yolande MRN #:  [id number]  History of Present Illness: This is a 73 y.o. female recently admitted with acute metabolic encephalopathy as well as hypoxic respiratory failure secondary to COVID with hospitalization complicated by delirium.  She does have a history of stroke as well as hyperlipidemia and type 2 diabetes.  Today she presented to the emergency department after being found down.  On my exam the patient is able to interact does not recall while she is in the hospital.  She does specifically deny abdominal pain or leg pain and endorses that she was at least a former smoker but is unsure of her medication regimen.  She does have statin listed in her med list.  Past Medical History:  Diagnosis Date   Acute metabolic encephalopathy 10/20/2023   Acute pyelonephritis 02/02/2016   Acute respiratory failure with hypoxia (HCC) 09/20/2023   Diabetes mellitus 01/15/2004   T2DM   Hyperlipidemia    Hypertension    Obesity (BMI 30-39.9)    Pneumonia 09/06/2020   Sepsis (HCC) 02/02/2016   Stroke (HCC)    Urinary tract infection 10/20/2023    Past Surgical History:  Procedure Laterality Date   CESAREAN SECTION     LOOP RECORDER INSERTION N/A 09/08/2020   Procedure: LOOP RECORDER INSERTION;  Surgeon: Inocencio Soyla Lunger, MD;  Location: [facility] IN[facility]ASIVE [facility];  Service: Cardiovascular;  Laterality: N/A;   ORIF ANKLE FRACTURE  05/11/2011   Procedure: OPEN REDUCTION INTERNAL FIXATION (ORIF) ANKLE FRACTURE;  Surgeon: Jerona LULLA Sage, MD;  Location: [facility] OR;  Service: Orthopedics;  Laterality: Right;   TUBAL LIGATION      Allergies  Allergen Reactions   Iodine Swelling   Shrimp [Shellfish Allergy] Swelling    Fresh shrimp    Prior to Admission medications   Medication Sig Start Date End Date Taking? Authorizing Provider  acetaminophen  (TYLENOL ) 500 MG tablet Take 2 tablets (1,000 mg total) by mouth  every 8 (eight) hours as needed for mild pain (pain score 1-3) or headache. 09/23/23   Kathrin Mignon DASEN, MD  albuterol  (VENTOLIN  HFA) 108 (90 Base) MCG/ACT inhaler Inhale 2 puffs into the lungs every 6 (six) hours as needed for wheezing or shortness of breath. 06/06/22   Ghimire, Donalda HERO, MD  ALPRAZolam  (XANAX ) 0.25 MG tablet Take 1 tablet (0.25 mg total) by mouth 2 (two) times daily as needed for anxiety. 09/23/23   Gonfa, Taye T, MD  atorvastatin  (LIPITOR) 40 MG tablet Take 40 mg by mouth daily. 08/06/18   [provider]  cyanocobalamin  (VITAMIN B12) 500 MCG tablet Take 1 tablet (500 mcg total) by mouth daily. 11/01/23   Fausto Burnard LABOR, DO  Ensure Max Protein (ENSURE MAX PROTEIN) LIQD Take 330 mLs (11 oz total) by mouth 2 (two) times daily. 10/31/23   Fausto Burnard LABOR, DO  Fluticasone -Umeclidin-Vilant (TRELEGY ELLIPTA) 100-62.5-25 MCG/ACT AEPB Inhale 1 puff into the lungs daily at 12 noon.    [provider]  lisinopril  (ZESTRIL ) 30 MG tablet Take 1 tablet (30 mg total) by mouth at bedtime. 10/31/23   Fausto Burnard LABOR, DO  magnesium  chloride (SLOW-MAG) 64 MG TBEC SR tablet Take 1 tablet (64 mg total) by mouth daily. 10/31/23   Fausto Burnard LABOR, DO  metFORMIN  (GLUCOPHAGE -XR) 500 MG 24 hr tablet Take 500 mg by mouth at bedtime. 10/03/20   [provider]  pantoprazole  (PROTONIX ) 40 MG tablet Take 1 tablet (40  mg total) by mouth 2 (two) times daily for 30 days, THEN 1 tablet (40 mg total) daily. 09/23/23 12/22/23  Gonfa, Taye T, MD  potassium chloride  SA (KLOR-CON  M) 20 MEQ tablet Take 1 tablet (20 mEq total) by mouth daily. 10/31/23   Fausto Sor A, DO  QUEtiapine  (SEROQUEL ) 50 MG tablet Take 1 tablet (50 mg total) by mouth at bedtime. 10/31/23   Fausto Sor A, DO  QUEtiapine  (SEROQUEL ) 50 MG tablet Take 1 tablet (50 mg total) by mouth every morning. 11/12/23 12/12/23  Leotis Bogus, MD  TRESIBA  100 UNIT/ML SOLN Inject 15 Units into the skin at bedtime. 09/23/23   Kathrin Mignon DASEN, MD    Social History   Socioeconomic History   Marital status: Divorced    Spouse name: Not on file   Number of children: Not on file   Years of education: Not on file   Highest education level: Not on file  Occupational History   Not on file  Tobacco Use   Smoking status: Every Day    Current packs/day: 1.00    Average packs/day: 1 pack/day for 40.0 years (40.0 ttl pk-yrs)    Types: Cigarettes   Smokeless tobacco: Former    Quit date: 05/11/2011  Substance and Sexual Activity   Alcohol use: No   Drug use: No   Sexual activity: Never  Other Topics Concern   Not on file  Social History Narrative   Not on file   Social Drivers of Health   Financial Resource Strain: Not on file  Food Insecurity: No Food Insecurity (11/06/2023)   Hunger Vital Sign    Worried About Running Out of Food in the Last Year: Never true    Ran Out of Food in the Last Year: Never true  Transportation Needs: No Transportation Needs (11/06/2023)   PRAPARE - Administrator, Civil Service (Medical): No    Lack of Transportation (Non-Medical): No  Physical Activity: Not on file  Stress: Not on file  Social Connections: Patient Unable To Answer (10/20/2023)   Social Connection and Isolation Panel    Frequency of Communication with Friends and Family: Patient unable to answer    Frequency of Social Gatherings with Friends and Family: Patient unable to answer    Attends Religious Services: Patient unable to answer    Active Member of Clubs or Organizations: Patient unable to answer    Attends Banker Meetings: Patient unable to answer    Marital Status: Patient unable to answer  Intimate Partner Violence: Not At Risk (11/06/2023)   Humiliation, Afraid, Rape, and Kick questionnaire    Fear of Current or Ex-Partner: No    Emotionally Abused: No    Physically Abused: No    Sexually Abused: No     Family History  Problem Relation Age of Onset   Drug abuse Daughter     Diabetes Daughter     Review of Systems  Constitutional: Negative.   HENT: Negative.    Eyes: Negative.   Respiratory: Negative.    Cardiovascular: Negative.   Gastrointestinal: Negative.   Musculoskeletal: Negative.   Skin: Negative.   Neurological: Negative.   Endo/Heme/Allergies: Negative.   Psychiatric/Behavioral: Negative.        Physical Examination  Vitals:   11/29/23 1346 11/29/23 1347  BP: 110/70   Pulse:  82  Resp:  15  Temp:    SpO2:  100%   Body mass index is 23.48 kg/m.  Physical Exam Constitutional:  Comments: Answers simple questions  HENT:     Mouth/Throat:     Mouth: Mucous membranes are dry.  Eyes:     Pupils: Pupils are equal, round, and reactive to light.  Cardiovascular:     Rate and Rhythm: Normal rate.     Pulses:          Dorsalis pedis pulses are 2+ on the right side and 2+ on the left side.  Abdominal:     General: Abdomen is flat.     Palpations: Abdomen is soft.  Musculoskeletal:        General: Normal range of motion.     Cervical back: Normal range of motion.     Right lower leg: No edema.  Skin:    General: Skin is warm.     Capillary Refill: Capillary refill takes less than 2 seconds.  Neurological:     Mental Status: She is alert.     Motor: No weakness.  Psychiatric:     Comments: Delayed responses      CBC    Component Value Date/Time   WBC 7.3 11/29/2023 1412   RBC 5.22 (H) 11/29/2023 1412   HGB 12.9 11/29/2023 1416   HCT 38.0 11/29/2023 1416   PLT 276 11/29/2023 1412   MCV 76.8 (L) 11/29/2023 1412   MCH 23.6 (L) 11/29/2023 1412   MCHC 30.7 11/29/2023 1412   RDW 19.9 (H) 11/29/2023 1412   LYMPHSABS 1.4 10/22/2023 0400   MONOABS 0.5 10/22/2023 0400   EOSABS 0.2 10/22/2023 0400   BASOSABS 0.1 10/22/2023 0400    BMET    Component Value Date/Time   NA 140 11/29/2023 1416   K 3.9 11/29/2023 1416   CL 105 11/29/2023 1416   CO2 25 11/29/2023 1412   GLUCOSE 182 (H) 11/29/2023 1416   BUN 10  11/29/2023 1416   CREATININE 0.70 11/29/2023 1416   CALCIUM  9.3 11/29/2023 1412   GFRNONAA >60 11/29/2023 1412   GFRAA >60 02/05/2016 0651    COAGS: Lab Results  Component Value Date   INR 0.9 11/29/2023   INR 1.0 06/03/2022   INR 1.03 05/12/2011     Non-Invasive Vascular Imaging:   CT IMPRESSION: 1. No acute findings. 2. Ground glass opacities in the lung bases and upper lobes, left worse than right, decreased from prior, with improved septal thickening. 3. 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     ASSESSMENT/PLAN: This is a 73 y.o. 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  The distal thoracic mural thrombus was present on the most recent CT scan but the chest was not evaluated at that time.  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  She should be on antiplatelet and statin.  Ceniya Fowers C. Sheree, MD [facility] Office: [phone] Pager: [phone]

## 2023-11-29 NOTE — ED Provider Notes (Signed)
 [facility] EMERGENCY DEPARTMENT AT [facility] Provider Note   CSN: [id number] Arrival date & time: 11/29/23  1319     Patient presents with: No chief complaint on file.   Allison Hughes is a 73 y.o. female.  {Add pertinent medical, surgical, social history, OB history to HPI:7957} 73 year old female history of stroke, COPD, diabetes, GI bleed, and cognitive impairment who presents to the emergency department after a fall.  Patient was admitted and discharged on 10/28 for altered mental status.  Was found to have COVID as well as a urinary tract infection.  She had an EEG and an MRI that did not reveal acute abnormality.  Was reportedly found on the floor and confused today and was brought into the emergency department for evaluation.  Patient is confused and not able to provide additional history       Prior to Admission medications   Medication Sig Start Date End Date Taking? Authorizing Provider  acetaminophen  (TYLENOL ) 500 MG tablet Take 2 tablets (1,000 mg total) by mouth every 8 (eight) hours as needed for mild pain (pain score 1-3) or headache. 09/23/23   Kathrin Mignon DASEN, MD  albuterol  (VENTOLIN  HFA) 108 (90 Base) MCG/ACT inhaler Inhale 2 puffs into the lungs every 6 (six) hours as needed for wheezing or shortness of breath. 06/06/22   Ghimire, Donalda HERO, MD  ALPRAZolam  (XANAX ) 0.25 MG tablet Take 1 tablet (0.25 mg total) by mouth 2 (two) times daily as needed for anxiety. 09/23/23   Gonfa, Taye T, MD  atorvastatin  (LIPITOR) 40 MG tablet Take 40 mg by mouth daily. 08/06/18   [provider]  cyanocobalamin  (VITAMIN B12) 500 MCG tablet Take 1 tablet (500 mcg total) by mouth daily. 11/01/23   Fausto Burnard LABOR, DO  Ensure Max Protein (ENSURE MAX PROTEIN) LIQD Take 330 mLs (11 oz total) by mouth 2 (two) times daily. 10/31/23   Fausto Burnard LABOR, DO  Fluticasone -Umeclidin-Vilant (TRELEGY ELLIPTA) 100-62.5-25 MCG/ACT AEPB Inhale 1 puff into the lungs daily at 12 noon.     [provider]  lisinopril  (ZESTRIL ) 30 MG tablet Take 1 tablet (30 mg total) by mouth at bedtime. 10/31/23   Fausto Burnard LABOR, DO  magnesium  chloride (SLOW-MAG) 64 MG TBEC SR tablet Take 1 tablet (64 mg total) by mouth daily. 10/31/23   Fausto Burnard LABOR, DO  metFORMIN  (GLUCOPHAGE -XR) 500 MG 24 hr tablet Take 500 mg by mouth at bedtime. 10/03/20   [provider]  pantoprazole  (PROTONIX ) 40 MG tablet Take 1 tablet (40 mg total) by mouth 2 (two) times daily for 30 days, THEN 1 tablet (40 mg total) daily. 09/23/23 12/22/23  Gonfa, Taye T, MD  potassium chloride  SA (KLOR-CON  M) 20 MEQ tablet Take 1 tablet (20 mEq total) by mouth daily. 10/31/23   Fausto Burnard LABOR, DO  QUEtiapine  (SEROQUEL ) 50 MG tablet Take 1 tablet (50 mg total) by mouth at bedtime. 10/31/23   Fausto Burnard LABOR, DO  QUEtiapine  (SEROQUEL ) 50 MG tablet Take 1 tablet (50 mg total) by mouth every morning. 11/12/23 12/12/23  Leotis Bogus, MD  TRESIBA  100 UNIT/ML SOLN Inject 15 Units into the skin at bedtime. 09/23/23   Gonfa, Taye T, MD    Allergies: Iodine and Shrimp [shellfish allergy]    Review of Systems  Updated Vital Signs BP (!) 125/54   Pulse 78   Temp 98 F (36.7 C) (Oral)   Resp 16   SpO2 97%   Physical Exam Vitals and nursing note reviewed.  Constitutional:      General: She is not in acute distress.    Appearance: She is well-developed. She is not ill-appearing.     Comments: Drowsy but occasionally will respond to voice.  HENT:     Head: Normocephalic and atraumatic.     Right Ear: External ear normal.     Left Ear: External ear normal.     Nose: Nose normal.  Eyes:     Extraocular Movements: Extraocular movements intact.     Conjunctiva/sclera: Conjunctivae normal.     Pupils: Pupils are equal, round, and reactive to light.     Comments: Pupils 3 mm bilaterally  Neck:     Comments: C-collar in place Cardiovascular:     Rate and Rhythm: Normal rate and regular rhythm.     Heart  sounds: No murmur heard.    Comments: Radial and DP pulses 2+ bilaterally Pulmonary:     Effort: Pulmonary effort is normal. No respiratory distress.     Breath sounds: Normal breath sounds.  Abdominal:     General: Abdomen is flat. There is no distension.     Palpations: Abdomen is soft. There is no mass.     Tenderness: There is no abdominal tenderness. There is no guarding.  Musculoskeletal:     Right lower leg: No edema.     Left lower leg: No edema.     Comments: No obvious deformities of hip or lower extremity.  No obvious deformities to the upper extremities.  No thoracic or lumbar midline tenderness to palpation  Skin:    General: Skin is warm and dry.  Neurological:     Comments: Alert and oriented to self but not place or date.  Withdraws all 4 extremities to pain.  Does not cooperate with neuroexam otherwise.     (all labs ordered are listed, but only abnormal results are displayed) Labs Reviewed  COMPREHENSIVE METABOLIC PANEL WITH GFR  CBC  ETHANOL  URINALYSIS, ROUTINE W REFLEX MICROSCOPIC  PROTIME-INR  CK TOTAL AND CKMB (NOT AT [facility])  I-STAT CHEM 8, ED  I-STAT CG4 LACTIC ACID, ED  SAMPLE TO BLOOD BANK    EKG: None  Radiology: No results found.  {Document cardiac monitor, telemetry assessment procedure when appropriate:32947} Procedures   Medications Ordered in the ED - No data to display    {Click here for ABCD2, HEART and other calculators REFRESH Note before signing:1}                              Medical Decision Making Amount and/or Complexity of Data Reviewed Labs: ordered. Radiology: ordered.  Risk Prescription drug management.   ***  {Document critical care time when appropriate  Document review of labs and clinical decision tools ie CHADS2VASC2, etc  Document your independent review of radiology images and any outside records  Document your discussion with family members, caretakers and with consultants  Document social determinants  of health affecting pt's care  Document your decision making why or why not admission, treatments were needed:32947:::1}   Final diagnoses:  None    ED Discharge Orders     None

## 2023-11-29 NOTE — Progress Notes (Signed)
 Orthopedic Tech Progress Note Patient Details:  Allison Hughes 1950/03/10 [id number] Level 2 Trauma  Patient ID: Allison Hughes Fire, female   DOB: 10-03-1950, 73 y.o.   MRN: [id number]  Allison Hughes Bar 11/29/2023, 4:53 PM

## 2023-11-29 NOTE — Plan of Care (Signed)
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
## 2023-11-29 NOTE — ED Notes (Signed)
 Pt cleansed of urine and changed into clean sheets, gown, and brief.

## 2023-11-29 NOTE — ED Triage Notes (Signed)
 Patient arrived from home after being found on floor by friends. Reported that she lives at home and unsure how long been down. Normally GCS 15 and today per EMS GCS 10.  Patient arrived with no IV and CBG  247. Pupils equal and reactive No obvious sign of trauma. Just released from rehab on Wednesday

## 2023-11-30 DIAGNOSIS — I10 Essential (primary) hypertension: Secondary | ICD-10-CM | POA: Diagnosis not present

## 2023-11-30 DIAGNOSIS — Y92009 Unspecified place in unspecified non-institutional (private) residence as the place of occurrence of the external cause: Secondary | ICD-10-CM | POA: Diagnosis not present

## 2023-11-30 DIAGNOSIS — W19XXXA Unspecified fall, initial encounter: Secondary | ICD-10-CM | POA: Diagnosis not present

## 2023-11-30 LAB — BASIC METABOLIC PANEL WITH GFR
Anion gap: 11 (ref 5–15)
BUN: 6 mg/dL — ABNORMAL LOW (ref 8–23)
CO2: 23 mmol/L (ref 22–32)
Calcium: 8.9 mg/dL (ref 8.9–10.3)
Chloride: 105 mmol/L (ref 98–111)
Creatinine, Ser: 0.49 mg/dL (ref 0.44–1.00)
GFR, Estimated: >60 mL/min (ref >60)
Glucose, Bld: 144 mg/dL — ABNORMAL HIGH (ref 70–99)
Potassium: 3.6 mmol/L (ref 3.5–5.1)
Sodium: 139 mmol/L (ref 135–145)

## 2023-11-30 LAB — GLUCOSE, CAPILLARY
Glucose-Capillary: 219 mg/dL — ABNORMAL HIGH (ref 70–99)
Glucose-Capillary: 150 mg/dL — ABNORMAL HIGH (ref 70–99)
Glucose-Capillary: 199 mg/dL — ABNORMAL HIGH (ref 70–99)

## 2023-11-30 LAB — CBC
HCT: 39.6 % (ref 36.0–46.0)
Hemoglobin: 12.3 g/dL (ref 12.0–15.0)
MCH: 24 pg — ABNORMAL LOW (ref 26.0–34.0)
MCHC: 31.1 g/dL (ref 30.0–36.0)
MCV: 77.2 fL — ABNORMAL LOW (ref 80.0–100.0)
Platelets: 239 K/uL (ref 150–400)
RBC: 5.13 MIL/uL — ABNORMAL HIGH (ref 3.87–5.11)
RDW: 19.9 % — ABNORMAL HIGH (ref 11.5–15.5)
WBC: 5.5 K/uL (ref 4.0–10.5)
nRBC: 0 % (ref 0.0–0.2)

## 2023-11-30 LAB — MAGNESIUM: Magnesium: 1.6 mg/dL — ABNORMAL LOW (ref 1.7–2.4)

## 2023-11-30 MED ORDER — MAGNESIUM SULFATE 4 GM/100ML IV SOLN
4.0000 g | Freq: Once | INTRAVENOUS | Status: AC
  Administered 2023-11-30: 4 g via INTRAVENOUS
  Filled 2023-11-30: qty 100

## 2023-11-30 MED ORDER — INSULIN ASPART 100 UNIT/ML IJ SOLN
0.0000 [IU] | Freq: Three times a day (TID) | INTRAMUSCULAR | Status: DC
  Administered 2023-11-30: 1 [IU] via SUBCUTANEOUS
  Administered 2023-12-01: 7 [IU] via SUBCUTANEOUS
  Administered 2023-12-01: 2 [IU] via SUBCUTANEOUS
  Administered 2023-12-01: 5 [IU] via SUBCUTANEOUS
  Administered 2023-12-02: 9 [IU] via SUBCUTANEOUS
  Administered 2023-12-02 (×2): 3 [IU] via SUBCUTANEOUS
  Filled 2023-11-30: qty 8
  Filled 2023-11-30: qty 1
  Filled 2023-11-30: qty 9
  Filled 2023-11-30: qty 2
  Filled 2023-11-30: qty 3

## 2023-11-30 NOTE — Care Management Obs Status (Signed)
 MEDICARE OBSERVATION STATUS NOTIFICATION   Patient Details  Name: Allison Hughes MRN: [id number] Date of Birth: 27-Sep-1950   Medicare Observation Status Notification Given:  Yes    Gabrian Hoque G., RN 11/30/2023, 8:51 AM

## 2023-11-30 NOTE — Evaluation (Signed)
 Physical Therapy Evaluation Patient Details Name: Allison Hughes MRN: [id number] DOB: [birth year] Today's Date: 11/30/2023  History of Present Illness  Pt is a 73 y.o. female found down on floor 11/15. Recent hospitalization 10/8-10/26 found on floor 10/6 with confusion and complicated UTI, followed by transfer to [facility]. Was d/c 11/12 from SNF back home alone with friend during day, where confusion inc. Presents to [facility] ED 11/15 with acute encephalopathy and continued confusion.  Clinical Impression  PTA, pt with recent stay at [facility] where she was d/c Wednesday, 11/12. Following d/c, she returned home with help from friend, where confusion increased. Per pt, she requires help from Mr. Adin (friend who stays with her during the day) with bathing, dressing, cooking. Further hx limited due to pt confusion. Currently, pt modA for bed mobility with assist required for both trunk and LE management. Step pivot transfer performed bed>BSC and BSC>chair, with pt requiring modA +2 with VC for stepping and turning coordination. Throughout session, pt pleasantly confused and only oriented to self. Recommending post-acute rehab <3hrs/day to continue to improve functional mobility and cognitive deficits. Acute PT to follow.   Vitals:  Rest, 2L [place]: 97% Rest, RA: 92% Following activity, RA: 90% Returned to 2L [place] at end of session.  BP sitting EOB: 119/61 (76)         If plan is discharge home, recommend the following: Two people to help with walking and/or transfers;A lot of help with bathing/dressing/bathroom;Assistance with cooking/housework;Direct supervision/assist for medications management;Assist for transportation;Direct supervision/assist for financial management;Help with stairs or ramp for entrance;Supervision due to cognitive status   Can travel by private vehicle   No    Equipment Recommendations None recommended by PT  Recommendations for Other Services       Functional Status  Assessment Patient has had a recent decline in their functional status and demonstrates the ability to make significant improvements in function in a reasonable and predictable amount of time.     Precautions / Restrictions Precautions Precautions: Fall Recall of Precautions/Restrictions: Impaired Precaution/Restrictions Comments: AMS Restrictions Weight Bearing Restrictions Per Provider Order: No      Mobility  Bed Mobility Overal bed mobility: Needs Assistance Bed Mobility: Supine to Sit     Supine to sit: Mod assist, Used rails, HOB elevated     General bed mobility comments: Pt with slow initiation, assist with scooting anteriorly as well as trunk and LE management for supine>sit    Transfers Overall transfer level: Needs assistance Equipment used: None Transfers: Sit to/from Stand, Bed to chair/wheelchair/BSC Sit to Stand: Mod assist, +2 physical assistance   Step pivot transfers: Mod assist, +2 physical assistance       General transfer comment: ModA x2 for STS and step pivot transfer. Pt with difficulty following commands for stepping and turning, with overall flat affect throughout.    Ambulation/Gait                  Stairs            Wheelchair Mobility     Tilt Bed    Modified Rankin (Stroke Patients Only)       Balance Overall balance assessment: Needs assistance Sitting-balance support: Feet supported, No upper extremity supported Sitting balance-Leahy Scale: Fair     Standing balance support: Bilateral upper extremity supported, During functional activity Standing balance-Leahy Scale: Poor Standing balance comment: standing with PT needs BUE support  Pertinent Vitals/Pain Pain Assessment Pain Assessment: Faces Faces Pain Scale: No hurt Pain Intervention(s): Monitored during session    Home Living Family/patient expects to be discharged to:: Private residence Living Arrangements:  Alone Available Help at Discharge: Personal care attendant;Available PRN/intermittently Type of Home: House Home Access: Stairs to enter Entrance Stairs-Rails: Right;Left;Can reach both Entrance Stairs-Number of Steps: 3   Home Layout: One level Home Equipment: Agricultural Consultant (2 wheels);Cane - single point;Shower seat;BSC/3in1 Additional Comments: all information from prior admission pt was unable to answer questions this session    Prior Function Prior Level of Function : Needs assist             Mobility Comments: Per hospitalist note, pt able to ambulate and eprform some ADLs with assist/modI. ADLs Comments: Per pt, she requires help from friend (Mr Adin) for showering, cooking, and dressing.     Extremity/Trunk Assessment   Upper Extremity Assessment Upper Extremity Assessment: Generalized weakness    Lower Extremity Assessment Lower Extremity Assessment: Generalized weakness       Communication   Communication Communication: Impaired Factors Affecting Communication: Hearing impaired;Reduced clarity of speech    Cognition Arousal: Alert Behavior During Therapy: Flat affect   PT - Cognitive impairments: History of cognitive impairments, No family/caregiver present to determine baseline, Orientation, Initiation, Sequencing, Safety/Judgement, Problem solving, Memory   Orientation impairments: Place, Time, Situation                   PT - Cognition Comments: Oriented to name and DOB, not time, situation, or place. Difficulty seqencing mobility tasks, and often required questions be repeated. Following commands: Impaired Following commands impaired: Follows one step commands inconsistently, Follows one step commands with increased time     Cueing Cueing Techniques: Verbal cues, Gestural cues, Tactile cues     General Comments General comments (skin integrity, edema, etc.): Pt pleasantly confused throughout session, and unable to provide much hx today.  Monitored SpO2 throughout (see clinical impression).    Exercises     Assessment/Plan    PT Assessment Patient needs continued PT services  PT Problem List Decreased strength;Decreased range of motion;Decreased activity tolerance;Decreased balance;Decreased mobility;Decreased cognition;Decreased knowledge of use of DME;Decreased safety awareness       PT Treatment Interventions DME instruction;Gait training;Stair training;Functional mobility training;Therapeutic activities;Therapeutic exercise;Balance training;Neuromuscular re-education;Cognitive remediation;Patient/family education;Manual techniques;Modalities    PT Goals (Current goals can be found in the Care Plan section)  Acute Rehab PT Goals Patient Stated Goal: Unable to state PT Goal Formulation: With patient Time For Goal Achievement: 12/14/23 Potential to Achieve Goals: Fair    Frequency Min 2X/week     Co-evaluation               AM-PAC PT 6 Clicks Mobility  Outcome Measure Help needed turning from your back to your side while in a flat bed without using bedrails?: A Lot Help needed moving from lying on your back to sitting on the side of a flat bed without using bedrails?: A Lot Help needed moving to and from a bed to a chair (including a wheelchair)?: A Lot Help needed standing up from a chair using your arms (e.g., wheelchair or bedside chair)?: A Lot Help needed to walk in hospital room?: A Lot Help needed climbing 3-5 steps with a railing? : Total 6 Click Score: 11    End of Session Equipment Utilized During Treatment: Gait belt Activity Tolerance: Patient tolerated treatment well Patient left: in chair;with call bell/phone within reach;with chair alarm  set Nurse Communication: Mobility status PT Visit Diagnosis: Muscle weakness (generalized) (M62.81);Other abnormalities of gait and mobility (R26.89)    Time: 9145-9080 PT Time Calculation (min) (ACUTE ONLY): 25 min   Charges:   PT  Evaluation $PT Eval Low Complexity: 1 Low PT Treatments $Therapeutic Activity: 8-22 mins PT General Charges $$ ACUTE PT VISIT: 1 Visit         Feliz Lincoln, SPT   Navreet Bolda 11/30/2023, 10:19 AM

## 2023-11-30 NOTE — Evaluation (Signed)
 Occupational Therapy Evaluation Patient Details Name: Allison Hughes MRN: [id number] DOB: 1950/05/05 Today's Date: 11/30/2023   History of Present Illness   73 y/o F presenting to ED on 11/15 after being found down at home. 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    Recent hospitalization 10/6-10/28 after being found down, encephalopathic in setting of UTI with respiratory failure 2/2 hypoxia and COVID 19, went to [facility]    PMH includes HTN, HLD, CVA, COPD, DM2, GIB, neuroendocrine ulcer     Clinical Impressions Pt recently d/c'd from SNF, was living alone with help from friend [name] who ?lives nearby. Pt currently with impaired cognition, needs cues to recall reason for hospital admission. Pt currently needs min-mod A for ADLs, mod A for transfers with RW. Pt taking 5-6 forward/backward steps with RW, cues for safety to reach back for arm rest of chair as pt plopping down in chair multiple times. Pt presenting with impairments listed below, will follow acutely. Patient will benefit from continued inpatient follow up therapy, <3 hours/day.      If plan is discharge home, recommend the following:   A lot of help with walking and/or transfers;A lot of help with bathing/dressing/bathroom;Assistance with cooking/housework;Assistance with feeding;Direct supervision/assist for medications management;Direct supervision/assist for financial management;Assist for transportation;Help with stairs or ramp for entrance;Supervision due to cognitive status     Functional Status Assessment   Patient has had a recent decline in their functional status and demonstrates the ability to make significant improvements in function in a reasonable and predictable amount of time.     Equipment Recommendations   Other (comment) (defer)     Recommendations for Other Services   PT consult     Precautions/Restrictions    Precautions Precautions: Fall Recall of Precautions/Restrictions: Impaired Precaution/Restrictions Comments: AMS Restrictions Weight Bearing Restrictions Per Provider Order: No     Mobility Bed Mobility               General bed mobility comments: in chair upon arrival and departure    Transfers Overall transfer level: Needs assistance Equipment used: Rolling walker (2 wheels) Transfers: Sit to/from Stand, Bed to chair/wheelchair/BSC Sit to Stand: Mod assist           General transfer comment: pt able to take 5-6 steps forward and back from chair, plops down in chair without reaching back for arm rests despite cues      Balance Overall balance assessment: Needs assistance Sitting-balance support: Feet supported, No upper extremity supported Sitting balance-Leahy Scale: Fair     Standing balance support: Bilateral upper extremity supported, During functional activity Standing balance-Leahy Scale: Poor                             ADL either performed or assessed with clinical judgement   ADL Overall ADL's : Needs assistance/impaired Eating/Feeding: Minimal assistance;Sitting   Grooming: Minimal assistance;Sitting;Wash/dry face   Upper Body Bathing: Moderate assistance   Lower Body Bathing: Moderate assistance   Upper Body Dressing : Moderate assistance   Lower Body Dressing: Moderate assistance   Toilet Transfer: Moderate assistance   Toileting- Clothing Manipulation and Hygiene: Total assistance       Functional mobility during ADLs: Total assistance       Vision   Additional Comments: not formally assessed, able to reach for and grasp RW accuratly and can reach for cup on  table     Perception Perception: Not tested       Praxis Praxis: Not tested       Pertinent Vitals/Pain Pain Assessment Pain Assessment: No/denies pain     Extremity/Trunk Assessment Upper Extremity Assessment Upper Extremity Assessment: Generalized  weakness   Lower Extremity Assessment Lower Extremity Assessment: Defer to PT evaluation   Cervical / Trunk Assessment Cervical / Trunk Assessment: Kyphotic   Communication Communication Communication: Impaired Factors Affecting Communication: Hearing impaired;Reduced clarity of speech   Cognition Arousal: Alert Behavior During Therapy: Flat affect Cognition: Cognition impaired   Orientation impairments: Situation, Place Awareness: Intellectual awareness impaired, Online awareness impaired Memory impairment (select all impairments): Short-term memory, Working civil service fast streamer, Non-declarative long-term memory, Geneticist, Molecular long-term memory Attention impairment (select first level of impairment): Focused attention, Sustained attention Executive functioning impairment (select all impairments): Initiation OT - Cognition Comments: overall flat affect and HOH, follows commands with incr time, minimal responses                 Following commands: Impaired Following commands impaired: Follows one step commands inconsistently, Follows one step commands with increased time     Cueing  General Comments   Cueing Techniques: Verbal cues;Gestural cues;Tactile cues  VSS on supplemental O2   Exercises     Shoulder Instructions      Home Living Family/patient expects to be discharged to:: Private residence Living Arrangements: Alone Available Help at Discharge: Personal care attendant;Available PRN/intermittently Type of Home: House Home Access: Stairs to enter [employer] of Steps: 3 Entrance Stairs-Rails: Right;Left;Can reach both Home Layout: One level     Bathroom Shower/Tub: Chief Strategy Officer: Standard Bathroom Accessibility: Yes   Home Equipment: Agricultural Consultant (2 wheels);Cane - single point;Shower seat;BSC/3in1   Additional Comments: reports friend phil helps her out at home daily      Prior Functioning/Environment Prior Level of Function :  Needs assist             Mobility Comments: Per hospitalist note, pt able to ambulate and eprform some ADLs with assist/modI. ADLs Comments: Per pt, she requires help from friend (Mr Adin) for showering, cooking, and dressing.    OT Problem List: Decreased strength;Decreased activity tolerance;Impaired balance (sitting and/or standing);Decreased cognition;Decreased safety awareness;Decreased knowledge of use of DME or AE;Decreased knowledge of precautions;Cardiopulmonary status limiting activity   OT Treatment/Interventions: Self-care/ADL training;Therapeutic exercise;Energy conservation;DME and/or AE instruction;Manual therapy;Modalities;Therapeutic activities;Cognitive remediation/compensation;Patient/family education;Balance training      OT Goals(Current goals can be found in the care plan section)   Acute Rehab OT Goals Patient Stated Goal: unable to state OT Goal Formulation: Patient unable to participate in goal setting Time For Goal Achievement: 12/14/23 Potential to Achieve Goals: Fair ADL Goals Pt Will Perform Upper Body Dressing: with supervision;sitting Pt Will Perform Lower Body Dressing: with min assist;sitting/lateral leans;sit to/from stand Pt Will Transfer to Toilet: with contact guard assist;ambulating;regular height toilet Additional ADL Goal #1: pt will perform bed mobility CGA in prep for ADLs   OT Frequency:  Min 2X/week    Co-evaluation              AM-PAC OT 6 Clicks Daily Activity     Outcome Measure Help from another person eating meals?: A Little Help from another person taking care of personal grooming?: A Little Help from another person toileting, which includes using toliet, bedpan, or urinal?: A Lot Help from another person bathing (including washing, rinsing, drying)?: A Lot Help from another person to put on  and taking off regular upper body clothing?: A Lot Help from another person to put on and taking off regular lower body  clothing?: A Lot 6 Click Score: 14   End of Session Equipment Utilized During Treatment: Gait belt;Rolling walker (2 wheels) Nurse Communication: Mobility status  Activity Tolerance: Patient tolerated treatment well Patient left: in chair;with call bell/phone within reach;with chair alarm set  OT Visit Diagnosis: Unsteadiness on feet (R26.81);Muscle weakness (generalized) (M62.81)                Time: 1045-1100 OT Time Calculation (min): 15 min Charges:  OT General Charges $OT Visit: 1 Visit OT Evaluation $OT Eval Low Complexity: 1 Low  Annaelle Kasel K, OTD, OTR/L SecureChat Preferred Acute Rehab [phone]   Carlina Derks K Koonce 11/30/2023, 12:02 PM

## 2023-11-30 NOTE — Progress Notes (Signed)
 PROGRESS NOTE    Allison Hughes  FMW:[id number] DOB: 06/17/1950 DOA: 11/29/2023 PCP: Valma Carwin, MD  Outpatient Specialists:     Brief Narrative:  Patient is a 73 year old female with past medical history significant for hypertension, hyperlipidemia, CVA, COPD, diabetes mellitus type 2, history of GI bleed and obesity.  Patient was found down at home.  Apparently, patient is failing to thrive at home.  Patient will likely need to be placed.  11/30/2023: Seen alongside patient's good friend.  The friend has been looking after the patient at home.  No significant history from patient.   Assessment & Plan:   Principal Problem:   Fall at home, initial encounter Active Problems:   Essential hypertension   DM (diabetes mellitus), type 2 (HCC)   History of stroke   Neurocognitive disorder   Chronic diastolic CHF (congestive heart failure) (HCC)   Hyperlipidemia   History of gastric ulcer   Acute encephalopathy   Aortic mural thrombus (HCC)   GERD (gastroesophageal reflux disease)   Fall at home -Patient presents after a fall at home.   - Patient lives alone at home.  Patient has a neighbor who is on her emergency contact list who comes to check on her.  However, patient neighbor is 62 years old and is unable to help her out whenever she falls.   - PT/OT input. - Consult TOC. - Likely placement.   Acute encephalopathy Cognitive impairment It appears patient has baseline cognitive impairment for which she may not be safe to live at home alone.  She comes back to the hospital with similar fashion as her previous hospitalization and is altered unable to follow commands at this time - Delirium precautions - Ammonia is less than 13 - Continue Seroquel  50 mg twice daily   Mural thrombus Patient noted to have an aortic mural thrombus on CT imaging.  Vascular surgery consulted and recommended antiplatelet therapy with statin. - Continue atorvastatin  and added aspirin  81 mg daily  per vascular surgery recommendations   Essential hypertension Blood pressures currently maintained. - Continue lisinopril      Chronic diastolic congestive heart failure -Compensated.   Controlled diabetes mellitus type 2, with long-term use of insulin  -Continue subcutaneous Semglee  15 units at nighttime. -Sliding scale insulin  coverage.   Hyperlipidemia - Continue atorvastatin    History of CVA Patient with a history of CVA with last back in 2022. - Continue statin  History of gastric ulcer GERD - Continue Protonix    Obesity, class I BMI 33.1 kg/m     DVT prophylaxis: Subcutaneous Lovenox  Code Status: Full code Family Communication: Patient's neighbor by bedside Disposition Plan: Patient will likely need placement   Consultants:  Vascular surgery team.  Procedures:  None.  Antimicrobials:  None   Subjective: No history from patient  Objective: Vitals:   11/30/23 0022 11/30/23 0406 11/30/23 0733 11/30/23 1100  BP: (!) 145/71 (!) 119/56 133/79 129/67  Pulse: 81 80 88 90  Resp: 18 18    Temp: 97.9 F (36.6 C) 97.7 F (36.5 C) 97.9 F (36.6 C) 98 F (36.7 C)  TempSrc:      SpO2: 95% 98% 97% 99%  Weight:      Height:       No intake or output data in the 24 hours ending 11/30/23 1144 Filed Weights   11/29/23 1509  Weight: 68 kg    Examination:  General exam: Appears calm and comfortable.  Sleepy. Respiratory system: Clear to auscultation. Respiratory effort normal. Cardiovascular system:  S1 & S2  Gastrointestinal system: Abdomen is soft and nontender.  Central nervous system: Patient is sleepy.  Data Reviewed: I have personally reviewed following labs and imaging studies  CBC: Recent Labs  Lab 11/29/23 1412 11/29/23 1416 11/29/23 1756 11/30/23 0556  WBC 7.3  --   --  5.5  HGB 12.3 12.9 12.2 12.3  HCT 40.1 38.0 36.0 39.6  MCV 76.8*  --   --  77.2*  PLT 276  --   --  239   Basic Metabolic Panel: Recent Labs  Lab 11/29/23 1412  11/29/23 1416 11/29/23 1756 11/30/23 0556  NA 140 140 141 139  K 3.8 3.9 3.9 3.6  CL 104 105  --  105  CO2 25  --   --  23  GLUCOSE 186* 182*  --  144*  BUN 9 10  --  6*  CREATININE 0.60 0.70  --  0.49  CALCIUM  9.3  --   --  8.9  MG  --   --   --  1.6*   GFR: Estimated Creatinine Clearance: 61.8 mL/min (by C-G formula based on SCr of 0.49 mg/dL). Liver Function Tests: Recent Labs  Lab 11/29/23 1412  AST 26  ALT 33  ALKPHOS 145*  BILITOT 0.6  PROT 6.2*  ALBUMIN 2.9*   No results for input(s): LIPASE, AMYLASE in the last 168 hours. Recent Labs  Lab 11/29/23 1845  AMMONIA <13   Coagulation Profile: Recent Labs  Lab 11/29/23 1412  INR 0.9   Cardiac Enzymes: Recent Labs  Lab 11/29/23 1412  CKTOTAL 38  CKMB 1.2   BNP (last 3 results) No results for input(s): PROBNP in the last 8760 hours. HbA1C: No results for input(s): HGBA1C in the last 72 hours. CBG: Recent Labs  Lab 11/29/23 1355 11/29/23 2244  GLUCAP 172* 151*   Lipid Profile: No results for input(s): CHOL, HDL, LDLCALC, TRIG, CHOLHDL, LDLDIRECT in the last 72 hours. Thyroid Function Tests: No results for input(s): TSH, T4TOTAL, FREET4, T3FREE, THYROIDAB in the last 72 hours. Anemia Panel: No results for input(s): VITAMINB12, FOLATE, FERRITIN, TIBC, IRON , RETICCTPCT in the last 72 hours. Urine analysis:    Component Value Date/Time   COLORURINE AMBER (A) 10/20/2023 1110   APPEARANCEUR HAZY (A) 10/20/2023 1110   LABSPEC 1.022 10/20/2023 1110   PHURINE 5.0 10/20/2023 1110   GLUCOSEU >=500 (A) 10/20/2023 1110   HGBUR SMALL (A) 10/20/2023 1110   BILIRUBINUR NEGATIVE 10/20/2023 1110   KETONESUR NEGATIVE 10/20/2023 1110   PROTEINUR 100 (A) 10/20/2023 1110   NITRITE NEGATIVE 10/20/2023 1110   LEUKOCYTESUR TRACE (A) 10/20/2023 1110   Sepsis Labs: @LABRCNTIP (procalcitonin:4,lacticidven:4)  ) Recent Results (from the past 240 hours)  Resp panel by RT-PCR  (RSV, Flu A&B, Covid) Anterior Nasal Swab     Status: None   Collection Time: 11/29/23  3:56 PM   Specimen: Anterior Nasal Swab  Result Value Ref Range Status   SARS Coronavirus 2 by RT PCR NEGATIVE NEGATIVE Final   Influenza A by PCR NEGATIVE NEGATIVE Final   Influenza B by PCR NEGATIVE NEGATIVE Final    Comment: (NOTE) The Xpert Xpress SARS-CoV-2/FLU/RSV plus assay is intended as an aid in the diagnosis of influenza from Nasopharyngeal swab specimens and should not be used as a sole basis for treatment. Nasal washings and aspirates are unacceptable for Xpert Xpress SARS-CoV-2/FLU/RSV testing.  Fact Sheet for Patients: bloggercourse.com  Fact Sheet for Healthcare Providers: seriousbroker.it  This test is not yet approved or cleared by  the [place]  FDA and has been authorized for detection and/or diagnosis of SARS-CoV-2 by FDA under an Emergency Use Authorization (EUA). This EUA will remain in effect (meaning this test can be used) for the duration of the COVID-19 declaration under Section 564(b)(1) of the Act, 21 U.S.C. section 360bbb-3(b)(1), unless the authorization is terminated or revoked.     Resp Syncytial Virus by PCR NEGATIVE NEGATIVE Final    Comment: (NOTE) Fact Sheet for Patients: bloggercourse.com  Fact Sheet for Healthcare Providers: seriousbroker.it  This test is not yet approved or cleared by the [place]  FDA and has been authorized for detection and/or diagnosis of SARS-CoV-2 by FDA under an Emergency Use Authorization (EUA). This EUA will remain in effect (meaning this test can be used) for the duration of the COVID-19 declaration under Section 564(b)(1) of the Act, 21 U.S.C. section 360bbb-3(b)(1), unless the authorization is terminated or revoked.  Performed at [facility], [address], [place], [place] [address]           Radiology Studies: CT CHEST ABDOMEN PELVIS W CONTRAST Result Date: 11/29/2023 EXAM: CT CHEST, ABDOMEN AND PELVIS WITH CONTRAST 11/29/2023 02:43:03 PM TECHNIQUE: CT of the chest, abdomen and pelvis was performed with the administration of intravenous contrast. 75 mL of iohexol  (OMNIPAQUE ) 350 MG/ML injection was administered. Multiplanar reformatted images are provided for review. Automated exposure control, iterative reconstruction, and/or weight based adjustment of the mA/kV was utilized to reduce the radiation dose to as low as reasonably achievable. COMPARISON: 09/19/2023 CLINICAL HISTORY: Polytrauma, blunt FINDINGS: CHEST: MEDIASTINUM AND LYMPH NODES: Scattered plaque through the thoracic aorta with some eccentric nonocclusive mural thrombus in the mid and distal descending segment, possible embolic risk. Heart and pericardium are unremarkable. Scattered coronary calcifications. The central airways are clear. No mediastinal, hilar or axillary lymphadenopathy. LUNGS AND PLEURA: Pulmonary ground glass opacities most conspicuous posteriorly in the lung bases but also seen in the upper lobes, left worse than right, showing some decrease since previous study. Septal thickening has significantly improved. No pleural effusion or pneumothorax. ABDOMEN AND PELVIS: LIVER: The liver is unremarkable. GALLBLADDER AND BILE DUCTS: Innumerable subcentimeter partially calcified gallstones layer in the dependent aspect of the nondilated gallbladder. No biliary ductal dilatation. SPLEEN: No acute abnormality. PANCREAS: No acute abnormality. ADRENAL GLANDS: No acute abnormality. KIDNEYS, URETERS AND BLADDER: No stones in the kidneys or ureters. No hydronephrosis. No perinephric or periureteral stranding. Urinary bladder is unremarkable. GI AND BOWEL: Stomach demonstrates no acute abnormality. There is no bowel obstruction. REPRODUCTIVE ORGANS: No acute abnormality. PERITONEUM AND RETROPERITONEUM: No ascites. No free  air. VASCULATURE: Abdominal aorta is normal in caliber. ABDOMINAL AND PELVIS LYMPH NODES: No lymphadenopathy. 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 Bilateral pelvic phleboliths. No focal soft tissue abnormality. IMPRESSION: 1. No acute findings. 2. Ground glass opacities in the lung bases and upper lobes, left worse than right, decreased from prior, with improved septal thickening. 3. 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 Electronically signed by: Katheleen Faes MD 11/29/2023 02:54 PM EST RP Workstation: [id number]   CT CERVICAL SPINE WO CONTRAST Result Date: 11/29/2023 CLINICAL DATA:  Blunt trauma EXAM: CT CERVICAL SPINE WITHOUT CONTRAST TECHNIQUE: Multidetector CT imaging of the cervical spine was performed without intravenous contrast. Multiplanar CT image reconstructions were also generated. RADIATION DOSE REDUCTION: This exam was performed according to the departmental dose-optimization  program which includes automated exposure control, adjustment of the mA and/or kV according to patient size and/or use of iterative reconstruction technique. COMPARISON:  10/20/2023 FINDINGS: Alignment: Normal. Skull base and vertebrae: No acute fracture. No primary bone lesion or focal pathologic process. Soft tissues and spinal canal: No prevertebral fluid or swelling. No visible canal hematoma. Disc levels: Moderate spondylosis at C4-5 and C5-6, with stable mild central canal stenosis and bilateral right greater than left neural foraminal encroachment at those levels. Mild C6-7 spondylosis. Upper chest: Airway is patent. Emphysematous changes are seen at the lung apices. Other: Reconstructed images demonstrate no additional findings. IMPRESSION: 1. No acute cervical spine fracture. 2. Stable  spondylosis and neural foraminal encroachment most pronounced at C4-5 and C5-6. 3. Pulmonary emphysema is present. Pulmonary emphysema is an independent risk factor for lung cancer. Recommend patient be considered for lung cancer screening. [place]  Chief Financial Officer. Screening for Lung Cancer: [place]  Licensed Conveyancer. JAMA. 2021;325(10):962-970. Electronically Signed   By: Ozell Daring M.D.   On: 11/29/2023 14:51   CT HEAD WO CONTRAST Result Date: 11/29/2023 CLINICAL DATA:  Moderate to severe head trauma, fell EXAM: CT HEAD WITHOUT CONTRAST TECHNIQUE: Contiguous axial images were obtained from the base of the skull through the vertex without intravenous contrast. RADIATION DOSE REDUCTION: This exam was performed according to the departmental dose-optimization program which includes automated exposure control, adjustment of the mA and/or kV according to patient size and/or use of iterative reconstruction technique. COMPARISON:  10/20/2023 FINDINGS: Brain: Stable chronic ischemic changes throughout the periventricular and subcortical white matter as well as left cerebellar hemisphere. No evidence of acute infarct or hemorrhage. The lateral ventricles and midline structures are otherwise stable. No acute extra-axial fluid collections. No mass effect. Vascular: No hyperdense vessel or unexpected calcification. Skull: Normal. Negative for fracture or focal lesion. Sinuses/Orbits: No acute finding. Other: None. IMPRESSION: 1. No acute intracranial process. 2. Stable chronic ischemic changes as above. Electronically Signed   By: Ozell Daring M.D.   On: 11/29/2023 14:47   DG Chest Port 1 View Result Date: 11/29/2023 CLINICAL DATA:  Found on floor at home by friends. EXAM: PORTABLE CHEST 1 VIEW COMPARISON:  10/20/2023 FINDINGS: Loop recorder projects over the left heart. Lungs are somewhat hypoinflated without lobar consolidation or effusion. Stone silhouette and  remainder of the exam is unchanged. IMPRESSION: Hypoinflation without acute cardiopulmonary disease. Electronically Signed   By: Toribio Agreste M.D.   On: 11/29/2023 14:10   DG Pelvis Portable Result Date: 11/29/2023 CLINICAL DATA:  Clemens, found down EXAM: PORTABLE PELVIS 1-2 VIEWS COMPARISON:  10/20/2023 FINDINGS: Supine frontal view of the pelvis and bilateral hips demonstrates no acute displaced fracture, subluxation, or dislocation. Joint spaces are well preserved. Sacroiliac joints are normal. IMPRESSION: 1. Unremarkable pelvis and bilateral hips. Electronically Signed   By: Ozell Daring M.D.   On: 11/29/2023 14:01        Scheduled Meds:  aspirin  EC  81 mg Oral Daily   atorvastatin   40 mg Oral Daily   budesonide -glycopyrrolate -formoterol   2 puff Inhalation BID   cyanocobalamin   500 mcg Oral Daily   enoxaparin  (LOVENOX ) injection  40 mg Subcutaneous Q24H   insulin  glargine-yfgn  15 Units Subcutaneous QHS   lisinopril   30 mg Oral QHS   magnesium  chloride  1 tablet Oral Daily   pantoprazole   40 mg Oral Daily   potassium chloride  SA  20 mEq Oral Daily   Ensure Max Protein  11 oz Oral  BID   QUEtiapine   50 mg Oral QHS   QUEtiapine   50 mg Oral q morning   sodium chloride  flush  3 mL Intravenous Q12H   Continuous Infusions:  magnesium  sulfate bolus IVPB       LOS: 0 days    Time spent: 35 minutes.    Leatrice Chapel, MD  [facility] Pager #: [phone] 7PM-7AM contact night coverage as above

## 2023-11-30 NOTE — Progress Notes (Signed)
  Progress Note    11/30/2023 12:40 PM * No surgery found *  Subjective: Feeling better today, somewhat sleepy.  She is able to converse more today and states that she has no abdominal pain and has an appetite and feels hungry.  Her legs are not causing her any problems and they are sensation and motor intact.  She is a former smoker quit on April 15 several years ago.  She was previously on Plavix  this was discontinued by her primary care provider.  Vitals:   11/30/23 0733 11/30/23 1100  BP: 133/79 129/67  Pulse: 88 90  Resp:    Temp: 97.9 F (36.6 C) 98 F (36.7 C)  SpO2: 97% 99%    Physical Exam: She is awake alert and oriented although at times nods off during our conversation Nonlabored respirations Abdomen is soft and nontender Both feet are warm and well-perfused with palpable dorsalis pedis pulses  CBC    Component Value Date/Time   WBC 5.5 11/30/2023 0556   RBC 5.13 (H) 11/30/2023 0556   HGB 12.3 11/30/2023 0556   HCT 39.6 11/30/2023 0556   PLT 239 11/30/2023 0556   MCV 77.2 (L) 11/30/2023 0556   MCH 24.0 (L) 11/30/2023 0556   MCHC 31.1 11/30/2023 0556   RDW 19.9 (H) 11/30/2023 0556   LYMPHSABS 1.4 10/22/2023 0400   MONOABS 0.5 10/22/2023 0400   EOSABS 0.2 10/22/2023 0400   BASOSABS 0.1 10/22/2023 0400    BMET    Component Value Date/Time   NA 139 11/30/2023 0556   K 3.6 11/30/2023 0556   CL 105 11/30/2023 0556   CO2 23 11/30/2023 0556   GLUCOSE 144 (H) 11/30/2023 0556   BUN 6 (L) 11/30/2023 0556   CREATININE 0.49 11/30/2023 0556   CALCIUM  8.9 11/30/2023 0556   GFRNONAA >60 11/30/2023 0556   GFRAA >60 02/05/2016 0651    INR    Component Value Date/Time   INR 0.9 11/29/2023 1412    No intake or output data in the 24 hours ending 11/30/23 1240   Assessment/plan:  73 y.o. 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  I have recommended aspirin  and statin she was  previously on Plavix  but baby aspirin  should be sufficient.  I will have her follow-up in a few months in my office with dedicated CT angio for further evaluation.   Shivam Mestas C. Sheree, MD [facility] Office: [phone] Pager: [phone]  11/30/2023 12:40 PM

## 2023-12-01 ENCOUNTER — Telehealth (HOSPITAL_COMMUNITY): Payer: Self-pay | Admitting: Pharmacy Technician

## 2023-12-01 ENCOUNTER — Other Ambulatory Visit (HOSPITAL_COMMUNITY): Payer: Self-pay

## 2023-12-01 DIAGNOSIS — W19XXXA Unspecified fall, initial encounter: Secondary | ICD-10-CM | POA: Diagnosis not present

## 2023-12-01 DIAGNOSIS — Y92009 Unspecified place in unspecified non-institutional (private) residence as the place of occurrence of the external cause: Secondary | ICD-10-CM | POA: Diagnosis not present

## 2023-12-01 DIAGNOSIS — I10 Essential (primary) hypertension: Secondary | ICD-10-CM | POA: Diagnosis not present

## 2023-12-01 LAB — RENAL FUNCTION PANEL
Albumin: 2.6 g/dL — ABNORMAL LOW (ref 3.5–5.0)
Anion gap: 11 (ref 5–15)
BUN: 15 mg/dL (ref 8–23)
CO2: 26 mmol/L (ref 22–32)
Calcium: 9 mg/dL (ref 8.9–10.3)
Chloride: 104 mmol/L (ref 98–111)
Creatinine, Ser: 0.61 mg/dL (ref 0.44–1.00)
GFR, Estimated: >60 mL/min (ref >60)
Glucose, Bld: 234 mg/dL — ABNORMAL HIGH (ref 70–99)
Phosphorus: 3.7 mg/dL (ref 2.5–4.6)
Potassium: 3.7 mmol/L (ref 3.5–5.1)
Sodium: 141 mmol/L (ref 135–145)

## 2023-12-01 LAB — GLUCOSE, CAPILLARY
Glucose-Capillary: 192 mg/dL — ABNORMAL HIGH (ref 70–99)
Glucose-Capillary: 347 mg/dL — ABNORMAL HIGH (ref 70–99)
Glucose-Capillary: 270 mg/dL — ABNORMAL HIGH (ref 70–99)
Glucose-Capillary: 233 mg/dL — ABNORMAL HIGH (ref 70–99)

## 2023-12-01 LAB — MAGNESIUM: Magnesium: 1.9 mg/dL (ref 1.7–2.4)

## 2023-12-01 NOTE — Progress Notes (Signed)
 PROGRESS NOTE    Allison Hughes  FMW:[id number] DOB: 02-Apr-1950 DOA: 11/29/2023 PCP: Valma Carwin, MD  Outpatient Specialists:     Brief Narrative:  Patient is a 73 year old female with past medical history significant for hypertension, hyperlipidemia, CVA, COPD, diabetes mellitus type 2, history of GI bleed and obesity.  Patient was found down at home.  Apparently, patient is failing to thrive at home.  PT/OT have recommended SNF placement.    12/01/2023: Patient seen.  No new complaints.  Pursue SNF placement.   Assessment & Plan:   Principal Problem:   Fall at home, initial encounter Active Problems:   Essential hypertension   DM (diabetes mellitus), type 2 (HCC)   History of stroke   Neurocognitive disorder   Chronic diastolic CHF (congestive heart failure) (HCC)   Hyperlipidemia   History of gastric ulcer   Acute encephalopathy   Aortic mural thrombus (HCC)   GERD (gastroesophageal reflux disease)   Fall at home -Patient presents after a fall at home.   - Patient lives alone at home.  Patient has a neighbor who is on her emergency contact list who comes to check on her.  However, patient neighbor is 31 years old and is unable to help her out whenever she falls.   - PT/OT have recommended SNF placement. - Consulted TOC. - Pursue SNF.     Acute encephalopathy Cognitive impairment It appears patient has baseline cognitive impairment for which she may not be safe to live at home alone.  She comes back to the hospital with similar fashion as her previous hospitalization and is altered unable to follow commands at this time - Delirium precautions - Ammonia is less than 13 - Continue Seroquel  50 mg twice daily   Mural thrombus Patient noted to have an aortic mural thrombus on CT imaging.  Vascular surgery consulted and recommended antiplatelet therapy with statin. - Continue atorvastatin  and added aspirin  81 mg daily per vascular surgery recommendations - Patient  will follow-up with vascular surgery team on discharge.   Essential hypertension Blood pressures currently maintained. - Continue lisinopril      Chronic diastolic congestive heart failure -Compensated.   Controlled diabetes mellitus type 2, with long-term use of insulin  -Continue subcutaneous Semglee  15 units at nighttime. -Sliding scale insulin  coverage.   Hyperlipidemia - Continue atorvastatin    History of CVA Patient with a history of CVA with last back in 2022. - Continue statin  History of gastric ulcer GERD - Continue Protonix    Obesity, class I BMI 33.1 kg/m     DVT prophylaxis: Subcutaneous Lovenox  Code Status: Full code Family Communication: Patient's neighbor by bedside Disposition Plan: Patient will likely need placement   Consultants:  Vascular surgery team.  Procedures:  None.  Antimicrobials:  None   Subjective: No history from patient  Objective: Vitals:   12/01/23 0044 12/01/23 0530 12/01/23 0752 12/01/23 0803  BP: (!) 153/79 127/69 121/69   Pulse: 76 82 82 79  Resp:   20 16  Temp: 98.3 F (36.8 C) 98.2 F (36.8 C) (!) 97.4 F (36.3 C)   TempSrc:      SpO2: 96% 94% 92% 92%  Weight:      Height:        Intake/Output Summary (Last 24 hours) at 12/01/2023 1203 Last data filed at 12/01/2023 0850 Gross per 24 hour  Intake 224 ml  Output --  Net 224 ml   Filed Weights   11/29/23 1509  Weight: 68 kg  Examination:  General exam: Appears calm and comfortable.  Sleepy. Respiratory system: Clear to auscultation. Respiratory effort normal. Cardiovascular system: S1 & S2  Gastrointestinal system: Abdomen is soft and nontender.  Central nervous system: Patient is sleepy.  Data Reviewed: I have personally reviewed following labs and imaging studies  CBC: Recent Labs  Lab 11/29/23 1412 11/29/23 1416 11/29/23 1756 11/30/23 0556  WBC 7.3  --   --  5.5  HGB 12.3 12.9 12.2 12.3  HCT 40.1 38.0 36.0 39.6  MCV 76.8*  --   --   77.2*  PLT 276  --   --  239   Basic Metabolic Panel: Recent Labs  Lab 11/29/23 1412 11/29/23 1416 11/29/23 1756 11/30/23 0556 12/01/23 0346  NA 140 140 141 139 141  K 3.8 3.9 3.9 3.6 3.7  CL 104 105  --  105 104  CO2 25  --   --  23 26  GLUCOSE 186* 182*  --  144* 234*  BUN 9 10  --  6* 15  CREATININE 0.60 0.70  --  0.49 0.61  CALCIUM  9.3  --   --  8.9 9.0  MG  --   --   --  1.6* 1.9  PHOS  --   --   --   --  3.7   GFR: Estimated Creatinine Clearance: 61.8 mL/min (by C-G formula based on SCr of 0.61 mg/dL). Liver Function Tests: Recent Labs  Lab 11/29/23 1412 12/01/23 0346  AST 26  --   ALT 33  --   ALKPHOS 145*  --   BILITOT 0.6  --   PROT 6.2*  --   ALBUMIN 2.9* 2.6*   No results for input(s): LIPASE, AMYLASE in the last 168 hours. Recent Labs  Lab 11/29/23 1845  AMMONIA <13   Coagulation Profile: Recent Labs  Lab 11/29/23 1412  INR 0.9   Cardiac Enzymes: Recent Labs  Lab 11/29/23 1412  CKTOTAL 38  CKMB 1.2   BNP (last 3 results) No results for input(s): PROBNP in the last 8760 hours. HbA1C: No results for input(s): HGBA1C in the last 72 hours. CBG: Recent Labs  Lab 11/29/23 2244 11/30/23 1202 11/30/23 1603 11/30/23 2032 12/01/23 0751  GLUCAP 151* 219* 150* 199* 192*   Lipid Profile: No results for input(s): CHOL, HDL, LDLCALC, TRIG, CHOLHDL, LDLDIRECT in the last 72 hours. Thyroid Function Tests: No results for input(s): TSH, T4TOTAL, FREET4, T3FREE, THYROIDAB in the last 72 hours. Anemia Panel: No results for input(s): VITAMINB12, FOLATE, FERRITIN, TIBC, IRON , RETICCTPCT in the last 72 hours. Urine analysis:    Component Value Date/Time   COLORURINE AMBER (A) 10/20/2023 1110   APPEARANCEUR HAZY (A) 10/20/2023 1110   LABSPEC 1.022 10/20/2023 1110   PHURINE 5.0 10/20/2023 1110   GLUCOSEU >=500 (A) 10/20/2023 1110   HGBUR SMALL (A) 10/20/2023 1110   BILIRUBINUR NEGATIVE 10/20/2023 1110    KETONESUR NEGATIVE 10/20/2023 1110   PROTEINUR 100 (A) 10/20/2023 1110   NITRITE NEGATIVE 10/20/2023 1110   LEUKOCYTESUR TRACE (A) 10/20/2023 1110   Sepsis Labs: @LABRCNTIP (procalcitonin:4,lacticidven:4)  ) Recent Results (from the past 240 hours)  Resp panel by RT-PCR (RSV, Flu A&B, Covid) Anterior Nasal Swab     Status: None   Collection Time: 11/29/23  3:56 PM   Specimen: Anterior Nasal Swab  Result Value Ref Range Status   SARS Coronavirus 2 by RT PCR NEGATIVE NEGATIVE Final   Influenza A by PCR NEGATIVE NEGATIVE Final   Influenza B by PCR  NEGATIVE NEGATIVE Final    Comment: (NOTE) The Xpert Xpress SARS-CoV-2/FLU/RSV plus assay is intended as an aid in the diagnosis of influenza from Nasopharyngeal swab specimens and should not be used as a sole basis for treatment. Nasal washings and aspirates are unacceptable for Xpert Xpress SARS-CoV-2/FLU/RSV testing.  Fact Sheet for Patients: bloggercourse.com  Fact Sheet for Healthcare Providers: seriousbroker.it  This test is not yet approved or cleared by the [place]  FDA and has been authorized for detection and/or diagnosis of SARS-CoV-2 by FDA under an Emergency Use Authorization (EUA). This EUA will remain in effect (meaning this test can be used) for the duration of the COVID-19 declaration under Section 564(b)(1) of the Act, 21 U.S.C. section 360bbb-3(b)(1), unless the authorization is terminated or revoked.     Resp Syncytial Virus by PCR NEGATIVE NEGATIVE Final    Comment: (NOTE) Fact Sheet for Patients: bloggercourse.com  Fact Sheet for Healthcare Providers: seriousbroker.it  This test is not yet approved or cleared by the [place]  FDA and has been authorized for detection and/or diagnosis of SARS-CoV-2 by FDA under an Emergency Use Authorization (EUA). This EUA will remain in effect (meaning this test can  be used) for the duration of the COVID-19 declaration under Section 564(b)(1) of the Act, 21 U.S.C. section 360bbb-3(b)(1), unless the authorization is terminated or revoked.  Performed at [facility], [address] [place], [place] [address]          Radiology Studies: CT CHEST ABDOMEN PELVIS W CONTRAST Result Date: 11/29/2023 EXAM: CT CHEST, ABDOMEN AND PELVIS WITH CONTRAST 11/29/2023 02:43:03 PM TECHNIQUE: CT of the chest, abdomen and pelvis was performed with the administration of intravenous contrast. 75 mL of iohexol  (OMNIPAQUE ) 350 MG/ML injection was administered. Multiplanar reformatted images are provided for review. Automated exposure control, iterative reconstruction, and/or weight based adjustment of the mA/kV was utilized to reduce the radiation dose to as low as reasonably achievable. COMPARISON: 09/19/2023 CLINICAL HISTORY: Polytrauma, blunt FINDINGS: CHEST: MEDIASTINUM AND LYMPH NODES: Scattered plaque through the thoracic aorta with some eccentric nonocclusive mural thrombus in the mid and distal descending segment, possible embolic risk. Heart and pericardium are unremarkable. Scattered coronary calcifications. The central airways are clear. No mediastinal, hilar or axillary lymphadenopathy. LUNGS AND PLEURA: Pulmonary ground glass opacities most conspicuous posteriorly in the lung bases but also seen in the upper lobes, left worse than right, showing some decrease since previous study. Septal thickening has significantly improved. No pleural effusion or pneumothorax. ABDOMEN AND PELVIS: LIVER: The liver is unremarkable. GALLBLADDER AND BILE DUCTS: Innumerable subcentimeter partially calcified gallstones layer in the dependent aspect of the nondilated gallbladder. No biliary ductal dilatation. SPLEEN: No acute abnormality. PANCREAS: No acute abnormality. ADRENAL GLANDS: No acute abnormality. KIDNEYS, URETERS AND BLADDER: No stones in the kidneys or ureters. No  hydronephrosis. No perinephric or periureteral stranding. Urinary bladder is unremarkable. GI AND BOWEL: Stomach demonstrates no acute abnormality. There is no bowel obstruction. REPRODUCTIVE ORGANS: No acute abnormality. PERITONEUM AND RETROPERITONEUM: No ascites. No free air. VASCULATURE: Abdominal aorta is normal in caliber. ABDOMINAL AND PELVIS LYMPH NODES: No lymphadenopathy. 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 Bilateral pelvic phleboliths. No focal soft tissue abnormality. IMPRESSION: 1. No acute findings. 2. Ground glass opacities in the lung bases and upper lobes, left worse than right, decreased from prior, with improved septal thickening. 3. Eccentric nonocclusive mural thrombus within the mid to distal descending thoracic aorta, potential embolic  source; consider vascular consultation re antithrombotic management. Electronically signed by: Katheleen Faes MD 11/29/2023 02:54 PM EST RP Workstation: [id number]   CT CERVICAL SPINE WO CONTRAST Result Date: 11/29/2023 CLINICAL DATA:  Blunt trauma EXAM: CT CERVICAL SPINE WITHOUT CONTRAST TECHNIQUE: Multidetector CT imaging of the cervical spine was performed without intravenous contrast. Multiplanar CT image reconstructions were also generated. RADIATION DOSE REDUCTION: This exam was performed according to the departmental dose-optimization program which includes automated exposure control, adjustment of the mA and/or kV according to patient size and/or use of iterative reconstruction technique. COMPARISON:  10/20/2023 FINDINGS: Alignment: Normal. Skull base and vertebrae: No acute fracture. No primary bone lesion or focal pathologic process. Soft tissues and spinal canal: No prevertebral fluid or swelling. No visible canal hematoma. Disc levels: Moderate spondylosis at C4-5 and C5-6, with stable mild central canal stenosis and bilateral right greater than left neural  foraminal encroachment at those levels. Mild C6-7 spondylosis. Upper chest: Airway is patent. Emphysematous changes are seen at the lung apices. Other: Reconstructed images demonstrate no additional findings. IMPRESSION: 1. No acute cervical spine fracture. 2. Stable spondylosis and neural foraminal encroachment most pronounced at C4-5 and C5-6. 3. Pulmonary emphysema is present. Pulmonary emphysema is an independent risk factor for lung cancer. Recommend patient be considered for lung cancer screening. [place]  Chief Financial Officer. Screening for Lung Cancer: [place]  Licensed Conveyancer. JAMA. 2021;325(10):962-970. Electronically Signed   By: Ozell Daring M.D.   On: 11/29/2023 14:51   CT HEAD WO CONTRAST Result Date: 11/29/2023 CLINICAL DATA:  Moderate to severe head trauma, fell EXAM: CT HEAD WITHOUT CONTRAST TECHNIQUE: Contiguous axial images were obtained from the base of the skull through the vertex without intravenous contrast. RADIATION DOSE REDUCTION: This exam was performed according to the departmental dose-optimization program which includes automated exposure control, adjustment of the mA and/or kV according to patient size and/or use of iterative reconstruction technique. COMPARISON:  10/20/2023 FINDINGS: Brain: Stable chronic ischemic changes throughout the periventricular and subcortical white matter as well as left cerebellar hemisphere. No evidence of acute infarct or hemorrhage. The lateral ventricles and midline structures are otherwise stable. No acute extra-axial fluid collections. No mass effect. Vascular: No hyperdense vessel or unexpected calcification. Skull: Normal. Negative for fracture or focal lesion. Sinuses/Orbits: No acute finding. Other: None. IMPRESSION: 1. No acute intracranial process. 2. Stable chronic ischemic changes as above. Electronically Signed   By: Ozell Daring M.D.   On: 11/29/2023 14:47   DG Chest Port 1 View Result Date:  11/29/2023 CLINICAL DATA:  Found on floor at home by friends. EXAM: PORTABLE CHEST 1 VIEW COMPARISON:  10/20/2023 FINDINGS: Loop recorder projects over the left heart. Lungs are somewhat hypoinflated without lobar consolidation or effusion. Stone silhouette and remainder of the exam is unchanged. IMPRESSION: Hypoinflation without acute cardiopulmonary disease. Electronically Signed   By: Toribio Agreste M.D.   On: 11/29/2023 14:10   DG Pelvis Portable Result Date: 11/29/2023 CLINICAL DATA:  Clemens, found down EXAM: PORTABLE PELVIS 1-2 VIEWS COMPARISON:  10/20/2023 FINDINGS: Supine frontal view of the pelvis and bilateral hips demonstrates no acute displaced fracture, subluxation, or dislocation. Joint spaces are well preserved. Sacroiliac joints are normal. IMPRESSION: 1. Unremarkable pelvis and bilateral hips. Electronically Signed   By: Ozell Daring M.D.   On: 11/29/2023 14:01        Scheduled Meds:  aspirin  EC  81 mg Oral Daily   atorvastatin   40 mg Oral Daily   budesonide -glycopyrrolate -formoterol   2 puff  Inhalation BID   cyanocobalamin   500 mcg Oral Daily   enoxaparin  (LOVENOX ) injection  40 mg Subcutaneous Q24H   insulin  aspart  0-9 Units Subcutaneous TID WC   insulin  glargine-yfgn  15 Units Subcutaneous QHS   lisinopril   30 mg Oral QHS   magnesium  chloride  1 tablet Oral Daily   pantoprazole   40 mg Oral Daily   potassium chloride  SA  20 mEq Oral Daily   Ensure Max Protein  11 oz Oral BID   QUEtiapine   50 mg Oral QHS   QUEtiapine   50 mg Oral q morning   sodium chloride  flush  3 mL Intravenous Q12H   Continuous Infusions:     LOS: 0 days    Time spent: 35 minutes.    Leatrice Chapel, MD  [facility] Pager #: [phone] 7PM-7AM contact night coverage as above

## 2023-12-01 NOTE — Plan of Care (Signed)

## 2023-12-01 NOTE — Progress Notes (Signed)
 Mobility Specialist: Progress Note   12/01/23 1500  Mobility  Activity Pivoted/transferred from bed to chair  Level of Assistance Maximum assist, patient does 25-49%  Assistive Device Other (Comment) (f34f stand pivot)  Activity Response Tolerated well  Mobility Referral Yes  Mobility visit 1 Mobility  Mobility Specialist Start Time (ACUTE ONLY) 1000  Mobility Specialist Stop Time (ACUTE ONLY) 1020  Mobility Specialist Time Calculation (min) (ACUTE ONLY) 20 min    Pt received in bed, agreeable to mobility session. ModA for bed mobility to assist with scooting. Requiring max cues for redirection and hand placement. Very flexed posture throughout session while sitting EOB and during stand pivot, unable to correct even with cues. MaxA for STS, attempted to use RW to transfer but pt not following directions or cues for RW proximity. Stand back on EOB and completed transfer with face-to-face stand pivot. Left in chair with all needs met, call bell in reach. Chair alarm on. RN aware.   [name] Mobility Specialist Please contact via SecureChat or Rehab office at [phone]

## 2023-12-01 NOTE — Telephone Encounter (Signed)
 Patient Product/process Development Scientist completed.    The patient is insured through [facility]. Patient has Medicare and is not eligible for a copay card, but may be able to apply for patient assistance or Medicare RX Payment Plan (Patient Must reach out to their plan, if eligible for payment plan), if available.    Ran test claim for Brez[employer]  and the current 30 day co-pay is $0.00.   This test claim was processed through [facility]- copay amounts may vary at other pharmacies due to pharmacy/plan contracts, or as the patient moves through the different stages of their insurance plan.     Reyes Sharps, CPHT Pharmacy Technician Patient Advocate Specialist Lead [facility] Patient Advocate Team Direct Number: [phone]  Fax: [phone]

## 2023-12-01 NOTE — Plan of Care (Signed)

## 2023-12-01 NOTE — Progress Notes (Signed)
 Mobility Specialist: Progress Note   12/01/23 1540  Mobility  Activity Pivoted/transferred from chair to bed  Level of Assistance Moderate assist, patient does 50-74%  Assistive Device Front wheel walker  Activity Response Tolerated well  Mobility Referral Yes  Mobility visit 1 Mobility  Mobility Specialist Start Time (ACUTE ONLY) 1145  Mobility Specialist Stop Time (ACUTE ONLY) 1155  Mobility Specialist Time Calculation (min) (ACUTE ONLY) 10 min    Pt received in chair. ModA for STS and stand pivot to bed with RW. Pt prematurely sitting onto the edge of the bed requiring modA to guide her bottom and scoot back. MaxA for sit>supine with cues needed for redirection. TotA+2 to slide up in bed. Left in bed with all needs met, call bell in reach. Bed alarm on.   [name] Mobility Specialist Please contact via SecureChat or Rehab office at [phone]

## 2023-12-02 LAB — GLUCOSE, CAPILLARY
Glucose-Capillary: 201 mg/dL — ABNORMAL HIGH (ref 70–99)
Glucose-Capillary: 385 mg/dL — ABNORMAL HIGH (ref 70–99)
Glucose-Capillary: 231 mg/dL — ABNORMAL HIGH (ref 70–99)
Glucose-Capillary: 259 mg/dL — ABNORMAL HIGH (ref 70–99)

## 2023-12-02 MED ORDER — INSULIN ASPART 100 UNIT/ML IJ SOLN
0.0000 [IU] | Freq: Three times a day (TID) | INTRAMUSCULAR | Status: DC
  Administered 2023-12-03: 15 [IU] via SUBCUTANEOUS
  Administered 2023-12-03 – 2023-12-04 (×2): 5 [IU] via SUBCUTANEOUS
  Filled 2023-12-02: qty 15
  Filled 2023-12-02: qty 5
  Filled 2023-12-02: qty 3
  Filled 2023-12-02: qty 5
  Filled 2023-12-02: qty 15

## 2023-12-02 NOTE — Progress Notes (Signed)
 PROGRESS NOTE    MARKESIA CRILLY  FMW:[id number]  DOB: [birth year]  DOA: 11/29/2023 PCP: Valma Carwin, MD Outpatient Specialists:   Hospital course: Patient is a 73 year old female with past medical history significant for hypertension, hyperlipidemia, CVA, COPD, diabetes mellitus type 2, history of GI bleed and obesity is readmitted with fall and altered mental status possibly with underlying cognitive impairment.  Of note patient has been admitted 6 times, every month over the past 6 months, with similar complaints.  Each time patient is declining to go to SNF.   Subjective:  Patient states she would like to go home again.  Patient feels she does a good job taking care of herself and does not understand why she keeps on getting readmitted.   Objective: Vitals:   12/02/23 0744 12/02/23 0817 12/02/23 1206 12/02/23 1639  BP:  124/71 (!) 104/56 (!) 145/71  Pulse: 82 85 (!) 105 90  Resp: 16 20 20 20   Temp:  (!) 97.5 F (36.4 C) 98.5 F (36.9 C) 97.8 F (36.6 C)  TempSrc:      SpO2: 92% 95% 95% 98%  Weight:      Height:        Intake/Output Summary (Last 24 hours) at 12/02/2023 1703 Last data filed at 12/02/2023 0900 Gross per 24 hour  Intake 460 ml  Output 400 ml  Net 60 ml   Filed Weights   11/29/23 1509  Weight: 68 kg     Exam:  General: Patient sitting up in bed in NAD Eyes: sclera anicteric, conjuctiva mild injection bilaterally CVS: S1-S2, regular  Respiratory:  decreased air entry bilaterally secondary to decreased inspiratory effort, rales at bases  GI: NABS, soft, NT  LE: Warm and well-perfused Neuro: Alert and oriented to person place and time Psych: Extremely poor insight, unclear how much of her disease process that she understands.   Data Reviewed:  Basic Metabolic Panel: Recent Labs  Lab 11/29/23 1412 11/29/23 1416 11/29/23 1756 11/30/23 0556 12/01/23 0346  NA 140 140 141 139 141  K 3.8 3.9 3.9 3.6 3.7  CL 104 105  --  105 104   CO2 25  --   --  23 26  GLUCOSE 186* 182*  --  144* 234*  BUN 9 10  --  6* 15  CREATININE 0.60 0.70  --  0.49 0.61  CALCIUM  9.3  --   --  8.9 9.0  MG  --   --   --  1.6* 1.9  PHOS  --   --   --   --  3.7    CBC: Recent Labs  Lab 11/29/23 1412 11/29/23 1416 11/29/23 1756 11/30/23 0556  WBC 7.3  --   --  5.5  HGB 12.3 12.9 12.2 12.3  HCT 40.1 38.0 36.0 39.6  MCV 76.8*  --   --  77.2*  PLT 276  --   --  239     Scheduled Meds:  aspirin  EC  81 mg Oral Daily   atorvastatin   40 mg Oral Daily   budesonide -glycopyrrolate -formoterol   2 puff Inhalation BID   cyanocobalamin   500 mcg Oral Daily   enoxaparin  (LOVENOX ) injection  40 mg Subcutaneous Q24H   insulin  aspart  0-9 Units Subcutaneous TID WC   insulin  glargine-yfgn  15 Units Subcutaneous QHS   lisinopril   30 mg Oral QHS   magnesium  chloride  1 tablet Oral Daily   pantoprazole   40 mg Oral Daily   potassium chloride  SA  20 mEq Oral Daily   Ensure Max Protein  11 oz Oral BID   QUEtiapine   50 mg Oral QHS   QUEtiapine   50 mg Oral q morning   sodium chloride  flush  3 mL Intravenous Q12H   Continuous Infusions:   Assessment & Plan:   Recurrent falls at home Recurrent acute metabolic encephalopathy--now resolved 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 Patient is not married and has no children Patient would like to go home again but clearly this has not been sustainable over the past 6 months Continue Seroquel  50 mg twice daily  DM 2 Blood sugars are not optimally controlled Continue glargine 50 units daily Increase SSI from sensitive doses to moderate dosing  Mural  thrombus Found to have aortic mural thrombus on CT Vascular surgery consulted and recommended antiplatelet therapy and statin therapy Continue aspirin  and atorvastatin  per vascular surgery recommendations Follow-up with them after discharge  HTN Continue lisinopril   HFpEF Presently euvolemic, continue present meds    DVT prophylaxis: Lovenox  Code Status: Full Family Communication: No one at bedside     Studies: No results found.  Principal Problem:   Fall at home, initial encounter Active Problems:   Neurocognitive disorder   Acute encephalopathy   Aortic mural thrombus (HCC)   Essential hypertension   Chronic diastolic CHF (congestive heart failure) (HCC)   DM (diabetes mellitus), type 2 (HCC)   Hyperlipidemia   History of stroke   History of gastric ulcer   GERD (gastroesophageal reflux disease)     Nickie Warwick Tublu Jedadiah Abdallah, [facility]  If 7PM-7AM, please contact night-coverage [email or web address]   LOS: 0 days

## 2023-12-02 NOTE — TOC Initial Note (Addendum)
 Transition of Care [facility]) - Initial/Assessment Note    Patient Details  Name: Allison Hughes MRN: [id number] Date of Birth: 1950/07/29  Transition of Care [facility]) CM/SW Contact:    [name] Clack, LCSWA Phone Number: 12/02/2023, 4:33 PM  Clinical Narrative:                  Update 4:47 PM: CSW received phone call from [facility]; patient meets requirements for a case. APS social worker will be visiting patient in hospital.  CSW spoke with APS social worker Yolanda regarding patient's discharge plan following request from the provider. CSW answered intake questions and provided Yolanda with patient's friend's information Harriett Cong [phone]) for collateral. Myrick wrote down CSW's phone number for follow up. CSW answered questions about patient's living situation, ability to care for herself, and next steps. CSW will continue to follow.           Patient Goals and CMS Choice            Expected Discharge Plan and Services                                              Prior Living Arrangements/Services                       Activities of Daily Living   ADL Screening (condition at time of admission) Independently performs ADLs?: No Does the patient have a NEW difficulty with bathing/dressing/toileting/self-feeding that is expected to last >3 days?: Yes (Initiates electronic notice to provider for possible OT consult) Does the patient have a NEW difficulty with getting in/out of bed, walking, or climbing stairs that is expected to last >3 days?: Yes (Initiates electronic notice to provider for possible PT consult) Does the patient have a NEW difficulty with communication that is expected to last >3 days?: Yes (Initiates electronic notice to provider for possible SLP consult) Is the patient deaf or have difficulty hearing?: No Does the patient have difficulty seeing, even when wearing glasses/contacts?: No Does the patient have  difficulty concentrating, remembering, or making decisions?: No  Permission Sought/Granted                  Emotional Assessment              Admission diagnosis:  Acute encephalopathy [G93.40] Patient Active Problem List   Diagnosis Date Noted   Acute encephalopathy 11/29/2023   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   GERD (gastroesophageal reflux disease) 11/29/2023   Chronic diastolic CHF (congestive heart failure) (HCC) 10/20/2023   Fall at home, initial encounter 10/20/2023   Hyperlipidemia 10/20/2023   History of gastric ulcer 10/20/2023   ABLA (acute blood loss anemia) 09/19/2023   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 [place] [address] Phone: [phone] Fax: [phone]  [facility] - [place], [place] - [address] [place]  [place] [address] Phone: [phone] Fax: [phone]  [facility] [address] [place] [address] Phone: [phone] Fax: [phone]     Social Drivers of Health (SDOH) Social History: SDOH Screenings   Food Insecurity: Patient Unable To Answer (11/29/2023)  Housing: Patient Unable To Answer (11/29/2023)  Transportation Needs: Patient Unable To Answer (11/29/2023)  Utilities: Patient Unable To Answer (11/29/2023)  Depression (PHQ2-9): Low Risk  (10/26/2020)  Social Connections: Patient Unable To Answer (11/29/2023)  Tobacco Use: High Risk (11/29/2023)   SDOH Interventions:      Readmission Risk Interventions    10/24/2023    2:14 PM  Readmission Risk Prevention Plan  Transportation Screening Complete  PCP or Specialist Appt within 3-5 Days Complete  HRI or Home Care Consult Complete  Social Work Consult for Recovery Care Planning/Counseling Complete  Palliative Care Screening Complete  Medication Review Oceanographer) Referral to Pharmacy

## 2023-12-02 NOTE — Progress Notes (Addendum)
 Transition of Care [facility]) - Inpatient Brief Assessment   Patient Details  Name: Allison Hughes MRN: [id number] Date of Birth: 1950/07/12  [facility] CM/SW Contact:    [name], RN Phone Number: 12/02/2023, 2:34 PM   Clinical Narrative: CM met with the patient and friend, [name] at the bedside.  Patient lives alone and her friend, [name] provides assistance at the home 7 days a week from 8 am to 6 pm.  [name] states that patient does not have a legal guardian.  Patient was evaluated by PT and home health is recommended.  Patient and friend do not have a preference regarding home health agency.  [facility] was called and patient was set up with [facility] services for MSW and PT.  Phil requested bedside commode - no preference for DME agency.  [employer] was called and they will deliver 3:1 to the bedside.  MSW to follow up with the patient's friend, [name] regarding obtaining legal guardian in the community.  12/02/2023  1536 - Patient was seen by PT and patient will need 24 hour supervision at the home for safety.  [name] will follow up with APS to discuss patient's cognitive decline and need for 24 hour supervision for safety.   Transition of Care Asessment: Insurance and Status: (P) Insurance coverage has been reviewed Patient has primary care physician: (P) Yes Home environment has been reviewed: (P) from home alone - friend provides supervision during the day - 7 days a week from 8 am to 6 pm Prior level of function:: (P) slef Prior/Current Home Services: (P) No current home services Social Drivers of Health Review: (P) SDOH reviewed interventions complete Readmission risk has been reviewed: (P) Yes Transition of care needs: (P) transition of care needs identified, TOC will continue to follow

## 2023-12-02 NOTE — Inpatient Diabetes Management (Signed)
 Inpatient Diabetes Program Recommendations  AACE/ADA: New Consensus Statement on Inpatient Glycemic Control   Target Ranges:  Prepandial:   less than 140 mg/dL      Peak postprandial:   less than 180 mg/dL (1-2 hours)      Critically ill patients:  140 - 180 mg/dL   Lab Results  Component Value Date   GLUCAP 201 (H) 12/02/2023   HGBA1C 6.9 (H) 09/20/2023    Latest Reference Range & Units 12/01/23 07:51 12/01/23 12:45 12/01/23 16:14 12/01/23 20:46 12/02/23 08:17  Glucose-Capillary 70 - 99 mg/dL 807 (H) 652 (H) 729 (H) 233 (H) 201 (H)   Review of Glycemic Control  Diabetes history: DM2  Outpatient Diabetes medications:  Metformin  500mg  daily  Tresiba  15 units daily   Current orders for Inpatient glycemic control:  Semglee  15 units daily  Novolog  0-9 units TID   Inpatient Diabetes Program Recommendations:   Please consider adding Novolog  3 units TID with meals (if patient consumes atleast 50%of meals).   Thanks,  Lavanda Search, RN, MSN, [facility]  Inpatient Diabetes Coordinator  Pager [phone] (8a-5p)

## 2023-12-02 NOTE — Progress Notes (Addendum)
 Physical Therapy Treatment Patient Details Name: Allison Hughes MRN: [id number] DOB: [birth year] Today's Date: 12/02/2023   History of Present Illness 73 y/o F presenting to ED on 11/15 after being found down at home. 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    Recent hospitalization 10/6-10/28 after being found down, encephalopathic in setting of UTI with respiratory failure 2/2 hypoxia and COVID 19, went to [facility]    PMH includes HTN, HLD, CVA, COPD, DM2, GIB, neuroendocrine ulcer    PT Comments  Pt tolerates treatment well, progressing to ambulation of short household distances. Pt appears to gradually sink into knee flexion with increased ambulation distance despite no noticeable buckling. Pt remains at a high risk for falls during transfers as she consistently begins to sit prior to completing the turn to the desired surface, requiring PT physical assistance at hips to land on the commode and recliner with 2 separate transfers during this session. Pt and pt's primary caregiver Harriett) express refusal potential inpatient PT placement and prefer to discharge home with HHPT. Pt is also eager to return home. Pt would benefit from a transport chair and a BSC, along with 24/7 caregiver support due to lack of awareness of deficits if returning home.    If plan is discharge home, recommend the following: A little help with walking and/or transfers;A lot of help with bathing/dressing/bathroom;Assistance with cooking/housework;Direct supervision/assist for medications management;Direct supervision/assist for financial management;Assist for transportation;Help with stairs or ramp for entrance;Supervision due to cognitive status   Can travel by private vehicle     Yes  Equipment Recommendations  Other (comment);BSC/3in1 (transport chair)    Recommendations for Other Services       Precautions / Restrictions  Precautions Precautions: Fall Recall of Precautions/Restrictions: Impaired Precaution/Restrictions Comments: AMS Restrictions Weight Bearing Restrictions Per Provider Order: No     Mobility  Bed Mobility Overal bed mobility: Needs Assistance Bed Mobility: Supine to Sit     Supine to sit: Contact guard, HOB elevated, Used rails          Transfers Overall transfer level: Needs assistance Equipment used: Rolling walker (2 wheels) Transfers: Sit to/from Stand Sit to Stand: Min assist           General transfer comment: pt requires assistance during stand to sit when approachign desired destination. Pt turns half way around and then begins to descend to the seat while still pivoting to finish transfer, nearly missing commode and recliner twice during this session    Ambulation/Gait Ambulation/Gait assistance: Contact guard assist Gait Distance (Feet): 25 Feet (additional trial of 15') Assistive device: Rolling walker (2 wheels) Gait Pattern/deviations: Step-to pattern, Knee flexed in stance - right, Knee flexed in stance - left Gait velocity: reduced Gait velocity interpretation: <1.31 ft/sec, indicative of household ambulator   General Gait Details: pt with slowed step-to gait, reduced foot clearance bilaterally. Pt appears to gradually sink into more knee flexion with progression of gait distance although no buckling noted   Stairs             Wheelchair Mobility     Tilt Bed    Modified Rankin (Stroke Patients Only)       Balance Overall balance assessment: Needs assistance Sitting-balance support: No upper extremity supported, Feet supported Sitting balance-Leahy Scale: Good     Standing balance support: Bilateral upper extremity supported, Reliant on assistive device for balance Standing balance-Leahy Scale: Poor  Communication Communication Communication: Impaired Factors Affecting Communication:  Hearing impaired  Cognition Arousal: Alert Behavior During Therapy: Impulsive   PT - Cognitive impairments: History of cognitive impairments, Memory, Awareness, Safety/Judgement                         Following commands: Intact      Cueing Cueing Techniques: Verbal cues, Gestural cues, Visual cues  Exercises      General Comments General comments (skin integrity, edema, etc.): pt in NAD, on room air upon PT arrival      Pertinent Vitals/Pain Pain Assessment Pain Assessment: No/denies pain    Home Living                          Prior Function            PT Goals (current goals can now be found in the care plan section) Acute Rehab PT Goals Patient Stated Goal: to go home Progress towards PT goals: Progressing toward goals    Frequency    Min 2X/week      PT Plan      Co-evaluation              AM-PAC PT 6 Clicks Mobility   Outcome Measure  Help needed turning from your back to your side while in a flat bed without using bedrails?: A Little Help needed moving from lying on your back to sitting on the side of a flat bed without using bedrails?: A Little Help needed moving to and from a bed to a chair (including a wheelchair)?: A Little Help needed standing up from a chair using your arms (e.g., wheelchair or bedside chair)?: A Little Help needed to walk in hospital room?: A Little Help needed climbing 3-5 steps with a railing? : Total 6 Click Score: 16    End of Session Equipment Utilized During Treatment: Gait belt Activity Tolerance: Patient tolerated treatment well Patient left: in chair;with call bell/phone within reach;with chair alarm set Nurse Communication: Mobility status PT Visit Diagnosis: Muscle weakness (generalized) (M62.81);Other abnormalities of gait and mobility (R26.89)     Time: 8860-8792 PT Time Calculation (min) (ACUTE ONLY): 28 min  Charges:    $Gait Training: 8-22 mins $Therapeutic Activity: 8-22  mins PT General Charges $$ ACUTE PT VISIT: 1 Visit                     Bernardino JINNY Ruth, PT, DPT Acute Rehabilitation Office [phone]    Bernardino JINNY Ruth 12/02/2023, 1:04 PM

## 2023-12-03 DIAGNOSIS — G9340 Encephalopathy, unspecified: Secondary | ICD-10-CM | POA: Diagnosis not present

## 2023-12-03 DIAGNOSIS — W19XXXA Unspecified fall, initial encounter: Secondary | ICD-10-CM | POA: Diagnosis not present

## 2023-12-03 DIAGNOSIS — Y92009 Unspecified place in unspecified non-institutional (private) residence as the place of occurrence of the external cause: Secondary | ICD-10-CM | POA: Diagnosis not present

## 2023-12-03 LAB — URINALYSIS, ROUTINE W REFLEX MICROSCOPIC
Bacteria, UA: NONE SEEN
Bilirubin Urine: NEGATIVE
Glucose, UA: >=500 mg/dL — AB
Hgb urine dipstick: NEGATIVE
Ketones, ur: NEGATIVE mg/dL
Leukocytes,Ua: NEGATIVE
Nitrite: NEGATIVE
Protein, ur: NEGATIVE mg/dL
Specific Gravity, Urine: 1.015 (ref 1.005–1.030)
pH: 7 (ref 5.0–8.0)

## 2023-12-03 LAB — GLUCOSE, CAPILLARY
Glucose-Capillary: 225 mg/dL — ABNORMAL HIGH (ref 70–99)
Glucose-Capillary: 384 mg/dL — ABNORMAL HIGH (ref 70–99)
Glucose-Capillary: 412 mg/dL — ABNORMAL HIGH (ref 70–99)
Glucose-Capillary: 246 mg/dL — ABNORMAL HIGH (ref 70–99)
Glucose-Capillary: 102 mg/dL — ABNORMAL HIGH (ref 70–99)
Glucose-Capillary: 109 mg/dL — ABNORMAL HIGH (ref 70–99)
Glucose-Capillary: 196 mg/dL — ABNORMAL HIGH (ref 70–99)

## 2023-12-03 LAB — RAPID URINE DRUG SCREEN, HOSP PERFORMED
Amphetamines: NOT DETECTED
Barbiturates: NOT DETECTED
Benzodiazepines: POSITIVE — AB
Cocaine: NOT DETECTED
Opiates: NOT DETECTED
Tetrahydrocannabinol: NOT DETECTED

## 2023-12-03 MED ORDER — INSULIN ASPART 100 UNIT/ML IJ SOLN
15.0000 [IU] | INTRAMUSCULAR | Status: AC
  Administered 2023-12-03: 15 [IU] via SUBCUTANEOUS

## 2023-12-03 NOTE — Inpatient Diabetes Management (Signed)
 Inpatient Diabetes Program Recommendations  AACE/ADA: New Consensus Statement on Inpatient Glycemic Control (2015)  Target Ranges:  Prepandial:   less than 140 mg/dL      Peak postprandial:   less than 180 mg/dL (1-2 hours)      Critically ill patients:  140 - 180 mg/dL   Lab Results  Component Value Date   GLUCAP 225 (H) 12/03/2023   HGBA1C 6.9 (H) 09/20/2023    Review of Glycemic Control  Latest Reference Range & Units 12/02/23 08:17 12/02/23 12:05 12/02/23 16:40 12/02/23 20:13 12/03/23 08:29  Glucose-Capillary 70 - 99 mg/dL 798 (H) 614 (H) 768 (H) 259 (H) 225 (H)   Diabetes history: DM 2 Outpatient Diabetes medications:  Metformin  500 mg q PM Tresiba  15 units daily Current orders for Inpatient glycemic control:  Novolog  0-15 units tid with meals  Semglee  15 units q HS  Inpatient Diabetes Program Recommendations:    May consider increasing Semglee  to 20 units q HS.  Also consider adding Novolog  3 units tid with meals (hold if patient eats less than 50% or NPO).   Thanks , Randall Bullocks, RN, BC-ADM Inpatient Diabetes Coordinator Pager [phone]  (8a-5p)

## 2023-12-03 NOTE — Progress Notes (Signed)
 Progress Note    Allison Hughes   FMW:[id number]  DOB: 06-11-1950  DOA: 11/29/2023     0 PCP: Valma Carwin, MD  Initial CC: AMS  Hospital Course: Allison Hughes is a 73 year old female with past medical history significant for hypertension, hyperlipidemia, CVA, COPD, diabetes mellitus type 2, history of GI bleed and obesity is readmitted with fall and altered mental status possibly with underlying cognitive impairment.   Of note patient has been admitted 6 times, every month over the past 6 months, with similar complaints.  Each time patient is declining to go to SNF.   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  Patient improved somewhat from a mentation  standpoint; also hard of hearing.  Glucose levels elevated after she drank a chocolate drink this morning. She still declines going to any short-term rehab and prefers to go home at discharge.  Allison Hughes is comfortable bringing her home tomorrow.   Antimicrobials:   DVT prophylaxis:  enoxaparin  (LOVENOX ) injection 40 mg Start: 11/29/23 2000   Code Status:   Code Status: Full Code  Mobility Assessment (Last 72 Hours)     Mobility Assessment     Row Name 12/03/23 1100 12/03/23 1033 12/02/23 2200 12/02/23 1207 12/02/23 0835   Does the patient have exclusion criteria? -- No - Perform mobility assessment No - Perform mobility assessment -- No - Perform mobility assessment   What is the highest level of mobility based on the mobility assessment? Level 4 (Ambulates with assistance) - Balance while stepping forward/back - Complete Level 1 (Bedfast) - Unable to balance while sitting on edge of bed Level 1 (Bedfast) - Unable to balance while sitting on edge of bed Level 4 (Ambulates with assistance) - Balance while stepping forward/back - Complete Level 4 (Ambulates with assistance) - Balance while stepping forward/back - Complete   Is the above level different from baseline mobility prior to current illness? -- -- Yes - Recommend PT order -- Yes - Recommend PT order    Row Name 12/02/23 0531 12/02/23 0327 12/02/23 0152 12/01/23 2318 12/01/23 2259   Does the patient have exclusion criteria? No - Perform mobility assessment No - Perform mobility assessment No - Perform mobility assessment No - Perform mobility assessment --   What is the highest level of mobility based on the mobility assessment? Level 4 (  Ambulates with assistance) - Balance while stepping forward/back - Complete Level 4 (Ambulates with assistance) - Balance while stepping forward/back - Complete Level 4 (Ambulates with assistance) - Balance while stepping forward/back - Complete Level 4 (Ambulates with assistance) - Balance while stepping  forward/back - Complete --   Is the above level different from baseline mobility prior to current illness? Yes - Recommend PT order Yes - Recommend PT order Yes - Recommend PT order Yes - Recommend PT order --    Row Name 12/01/23 2007 12/01/23 1130 12/01/23 0850 12/01/23 0529 12/01/23 0440   Does the patient have exclusion criteria? No - Perform mobility assessment No - Perform mobility assessment No - Perform mobility assessment No - Perform mobility assessment No - Perform mobility assessment   What is the highest level of mobility based on the mobility assessment? Level 4 (Ambulates with assistance) - Balance while stepping forward/back - Complete Level 3 (Stands with assistance) - Balance while standing  and cannot march in place Level 4 (Ambulates with assistance) - Balance while stepping forward/back - Complete Level 4 (Ambulates with assistance) - Balance while stepping forward/back - Complete Level 4 (Ambulates with assistance) - Balance while stepping forward/back - Complete   Is the above level different from baseline mobility prior to current illness? Yes - Recommend PT order Yes - Recommend PT order Yes - Recommend PT order Yes - Recommend PT order Yes - Recommend PT order    Row Name 12/01/23 0125 12/01/23 0036 11/30/23 2100       Does the patient have exclusion criteria? No - Perform mobility assessment No - Perform mobility assessment No - Perform mobility assessment     What is the highest level of mobility based on the mobility assessment? Level 4 (Ambulates with assistance) - Balance while stepping forward/back - Complete Level 4 (Ambulates with assistance) - Balance while stepping forward/back - Complete Level 4 (Ambulates with assistance) - Balance while stepping forward/back - Complete     Is the above level different from baseline mobility prior to current illness? Yes - Recommend PT order Yes - Recommend PT order Yes - Recommend PT order        Diet: Diet Orders (From  admission, onward)     Start     Ordered   11/29/23 1824  Diet heart healthy/carb modified Room service appropriate? Yes; Fluid consistency: Thin  Diet effective now       Question Answer Comment  Diet-HS Snack? Nothing   Room service appropriate? Yes   Fluid consistency: Thin      11/29/23 1824            Barriers to discharge: none Disposition Plan:  Home  HH orders placed:  Status is: Obs  Objective: Blood pressure (!) 109/57, pulse (!) 105, temperature 97.9 F (36.6 C), temperature source Oral, resp. rate 20, height 5' 7 (1.702 m), weight 68 kg, SpO2 98%.  Examination:  Physical Exam Constitutional:      Appearance: Normal appearance.  HENT:     Head: Normocephalic and atraumatic.     Mouth/Throat:     Mouth: Mucous membranes are moist.  Eyes:     Extraocular Movements: Extraocular movements intact.  Cardiovascular:     Rate and Rhythm: Normal rate and regular rhythm.  Pulmonary:     Effort: Pulmonary effort is normal. No respiratory distress.     Breath sounds: Normal breath sounds. No wheezing.  Abdominal:     General: Bowel sounds are normal. There  is no distension.     Palpations: Abdomen is soft.     Tenderness: There is no abdominal tenderness.  Musculoskeletal:        General: Normal range of motion.     Cervical back: Normal range of motion and neck supple.  Skin:    General: Skin is warm and dry.  Neurological:     General: No focal deficit present.     Mental Status: She is alert.  Psychiatric:        Mood and Affect: Mood normal.      Consultants:    Procedures:    Data Reviewed: Results for orders placed or performed during the hospital encounter of 11/29/23 (from the past 24 hours)  Glucose, capillary     Status: Abnormal   Collection Time: 12/02/23  4:40 PM  Result Value Ref Range   Glucose-Capillary 231 (H) 70 - 99 mg/dL  Glucose, capillary     Status: Abnormal   Collection Time: 12/02/23  8:13 PM  Result Value Ref Range    Glucose-Capillary 259 (H) 70 - 99 mg/dL   Comment 1 Notify RN    Comment 2 Document in Chart   Glucose, capillary     Status: Abnormal   Collection Time: 12/03/23  8:29 AM  Result Value Ref Range   Glucose-Capillary 225 (H) 70 - 99 mg/dL  Glucose, capillary     Status: Abnormal   Collection Time: 12/03/23 12:02 PM  Result Value Ref Range   Glucose-Capillary 384 (H) 70 - 99 mg/dL  Glucose, capillary     Status: Abnormal   Collection Time: 12/03/23  1:06 PM  Result Value Ref Range   Glucose-Capillary 412 (H) 70 - 99 mg/dL    I have reviewed pertinent nursing notes, vitals, labs, and images as necessary. I have ordered labwork to follow up on as indicated.  I have reviewed the last notes from staff over past 24 hours. I have discussed patient's care plan and test results with nursing staff, CM/SW, and other staff as appropriate.  Old records reviewed in assessment of this patient  Time spent: Greater than 50% of the 55 minute visit was spent in counseling/coordination of care for the patient as laid out in the A&P.   LOS: 0 days   Alm Apo, MD [facility] 12/03/2023, 2:28 PM

## 2023-12-03 NOTE — Plan of Care (Signed)

## 2023-12-03 NOTE — Hospital Course (Signed)
 Allison Hughes is a 73 year old female with past medical history significant for hypertension, hyperlipidemia, CVA, COPD, diabetes mellitus type 2, history of GI bleed and obesity is readmitted with fall and altered mental status possibly with underlying cognitive impairment.   Of note patient has been admitted 6 times, every month over the past 6 months, with similar complaints.  Each time patient is declining to go to SNF.   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

## 2023-12-03 NOTE — Progress Notes (Addendum)
 Occupational Therapy Treatment Patient Details Name: Allison Hughes MRN: [id number] DOB: 1950-02-19 Today's Date: 12/03/2023   History of present illness 73 y/o F presenting to ED on 11/15 after being found down at home. 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    Recent hospitalization 10/6-10/28 after being found down, encephalopathic in setting of UTI with respiratory failure 2/2 hypoxia and COVID 19, went to [facility]    PMH includes HTN, HLD, CVA, COPD, DM2, GIB, neuroendocrine ulcer   OT comments  Pt received in bed with spaghetti splayed about her gown with pt not recognizing this. Remains with flat affect, but participatory. Pt confused and disoriented, asking OT why am I signing this? While OT was preparing room for therapy. She demonstrates profound cognitive deficits which is highly concerning given pt is alone for several hours in the evening/overnight time at home (outside of friend's - [place] - schedule per prior notes). Pt required significant multimodal cognitive cueing throughout session today. Up to mod A physical assist for mobility and mod-max A for simple grooming and UB dressing tasks. Demo's poor initiation, sequencing, reasoning, and termination. Returned to bed, alarm on, needs in reach. Strongly recommend against pt returning home as she demonstrates poor ability to reason and comprehend/retain information. Will continue to follow.      If plan is discharge home, recommend the following:  A lot of help with walking and/or transfers;A lot of help with bathing/dressing/bathroom;Assistance with cooking/housework;Assistance with feeding;Direct supervision/assist for medications management;Direct supervision/assist for financial management;Assist for transportation;Help with stairs or ramp for entrance;Supervision due to cognitive status   Equipment Recommendations  Other (comment) (defer)     Recommendations for Other Services      Precautions / Restrictions Precautions Precautions: Fall Recall of Precautions/Restrictions: Impaired Precaution/Restrictions Comments: AMS; delirium prevention precautions Restrictions Weight Bearing Restrictions Per Provider Order: No       Mobility Bed Mobility Overal bed mobility: Needs Assistance Bed Mobility: Supine to Sit, Sit to Supine     Supine to sit: Min assist, HOB elevated, Used rails Sit to supine: Mod assist, HOB elevated, Used rails   General bed mobility comments: assist for LE mgmt and extensive multimodal cueing for transitioning to EOB, pt with poor comprehension and sequencing    Transfers Overall transfer level: Needs assistance Equipment used: Rolling walker (2 wheels) Transfers: Sit to/from Stand Sit to Stand: Min assist, Mod assist           General transfer comment: stood from bed with cues for hand placement/powering up to RW.     Balance Overall balance assessment: Needs assistance Sitting-balance support: No upper extremity supported, Feet supported Sitting balance-Leahy Scale: Good Sitting balance - Comments: seated EOB, occasional CGA for safety   Standing balance support: Bilateral upper extremity supported, Reliant on assistive device for balance, During functional activity Standing balance-Leahy Scale: Poor Standing balance comment: reliant on RW to sink; pt poorly using RW when returning to bed from sink, using R hand to hold onto sink vanity and L hand on RW, stooped posture, needs up to mod A to prevent falling/LOB and to ensure safe approach to bed.                           ADL either performed or assessed with clinical judgement   ADL Overall ADL's : Needs assistance/impaired     Grooming: Moderate assistance;Cueing for sequencing;Cueing for  safety;Standing;Wash/dry hands Grooming Details (indicate cue type and reason): cues for initiation/termination, sequencing task,  locating appropriate grooming supplies, stability assist standing at the sink         Upper Body Dressing : Moderate assistance;Cueing for safety;Cueing for sequencing;Sitting Upper Body Dressing Details (indicate cue type and reason): gown exchange, cues for threading UEs through clean gown         Toileting- Clothing Manipulation and Hygiene: Maximal assistance Toileting - Clothing Manipulation Details (indicate cue type and reason): purewick intact     Functional mobility during ADLs: Minimal assistance;Moderate assistance;Rolling walker (2 wheels)      Extremity/Trunk Assessment Upper Extremity Assessment Upper Extremity Assessment: Generalized weakness   Lower Extremity Assessment Lower Extremity Assessment: Defer to PT evaluation        Vision       Perception     Praxis     Communication Communication Communication: Impaired Factors Affecting Communication: Hearing impaired   Cognition Arousal: Alert Behavior During Therapy: Impulsive, Lability Cognition: Cognition impaired   Orientation impairments: Situation, Place, Time Awareness: Intellectual awareness impaired Memory impairment (select all impairments): Short-term memory, Working civil service fast streamer, Non-declarative long-term memory, Geneticist, Molecular long-term memory Attention impairment (select first level of impairment): Focused attention, Sustained attention Executive functioning impairment (select all impairments): Initiation, Organization, Sequencing, Reasoning, Problem solving OT - Cognition Comments: flat affect, needs repetition of cues for task initiation, execution, termination, & attention; poor sequencing during ADLs                 Following commands: Impaired Following commands impaired: Follows one step commands inconsistently, Follows one step commands with increased time      Cueing   Cueing Techniques: Verbal cues, Gestural cues, Visual cues, Tactile cues  Exercises      Shoulder  Instructions       General Comments no family present, pt overall participatory; emotionally labile and very confused (worsening as session progressed)    Pertinent Vitals/ Pain       Pain Assessment Pain Assessment: Faces Faces Pain Scale: No hurt  Home Living                                          Prior Functioning/Environment              Frequency  Min 2X/week        Progress Toward Goals  OT Goals(current goals can now be found in the care plan section)  Progress towards OT goals: Progressing toward goals     Plan      Co-evaluation                 AM-PAC OT 6 Clicks Daily Activity     Outcome Measure   Help from another person eating meals?: A Little Help from another person taking care of personal grooming?: A Lot Help from another person toileting, which includes using toliet, bedpan, or urinal?: A Lot Help from another person bathing (including washing, rinsing, drying)?: A Lot Help from another person to put on and taking off regular upper body clothing?: A Lot Help from another person to put on and taking off regular lower body clothing?: A Lot 6 Click Score: 13    End of Session Equipment Utilized During Treatment: Rolling walker (2 wheels)  OT Visit Diagnosis: Unsteadiness on feet (R26.81);Muscle weakness (generalized) (M62.81)   Activity Tolerance Patient tolerated treatment well  Patient Left in bed;with call bell/phone within reach;with bed alarm set   Nurse Communication          Time: 1440-1457 OT Time Calculation (min): 17 min  Charges: OT General Charges $OT Visit: 1 Visit OT Treatments $Self Care/Home Management : 8-22 mins  Trace Wirick M. [place], OTR/L [facility] [phone] Secure Chat Preferred  Arel Tippen 12/03/2023, 4:23 PM

## 2023-12-03 NOTE — Progress Notes (Signed)
 Physical Therapy Treatment Patient Details Name: Allison Hughes MRN: [id number] DOB: March 16, 1950 Today's Date: 12/03/2023   History of Present Illness 73 y/o F presenting to ED on 11/15 after being found down at home. 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    Recent hospitalization 10/6-10/28 after being found down, encephalopathic in setting of UTI with respiratory failure 2/2 hypoxia and COVID 19, went to [facility]    PMH includes HTN, HLD, CVA, COPD, DM2, GIB, neuroendocrine ulcer    PT Comments  Pt pleasantly confused on entry. [name] is present. PT discussed with friend, pt's need for 24 hour care and he reports he is unable to provide and pt and he can not afford. Pt agreeable to get up to walk with therapist. As pt comes to EoB, pt found to have had BM in bed and pt unaware. Pt requiring contact guard for bed mobility, min-modA for transfers and min A for ambulation in hallway with RW. Given pt cognitive decline pt would be best served with either 24 hour care in her own home or a Memory Care unit at a SNF. PT will continue to work with pt on mobility, but even if mobility improves pt situation of needing 24 hour care will likely not change.    If plan is discharge home, recommend the following: A little help with walking and/or transfers;A lot of help with bathing/dressing/bathroom;Assistance with cooking/housework;Direct supervision/assist for medications management;Direct supervision/assist for financial management;Assist for transportation;Help with stairs or ramp for entrance;Supervision due to cognitive status   Can travel by private vehicle     Yes  Equipment Recommendations  Other (comment);BSC/3in1 (transport chair)       Precautions / Restrictions Precautions Precautions: Fall Recall of Precautions/Restrictions: Impaired Precaution/Restrictions Comments: AMS Restrictions Weight Bearing  Restrictions Per Provider Order: No     Mobility  Bed Mobility Overal bed mobility: Needs Assistance Bed Mobility: Supine to Sit     Supine to sit: Contact guard, HOB elevated, Used rails Sit to supine: Contact guard assist   General bed mobility comments: pt contact guard for coming to EoB with HoB elevated and use of bedrail, pt noted to have had BM in bed, pt unaware. Pt returned to bed at end of session, requires increased cuing to return to supine, and contact guard to keep LE in bed until she was able to bridge her hips towards center of the bed    Transfers Overall transfer level: Needs assistance Equipment used: Rolling walker (2 wheels) Transfers: Sit to/from Stand Sit to Stand: Min assist, Mod assist   Step pivot transfers: Min assist       General transfer comment: pt requires min A for coming to standing from bed to RW, needs increased cuing to wait until RW in front of her before she stands up, pt is mod A for coming to standing with face to face transfer so that RN could perform pericare, minA for steadying with pivot to RW in doorway of bathroom    Ambulation/Gait Ambulation/Gait assistance: Contact guard assist Gait Distance (Feet): 60 Feet Assistive device: Rolling walker (2 wheels) Gait Pattern/deviations: Step-to pattern, Knee flexed in stance - right, Knee flexed in stance - left Gait velocity: reduced Gait velocity interpretation: <1.31 ft/sec, indicative of household ambulator   General Gait Details: slowed mildly unsteady gait, increased flexed forward posture and knee flexion with distance, talking to NT in hallway, needs reminder to watch where  she is going, no overt LoB         Balance Overall balance assessment: Needs assistance Sitting-balance support: No upper extremity supported, Feet supported Sitting balance-Leahy Scale: Good     Standing balance support: Bilateral upper extremity supported, Reliant on assistive device for  balance Standing balance-Leahy Scale: Poor Standing balance comment: needs B UE support on RW or therapist                            Communication Communication Communication: Impaired Factors Affecting Communication: Hearing impaired  Cognition Arousal: Alert Behavior During Therapy: Impulsive   PT - Cognitive impairments: History of cognitive impairments, Memory, Awareness, Safety/Judgement   Orientation impairments: Place, Time, Situation                   PT - Cognition Comments: Oriented to name and DOB, not time, situation, or place. Difficulty seqencing mobility tasks, and often required questions be repeated. Following commands: Intact Following commands impaired: Follows one step commands inconsistently, Follows multi-step commands inconsistently    Cueing Cueing Techniques: Verbal cues, Gestural cues, Visual cues     General Comments General comments (skin integrity, edema, etc.): [name] friend Manus is in the room, confirms that he is only able to be there from 8am to 6pm due to driving restrictions, he reports that he routinely comes in the morning and is unaware of having BM in bed. He cleans her up. When she falls which she does regularly he calls the [employer] to help and get her up      Pertinent Vitals/Pain Pain Assessment Pain Assessment: No/denies pain     PT Goals (current goals can now be found in the care plan section) Acute Rehab PT Goals Patient Stated Goal: to go home PT Goal Formulation: With patient Time For Goal Achievement: 12/14/23 Potential to Achieve Goals: Fair Progress towards PT goals: Progressing toward goals    Frequency    Min 2X/week       AM-PAC PT 6 Clicks Mobility   Outcome Measure  Help needed turning from your back to your side while in a flat bed without using bedrails?: A Little Help needed moving from lying on your back to sitting on the side of a flat bed without using bedrails?: A  Little Help needed moving to and from a bed to a chair (including a wheelchair)?: A Little Help needed standing up from a chair using your arms (e.g., wheelchair or bedside chair)?: A Little Help needed to walk in hospital room?: A Little Help needed climbing 3-5 steps with a railing? : Total 6 Click Score: 16    End of Session Equipment Utilized During Treatment: Gait belt Activity Tolerance: Patient tolerated treatment well Patient left: with call bell/phone within reach;in bed;with bed alarm set;with nursing/sitter in room;with family/visitor present Nurse Communication: Mobility status PT Visit Diagnosis: Muscle weakness (generalized) (M62.81);Other abnormalities of gait and mobility (R26.89)     Time: 8978-8944 PT Time Calculation (min) (ACUTE ONLY): 34 min  Charges:    $Gait Training: 8-22 mins $Therapeutic Activity: 8-22 mins PT General Charges $$ ACUTE PT VISIT: 1 Visit                     Justine Dines B. Fleeta Lapidus PT, DPT Acute Rehabilitation Services Please use secure chat or  Call Office [phone]    Allison Hughes 12/03/2023, 1:45 PM

## 2023-12-04 DIAGNOSIS — G9340 Encephalopathy, unspecified: Secondary | ICD-10-CM | POA: Diagnosis not present

## 2023-12-04 DIAGNOSIS — W19XXXA Unspecified fall, initial encounter: Secondary | ICD-10-CM | POA: Diagnosis not present

## 2023-12-04 DIAGNOSIS — Y92009 Unspecified place in unspecified non-institutional (private) residence as the place of occurrence of the external cause: Secondary | ICD-10-CM | POA: Diagnosis not present

## 2023-12-04 LAB — GLUCOSE, CAPILLARY: Glucose-Capillary: 250 mg/dL — ABNORMAL HIGH (ref 70–99)

## 2023-12-04 MED ORDER — ASPIRIN 81 MG PO TBEC: 81.0000 mg | DELAYED_RELEASE_TABLET | Freq: Every day | ORAL | Status: AC

## 2023-12-04 MED ORDER — QUETIAPINE FUMARATE 50 MG PO TABS: 50.0000 mg | ORAL_TABLET | Freq: Two times a day (BID) | ORAL | Status: DC

## 2023-12-04 NOTE — Discharge Summary (Signed)
 Physician Discharge Summary   Allison Hughes FMW:[id number] DOB: April 18, 1950 DOA: 11/29/2023  PCP: Valma Carwin, MD  Admit date: 11/29/2023 Discharge date: 12/04/2023  Admitted From: Home Disposition:  Home Discharging physician: Alm Apo, MD Barriers to discharge: none  Recommendations at discharge: Eventually will need HCPOA designated; currently brother by default but Allison Hughes very involved May need to be placed in a memory care facility soon   Discharge Condition: stable CODE STATUS: Full  Diet recommendation:  Diet Orders (From admission, onward)     Start     Ordered   12/04/23 0000  Diet Carb Modified        12/04/23 1058   11/29/23 1824  Diet heart healthy/carb modified Room service appropriate? Yes; Fluid consistency: Thin  Diet effective now       Question Answer Comment  Diet-HS Snack? Nothing   Room service appropriate? Yes   Fluid consistency: Thin      11/29/23 1824            Hospital Course: Allison Hughes is a 73 year old female with past medical history significant for hypertension, hyperlipidemia, CVA, COPD, diabetes mellitus type 2, history of GI bleed and obesity is readmitted with fall and altered mental status possibly with underlying cognitive impairment.   Of note patient has been admitted 6 times, every month over the past 6 months, with similar complaints.  Each time patient is declining to go to SNF.   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 On day of discharge, patient was felt deemed stable for discharge. Patient/family member advised to call PCP or come back to ER if needed.   Principal Diagnosis: Fall at home, initial encounter  Discharge Diagnoses: Active Hospital Problems   Diagnosis Date Noted   Fall at home, initial encounter 10/20/2023    Priority: 1.   Acute encephalopathy 11/29/2023    Priority: 2.   Neurocognitive disorder 08/31/2023    Priority: 2.   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    Priority: 3.   Essential hypertension 02/02/2016    Priority: 4.   Chronic diastolic CHF (congestive heart failure) (HCC) 10/20/2023    Priority: 5.   DM (diabetes mellitus), type 2 (HCC) 11/20/2018    Priority: 6.   Hyperlipidemia 10/20/2023  Priority: 7.   History of stroke 09/06/2020    Priority: 8.   GERD (gastroesophageal reflux disease) 11/29/2023    Priority: 10.   History of gastric ulcer 10/20/2023    Priority: 10.    Resolved Hospital Problems  No resolved problems to display.     Discharge Instructions     Diet Carb Modified   Complete by: As directed    Increase activity slowly   Complete  by: As directed    No wound care   Complete by: As directed       Allergies as of 12/04/2023       Reactions   Iodine Swelling   Shrimp [shellfish Allergy] Swelling   Fresh shrimp        Medication List     STOP taking these medications    cyanocobalamin  500 MCG tablet Commonly known as: VITAMIN B12   Ensure Max Protein Liqd   potassium chloride  SA 20 MEQ tablet Commonly known as: KLOR-CON  M   Slow-Mag 71.5-119 MG Tbec SR tablet Generic drug: magnesium  chloride   traZODone 50 MG tablet Commonly known as: DESYREL       TAKE these medications    acetaminophen  500 MG tablet Commonly known as: TYLENOL  Take 2 tablets (1,000 mg total) by mouth every 8 (eight) hours as needed for mild pain (pain score 1-3) or headache.   albuterol  108 (90 Base) MCG/ACT inhaler Commonly known as: VENTOLIN  HFA Inhale 2 puffs into the lungs every 6 (six) hours as needed for wheezing or shortness of breath.   ALPRAZolam  0.25 MG tablet Commonly known as: XANAX  Take 1 tablet (0.25 mg total) by mouth 2 (two) times daily as needed for anxiety. What changed: when to take this   aspirin  EC 81 MG tablet Take 1 tablet (81 mg total) by mouth daily. Swallow whole. Start taking on: December 05, 2023   atorvastatin  40 MG tablet Commonly known as: LIPITOR Take 40 mg by mouth daily.   lisinopril  30 MG tablet Commonly known as: ZESTRIL  Take 1 tablet (30 mg total) by mouth at bedtime.   metFORMIN  500 MG tablet Commonly known as: GLUCOPHAGE  Take 500 mg by mouth at bedtime. What changed: Another medication with the same name was removed. Continue taking this medication, and follow the directions you see here.   pantoprazole  40 MG tablet Commonly known as: PROTONIX  Take 1 tablet (40 mg total) by mouth 2 (two) times daily for 30 days, THEN 1 tablet (40 mg total) daily. Start taking on: September 23, 2023 What changed: See the new instructions.   QUEtiapine  50 MG tablet Commonly known as:  SEROQUEL  Take 1 tablet (50 mg total) by mouth 2 (two) times daily. What changed:  when to take this Another medication with the same name was removed. Continue taking this medication, and follow the directions you see here.   Trelegy Ellipta 100-62.5-25 MCG/ACT Aepb Generic drug: Fluticasone -Umeclidin-Vilant Inhale 1 puff into the lungs daily at 12 noon.   Tresiba  100 UNIT/ML Soln Generic drug: Insulin  Degludec Inject 15 Units into the skin at bedtime.               Durable Medical Equipment  (From admission, onward)           Start     Ordered   12/02/23 1401  For home use only DME Bedside commode  Once       Comments: Patient needs 3:1 provided since patient is unable to mobilize to bathroom facilities at  the home.  Question:  Patient needs a bedside commode to treat with the following condition  Answer:  Fall   12/02/23 1401            Follow-up Information     Care, [facility] Follow up.   Specialty: Home Health Services Why: [facility] will provide home health services.  They will call you in the next 24-48 hours to set up services. Contact information: [address] [place] [address] [phone]         Care, [facility] Follow up.   Why: [employer] will deliver a bedside commode to the bedside. Contact information: [address] [place] [address] [phone]                Allergies  Allergen Reactions   Iodine Swelling   Shrimp [Shellfish Allergy] Swelling    Fresh shrimp    Consultations:   Procedures:   Discharge Exam: BP 114/68 (BP Location: Left Arm)   Pulse 93   Temp 98.6 F (37 C)   Resp 17   Ht 5' 7 (1.702 m)   Wt 68 kg   SpO2 94%   BMI 23.48 kg/m  Physical Exam Constitutional:      Appearance: Normal appearance.  HENT:     Head: Normocephalic and atraumatic.     Mouth/Throat:     Mouth: Mucous membranes are moist.  Eyes:     Extraocular Movements: Extraocular  movements intact.  Cardiovascular:     Rate and Rhythm: Normal rate and regular rhythm.  Pulmonary:     Effort: Pulmonary effort is normal. No respiratory distress.     Breath sounds: Normal breath sounds. No wheezing.  Abdominal:     General: Bowel sounds are normal. There is no distension.     Palpations: Abdomen is soft.     Tenderness: There is no abdominal tenderness.  Musculoskeletal:        General: Normal range of motion.     Cervical back: Normal range of motion and neck supple.  Skin:    General: Skin is warm and dry.  Neurological:     General: No focal deficit present.     Mental Status: She is alert.  Psychiatric:        Mood and Affect: Mood normal.      The results of significant diagnostics from this hospitalization (including imaging, microbiology, ancillary and laboratory) are listed below for reference.   Microbiology: Recent Results (from the past 240 hours)  Resp panel by RT-PCR (RSV, Flu A&B, Covid) Anterior Nasal Swab     Status: None   Collection Time: 11/29/23  3:56 PM   Specimen: Anterior Nasal Swab  Result Value Ref Range Status   SARS Coronavirus 2 by RT PCR NEGATIVE NEGATIVE Final   Influenza A by PCR NEGATIVE NEGATIVE Final   Influenza B by PCR NEGATIVE NEGATIVE Final    Comment: (NOTE) The Xpert Xpress SARS-CoV-2/FLU/RSV plus assay is intended as an aid in the diagnosis of influenza from Nasopharyngeal swab specimens and should not be used as a sole basis for treatment. Nasal washings and aspirates are unacceptable for Xpert Xpress SARS-CoV-2/FLU/RSV testing.  Fact Sheet for Patients: bloggercourse.com  Fact Sheet for Healthcare Providers: seriousbroker.it  This test is not yet approved or cleared by the [place]  FDA and has been authorized for detection and/or diagnosis of SARS-CoV-2 by FDA under an Emergency Use Authorization (EUA). This EUA will remain in effect (  meaning this test  can be used) for the duration of the COVID-19 declaration under Section 564(b)(1) of the Act, 21 U.S.C. section 360bbb-3(b)(1), unless the authorization is terminated or revoked.     Resp Syncytial Virus by PCR NEGATIVE NEGATIVE Final    Comment: (NOTE) Fact Sheet for Patients: bloggercourse.com  Fact Sheet for Healthcare Providers: seriousbroker.it  This test is not yet approved or cleared by the [place]  FDA and has been authorized for detection and/or diagnosis of SARS-CoV-2 by FDA under an Emergency Use Authorization (EUA). This EUA will remain in effect (meaning this test can be used) for the duration of the COVID-19 declaration under Section 564(b)(1) of the Act, 21 U.S.C. section 360bbb-3(b)(1), unless the authorization is terminated or revoked.  Performed at [facility], [address], [place], [place] [address]      Labs: BNP (last 3 results) Recent Labs    09/19/23 1943 10/20/23 1625  BNP 78.7 144.1*   Basic Metabolic Panel: Recent Labs  Lab 11/29/23 1412 11/29/23 1416 11/29/23 1756 11/30/23 0556 12/01/23 0346  NA 140 140 141 139 141  K 3.8 3.9 3.9 3.6 3.7  CL 104 105  --  105 104  CO2 25  --   --  23 26  GLUCOSE 186* 182*  --  144* 234*  BUN 9 10  --  6* 15  CREATININE 0.60 0.70  --  0.49 0.61  CALCIUM  9.3  --   --  8.9 9.0  MG  --   --   --  1.6* 1.9  PHOS  --   --   --   --  3.7   Liver Function Tests: Recent Labs  Lab 11/29/23 1412 12/01/23 0346  AST 26  --   ALT 33  --   ALKPHOS 145*  --   BILITOT 0.6  --   PROT 6.2*  --   ALBUMIN 2.9* 2.6*   No results for input(s): LIPASE, AMYLASE in the last 168 hours. Recent Labs  Lab 11/29/23 1845  AMMONIA <13   CBC: Recent Labs  Lab 11/29/23 1412 11/29/23 1416 11/29/23 1756 11/30/23 0556  WBC 7.3  --   --  5.5  HGB 12.3 12.9 12.2 12.3  HCT 40.1 38.0 36.0 39.6  MCV 76.8*  --   --  77.2*  PLT 276  --   --  239    Cardiac Enzymes: Recent Labs  Lab 11/29/23 1412  CKTOTAL 38  CKMB 1.2   BNP: Invalid input(s): POCBNP CBG: Recent Labs  Lab 12/03/23 1500 12/03/23 1721 12/03/23 1729 12/03/23 2103 12/04/23 0845  GLUCAP 246* 102* 109* 196* 250*   D-Dimer No results for input(s): DDIMER in the last 72 hours. Hgb A1c No results for input(s): HGBA1C in the last 72 hours. Lipid Profile No results for input(s): CHOL, HDL, LDLCALC, TRIG, CHOLHDL, LDLDIRECT in the last 72 hours. Thyroid function studies No results for input(s): TSH, T4TOTAL, T3FREE, THYROIDAB in the last 72 hours.  Invalid input(s): FREET3 Anemia work up No results for input(s): VITAMINB12, FOLATE, FERRITIN, TIBC, IRON , RETICCTPCT in the last 72 hours. Urinalysis    Component Value Date/Time   COLORURINE YELLOW 12/03/2023 1800   APPEARANCEUR CLEAR 12/03/2023 1800   LABSPEC 1.015 12/03/2023 1800   PHURINE 7.0 12/03/2023 1800   GLUCOSEU >=500 (A) 12/03/2023 1800   HGBUR NEGATIVE 12/03/2023 1800   BILIRUBINUR NEGATIVE 12/03/2023 1800   KETONESUR NEGATIVE 12/03/2023 1800   PROTEINUR NEGATIVE 12/03/2023 1800   NITRITE  NEGATIVE 12/03/2023 1800   LEUKOCYTESUR NEGATIVE 12/03/2023 1800   Sepsis Labs Recent Labs  Lab 11/29/23 1412 11/30/23 0556  WBC 7.3 5.5   Microbiology Recent Results (from the past 240 hours)  Resp panel by RT-PCR (RSV, Flu A&B, Covid) Anterior Nasal Swab     Status: None   Collection Time: 11/29/23  3:56 PM   Specimen: Anterior Nasal Swab  Result Value Ref Range Status   SARS Coronavirus 2 by RT PCR NEGATIVE NEGATIVE Final   Influenza A by PCR NEGATIVE NEGATIVE Final   Influenza B by PCR NEGATIVE NEGATIVE Final    Comment: (NOTE) The Xpert Xpress SARS-CoV-2/FLU/RSV plus assay is intended as an aid in the diagnosis of influenza from Nasopharyngeal swab specimens and should not be used as a sole basis for treatment. Nasal washings and aspirates are  unacceptable for Xpert Xpress SARS-CoV-2/FLU/RSV testing.  Fact Sheet for Patients: bloggercourse.com  Fact Sheet for Healthcare Providers: seriousbroker.it  This test is not yet approved or cleared by the [place]  FDA and has been authorized for detection and/or diagnosis of SARS-CoV-2 by FDA under an Emergency Use Authorization (EUA). This EUA will remain in effect (meaning this test can be used) for the duration of the COVID-19 declaration under Section 564(b)(1) of the Act, 21 U.S.C. section 360bbb-3(b)(1), unless the authorization is terminated or revoked.     Resp Syncytial Virus by PCR NEGATIVE NEGATIVE Final    Comment: (NOTE) Fact Sheet for Patients: bloggercourse.com  Fact Sheet for Healthcare Providers: seriousbroker.it  This test is not yet approved or cleared by the [place]  FDA and has been authorized for detection and/or diagnosis of SARS-CoV-2 by FDA under an Emergency Use Authorization (EUA). This EUA will remain in effect (meaning this test can be used) for the duration of the COVID-19 declaration under Section 564(b)(1) of the Act, 21 U.S.C. section 360bbb-3(b)(1), unless the authorization is terminated or revoked.  Performed at [facility], [address] [place], [place] [address]     Procedures/Studies: CT CHEST ABDOMEN PELVIS W CONTRAST Result Date: 11/29/2023 EXAM: CT CHEST, ABDOMEN AND PELVIS WITH CONTRAST 11/29/2023 02:43:03 PM TECHNIQUE: CT of the chest, abdomen and pelvis was performed with the administration of intravenous contrast. 75 mL of iohexol  (OMNIPAQUE ) 350 MG/ML injection was administered. Multiplanar reformatted images are provided for review. Automated exposure control, iterative reconstruction, and/or weight based adjustment of the mA/kV was utilized to reduce the radiation dose to as low as reasonably achievable.  COMPARISON: 09/19/2023 CLINICAL HISTORY: Polytrauma, blunt FINDINGS: CHEST: MEDIASTINUM AND LYMPH NODES: Scattered plaque through the thoracic aorta with some eccentric nonocclusive mural thrombus in the mid and distal descending segment, possible embolic risk. Heart and pericardium are unremarkable. Scattered coronary calcifications. The central airways are clear. No mediastinal, hilar or axillary lymphadenopathy. LUNGS AND PLEURA: Pulmonary ground glass opacities most conspicuous posteriorly in the lung bases but also seen in the upper lobes, left worse than right, showing some decrease since previous study. Septal thickening has significantly improved. No pleural effusion or pneumothorax. ABDOMEN AND PELVIS: LIVER: The liver is unremarkable. GALLBLADDER AND BILE DUCTS: Innumerable subcentimeter partially calcified gallstones layer in the dependent aspect of the nondilated gallbladder. No biliary ductal dilatation. SPLEEN: No acute abnormality. PANCREAS: No acute abnormality. ADRENAL GLANDS: No acute abnormality. KIDNEYS, URETERS AND BLADDER: No stones in the kidneys or ureters. No hydronephrosis. No perinephric or periureteral stranding. Urinary bladder is unremarkable. GI AND BOWEL: Stomach demonstrates no acute abnormality. There is no bowel obstruction. REPRODUCTIVE ORGANS:  No acute abnormality. PERITONEUM AND RETROPERITONEUM: No ascites. No free air. VASCULATURE: Abdominal aorta is normal in caliber. ABDOMINAL AND PELVIS LYMPH NODES: No lymphadenopathy. 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 Bilateral pelvic phleboliths. No focal soft tissue abnormality. IMPRESSION: 1. No acute findings. 2. Ground glass opacities in the lung bases and upper lobes, left worse than right, decreased from prior, with improved septal thickening. 3. Eccentric nonocclusive mural thrombus within the mid to distal descending thoracic aorta, potential  embolic source; consider vascular consultation re antithrombotic management. Electronically signed by: Katheleen Faes MD 11/29/2023 02:54 PM EST RP Workstation: [id number]   CT CERVICAL SPINE WO CONTRAST Result Date: 11/29/2023 CLINICAL DATA:  Blunt trauma EXAM: CT CERVICAL SPINE WITHOUT CONTRAST TECHNIQUE: Multidetector CT imaging of the cervical spine was performed without intravenous contrast. Multiplanar CT image reconstructions were also generated. RADIATION DOSE REDUCTION: This exam was performed according to the departmental dose-optimization program which includes automated exposure control, adjustment of the mA and/or kV according to patient size and/or use of iterative reconstruction technique. COMPARISON:  10/20/2023 FINDINGS: Alignment: Normal. Skull base and vertebrae: No acute fracture. No primary bone lesion or focal pathologic process. Soft tissues and spinal canal: No prevertebral fluid or swelling. No visible canal hematoma. Disc levels: Moderate spondylosis at C4-5 and C5-6, with stable mild central canal stenosis and bilateral right greater than left neural foraminal encroachment at those levels. Mild C6-7 spondylosis. Upper chest: Airway is patent. Emphysematous changes are seen at the lung apices. Other: Reconstructed images demonstrate no additional findings. IMPRESSION: 1. No acute cervical spine fracture. 2. Stable spondylosis and neural foraminal encroachment most pronounced at C4-5 and C5-6. 3. Pulmonary emphysema is present. Pulmonary emphysema is an independent risk factor for lung cancer. Recommend patient be considered for lung cancer screening. [place]  Chief Financial Officer. Screening for Lung Cancer: [place]  Licensed Conveyancer. JAMA. 2021;325(10):962-970. Electronically Signed   By: Ozell Daring M.D.   On: 11/29/2023 14:51   CT HEAD WO CONTRAST Result Date: 11/29/2023 CLINICAL DATA:  Moderate to severe head trauma, fell EXAM: CT HEAD  WITHOUT CONTRAST TECHNIQUE: Contiguous axial images were obtained from the base of the skull through the vertex without intravenous contrast. RADIATION DOSE REDUCTION: This exam was performed according to the departmental dose-optimization program which includes automated exposure control, adjustment of the mA and/or kV according to patient size and/or use of iterative reconstruction technique. COMPARISON:  10/20/2023 FINDINGS: Brain: Stable chronic ischemic changes throughout the periventricular and subcortical white matter as well as left cerebellar hemisphere. No evidence of acute infarct or hemorrhage. The lateral ventricles and midline structures are otherwise stable. No acute extra-axial fluid collections. No mass effect. Vascular: No hyperdense vessel or unexpected calcification. Skull: Normal. Negative for fracture or focal lesion. Sinuses/Orbits: No acute finding. Other: None. IMPRESSION: 1. No acute intracranial process. 2. Stable chronic ischemic changes as above. Electronically Signed   By: Ozell Daring M.D.   On: 11/29/2023 14:47   DG Chest Port 1 View Result Date: 11/29/2023 CLINICAL DATA:  Found on floor at home by friends. EXAM: PORTABLE CHEST 1 VIEW COMPARISON:  10/20/2023 FINDINGS: Loop recorder projects over the left heart. Lungs are somewhat hypoinflated without lobar consolidation or effusion. Stone silhouette and remainder of the exam is unchanged. IMPRESSION: Hypoinflation without acute cardiopulmonary disease. Electronically Signed   By: Toribio Agreste M.D.   On: 11/29/2023 14:10   DG Pelvis Portable Result Date: 11/29/2023  CLINICAL DATA:  Clemens, found down EXAM: PORTABLE PELVIS 1-2 VIEWS COMPARISON:  10/20/2023 FINDINGS: Supine frontal view of the pelvis and bilateral hips demonstrates no acute displaced fracture, subluxation, or dislocation. Joint spaces are well preserved. Sacroiliac joints are normal. IMPRESSION: 1. Unremarkable pelvis and bilateral hips. Electronically Signed    By: Ozell Daring M.D.   On: 11/29/2023 14:01     Time coordinating discharge: Over 30 minutes    Alm Apo, MD  [facility] 12/04/2023, 1:56 PM

## 2023-12-04 NOTE — TOC Transition Note (Signed)
 Transition of Care [facility]) - Discharge Note   Patient Details  Name: Allison Hughes MRN: [id number] Date of Birth: [birth year]  Transition of [facility]) CM/SW Contact:  [name], RN Phone Number: 12/04/2023, 11:15 AM   Clinical Narrative:    CM met with the patient and friend, [name] at the bedside to discuss safe transport to home.  The patient/friend have requested [facility] transport to home.  Phil plans to take the 3:1 - present in the home today.  Patient states that she spoke with social services by phone and that she is considering memory care placement in the future but no placement for now.  [facility] has accepted the patient for [facility] services for PT, MSW and they plan to follow up with the patient and will assist with services and follow up regarding waiting list to Memory Care.  [facility] was called for ambulance to home today.  Bedside nursing is aware that [facility] has been called.         Patient Goals and CMS Choice            Discharge Placement                       Discharge Plan and Services Additional resources added to the After Visit Summary for                                       Social Drivers of Health (SDOH) Interventions SDOH Screenings   Food Insecurity: Patient Unable To Answer (11/29/2023)  Housing: Patient Unable To Answer (11/29/2023)  Transportation Needs: Patient Unable To Answer (11/29/2023)  Utilities: Patient Unable To Answer (11/29/2023)  Depression (PHQ2-9): Low Risk  (10/26/2020)  Social Connections: Patient Unable To Answer (11/29/2023)  Tobacco Use: High Risk (11/29/2023)     Readmission Risk Interventions    10/24/2023    2:14 PM  Readmission Risk Prevention Plan  Transportation Screening Complete  PCP or Specialist Appt within 3-5 Days Complete  HRI or Home Care Consult Complete  Social Work Consult for Recovery Care Planning/Counseling Complete  Palliative Care Screening Complete  Medication  Review Oceanographer) Referral to Pharmacy

## 2023-12-04 NOTE — Plan of Care (Signed)
  Problem: Clinical Measurements: Goal: Ability to maintain clinical measurements within normal limits will improve Outcome: Progressing Goal: Will remain free from infection Outcome: Progressing Goal: Diagnostic test results will improve Outcome: Progressing Goal: Respiratory complications will improve Outcome: Progressing Goal: Cardiovascular complication will be avoided Outcome: Progressing   Problem: Clinical Measurements: Goal: Ability to maintain clinical measurements within normal limits will improve Outcome: Progressing Goal: Will remain free from infection Outcome: Progressing Goal: Diagnostic test results will improve Outcome: Progressing Goal: Respiratory complications will improve Outcome: Progressing Goal: Cardiovascular complication will be avoided Outcome: Progressing   

## 2023-12-04 NOTE — TOC Progression Note (Signed)
 Transition of Care [facility]) - Progression Note    Patient Details  Name: Allison Hughes MRN: [id number] Date of Birth: [birth year]  Transition of Care [facility]) CM/SW Contact  [name] Clack, [place] Phone Number: 12/04/2023, 11:53 AM  Clinical Narrative:     CSW left VM with APS worker Jori letting her know patient would be discharging home today with home health. CSW left call back number in case APS social worker has any questions. CSW will continue to follow as needs arise in the hospital.                     Expected Discharge Plan and Services         Expected Discharge Date: 12/04/23                                     Social Drivers of Health (SDOH) Interventions SDOH Screenings   Food Insecurity: Patient Unable To Answer (11/29/2023)  Housing: Patient Unable To Answer (11/29/2023)  Transportation Needs: Patient Unable To Answer (11/29/2023)  Utilities: Patient Unable To Answer (11/29/2023)  Depression (PHQ2-9): Low Risk  (10/26/2020)  Social Connections: Patient Unable To Answer (11/29/2023)  Tobacco Use: High Risk (11/29/2023)    Readmission Risk Interventions    10/24/2023    2:14 PM  Readmission Risk Prevention Plan  Transportation Screening Complete  PCP or Specialist Appt within 3-5 Days Complete  HRI or Home Care Consult Complete  Social Work Consult for Recovery Care Planning/Counseling Complete  Palliative Care Screening Complete  Medication Review Oceanographer) Referral to Pharmacy

## 2023-12-19 NOTE — TOC Progression Note (Signed)
 Transition of Care [facility]) - Progression Note    Patient Details  Name: Allison Hughes MRN: [id number] Date of Birth: 1950-10-08  Transition of Care [facility]) CM/SW Contact  [name] Clack, [place] Phone Number: 12/19/2023, 9:14 AM  Clinical Narrative:     CSW received phone call from Jon Keel, APS social worker, regarding patient updates. CSW informed APS SW patient had discharged home with home health services through [facility]. APS SW to follow up with [facility] about patient's wellbeing. APS will also follow up with [name] Cong, patient's friend and neighbor.   CSW accessed patient's chart to see what home health services patient had been set up with at the hospital and to relay information to APS SW.                     Expected Discharge Plan and Services         Expected Discharge Date: 12/04/23                                     Social Drivers of Health (SDOH) Interventions SDOH Screenings   Food Insecurity: Patient Unable To Answer (11/29/2023)  Housing: Patient Unable To Answer (11/29/2023)  Transportation Needs: Patient Unable To Answer (11/29/2023)  Utilities: Patient Unable To Answer (11/29/2023)  Depression (PHQ2-9): Low Risk  (10/26/2020)  Social Connections: Patient Unable To Answer (11/29/2023)  Tobacco Use: High Risk (11/29/2023)    Readmission Risk Interventions    10/24/2023    2:14 PM  Readmission Risk Prevention Plan  Transportation Screening Complete  PCP or Specialist Appt within 3-5 Days Complete  HRI or Home Care Consult Complete  Social Work Consult for Recovery Care Planning/Counseling Complete  Palliative Care Screening Complete  Medication Review Oceanographer) Referral to Pharmacy

## 2023-12-20 ENCOUNTER — Encounter

## 2023-12-22 ENCOUNTER — Encounter

## 2023-12-22 ENCOUNTER — Ambulatory Visit: Attending: Cardiology

## 2023-12-23 LAB — CUP PACEART REMOTE DEVICE CHECK
Date Time Interrogation Session: 20251207231336
Implantable Pulse Generator Implant Date: 20220826

## 2023-12-24 ENCOUNTER — Ambulatory Visit: Payer: Self-pay | Admitting: Cardiology

## 2023-12-28 ENCOUNTER — Emergency Department (HOSPITAL_COMMUNITY)

## 2023-12-28 ENCOUNTER — Other Ambulatory Visit: Payer: Self-pay

## 2023-12-28 ENCOUNTER — Inpatient Hospital Stay (HOSPITAL_COMMUNITY)
Admission: EM | Admit: 2023-12-28 | Discharge: 2024-01-01 | DRG: 689 | Disposition: A | Discharge status: Skilled Nursing Facility | Attending: Family Medicine | Admitting: Family Medicine

## 2023-12-28 DIAGNOSIS — Z7984 Long term (current) use of oral hypoglycemic drugs: Secondary | ICD-10-CM | POA: Diagnosis not present

## 2023-12-28 DIAGNOSIS — J9601 Acute respiratory failure with hypoxia: Secondary | ICD-10-CM | POA: Diagnosis not present

## 2023-12-28 DIAGNOSIS — R81 Glycosuria: Secondary | ICD-10-CM | POA: Diagnosis present

## 2023-12-28 DIAGNOSIS — N3001 Acute cystitis with hematuria: Principal | ICD-10-CM | POA: Diagnosis present

## 2023-12-28 DIAGNOSIS — E785 Hyperlipidemia, unspecified: Secondary | ICD-10-CM | POA: Diagnosis present

## 2023-12-28 DIAGNOSIS — E1169 Type 2 diabetes mellitus with other specified complication: Secondary | ICD-10-CM | POA: Diagnosis not present

## 2023-12-28 DIAGNOSIS — J449 Chronic obstructive pulmonary disease, unspecified: Secondary | ICD-10-CM | POA: Diagnosis not present

## 2023-12-28 DIAGNOSIS — Z1619 Resistance to other specified beta lactam antibiotics: Secondary | ICD-10-CM | POA: Diagnosis present

## 2023-12-28 DIAGNOSIS — Z8673 Personal history of transient ischemic attack (TIA), and cerebral infarction without residual deficits: Secondary | ICD-10-CM | POA: Diagnosis not present

## 2023-12-28 DIAGNOSIS — G9341 Metabolic encephalopathy: Secondary | ICD-10-CM | POA: Diagnosis present

## 2023-12-28 DIAGNOSIS — R410 Disorientation, unspecified: Secondary | ICD-10-CM

## 2023-12-28 DIAGNOSIS — K219 Gastro-esophageal reflux disease without esophagitis: Secondary | ICD-10-CM | POA: Diagnosis present

## 2023-12-28 DIAGNOSIS — I513 Intracardiac thrombosis, not elsewhere classified: Secondary | ICD-10-CM | POA: Diagnosis present

## 2023-12-28 DIAGNOSIS — I5032 Chronic diastolic (congestive) heart failure: Secondary | ICD-10-CM | POA: Diagnosis present

## 2023-12-28 DIAGNOSIS — Z833 Family history of diabetes mellitus: Secondary | ICD-10-CM

## 2023-12-28 DIAGNOSIS — R296 Repeated falls: Secondary | ICD-10-CM | POA: Diagnosis present

## 2023-12-28 DIAGNOSIS — R823 Hemoglobinuria: Secondary | ICD-10-CM | POA: Diagnosis present

## 2023-12-28 DIAGNOSIS — Z7982 Long term (current) use of aspirin: Secondary | ICD-10-CM

## 2023-12-28 DIAGNOSIS — Z91013 Allergy to seafood: Secondary | ICD-10-CM

## 2023-12-28 DIAGNOSIS — B9689 Other specified bacterial agents as the cause of diseases classified elsewhere: Secondary | ICD-10-CM | POA: Diagnosis present

## 2023-12-28 DIAGNOSIS — F17210 Nicotine dependence, cigarettes, uncomplicated: Secondary | ICD-10-CM | POA: Diagnosis present

## 2023-12-28 DIAGNOSIS — F0394 Unspecified dementia, unspecified severity, with anxiety: Secondary | ICD-10-CM | POA: Diagnosis present

## 2023-12-28 DIAGNOSIS — R531 Weakness: Secondary | ICD-10-CM | POA: Diagnosis not present

## 2023-12-28 DIAGNOSIS — E1165 Type 2 diabetes mellitus with hyperglycemia: Secondary | ICD-10-CM | POA: Diagnosis present

## 2023-12-28 DIAGNOSIS — E876 Hypokalemia: Secondary | ICD-10-CM | POA: Diagnosis not present

## 2023-12-28 DIAGNOSIS — Z794 Long term (current) use of insulin: Secondary | ICD-10-CM

## 2023-12-28 DIAGNOSIS — W19XXXA Unspecified fall, initial encounter: Secondary | ICD-10-CM

## 2023-12-28 DIAGNOSIS — R627 Adult failure to thrive: Secondary | ICD-10-CM | POA: Diagnosis not present

## 2023-12-28 DIAGNOSIS — G9340 Encephalopathy, unspecified: Secondary | ICD-10-CM | POA: Diagnosis not present

## 2023-12-28 DIAGNOSIS — I110 Hypertensive heart disease with heart failure: Secondary | ICD-10-CM | POA: Diagnosis present

## 2023-12-28 DIAGNOSIS — E860 Dehydration: Secondary | ICD-10-CM | POA: Diagnosis present

## 2023-12-28 DIAGNOSIS — W010XXA Fall on same level from slipping, tripping and stumbling without subsequent striking against object, initial encounter: Secondary | ICD-10-CM | POA: Diagnosis present

## 2023-12-28 DIAGNOSIS — Z888 Allergy status to other drugs, medicaments and biological substances status: Secondary | ICD-10-CM

## 2023-12-28 DIAGNOSIS — D649 Anemia, unspecified: Secondary | ICD-10-CM | POA: Diagnosis present

## 2023-12-28 DIAGNOSIS — I10 Essential (primary) hypertension: Secondary | ICD-10-CM | POA: Diagnosis not present

## 2023-12-28 DIAGNOSIS — Z6823 Body mass index (BMI) 23.0-23.9, adult: Secondary | ICD-10-CM

## 2023-12-28 DIAGNOSIS — Z9851 Tubal ligation status: Secondary | ICD-10-CM

## 2023-12-28 DIAGNOSIS — Z79899 Other long term (current) drug therapy: Secondary | ICD-10-CM

## 2023-12-28 DIAGNOSIS — E440 Moderate protein-calorie malnutrition: Secondary | ICD-10-CM | POA: Diagnosis not present

## 2023-12-28 LAB — CBC WITH DIFFERENTIAL/PLATELET
Abs Immature Granulocytes: 0.05 K/uL (ref 0.00–0.07)
Basophils Absolute: 0.1 K/uL (ref 0.0–0.1)
Basophils Relative: 1 %
Eosinophils Absolute: 0.2 K/uL (ref 0.0–0.5)
Eosinophils Relative: 2 %
HCT: 38.1 % (ref 36.0–46.0)
Hemoglobin: 11.7 g/dL — ABNORMAL LOW (ref 12.0–15.0)
Immature Granulocytes: 1 %
Lymphocytes Relative: 13 %
Lymphs Abs: 1.4 K/uL (ref 0.7–4.0)
MCH: 24.4 pg — ABNORMAL LOW (ref 26.0–34.0)
MCHC: 30.7 g/dL (ref 30.0–36.0)
MCV: 79.5 fL — ABNORMAL LOW (ref 80.0–100.0)
Monocytes Absolute: 0.8 K/uL (ref 0.1–1.0)
Monocytes Relative: 7 %
Neutro Abs: 8.2 K/uL — ABNORMAL HIGH (ref 1.7–7.7)
Neutrophils Relative %: 76 %
Platelets: 351 K/uL (ref 150–400)
RBC: 4.79 MIL/uL (ref 3.87–5.11)
RDW: 17.2 % — ABNORMAL HIGH (ref 11.5–15.5)
WBC: 10.6 K/uL — ABNORMAL HIGH (ref 4.0–10.5)
nRBC: 0 % (ref 0.0–0.2)

## 2023-12-28 LAB — COMPREHENSIVE METABOLIC PANEL WITH GFR
ALT: 13 U/L (ref 0–44)
AST: 15 U/L (ref 15–41)
Albumin: 2.8 g/dL — ABNORMAL LOW (ref 3.5–5.0)
Alkaline Phosphatase: 123 U/L (ref 38–126)
Anion gap: 7 (ref 5–15)
BUN: 14 mg/dL (ref 8–23)
CO2: 25 mmol/L (ref 22–32)
Calcium: 8.9 mg/dL (ref 8.9–10.3)
Chloride: 111 mmol/L (ref 98–111)
Creatinine, Ser: 0.57 mg/dL (ref 0.44–1.00)
GFR, Estimated: >60 mL/min (ref >60)
Glucose, Bld: 213 mg/dL — ABNORMAL HIGH (ref 70–99)
Potassium: 3.6 mmol/L (ref 3.5–5.1)
Sodium: 143 mmol/L (ref 135–145)
Total Bilirubin: 0.5 mg/dL (ref 0.0–1.2)
Total Protein: 6 g/dL — ABNORMAL LOW (ref 6.5–8.1)

## 2023-12-28 LAB — RAPID URINE DRUG SCREEN, HOSP PERFORMED
Amphetamines: NOT DETECTED
Barbiturates: NOT DETECTED
Benzodiazepines: POSITIVE — AB
Cocaine: NOT DETECTED
Opiates: NOT DETECTED
Tetrahydrocannabinol: NOT DETECTED

## 2023-12-28 LAB — URINALYSIS, W/ REFLEX TO CULTURE (INFECTION SUSPECTED)
Bilirubin Urine: NEGATIVE
Glucose, UA: 50 mg/dL — AB
Ketones, ur: NEGATIVE mg/dL
Nitrite: POSITIVE — AB
Protein, ur: 100 mg/dL — AB
Specific Gravity, Urine: 1.016 (ref 1.005–1.030)
WBC, UA: >50 WBC/hpf (ref 0–5)
pH: 5 (ref 5.0–8.0)

## 2023-12-28 LAB — CBG MONITORING, ED: Glucose-Capillary: 154 mg/dL — ABNORMAL HIGH (ref 70–99)

## 2023-12-28 LAB — CK: Total CK: 46 U/L (ref 38–234)

## 2023-12-28 LAB — ETHANOL: Alcohol, Ethyl (B): <15 mg/dL (ref <15)

## 2023-12-28 MED ORDER — SODIUM CHLORIDE 0.9 % IV SOLN
1.0000 g | Freq: Once | INTRAVENOUS | Status: AC
  Administered 2023-12-28: 1 g via INTRAVENOUS
  Filled 2023-12-28: qty 10

## 2023-12-28 MED ORDER — ONDANSETRON HCL 4 MG PO TABS: 4.0000 mg | ORAL_TABLET | Freq: Four times a day (QID) | ORAL | Status: DC | PRN

## 2023-12-28 MED ORDER — INSULIN ASPART 100 UNIT/ML IJ SOLN
0.0000 [IU] | Freq: Three times a day (TID) | INTRAMUSCULAR | Status: DC
  Administered 2023-12-29: 13:00:00 3 [IU] via SUBCUTANEOUS
  Administered 2023-12-30 – 2023-12-31 (×3): 2 [IU] via SUBCUTANEOUS
  Administered 2023-12-31 – 2024-01-01 (×4): 3 [IU] via SUBCUTANEOUS
  Administered 2024-01-01: 18:00:00 5 [IU] via SUBCUTANEOUS
  Filled 2023-12-28 (×2): qty 2
  Filled 2023-12-28 (×2): qty 3
  Filled 2023-12-28: qty 8
  Filled 2023-12-28: qty 5
  Filled 2023-12-28 (×2): qty 3
  Filled 2023-12-28: qty 2

## 2023-12-28 MED ORDER — ENOXAPARIN SODIUM 40 MG/0.4ML IJ SOSY
40.0000 mg | PREFILLED_SYRINGE | Freq: Every day | INTRAMUSCULAR | Status: DC
  Administered 2023-12-28 – 2023-12-31 (×4): 40 mg via SUBCUTANEOUS
  Filled 2023-12-28 (×4): qty 0.4

## 2023-12-28 MED ORDER — ACETAMINOPHEN 325 MG PO TABS
650.0000 mg | ORAL_TABLET | Freq: Four times a day (QID) | ORAL | Status: DC | PRN
  Administered 2023-12-30: 21:00:00 650 mg via ORAL
  Filled 2023-12-28 (×2): qty 2

## 2023-12-28 MED ORDER — ONDANSETRON HCL 4 MG/2ML IJ SOLN: 4.0000 mg | Freq: Four times a day (QID) | INTRAMUSCULAR | Status: DC | PRN

## 2023-12-28 MED ORDER — ACETAMINOPHEN 650 MG RE SUPP: 650.0000 mg | Freq: Four times a day (QID) | RECTAL | Status: DC | PRN

## 2023-12-28 MED ORDER — SENNOSIDES-DOCUSATE SODIUM 8.6-50 MG PO TABS: 1.0000 | ORAL_TABLET | Freq: Every evening | ORAL | Status: DC | PRN

## 2023-12-28 MED ORDER — LACTATED RINGERS IV SOLN: INTRAVENOUS | Status: AC

## 2023-12-28 MED ORDER — INSULIN ASPART 100 UNIT/ML IJ SOLN
0.0000 [IU] | Freq: Every day | INTRAMUSCULAR | Status: DC
  Administered 2023-12-29: 22:00:00 3 [IU] via SUBCUTANEOUS
  Administered 2023-12-30: 21:00:00 2 [IU] via SUBCUTANEOUS
  Filled 2023-12-28: qty 3
  Filled 2023-12-28: qty 2

## 2023-12-28 MED ORDER — BISACODYL 5 MG PO TBEC: 5.0000 mg | DELAYED_RELEASE_TABLET | Freq: Every day | ORAL | Status: DC | PRN

## 2023-12-28 NOTE — ED Triage Notes (Signed)
 Pt BIB [facility] from home d/t generalized weakness that has been going on for 2-3 days. Not able to sit or stand today. Baseline is walking around w a walker. Pt has no family in the area. Pt was found by a friend on the couch. Afebrile. No other  complaints. Pt had a fall earlier today, but no injuries noted. Pt is incontinent.   Tachy 98-102  BP 128/60 Cbg 244 Temp 97.9

## 2023-12-28 NOTE — H&P (Signed)
 History and Physical  Allison Hughes FMW:[id number] DOB: 03/03/1950 DOA: 12/28/2023  PCP: Valma Carwin, MD   Chief Complaint: Generalized weakness, fall, altered mental status  HPI: Allison Hughes is a 73 y.o. female with medical history significant for HTN, HLD, CVA, COPD, T2DM, HFpEF aortic mural thrombus, mild cognitive impairment, GI bleed and recurrent falls who presents to the ED for evaluation of generalized weakness, mild confusion and a fall at home. Of note, patient has had multiple hospitalizations over the last 7 months, all with similar complaints. On evaluation, patient quite weak and deconditioned.  She has mild delayed thought process but able to tell me her full name, DOB, current location, current year and the current president.  States she fell outside her house and she was told some young girls found her. She endorses feeling weak with poor p.o. intake as well as occasional burning with sensation but denies any recent fevers, chills, nausea, vomiting, abdominal pain, chest pain or shortness of breath. Collateral obtained from patient's emergency contact, Mr. Adin. He informed me that he has been assisting patient in her home daily, often helps her with the cooking, medications and most of her ADLs. Today while at patient's house, patient tripped and fell softly onto the ground. He was unable to get her up so he called the fire department. He reports that over the last few days, patient has become significantly weak, deconditioned, intermittently disoriented and difficulty with feeding herself.   ED Course: Initial vitals show patient afebrile and normotensive. Initial labs significant for WBC 10.6, Hgb 11.7, glucose 213, albumin 2.8, normal white count, ethanol levels and renal function, UDS positive for benzo, UA shows mild glucosuria, hemoglobinuria, positive nitrite, large leuks, RBCs 21-50, WBC >50 and many bacteria. CXR shows shows interstitial prominence bilaterally and  mild airspace disease on the lung bases. Pt received IV Rocephin . TRH was consulted for admission.   Review of Systems: Please see HPI for pertinent positives and negatives. A complete 10 system review of systems are otherwise negative.  Past Medical History:  Diagnosis Date   Acute metabolic encephalopathy 10/20/2023   Acute pyelonephritis 02/02/2016   Acute respiratory failure with hypoxia (HCC) 09/20/2023   Diabetes mellitus 01/15/2004   T2DM   Hyperlipidemia    Hypertension    Obesity (BMI 30-39.9)    Pneumonia 09/06/2020   Sepsis (HCC) 02/02/2016   Stroke (HCC)    Urinary tract infection 10/20/2023   Past Surgical History:  Procedure Laterality Date   CESAREAN SECTION     LOOP RECORDER INSERTION N/A 09/08/2020   Procedure: LOOP RECORDER INSERTION;  Surgeon: Inocencio Soyla Lunger, MD;  Location: [facility] INVASIVE CV LAB;  Service: Cardiovascular;  Laterality: N/A;   ORIF ANKLE FRACTURE  05/11/2011   Procedure: OPEN REDUCTION INTERNAL FIXATION (ORIF) ANKLE FRACTURE;  Surgeon: Jerona LULLA Sage, MD;  Location: [facility] OR;  Service: Orthopedics;  Laterality: Right;   TUBAL LIGATION     Social History:  reports that she has been smoking cigarettes. She has a 40 pack-year smoking history. She quit smokeless tobacco use about 12 years ago. She reports that she does not drink alcohol and does not use drugs.  Allergies[1]  Family History  Problem Relation Age of Onset   Drug abuse Daughter    Diabetes Daughter      Prior to Admission medications  Medication Sig Start Date End Date Taking? Authorizing Provider  acetaminophen  (TYLENOL ) 500 MG tablet Take 2 tablets (1,000 mg total) by mouth every 8 (eight)  hours as needed for mild pain (pain score 1-3) or headache. 09/23/23   Kathrin Mignon DASEN, MD  albuterol  (VENTOLIN  HFA) 108 (90 Base) MCG/ACT inhaler Inhale 2 puffs into the lungs every 6 (six) hours as needed for wheezing or shortness of breath. Patient not taking: Reported on 12/01/2023 06/06/22    Raenelle Donalda HERO, MD  ALPRAZolam  (XANAX ) 0.25 MG tablet Take 1 tablet (0.25 mg total) by mouth 2 (two) times daily as needed for anxiety. Patient taking differently: Take 0.25 mg by mouth daily. 09/23/23   Gonfa, Taye T, MD  aspirin  EC 81 MG tablet Take 1 tablet (81 mg total) by mouth daily. Swallow whole. 12/05/23   Patsy Lenis, MD  atorvastatin  (LIPITOR) 40 MG tablet Take 40 mg by mouth daily. 08/06/18   [provider]  Fluticasone -Umeclidin-Vilant (TRELEGY ELLIPTA) 100-62.5-25 MCG/ACT AEPB Inhale 1 puff into the lungs daily at 12 noon. Patient not taking: Reported on 12/01/2023    [provider]  lisinopril  (ZESTRIL ) 30 MG tablet Take 1 tablet (30 mg total) by mouth at bedtime. 10/31/23   Fausto Burnard LABOR, DO  metFORMIN  (GLUCOPHAGE ) 500 MG tablet Take 500 mg by mouth at bedtime. 11/26/23   [provider]  pantoprazole  (PROTONIX ) 40 MG tablet Take 1 tablet (40 mg total) by mouth 2 (two) times daily for 30 days, THEN 1 tablet (40 mg total) daily. Patient taking differently: Take 1 tablet (40 mg total) daily. 09/23/23 12/22/23  Gonfa, Taye T, MD  QUEtiapine  (SEROQUEL ) 50 MG tablet Take 1 tablet (50 mg total) by mouth 2 (two) times daily. 12/04/23   Patsy Lenis, MD  TRESIBA  100 UNIT/ML SOLN Inject 15 Units into the skin at bedtime. 09/23/23   Gonfa, Taye T, MD    Physical Exam: BP 120/62   Pulse 87   Temp (!) 97 F (36.1 C) (Temporal)   Resp 18   Ht 5' 7 (1.702 m)   Wt 68 kg   SpO2 97%   BMI 23.48 kg/m  General: Pleasant, very weak appearing elderly woman laying in bed. No acute distress. HEENT: [place]/AT. Anicteric sclera. Poor dentition. Dry mucous membrane. Healing right periorbital contusion. Hard of hearing. CV: RRR. No murmurs, rubs, or gallops. No LE edema Pulmonary: Lungs CTAB. Normal effort. No wheezing or rales. Abdominal: Soft, nontender, nondistended. Normal bowel sounds. Extremities: Palpable radial and DP pulses. Normal ROM. Skin: Warm and dry.  No obvious rash or lesions. Neuro: A&Ox3. Slightly delayed thought process. Poor memory. Moves all extremities. Normal sensation to light touch. No focal deficit. Psych: Normal mood and affect          Labs on Admission:  Basic Metabolic Panel: Recent Labs  Lab 12/28/23 1815  NA 143  K 3.6  CL 111  CO2 25  GLUCOSE 213*  BUN 14  CREATININE 0.57  CALCIUM  8.9   Liver Function Tests: Recent Labs  Lab 12/28/23 1815  AST 15  ALT 13  ALKPHOS 123  BILITOT 0.5  PROT 6.0*  ALBUMIN 2.8*   No results for input(s): LIPASE, AMYLASE in the last 168 hours. No results for input(s): AMMONIA in the last 168 hours. CBC: Recent Labs  Lab 12/28/23 1815  WBC 10.6*  NEUTROABS 8.2*  HGB 11.7*  HCT 38.1  MCV 79.5*  PLT 351   Cardiac Enzymes: Recent Labs  Lab 12/28/23 1815  CKTOTAL 46   BNP (last 3 results) Recent Labs    09/19/23 1943 10/20/23 1625  BNP 78.7 144.1*    ProBNP (  last 3 results) No results for input(s): PROBNP in the last 8760 hours.  CBG: No results for input(s): GLUCAP in the last 168 hours.  Radiological Exams on Admission: CT Cervical Spine Wo Contrast Result Date: 12/28/2023 EXAM: CT CERVICAL SPINE WITHOUT CONTRAST 12/28/2023 07:16:52 PM TECHNIQUE: CT of the cervical spine was performed without the administration of intravenous contrast. Multiplanar reformatted images are provided for review. Automated exposure control, iterative reconstruction, and/or weight based adjustment of the mA/kV was utilized to reduce the radiation dose to as low as reasonably achievable. COMPARISON: None available. CLINICAL HISTORY: Neck trauma (Age >= 65y) FINDINGS: BONES AND ALIGNMENT: No acute fracture or traumatic malalignment. DEGENERATIVE CHANGES: No significant degenerative changes. SOFT TISSUES: No prevertebral soft tissue swelling. IMPRESSION: 1. No significant abnormality Electronically signed by: Pinkie Pebbles MD 12/28/2023 07:27 PM EST RP Workstation:  [id number]   CT Head Wo Contrast Result Date: 12/28/2023 EXAM: CT HEAD WITHOUT 12/28/2023 07:16:52 PM TECHNIQUE: CT of the head was performed without the administration of intravenous contrast. Automated exposure control, iterative reconstruction, and/or weight based adjustment of the mA/kV was utilized to reduce the radiation dose to as low as reasonably achievable. COMPARISON: 11/29/2023 CLINICAL HISTORY: Head trauma, minor (Age >= 65y) FINDINGS: BRAIN AND VENTRICLES: No acute intracranial hemorrhage. No mass effect or midline shift. No extra-axial fluid collection. Generalized cerebral atrophy and advanced chronic small vessel ischemic disease. 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 Bilateral basal ganglia mineralization. Intracranial arterial calcification. No hydrocephalus. ORBITS: No acute abnormality. SINUSES AND MASTOIDS: No acute abnormality. SOFT TISSUES AND SKULL: No acute skull fracture. No acute soft tissue abnormality. IMPRESSION: 1. No acute intracranial abnormality. 2. Old right parietal and left cerebellar infarcts. Electronically signed by: Pinkie Pebbles MD 12/28/2023 07:25 PM EST RP Workstation: [id number]   DG Chest Port 1 View Result Date: 12/28/2023 CLINICAL DATA:  Fall.  Generalized weakness. EXAM: PORTABLE CHEST 1 VIEW COMPARISON:  11/29/2023. FINDINGS: The heart size and mediastinal contours are within normal limits. There is atherosclerotic calcification of the aorta. The pulmonary vasculature is distended. Perihilar interstitial prominence is noted bilaterally. There is mild airspace disease at the lung bases. A loop recorder device is seen over the left chest. No consolidation, effusion, or pneumothorax. No acute osseous abnormality. IMPRESSION: 1. Mildly distended pulmonary vasculature with interstitial prominence bilaterally, possible edema or infiltrate. 2. Mild airspace disease at the lung bases, possible atelectasis or infiltrate.  Electronically Signed   By: Leita Birmingham M.D.   On: 12/28/2023 19:10   My independent interpretation of EKG: Sinus rhythm with low voltage  Assessment/Plan Allison Hughes is a 73 y.o. female with medical history significant for HTN, HLD, CVA, COPD, T2DM, HFpEF, aortic mural thrombus, mild cognitive impairment, GI bleed, anxiety and recurrent falls who presents to the ED for evaluation of generalized weakness, mild confusion and a fall at home and admitted for acute metabolic encephalopathy and acute cystitis.  # Acute metabolic encephalopathy # Hx of mild cognitive impairment - Patient with mild cognitive impairment that has not been formally diagnosed presenting to the ED again for evaluation of change in mental status - Patient with similar presentations in the last few months - On my evaluation, patient weak-appearing but alert and oriented x3 - Patient has brief change in mental status likely due to her urinary infection, CT head negative - Avoid centrally acting medications - Start IV LR 100 cc/h for 1 day - Delirium precautions  # Acute cystitis - Pt presented with generalized weakness, mild  confusion and intermittent burning with urination - Urinalysis shows signs of infection - Continue IV rocephin  - F/u urine culture - Trend CBC and fever curve  # Failure to thrive # Generalized weakness # Frequent falls - Patient has had multiple hospitalizations due to generalized weakness, falls and failure to thrive - She has been evaluated by TOC in the past and patient has refused to be placed in a nursing facility/memory care - Friend reports he is unable to care for patient fully due to his own health problems - TOC consulted, appreciate assistance with possible placement versus obtaining a home health aide - PT/OT eval and treat  # T2DM, well-controlled - Last A1c 6.9% 3 months ago - Blood glucose of 213->154 on admission - Home regimen includes Tresiba  15 units at bedtime -  Start Semglee  10 units at bedtime, titrate up as needed - SSI with meals, CBG monitoring - Carb modified diet  # HFpEF - Patient quite dry on exam - IV hydration as above  # Hx of CVA - Continue aspirin  and atorvastatin   # HTN - BP stable with SBP in the 120-140s - Continue lisinopril   # Hx of mural thrombus - History of aortic mural thrombus found on past CT - Continue aspirin  and atorvastatin  (recommended by vascular surgery during previous hospitalization) - Follow-up with vascular surgery in the outpatient  # Anxiety - Resume home Xanax  after further improvement in mental status  # COPD - Chronic and stable, not in acute exacerbation - As needed DuoNebs  # GERD - Continue Protonix    DVT prophylaxis: Lovenox      Code Status: Full Code  Consults called: None  Family Communication: Discussed results/findings and plan for admission with friend, Mr. Adin, over the phone  Severity of Illness: The appropriate patient status for this patient is INPATIENT. Inpatient status is judged to be reasonable and necessary in order to provide the required intensity of service to ensure the patient's safety. The patient's presenting symptoms, physical exam findings, and initial radiographic and laboratory data in the context of their chronic comorbidities is felt to place them at high risk for further clinical deterioration. Furthermore, it is not anticipated that the patient will be medically stable for discharge from the hospital within 2 midnights of admission.   * I certify that at the point of admission it is my clinical judgment that the patient will require inpatient hospital care spanning beyond 2 midnights from the point of admission due to high intensity of service, high risk for further deterioration and high frequency of surveillance required.*  Level of care: Telemetry   I personally spent a total of 75 minutes in the care of the patient today including preparing to see  the patient, getting/reviewing separately obtained history, performing a medically appropriate exam/evaluation, placing orders, documenting clinical information in the EHR, independently interpreting results, and communicating results.   Lou Claretta HERO, MD 12/28/2023, 10:05 PM [facility] Pager: [phone] Isaiah [phone]   If 7PM-7AM, please contact night-coverage [email or web address] Password [email or web address]     [1]  Allergies Allergen Reactions   Iodine Swelling   Shrimp [Shellfish Allergy] Swelling    Fresh shrimp

## 2023-12-28 NOTE — ED Provider Notes (Signed)
 [place] EMERGENCY DEPARTMENT AT [facility] Provider Note   CSN: [id number] Arrival date & time: 12/28/23  1740     Patient presents with: Weakness   Allison Hughes is a 73 y.o. female.   Patient is a 73 year old female with past medical history diabetes, hypertension, CHF presenting to the emergency department with weakness.  Per EMS, the patient has had generalized weakness for the last few days.  Normally ambulates with a walker at baseline.  Patient reports to me that she felt weak in the bathroom today and fell.  She states that her boyfriend found her who called 911.  Unclear how long she was on the ground for.  She denies hitting her head today did have bruising to her eye that she reports is from a fall a few days ago.  She denies any other pain or injuries from the fall.  She denies any recent fevers, nausea, vomiting or diarrhea and reports that she has been eating and drinking normally and taking her medications as prescribed.   Weakness      Prior to Admission medications  Medication Sig Start Date End Date Taking? Authorizing Provider  acetaminophen  (TYLENOL ) 500 MG tablet Take 2 tablets (1,000 mg total) by mouth every 8 (eight) hours as needed for mild pain (pain score 1-3) or headache. 09/23/23   Kathrin Mignon DASEN, MD  albuterol  (VENTOLIN  HFA) 108 (90 Base) MCG/ACT inhaler Inhale 2 puffs into the lungs every 6 (six) hours as needed for wheezing or shortness of breath. Patient not taking: Reported on 12/01/2023 06/06/22   Raenelle Donalda HERO, MD  ALPRAZolam  (XANAX ) 0.25 MG tablet Take 1 tablet (0.25 mg total) by mouth 2 (two) times daily as needed for anxiety. Patient taking differently: Take 0.25 mg by mouth daily. 09/23/23   Gonfa, Taye T, MD  aspirin  EC 81 MG tablet Take 1 tablet (81 mg total) by mouth daily. Swallow whole. 12/05/23   Patsy Lenis, MD  atorvastatin  (LIPITOR) 40 MG tablet Take 40 mg by mouth daily. 08/06/18   [provider]   Fluticasone -Umeclidin-Vilant (TRELEGY ELLIPTA) 100-62.5-25 MCG/ACT AEPB Inhale 1 puff into the lungs daily at 12 noon. Patient not taking: Reported on 12/01/2023    [provider]  lisinopril  (ZESTRIL ) 30 MG tablet Take 1 tablet (30 mg total) by mouth at bedtime. 10/31/23   Fausto Burnard LABOR, DO  metFORMIN  (GLUCOPHAGE ) 500 MG tablet Take 500 mg by mouth at bedtime. 11/26/23   [provider]  pantoprazole  (PROTONIX ) 40 MG tablet Take 1 tablet (40 mg total) by mouth 2 (two) times daily for 30 days, THEN 1 tablet (40 mg total) daily. Patient taking differently: Take 1 tablet (40 mg total) daily. 09/23/23 12/22/23  Gonfa, Taye T, MD  QUEtiapine  (SEROQUEL ) 50 MG tablet Take 1 tablet (50 mg total) by mouth 2 (two) times daily. 12/04/23   Patsy Lenis, MD  TRESIBA  100 UNIT/ML SOLN Inject 15 Units into the skin at bedtime. 09/23/23   Gonfa, Taye T, MD    Allergies: Iodine and Shrimp [shellfish allergy]    Review of Systems  Neurological:  Positive for weakness.    Updated Vital Signs BP 120/62   Pulse 87   Temp (!) 97 F (36.1 C) (Temporal)   Resp 18   Ht 5' 7 (1.702 m)   Wt 68 kg   SpO2 97%   BMI 23.48 kg/m   Physical Exam Vitals and nursing note reviewed.  Constitutional:      General:  She is not in acute distress.    Appearance: She is ill-appearing.     Comments: Eyes closed with arms crossed across chest but awake. Slow to respond to questions  HENT:     Head: Normocephalic and atraumatic.     Nose: Nose normal.     Mouth/Throat:     Mouth: Mucous membranes are moist.     Pharynx: Oropharynx is clear.  Eyes:     Extraocular Movements: Extraocular movements intact.     Conjunctiva/sclera: Conjunctivae normal.     Comments: R periorbital contusion  Cardiovascular:     Rate and Rhythm: Normal rate and regular rhythm.     Heart sounds: Normal heart sounds.  Pulmonary:     Effort: Pulmonary effort is normal.     Breath sounds: Normal breath sounds.   Abdominal:     General: Abdomen is flat.     Palpations: Abdomen is soft.     Tenderness: There is no abdominal tenderness.  Musculoskeletal:     Cervical back: Normal range of motion and neck supple.     Comments: No bony tenderness to bilateral UE or LE Pelvis stable, non-tender  Skin:    General: Skin is warm and dry.  Neurological:     General: No focal deficit present.     Comments: Oriented to person and place  Psychiatric:        Mood and Affect: Mood normal.     (all labs ordered are listed, but only abnormal results are displayed) Labs Reviewed  COMPREHENSIVE METABOLIC PANEL WITH GFR - Abnormal; Notable for the following components:      Result Value   Glucose, Bld 213 (*)    Total Protein 6.0 (*)    Albumin 2.8 (*)    All other components within normal limits  CBC WITH DIFFERENTIAL/PLATELET - Abnormal; Notable for the following components:   WBC 10.6 (*)    Hemoglobin 11.7 (*)    MCV 79.5 (*)    MCH 24.4 (*)    RDW 17.2 (*)    Neutro Abs 8.2 (*)    All other components within normal limits  URINALYSIS, W/ REFLEX TO CULTURE (INFECTION SUSPECTED) - Abnormal; Notable for the following components:   APPearance TURBID (*)    Glucose, UA 50 (*)    Hgb urine dipstick SMALL (*)    Protein, ur 100 (*)    Nitrite POSITIVE (*)    Leukocytes,Ua LARGE (*)    Bacteria, UA MANY (*)    All other components within normal limits  RAPID URINE DRUG SCREEN, HOSP PERFORMED - Abnormal; Notable for the following components:   Benzodiazepines POSITIVE (*)    All other components within normal limits  URINE CULTURE  ETHANOL  CK  BRAIN NATRIURETIC PEPTIDE    EKG: EKG Interpretation Date/Time:  Sunday December 28 2023 18:35:36 EST Ventricular Rate:  94 PR Interval:  115 QRS Duration:  72 QT Interval:  355 QTC Calculation: 444 R Axis:   46  Text Interpretation: Sinus rhythm Borderline short PR interval No significant change since last tracing Confirmed by Ellouise Fine ([id number]) on 12/28/2023 8:17:58 PM  Radiology: CT Cervical Spine Wo Contrast Result Date: 12/28/2023 EXAM: CT CERVICAL SPINE WITHOUT CONTRAST 12/28/2023 07:16:52 PM TECHNIQUE: CT of the cervical spine was performed without the administration of intravenous contrast. Multiplanar reformatted images are provided for review. Automated exposure control, iterative reconstruction, and/or weight based adjustment of the mA/kV was utilized to reduce the radiation dose to as  low as reasonably achievable. COMPARISON: None available. CLINICAL HISTORY: Neck trauma (Age >= 65y) FINDINGS: BONES AND ALIGNMENT: No acute fracture or traumatic malalignment. DEGENERATIVE CHANGES: No significant degenerative changes. SOFT TISSUES: No prevertebral soft tissue swelling. IMPRESSION: 1. No significant abnormality Electronically signed by: Pinkie Pebbles MD 12/28/2023 07:27 PM EST RP Workstation: [id number]   CT Head Wo Contrast Result Date: 12/28/2023 EXAM: CT HEAD WITHOUT 12/28/2023 07:16:52 PM TECHNIQUE: CT of the head was performed without the administration of intravenous contrast. Automated exposure control, iterative reconstruction, and/or weight based adjustment of the mA/kV was utilized to reduce the radiation dose to as low as reasonably achievable. COMPARISON: 11/29/2023 CLINICAL HISTORY: Head trauma, minor (Age >= 65y) FINDINGS: BRAIN AND VENTRICLES: No acute intracranial hemorrhage. No mass effect or midline shift. No extra-axial fluid collection. Generalized cerebral atrophy and advanced chronic small vessel ischemic disease. 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 Bilateral basal ganglia mineralization. Intracranial arterial calcification. No hydrocephalus. ORBITS: No acute abnormality. SINUSES AND MASTOIDS: No acute abnormality. SOFT TISSUES AND SKULL: No acute skull fracture. No acute soft tissue abnormality. IMPRESSION: 1. No acute intracranial abnormality. 2. 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 Electronically signed by: Pinkie Pebbles MD 12/28/2023 07:25 PM EST RP Workstation: [id number]   DG Chest Port 1 View Result Date: 12/28/2023 CLINICAL DATA:  Fall.  Generalized weakness. EXAM: PORTABLE CHEST 1 VIEW COMPARISON:  11/29/2023. FINDINGS: The heart size and mediastinal contours are within normal limits. There is atherosclerotic calcification of the aorta. The pulmonary vasculature is distended. Perihilar interstitial prominence is noted bilaterally. There is mild airspace disease at the lung bases. A loop recorder device is seen over the left chest. No consolidation, effusion, or pneumothorax. No acute osseous abnormality. IMPRESSION: 1. Mildly distended pulmonary vasculature with interstitial prominence bilaterally, possible edema or infiltrate. 2. Mild airspace disease at the lung bases, possible atelectasis or infiltrate. Electronically Signed   By: Leita Birmingham M.D.   On: 12/28/2023 19:10     Procedures   Medications Ordered in the ED  cefTRIAXone  (ROCEPHIN ) 1 g in sodium chloride  0.9 % 100 mL IVPB (1 g Intravenous New Bag/Given 12/28/23 2023)    Clinical Course as of 12/28/23 2048  Sun Dec 28, 2023  2015 UA positive for UTI, likely cause of weakness. With patient's altered mental status and weakness will be recommended admission for antibiotics. No traumatic injury on imaging. Infiltrate vs edema on CXR and BNP added on.  [VK]    Clinical Course User Index [VK] Kingsley, Vallery Mcdade K, DO                                 Medical Decision Making This patient presents to the ED with chief complaint(s) of weakness, fall with pertinent past medical history of HTN, DM, CHF which further complicates the presenting complaint. The complaint involves an extensive differential diagnosis and also carries with it a high risk of complications and morbidity.    The differential diagnosis includes ICH, mass effect, cervical spine fracture, infection,  dehydration, electrolyte abnormality, rhabdo  Additional history obtained: Additional history obtained from EMS  Records reviewed previous admission documents  ED Course and Reassessment: On patient's arrival she was hemodynamically stable in no acute distress but ill-appearing.  Patient will have workup performed to evaluate for causes of her weakness as well as any injuries and she will be closely reassessed.  Independent labs interpretation:  The following labs  were independently interpreted: UA positive for UTI, hyperglycemia without DKA, mild leukocytosis  Independent visualization of imaging: - I independently visualized the following imaging with scope of interpretation limited to determining acute life threatening conditions related to emergency care: CTH/C-spine, CXR, which revealed no acute traumatic injury, pulmonary edema vs infiltrate on CXR  Consultation: - Consulted or discussed management/test interpretation w/ external professional: hospitalist  Consideration for admission or further workup: patient requires admission for AMS and weakness in the setting of UTI Social Determinants of health: N/A    Amount and/or Complexity of Data Reviewed Labs: ordered. Radiology: ordered.  Risk Decision regarding hospitalization.       Final diagnoses:  Acute cystitis with hematuria  Generalized weakness  Fall, initial encounter  Disorientation    ED Discharge Orders     None          Kingsley, Camran Keady K, DO 12/28/23 2048

## 2023-12-29 DIAGNOSIS — J449 Chronic obstructive pulmonary disease, unspecified: Secondary | ICD-10-CM

## 2023-12-29 DIAGNOSIS — R296 Repeated falls: Secondary | ICD-10-CM

## 2023-12-29 DIAGNOSIS — R531 Weakness: Secondary | ICD-10-CM

## 2023-12-29 DIAGNOSIS — N3001 Acute cystitis with hematuria: Principal | ICD-10-CM

## 2023-12-29 DIAGNOSIS — R627 Adult failure to thrive: Secondary | ICD-10-CM

## 2023-12-29 LAB — CBC
HCT: 35.2 % — ABNORMAL LOW (ref 36.0–46.0)
Hemoglobin: 11 g/dL — ABNORMAL LOW (ref 12.0–15.0)
MCH: 25.2 pg — ABNORMAL LOW (ref 26.0–34.0)
MCHC: 31.3 g/dL (ref 30.0–36.0)
MCV: 80.7 fL (ref 80.0–100.0)
Platelets: 333 K/uL (ref 150–400)
RBC: 4.36 MIL/uL (ref 3.87–5.11)
RDW: 17.3 % — ABNORMAL HIGH (ref 11.5–15.5)
WBC: 8.6 K/uL (ref 4.0–10.5)
nRBC: 0 % (ref 0.0–0.2)

## 2023-12-29 LAB — BASIC METABOLIC PANEL WITH GFR
Anion gap: 7 (ref 5–15)
BUN: 13 mg/dL (ref 8–23)
CO2: 26 mmol/L (ref 22–32)
Calcium: 8.8 mg/dL — ABNORMAL LOW (ref 8.9–10.3)
Chloride: 108 mmol/L (ref 98–111)
Creatinine, Ser: 0.49 mg/dL (ref 0.44–1.00)
GFR, Estimated: >60 mL/min (ref >60)
Glucose, Bld: 132 mg/dL — ABNORMAL HIGH (ref 70–99)
Potassium: 3.5 mmol/L (ref 3.5–5.1)
Sodium: 141 mmol/L (ref 135–145)

## 2023-12-29 LAB — CBG MONITORING, ED
Glucose-Capillary: 140 mg/dL — ABNORMAL HIGH (ref 70–99)
Glucose-Capillary: 171 mg/dL — ABNORMAL HIGH (ref 70–99)
Glucose-Capillary: 87 mg/dL (ref 70–99)

## 2023-12-29 LAB — GLUCOSE, CAPILLARY: Glucose-Capillary: 262 mg/dL — ABNORMAL HIGH (ref 70–99)

## 2023-12-29 LAB — BRAIN NATRIURETIC PEPTIDE: B Natriuretic Peptide: 33.5 pg/mL (ref 0.0–100.0)

## 2023-12-29 MED ORDER — SODIUM CHLORIDE 0.9 % IV SOLN
1.0000 g | INTRAVENOUS | Status: DC
  Administered 2023-12-29: 19:00:00 1 g via INTRAVENOUS
  Filled 2023-12-29: qty 10

## 2023-12-29 MED ORDER — INSULIN GLARGINE 100 UNIT/ML ~~LOC~~ SOLN
10.0000 [IU] | Freq: Every day | SUBCUTANEOUS | Status: DC
  Administered 2023-12-29 – 2023-12-31 (×4): 10 [IU] via SUBCUTANEOUS
  Filled 2023-12-29 (×5): qty 0.1

## 2023-12-29 MED ORDER — ASPIRIN 81 MG PO TBEC
81.0000 mg | DELAYED_RELEASE_TABLET | Freq: Every day | ORAL | Status: DC
  Administered 2023-12-29 – 2024-01-01 (×4): 81 mg via ORAL
  Filled 2023-12-29 (×4): qty 1

## 2023-12-29 MED ORDER — LISINOPRIL 20 MG PO TABS
30.0000 mg | ORAL_TABLET | Freq: Every day | ORAL | Status: DC
  Administered 2023-12-29 – 2023-12-31 (×3): 30 mg via ORAL
  Filled 2023-12-29 (×3): qty 1

## 2023-12-29 MED ORDER — HALOPERIDOL LACTATE 5 MG/ML IJ SOLN
1.0000 mg | Freq: Four times a day (QID) | INTRAMUSCULAR | Status: DC | PRN
  Administered 2023-12-29 – 2023-12-30 (×2): 1 mg via INTRAVENOUS
  Filled 2023-12-29 (×2): qty 1

## 2023-12-29 MED ORDER — IPRATROPIUM-ALBUTEROL 0.5-2.5 (3) MG/3ML IN SOLN: 3.0000 mL | Freq: Four times a day (QID) | RESPIRATORY_TRACT | Status: DC | PRN

## 2023-12-29 MED ORDER — ATORVASTATIN CALCIUM 40 MG PO TABS
40.0000 mg | ORAL_TABLET | Freq: Every day | ORAL | Status: DC
  Administered 2023-12-29 – 2024-01-01 (×4): 40 mg via ORAL
  Filled 2023-12-29 (×4): qty 1

## 2023-12-29 MED ORDER — PANTOPRAZOLE SODIUM 40 MG PO TBEC
40.0000 mg | DELAYED_RELEASE_TABLET | Freq: Every day | ORAL | Status: DC
  Administered 2023-12-29: 14:00:00 40 mg via ORAL
  Filled 2023-12-29: qty 1

## 2023-12-29 NOTE — ED Notes (Signed)
 Pt. Is starting to say that we are holding her hostage and that she also wants to speak to the head of the hospital; Contact person, Manus, called and made aware; MD also made aware

## 2023-12-29 NOTE — Evaluation (Signed)
 Occupational Therapy Evaluation Patient Details Name: Allison Hughes MRN: [id number] DOB: [birth year] Today's Date: 12/29/2023   History of Present Illness   73 yo F adm 12/14 fall earlier in the day and found on couch by friend. Pt found to have UTI. PMH 11/15 fall, 10/8-10/26 fall, 9/5-9/10 fall, HTN HLD CVA COPD DM2 GIB, Obesity admission [facility] 08[facility]2023, aphasia     Clinical Impressions Patient admitted for the diagnosis above.  Limited OT eval due to poor level of alertness and inability to answer any questions.  Most of the prior living taken from prior admits.  OT to follow in the acute setting to ascertain status and make post acute recommendations.  Patient will benefit from continued inpatient follow up therapy, <3 hours/day.     If plan is discharge home, recommend the following:   Assist for transportation;Assistance with cooking/housework;Two people to help with walking and/or transfers;A lot of help with bathing/dressing/bathroom;Supervision due to cognitive status     Functional Status Assessment   Patient has had a recent decline in their functional status and demonstrates the ability to make significant improvements in function in a reasonable and predictable amount of time.     Equipment Recommendations   None recommended by OT     Recommendations for Other Services         Precautions/Restrictions   Precautions Precautions: Fall Recall of Precautions/Restrictions: Impaired Restrictions Weight Bearing Restrictions Per Provider Order: No     Mobility Bed Mobility Overal bed mobility: Needs Assistance Bed Mobility: Sidelying to Sit, Sit to Sidelying   Sidelying to sit: Max assist     Sit to sidelying: Max assist   Patient Response: Cooperative  Transfers                   General transfer comment: NT      Balance Overall balance assessment: Needs assistance Sitting-balance support: Feet supported Sitting balance-Leahy  Scale: Poor   Postural control: Posterior lean, Right lateral lean                                 ADL either performed or assessed with clinical judgement   ADL Overall ADL's : Needs assistance/impaired Eating/Feeding: Maximal assistance   Grooming: Maximal assistance   Upper Body Bathing: Maximal assistance   Lower Body Bathing: Total assistance   Upper Body Dressing : Maximal assistance   Lower Body Dressing: Total assistance   Toilet Transfer: Total assistance;+2 for physical assistance                   Vision   Vision Assessment?: Vision impaired- to be further tested in functional context     Perception Perception: Not tested       Praxis Praxis: Not tested       Pertinent Vitals/Pain Pain Assessment Pain Assessment: No/denies pain     Extremity/Trunk Assessment Upper Extremity Assessment Upper Extremity Assessment: Difficult to assess due to impaired cognition   Lower Extremity Assessment Lower Extremity Assessment: Defer to PT evaluation   Cervical / Trunk Assessment Cervical / Trunk Assessment: Kyphotic   Communication Communication Communication: Impaired Factors Affecting Communication: Difficulty expressing self;Hearing impaired;Reduced clarity of speech   Cognition Arousal: Obtunded Behavior During Therapy: Flat affect Cognition: Cognition impaired   Orientation impairments: Place, Time, Situation     Attention impairment (select first level of impairment): Focused attention Executive functioning impairment (select all impairments): Initiation  Following commands: Impaired Following commands impaired: Follows one step commands inconsistently     Cueing  General Comments   Cueing Techniques: Verbal cues;Gestural cues;Tactile cues      Exercises     Shoulder Instructions      Home Living Family/patient expects to be discharged to:: Private residence Living Arrangements:  Alone Available Help at Discharge: Available PRN/intermittently;Friend(s) Type of Home: House Home Access: Stairs to enter [employer] of Steps: 3 Entrance Stairs-Rails: Right;Left;Can reach both Home Layout: One level     Bathroom Shower/Tub: Chief Strategy Officer: Standard Bathroom Accessibility: Yes How Accessible: Accessible via walker Home Equipment: Rolling Walker (2 wheels);Cane - single point;Shower seat;BSC/3in1   Additional Comments: reports friend phil helps her out at home daily.  All information above taken from prior hospitalization.  Patient unable to answer any questions. 12/15      Prior Functioning/Environment Prior Level of Function : Needs assist               ADLs Comments: Per prior admit, she requires help from friend (Mr Adin) for showering, cooking, and dressing.    OT Problem List: Decreased strength;Decreased activity tolerance;Impaired balance (sitting and/or standing);Decreased safety awareness;Decreased cognition   OT Treatment/Interventions: Self-care/ADL training;Therapeutic activities;Patient/family education;Balance training;DME and/or AE instruction      OT Goals(Current goals can be found in the care plan section)   Acute Rehab OT Goals OT Goal Formulation: Patient unable to participate in goal setting Time For Goal Achievement: 01/12/24 Potential to Achieve Goals: Fair ADL Goals Pt Will Perform Eating: with supervision;sitting Pt Will Perform Grooming: with supervision;sitting Pt Will Perform Upper Body Bathing: with min assist;sitting Pt Will Perform Upper Body Dressing: with min assist;sitting   OT Frequency:  Min 2X/week    Co-evaluation              AM-PAC OT 6 Clicks Daily Activity     Outcome Measure Help from another person eating meals?: A Lot Help from another person taking care of personal grooming?: A Lot Help from another person toileting, which includes using toliet, bedpan,  or urinal?: Total Help from another person bathing (including washing, rinsing, drying)?: A Lot Help from another person to put on and taking off regular upper body clothing?: A Lot Help from another person to put on and taking off regular lower body clothing?: Total 6 Click Score: 10   End of Session Nurse Communication: Mobility status  Activity Tolerance: Patient limited by lethargy Patient left: in bed;with call bell/phone within reach  OT Visit Diagnosis: Other symptoms and signs involving cognitive function                Time: 8851-8794 OT Time Calculation (min): 17 min Charges:  OT General Charges $OT Visit: 1 Visit OT Evaluation $OT Eval Moderate Complexity: 1 Mod  12/29/2023  RP, OTR/L  Acute Rehabilitation Services  Office:  [phone]   Allison Hughes 12/29/2023, 1:33 PM

## 2023-12-29 NOTE — ED Notes (Signed)
 Bumped pt. Down from 4L to 3L; Sats. Are at 99%

## 2023-12-29 NOTE — Progress Notes (Signed)
 Progress Note   Patient: Allison Hughes FMW:[id number] DOB: 11/27/1950 DOA: 12/28/2023  DOS: the patient was seen and examined on 12/29/2023   Brief hospital course:  73 y.o. female with medical history significant for HTN, HLD, CVA, COPD, T2DM, HFpEF aortic mural thrombus, mild cognitive impairment, GI bleed and recurrent falls who presents to the ED for evaluation of generalized weakness, mild confusion and a fall at home.   Assessment and Plan:  Acute metabolic encephalopathy/underlying mild cognitive impairment - Lethargic and intermittently confused.  Likely in the setting of dehydration, UTI.  Continue to monitor closely.  Showing mild improvement this morning.  Delirium precautions in place.  Acute cystitis without hematuria - UA noting LE, nitrites, WBCs, bacteria.  Placed on empiric ceftriaxone , IV fluid.  Monitor urine culture.  Diabetes mellitus - Continue insulin  glargine 10 units with insulin  sliding scale.  Repeat A1c.  HFpEF - Patient appears volume depleted on exam.  Monitor respiratory function while receiving IV fluid hydration.  Hypertension - Currently stable.  COPD - Does not appear be acute exacerbation.  Nebulizers as needed.  History of mural thrombus - History of aortic mural thrombus found on PET/CT.  Continue aspirin  plus atorvastatin  as recommended by vascular surgery during previous hospitalization.  Physical debilitation muscle weakness - Multifactorial etiology with acute cystitis, encephalopathy, physical debilitation and advanced age.  Will order PT/OT.   Subjective: Patient resting this morning, somewhat lethargic but rousable and intermittently answering questions.  Denies pain, fever, chills, nausea, vomiting, shortness of breath.  Did not know she was at the hospital.  Continued to be very lethargic.  Physical Exam:  Vitals:   12/29/23 1225 12/29/23 1230 12/29/23 1245 12/29/23 1301  BP: 116/67   133/76  Pulse: 82 82 82 88  Resp: 19  20 20 20   Temp:    98.8 F (37.1 C)  TempSrc:    Oral  SpO2: 94% 92% 92% 90%  Weight:      Height:        GENERAL: Lethargic but arousable, frail HEENT:  EOMI CARDIOVASCULAR:  RRR, no murmurs appreciated RESPIRATORY:  Clear to auscultation, no wheezing, rales, or rhonchi GASTROINTESTINAL:  Soft, nontender, nondistended EXTREMITIES:  No LE edema bilaterally NEURO:  No new focal deficits appreciated SKIN:  No rashes noted PSYCH:  Appropriate mood and affect     Data Reviewed:  Imaging Studies: CT Cervical Spine Wo Contrast Result Date: 12/28/2023 EXAM: CT CERVICAL SPINE WITHOUT CONTRAST 12/28/2023 07:16:52 PM TECHNIQUE: CT of the cervical spine was performed without the administration of intravenous contrast. Multiplanar reformatted images are provided for review. Automated exposure control, iterative reconstruction, and/or weight based adjustment of the mA/kV was utilized to reduce the radiation dose to as low as reasonably achievable. COMPARISON: None available. CLINICAL HISTORY: Neck trauma (Age >= 65y) FINDINGS: BONES AND ALIGNMENT: No acute fracture or traumatic malalignment. DEGENERATIVE CHANGES: No significant degenerative changes. SOFT TISSUES: No prevertebral soft tissue swelling. IMPRESSION: 1. No significant abnormality Electronically signed by: Pinkie Pebbles MD 12/28/2023 07:27 PM EST RP Workstation: [id number]   CT Head Wo Contrast Result Date: 12/28/2023 EXAM: CT HEAD WITHOUT 12/28/2023 07:16:52 PM TECHNIQUE: CT of the head was performed without the administration of intravenous contrast. Automated exposure control, iterative reconstruction, and/or weight based adjustment of the mA/kV was utilized to reduce the radiation dose to as low as reasonably achievable. COMPARISON: 11/29/2023 CLINICAL HISTORY: Head trauma, minor (Age >= 65y) FINDINGS: BRAIN AND VENTRICLES: No acute intracranial hemorrhage. No mass effect or midline shift. No  extra-axial fluid collection.  Generalized cerebral atrophy and advanced chronic small vessel ischemic disease. 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 Bilateral basal ganglia mineralization. Intracranial arterial calcification. No hydrocephalus. ORBITS: No acute abnormality. SINUSES AND MASTOIDS: No acute abnormality. SOFT TISSUES AND SKULL: No acute skull fracture. No acute soft tissue abnormality. IMPRESSION: 1. No acute intracranial abnormality. 2. Old right parietal and left cerebellar infarcts. Electronically signed by: Pinkie Pebbles MD 12/28/2023 07:25 PM EST RP Workstation: [id number]   DG Chest Port 1 View Result Date: 12/28/2023 CLINICAL DATA:  Fall.  Generalized weakness. EXAM: PORTABLE CHEST 1 VIEW COMPARISON:  11/29/2023. FINDINGS: The heart size and mediastinal contours are within normal limits. There is atherosclerotic calcification of the aorta. The pulmonary vasculature is distended. Perihilar interstitial prominence is noted bilaterally. There is mild airspace disease at the lung bases. A loop recorder device is seen over the left chest. No consolidation, effusion, or pneumothorax. No acute osseous abnormality. IMPRESSION: 1. Mildly distended pulmonary vasculature with interstitial prominence bilaterally, possible edema or infiltrate. 2. Mild airspace disease at the lung bases, possible atelectasis or infiltrate. Electronically Signed   By: Leita Birmingham M.D.   On: 12/28/2023 19:10   CUP PACEART REMOTE DEVICE CHECK Result Date: 12/23/2023 ILR summary report received. Battery status OK. Normal device function. No new symptom, tachy, brady, or pause episodes. No new AF episodes. Monthly summary reports and ROV/PRN.  No histogram data, acceptable HR graph LA, CVRS  CT CHEST ABDOMEN PELVIS W CONTRAST Result Date: 11/29/2023 EXAM: CT CHEST, ABDOMEN AND PELVIS WITH CONTRAST 11/29/2023 02:43:03 PM TECHNIQUE: CT of the chest, abdomen and pelvis was performed with the administration of  intravenous contrast. 75 mL of iohexol  (OMNIPAQUE ) 350 MG/ML injection was administered. Multiplanar reformatted images are provided for review. Automated exposure control, iterative reconstruction, and/or weight based adjustment of the mA/kV was utilized to reduce the radiation dose to as low as reasonably achievable. COMPARISON: 09/19/2023 CLINICAL HISTORY: Polytrauma, blunt FINDINGS: CHEST: MEDIASTINUM AND LYMPH NODES: Scattered plaque through the thoracic aorta with some eccentric nonocclusive mural thrombus in the mid and distal descending segment, possible embolic risk. Heart and pericardium are unremarkable. Scattered coronary calcifications. The central airways are clear. No mediastinal, hilar or axillary lymphadenopathy. LUNGS AND PLEURA: Pulmonary ground glass opacities most conspicuous posteriorly in the lung bases but also seen in the upper lobes, left worse than right, showing some decrease since previous study. Septal thickening has significantly improved. No pleural effusion or pneumothorax. ABDOMEN AND PELVIS: LIVER: The liver is unremarkable. GALLBLADDER AND BILE DUCTS: Innumerable subcentimeter partially calcified gallstones layer in the dependent aspect of the nondilated gallbladder. No biliary ductal dilatation. SPLEEN: No acute abnormality. PANCREAS: No acute abnormality. ADRENAL GLANDS: No acute abnormality. KIDNEYS, URETERS AND BLADDER: No stones in the kidneys or ureters. No hydronephrosis. No perinephric or periureteral stranding. Urinary bladder is unremarkable. GI AND BOWEL: Stomach demonstrates no acute abnormality. There is no bowel obstruction. REPRODUCTIVE ORGANS: No acute abnormality. PERITONEUM AND RETROPERITONEUM: No ascites. No free air. VASCULATURE: Abdominal aorta is normal in caliber. ABDOMINAL AND PELVIS LYMPH NODES: No lymphadenopathy. 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 Bilateral pelvic  phleboliths. No focal soft tissue abnormality. IMPRESSION: 1. No acute findings. 2. Ground glass opacities in the lung bases and upper lobes, left worse than right, decreased from prior, with improved septal thickening. 3. Eccentric nonocclusive mural thrombus within the mid to distal descending thoracic aorta, potential embolic source; consider  vascular consultation re antithrombotic management. Electronically signed by: Katheleen Faes MD 11/29/2023 02:54 PM EST RP Workstation: [id number]   CT CERVICAL SPINE WO CONTRAST Result Date: 11/29/2023 CLINICAL DATA:  Blunt trauma EXAM: CT CERVICAL SPINE WITHOUT CONTRAST TECHNIQUE: Multidetector CT imaging of the cervical spine was performed without intravenous contrast. Multiplanar CT image reconstructions were also generated. RADIATION DOSE REDUCTION: This exam was performed according to the departmental dose-optimization program which includes automated exposure control, adjustment of the mA and/or kV according to patient size and/or use of iterative reconstruction technique. COMPARISON:  10/20/2023 FINDINGS: Alignment: Normal. Skull base and vertebrae: No acute fracture. No primary bone lesion or focal pathologic process. Soft tissues and spinal canal: No prevertebral fluid or swelling. No visible canal hematoma. Disc levels: Moderate spondylosis at C4-5 and C5-6, with stable mild central canal stenosis and bilateral right greater than left neural foraminal encroachment at those levels. Mild C6-7 spondylosis. Upper chest: Airway is patent. Emphysematous changes are seen at the lung apices. Other: Reconstructed images demonstrate no additional findings. IMPRESSION: 1. No acute cervical spine fracture. 2. Stable spondylosis and neural foraminal encroachment most pronounced at C4-5 and C5-6. 3. Pulmonary emphysema is present. Pulmonary emphysema is an independent risk factor for lung cancer. Recommend patient be considered for lung cancer screening. [place]  Scientist, Water Quality. Screening for Lung Cancer: [place]  Licensed Conveyancer. JAMA. 2021;325(10):962-970. Electronically Signed   By: Ozell Daring M.D.   On: 11/29/2023 14:51   CT HEAD WO CONTRAST Result Date: 11/29/2023 CLINICAL DATA:  Moderate to severe head trauma, fell EXAM: CT HEAD WITHOUT CONTRAST TECHNIQUE: Contiguous axial images were obtained from the base of the skull through the vertex without intravenous contrast. RADIATION DOSE REDUCTION: This exam was performed according to the departmental dose-optimization program which includes automated exposure control, adjustment of the mA and/or kV according to patient size and/or use of iterative reconstruction technique. COMPARISON:  10/20/2023 FINDINGS: Brain: Stable chronic ischemic changes throughout the periventricular and subcortical white matter as well as left cerebellar hemisphere. No evidence of acute infarct or hemorrhage. The lateral ventricles and midline structures are otherwise stable. No acute extra-axial fluid collections. No mass effect. Vascular: No hyperdense vessel or unexpected calcification. Skull: Normal. Negative for fracture or focal lesion. Sinuses/Orbits: No acute finding. Other: None. IMPRESSION: 1. No acute intracranial process. 2. Stable chronic ischemic changes as above. Electronically Signed   By: Ozell Daring M.D.   On: 11/29/2023 14:47   DG Chest Port 1 View Result Date: 11/29/2023 CLINICAL DATA:  Found on floor at home by friends. EXAM: PORTABLE CHEST 1 VIEW COMPARISON:  10/20/2023 FINDINGS: Loop recorder projects over the left heart. Lungs are somewhat hypoinflated without lobar consolidation or effusion. Stone silhouette and remainder of the exam is unchanged. IMPRESSION: Hypoinflation without acute cardiopulmonary disease. Electronically Signed   By: Toribio Agreste M.D.   On: 11/29/2023 14:10   DG Pelvis Portable Result Date: 11/29/2023 CLINICAL DATA:  Clemens, found down EXAM:  PORTABLE PELVIS 1-2 VIEWS COMPARISON:  10/20/2023 FINDINGS: Supine frontal view of the pelvis and bilateral hips demonstrates no acute displaced fracture, subluxation, or dislocation. Joint spaces are well preserved. Sacroiliac joints are normal. IMPRESSION: 1. Unremarkable pelvis and bilateral hips. Electronically Signed   By: Ozell Daring M.D.   On: 11/29/2023 14:01    There are no new results to review at this time.  Previous records (including but not limited to H&P, progress notes, nursing notes, TOC management) were reviewed in assessment of  this patient.  Labs: CBC: Recent Labs  Lab 12/28/23 1815 12/29/23 0424  WBC 10.6* 8.6  NEUTROABS 8.2*  --   HGB 11.7* 11.0*  HCT 38.1 35.2*  MCV 79.5* 80.7  PLT 351 333   Basic Metabolic Panel: Recent Labs  Lab 12/28/23 1815 12/29/23 0424  NA 143 141  K 3.6 3.5  CL 111 108  CO2 25 26  GLUCOSE 213* 132*  BUN 14 13  CREATININE 0.57 0.49  CALCIUM  8.9 8.8*   Liver Function Tests: Recent Labs  Lab 12/28/23 1815  AST 15  ALT 13  ALKPHOS 123  BILITOT 0.5  PROT 6.0*  ALBUMIN 2.8*   CBG: Recent Labs  Lab 12/28/23 2210 12/29/23 0830 12/29/23 1252  GLUCAP 154* 140* 171*    Scheduled Meds:  aspirin  EC  81 mg Oral Daily   atorvastatin   40 mg Oral Daily   enoxaparin  (LOVENOX ) injection  40 mg Subcutaneous QHS   insulin  aspart  0-15 Units Subcutaneous TID WC   insulin  aspart  0-5 Units Subcutaneous QHS   insulin  glargine  10 Units Subcutaneous QHS   lisinopril   30 mg Oral QHS   pantoprazole   40 mg Oral Daily   Continuous Infusions:  cefTRIAXone  (ROCEPHIN )  IV     lactated ringers  100 mL/hr at 12/29/23 0836   PRN Meds:.acetaminophen  **OR** acetaminophen , bisacodyl , ipratropium-albuterol , ondansetron  **OR** ondansetron  (ZOFRAN ) IV, senna-docusate  Family Communication: Not at bedside  Disposition: Status is: Inpatient Remains inpatient appropriate because: Acute encephalopathy, UTI     Time spent: 41  minutes  Length of inpatient stay: 1 days  Author: Carliss LELON Canales, DO 12/29/2023 1:06 PM  For on call review [email or web address].

## 2023-12-29 NOTE — ED Notes (Signed)
 CCMD called; pt. Put back on monitor

## 2023-12-29 NOTE — ED Notes (Signed)
 Pt. Constantly yelling at this RN; complaining that we are holding her hostage; MD made aware; orders made

## 2023-12-29 NOTE — ED Notes (Signed)
 Pt refuses to keep Allison Hughes in his nose.  He is 100% on RA but tachypneic.

## 2023-12-29 NOTE — Evaluation (Signed)
 Physical Therapy Evaluation Patient Details Name: Allison Hughes MRN: [id number] DOB: [birth year] Today's Date: 12/29/2023  History of Present Illness  73 yo F adm 12/14 fall earlier in the day and found on couch by friend. Pt found to have UTI. PMH 11/15 fall, 10/8-10/26 fall, 9/5-9/10 fall, HTN HLD CVA COPD DM2 GIB, Obesity admission to [facility] 08/2023, aphasia  Clinical Impression  Pt with frequent falls at home and frequent hospital admissions and SNF stays. Currently pt lethargic and assist to even sit up. Patient will benefit from continued inpatient follow up therapy, <3 hours/day. Likely should look at ALF type facility after she is done with rehab.         If plan is discharge home, recommend the following: Two people to help with walking and/or transfers;A lot of help with bathing/dressing/bathroom;Direct supervision/assist for medications management;Assistance with cooking/housework;Help with stairs or ramp for entrance;Assist for transportation   Can travel by private vehicle   No    Equipment Recommendations Wheelchair cushion (measurements PT);Wheelchair (measurements PT)  Recommendations for Other Services       Functional Status Assessment Patient has had a recent decline in their functional status and/or demonstrates limited ability to make significant improvements in function in a reasonable and predictable amount of time     Precautions / Restrictions Precautions Precautions: Fall Recall of Precautions/Restrictions: Impaired Restrictions Weight Bearing Restrictions Per Provider Order: No      Mobility  Bed Mobility Overal bed mobility: Needs Assistance Bed Mobility: Supine to Sit, Sit to Supine     Supine to sit: Max assist Sit to supine: Max assist   General bed mobility comments: Assist for all aspects    Transfers                   General transfer comment: Unable to attempt due to lethargy and weakness    Ambulation/Gait                   Stairs            Wheelchair Mobility     Tilt Bed    Modified Rankin (Stroke Patients Only)       Balance Overall balance assessment: Needs assistance Sitting-balance support: Feet unsupported, Bilateral upper extremity supported Sitting balance-Leahy Scale: Poor Sitting balance - Comments: Mod assist for static sitting Postural control: Posterior lean, Left lateral lean                                   Pertinent Vitals/Pain Pain Assessment Pain Assessment: Faces Faces Pain Scale: No hurt    Home Living Family/patient expects to be discharged to:: Private residence Living Arrangements: Alone Available Help at Discharge: Available PRN/intermittently;Friend(s) Type of Home: House Home Access: Stairs to enter Entrance Stairs-Rails: Right;Left;Can reach both Entrance Stairs-Number of Steps: 3   Home Layout: One level Home Equipment: Agricultural Consultant (2 wheels);Cane - single point;Shower seat;BSC/3in1 Additional Comments: reports friend phil helps her out at home daily.  All information above taken from prior hospitalization.  Patient unable to answer any questions. 12/15    Prior Function Prior Level of Function : Needs assist               ADLs Comments: Per prior admit, she requires help from friend (Mr Adin) for showering, cooking, and dressing.     Extremity/Trunk Assessment   Upper Extremity Assessment Upper Extremity Assessment: Defer to OT evaluation  Lower Extremity Assessment Lower Extremity Assessment: Generalized weakness    Cervical / Trunk Assessment Cervical / Trunk Assessment: Kyphotic  Communication   Communication Communication: Impaired Factors Affecting Communication: Hearing impaired;Reduced clarity of speech    Cognition Arousal: Lethargic Behavior During Therapy: Flat affect   PT - Cognitive impairments: Safety/Judgement, Problem solving, Attention, Awareness                        PT - Cognition Comments: Able to arouse with stimulation. Slow to process. Following commands: Impaired Following commands impaired: Follows one step commands inconsistently     Cueing Cueing Techniques: Verbal cues, Gestural cues, Tactile cues     General Comments      Exercises     Assessment/Plan    PT Assessment Patient needs continued PT services  PT Problem List Decreased strength;Decreased activity tolerance;Decreased mobility;Decreased cognition;Decreased safety awareness;Decreased balance;Decreased knowledge of use of DME;Decreased knowledge of precautions       PT Treatment Interventions DME instruction;Gait training;Functional mobility training;Therapeutic exercise;Patient/family education;Balance training;Therapeutic activities    PT Goals (Current goals can be found in the Care Plan section)  Acute Rehab PT Goals Patient Stated Goal: not stated PT Goal Formulation: Patient unable to participate in goal setting Time For Goal Achievement: 01/12/24 Potential to Achieve Goals: Fair    Frequency Min 2X/week     Co-evaluation               AM-PAC PT 6 Clicks Mobility  Outcome Measure Help needed turning from your back to your side while in a flat bed without using bedrails?: A Lot Help needed moving from lying on your back to sitting on the side of a flat bed without using bedrails?: A Lot Help needed moving to and from a bed to a chair (including a wheelchair)?: Total Help needed standing up from a chair using your arms (e.g., wheelchair or bedside chair)?: Total Help needed to walk in hospital room?: Total Help needed climbing 3-5 steps with a railing? : Total 6 Click Score: 8    End of Session   Activity Tolerance: Patient limited by lethargy Patient left: in bed;with call bell/phone within reach   PT Visit Diagnosis: Unsteadiness on feet (R26.81);Other abnormalities of gait and mobility (R26.89);Repeated falls (R29.6);Muscle weakness  (generalized) (M62.81)    Time: 8870-8856 PT Time Calculation (min) (ACUTE ONLY): 14 min   Charges:   PT Evaluation $PT Eval Moderate Complexity: 1 Mod   PT General Charges $$ ACUTE PT VISIT: 1 Visit         [facility] Acute Rehabilitation Services Office [phone]   [facility] 12/29/2023, 2:32 PM

## 2023-12-29 NOTE — Hospital Course (Signed)
 73 y.o. female with medical history significant for HTN, HLD, CVA, COPD, T2DM, HFpEF aortic mural thrombus, mild cognitive impairment, GI bleed and recurrent falls who presents to the ED for evaluation of generalized weakness, mild confusion and a fall at home.    Assessment and Plan:   Acute metabolic encephalopathy/underlying mild cognitive impairment - Lethargic and intermittently confused.  Likely in the setting of dehydration, UTI.  Continue to monitor closely.  Showing mild improvement this morning.  Delirium precautions in place.   Acute cystitis without hematuria - UA noting LE, nitrites, WBCs, bacteria.  Placed on empiric ceftriaxone , IV fluid.  Monitor urine culture.   Diabetes mellitus - Continue insulin  glargine 10 units with insulin  sliding scale.  Repeat A1c.   HFpEF - Patient appears volume depleted on exam.  Monitor respiratory function while receiving IV fluid hydration.   Hypertension - Currently stable.   COPD - Does not appear be acute exacerbation.  Nebulizers as needed.   History of mural thrombus - History of aortic mural thrombus found on PET/CT.  Continue aspirin  plus atorvastatin  as recommended by vascular surgery during previous hospitalization.   Physical debilitation muscle weakness - Multifactorial etiology with acute cystitis, encephalopathy, physical debilitation and advanced age.  Will order PT/OT.

## 2023-12-30 ENCOUNTER — Encounter (HOSPITAL_COMMUNITY): Payer: Self-pay | Admitting: Student

## 2023-12-30 ENCOUNTER — Other Ambulatory Visit: Payer: Self-pay

## 2023-12-30 LAB — BASIC METABOLIC PANEL WITH GFR
Anion gap: 6 (ref 5–15)
BUN: 8 mg/dL (ref 8–23)
CO2: 27 mmol/L (ref 22–32)
Calcium: 8.7 mg/dL — ABNORMAL LOW (ref 8.9–10.3)
Chloride: 110 mmol/L (ref 98–111)
Creatinine, Ser: 0.49 mg/dL (ref 0.44–1.00)
GFR, Estimated: >60 mL/min (ref >60)
Glucose, Bld: 100 mg/dL — ABNORMAL HIGH (ref 70–99)
Potassium: 3.2 mmol/L — ABNORMAL LOW (ref 3.5–5.1)
Sodium: 143 mmol/L (ref 135–145)

## 2023-12-30 LAB — URINE CULTURE: Culture: >=100000 — AB

## 2023-12-30 LAB — GLUCOSE, CAPILLARY
Glucose-Capillary: 129 mg/dL — ABNORMAL HIGH (ref 70–99)
Glucose-Capillary: 147 mg/dL — ABNORMAL HIGH (ref 70–99)
Glucose-Capillary: 117 mg/dL — ABNORMAL HIGH (ref 70–99)
Glucose-Capillary: 206 mg/dL — ABNORMAL HIGH (ref 70–99)

## 2023-12-30 LAB — CBC
HCT: 35.2 % — ABNORMAL LOW (ref 36.0–46.0)
Hemoglobin: 10.9 g/dL — ABNORMAL LOW (ref 12.0–15.0)
MCH: 24.4 pg — ABNORMAL LOW (ref 26.0–34.0)
MCHC: 31 g/dL (ref 30.0–36.0)
MCV: 78.9 fL — ABNORMAL LOW (ref 80.0–100.0)
Platelets: 360 K/uL (ref 150–400)
RBC: 4.46 MIL/uL (ref 3.87–5.11)
RDW: 16.4 % — ABNORMAL HIGH (ref 11.5–15.5)
WBC: 6.4 K/uL (ref 4.0–10.5)
nRBC: 0 % (ref 0.0–0.2)

## 2023-12-30 MED ORDER — POTASSIUM CHLORIDE IN NACL 20-0.9 MEQ/L-% IV SOLN
INTRAVENOUS | Status: DC
  Filled 2023-12-30 (×2): qty 1000

## 2023-12-30 MED ORDER — POTASSIUM CHLORIDE 20 MEQ PO PACK
40.0000 meq | PACK | Freq: Two times a day (BID) | ORAL | Status: AC
  Administered 2023-12-30 (×2): 40 meq via ORAL
  Filled 2023-12-30 (×2): qty 2

## 2023-12-30 MED ORDER — SULFAMETHOXAZOLE-TRIMETHOPRIM 800-160 MG PO TABS
1.0000 | ORAL_TABLET | Freq: Two times a day (BID) | ORAL | Status: DC
  Administered 2023-12-30 – 2024-01-01 (×5): 1 via ORAL
  Filled 2023-12-30 (×6): qty 1

## 2023-12-30 NOTE — Plan of Care (Signed)
 Problem: Education: Goal: Ability to describe self-care measures that may prevent or decrease complications (Diabetes Survival Skills Education) will improve 12/30/2023 1344 by Franchot Rosaria LABOR, RN Outcome: Progressing 12/30/2023 1344 by Franchot Rosaria LABOR, RN Outcome: Progressing Goal: Individualized Educational Video(s) 12/30/2023 1344 by Franchot Rosaria LABOR, RN Outcome: Progressing 12/30/2023 1344 by Franchot Rosaria LABOR, RN Outcome: Progressing   Problem: Coping: Goal: Ability to adjust to condition or change in health will improve 12/30/2023 1344 by Franchot Rosaria LABOR, RN Outcome: Progressing 12/30/2023 1344 by Franchot Rosaria LABOR, RN Outcome: Progressing   Problem: Fluid Volume: Goal: Ability to maintain a balanced intake and output will improve 12/30/2023 1344 by Franchot Rosaria LABOR, RN Outcome: Progressing 12/30/2023 1344 by Franchot Rosaria LABOR, RN Outcome: Progressing   Problem: Health Behavior/Discharge Planning: Goal: Ability to identify and utilize available resources and services will improve 12/30/2023 1344 by Franchot Rosaria LABOR, RN Outcome: Progressing 12/30/2023 1344 by Franchot Rosaria LABOR, RN Outcome: Progressing Goal: Ability to manage health-related needs will improve 12/30/2023 1344 by Franchot Rosaria LABOR, RN Outcome: Progressing 12/30/2023 1344 by Franchot Rosaria LABOR, RN Outcome: Progressing   Problem: Metabolic: Goal: Ability to maintain appropriate glucose levels will improve 12/30/2023 1344 by Franchot Rosaria LABOR, RN Outcome: Progressing 12/30/2023 1344 by Franchot Rosaria LABOR, RN Outcome: Progressing   Problem: Nutritional: Goal: Maintenance of adequate nutrition will improve 12/30/2023 1344 by Franchot Rosaria LABOR, RN Outcome: Progressing 12/30/2023 1344 by Franchot Rosaria LABOR, RN Outcome: Progressing Goal: Progress toward achieving an optimal weight will improve 12/30/2023 1344 by Franchot Rosaria LABOR, RN Outcome:  Progressing 12/30/2023 1344 by Franchot Rosaria LABOR, RN Outcome: Progressing   Problem: Skin Integrity: Goal: Risk for impaired skin integrity will decrease 12/30/2023 1344 by Franchot Rosaria LABOR, RN Outcome: Progressing 12/30/2023 1344 by Franchot Rosaria LABOR, RN Outcome: Progressing   Problem: Tissue Perfusion: Goal: Adequacy of tissue perfusion will improve 12/30/2023 1344 by Franchot Rosaria LABOR, RN Outcome: Progressing 12/30/2023 1344 by Franchot Rosaria LABOR, RN Outcome: Progressing   Problem: Education: Goal: Knowledge of General Education information will improve Description: Including pain rating scale, medication(s)/side effects and non-pharmacologic comfort measures 12/30/2023 1344 by Franchot Rosaria LABOR, RN Outcome: Progressing 12/30/2023 1344 by Franchot Rosaria LABOR, RN Outcome: Progressing   Problem: Health Behavior/Discharge Planning: Goal: Ability to manage health-related needs will improve 12/30/2023 1344 by Franchot Rosaria LABOR, RN Outcome: Progressing 12/30/2023 1344 by Franchot Rosaria LABOR, RN Outcome: Progressing   Problem: Clinical Measurements: Goal: Ability to maintain clinical measurements within normal limits will improve 12/30/2023 1344 by Franchot Rosaria LABOR, RN Outcome: Progressing 12/30/2023 1344 by Franchot Rosaria LABOR, RN Outcome: Progressing Goal: Will remain free from infection 12/30/2023 1344 by Franchot Rosaria LABOR, RN Outcome: Progressing 12/30/2023 1344 by Franchot Rosaria LABOR, RN Outcome: Progressing Goal: Diagnostic test results will improve 12/30/2023 1344 by Franchot Rosaria LABOR, RN Outcome: Progressing 12/30/2023 1344 by Franchot Rosaria LABOR, RN Outcome: Progressing Goal: Respiratory complications will improve 12/30/2023 1344 by Franchot Rosaria LABOR, RN Outcome: Progressing 12/30/2023 1344 by Franchot Rosaria LABOR, RN Outcome: Progressing Goal: Cardiovascular complication will be avoided 12/30/2023 1344 by Franchot Rosaria LABOR,  RN Outcome: Progressing 12/30/2023 1344 by Franchot Rosaria LABOR, RN Outcome: Progressing   Problem: Activity: Goal: Risk for activity intolerance will decrease 12/30/2023 1344 by Franchot Rosaria LABOR, RN Outcome: Progressing 12/30/2023 1344 by Franchot Rosaria LABOR, RN Outcome: Progressing   Problem: Nutrition: Goal: Adequate nutrition will be maintained 12/30/2023 1344 by Franchot Rosaria LABOR, RN Outcome: Progressing 12/30/2023 1344 by Franchot Rosaria LABOR, RN Outcome: Progressing  Problem: Coping: Goal: Level of anxiety will decrease 12/30/2023 1344 by Franchot Rosaria LABOR, RN Outcome: Progressing 12/30/2023 1344 by Franchot Rosaria LABOR, RN Outcome: Progressing   Problem: Elimination: Goal: Will not experience complications related to bowel motility 12/30/2023 1344 by Franchot Rosaria LABOR, RN Outcome: Progressing 12/30/2023 1344 by Franchot Rosaria LABOR, RN Outcome: Progressing Goal: Will not experience complications related to urinary retention 12/30/2023 1344 by Franchot Rosaria LABOR, RN Outcome: Progressing 12/30/2023 1344 by Franchot Rosaria LABOR, RN Outcome: Progressing   Problem: Pain Managment: Goal: General experience of comfort will improve and/or be controlled 12/30/2023 1344 by Franchot Rosaria LABOR, RN Outcome: Progressing 12/30/2023 1344 by Franchot Rosaria LABOR, RN Outcome: Progressing   Problem: Safety: Goal: Ability to remain free from injury will improve 12/30/2023 1344 by Franchot Rosaria LABOR, RN Outcome: Progressing 12/30/2023 1344 by Franchot Rosaria LABOR, RN Outcome: Progressing   Problem: Skin Integrity: Goal: Risk for impaired skin integrity will decrease 12/30/2023 1344 by Franchot Rosaria LABOR, RN Outcome: Progressing 12/30/2023 1344 by Franchot Rosaria LABOR, RN Outcome: Progressing

## 2023-12-30 NOTE — Progress Notes (Signed)
°  Progress Note   Patient: Allison Hughes FMW:[id number] DOB: 02-15-1950 DOA: 12/28/2023     2 DOS: the patient was seen and examined on 12/30/2023 at 10:31AM      Brief hospital course: 73 y.o. F with hx dementia, lives at home alone, HTN, hx stroke, COPD not on home O2, T2DM, dCHF, and aortic mural thrombus and hx GI bleed who presented with generalized weakness, and confusion.  Found to have UTI.     Assessment and Plan: Acute metabolic encephalopathy Dehydration - IV fluids - Treat UTI - Delirium precautions -Hold Xanax  and quetiapine    Citrobacter UTI Rocephin  resistant - Changed to Bactrim   History of dementia - Hold Seroquel   Type 2 diabetes with hyperglycemia Glucose labile, improved this morning - Continue glargine - Continue sliding scale corrections - Hold metformin   Chronic diastolic congestive heart failure Hypertension Blood pressure normal.  Appears dehydrated.  Not on diuretic at baseline. - Continue lisinopril   COPD -Resume home Trelegy  History of mural thrombus On aspirin  only - Continue aspirin   Hypokalemia -Supplement potassium  Anemia Hemoglobin stable, no clinical bleeding       Subjective: She remains encephalopathic.  Nursing report no new concerns.  No fever.  No chest pain, no respiratory distress, no vomiting.     Physical Exam: BP 137/65 (BP Location: Right Arm)   Pulse 86   Temp 97.9 F (36.6 C)   Resp 18   Ht 5' 7 (1.702 m)   Wt 68 kg   SpO2 95%   BMI 23.48 kg/m   Elderly adult female, lying in bed, sleepy, arouses sluggishly RRR, no murmurs, no peripheral edema Respiratory rate normal, lungs clear without rales or wheezes Abdomen soft no tenderness palpation or guarding, no ascites or distention Attention diminished, affect blunted, judgment and insight appear impaired, face symmetric, oriented to self and hospital only, severe generalized weakness    Data Reviewed: Basic metabolic panel shows  hypokalemia CBC shows no leukocytosis, only mild anemia Urine culture growing Citrobacter CT head normal     Family Communication: friend at the bedside, no family contacts listed in chart    Disposition: Status is: Inpatient         Author: Lonni SHAUNNA Dalton, MD 12/30/2023 1:08 PM  For on call review [email or web address].

## 2023-12-30 NOTE — Progress Notes (Signed)
 Remote Loop Recorder Transmission

## 2023-12-30 NOTE — Progress Notes (Addendum)
 CSW spoke to the pt at bed side and introduced self and role.  CSW asked if the pt would be willing to do STR at a SNF and pt was agreeable. Pt gave CSW verbal permission to contact friend Manus.  CSW attempted to complete initial assessment, but the pt was nodding off during questions. Pt stated they had their meds this morning and that they aren't quite with it yet. CSW stated they could come back later.  Referrals sent in the HUB bed offers pending.  CSW will continue to follow.

## 2023-12-30 NOTE — NC FL2 (Signed)
 [place]  MEDICAID FL2 LEVEL OF CARE FORM     IDENTIFICATION  Patient Name: Allison Hughes Birthdate: December 30, 1950 Sex: female Admission Date (Current Location): 12/28/2023  [facility] and [place] Number:  Producer, Television/film/video and Address:  [facility] [place], [address], [place], [place] [address]      Provider Number: [id number]  Attending Physician Name and Address:  Jonel Lonni SQUIBB, *  Relative Name and Phone Number:  Adin Manus Benne, Emergency Contact  [phone]    Current Level of Care: Hospital Recommended Level of Care: Skilled Nursing Facility Prior Approval Number:    Date Approved/Denied:   PASRR Number: [id number]  Discharge Plan:      Current Diagnoses: Patient Active Problem List   Diagnosis Date Noted   Acute cystitis with hematuria 12/29/2023   Generalized weakness 12/29/2023   Failure to thrive in adult 12/29/2023   Recurrent falls 12/29/2023   Chronic obstructive pulmonary disease (HCC) 12/29/2023   Acute metabolic encephalopathy 12/28/2023   Acute encephalopathy 11/29/2023   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   GERD (gastroesophageal reflux disease) 11/29/2023   Chronic heart failure with preserved ejection fraction (HFpEF) (HCC) 10/20/2023   Fall at home, initial encounter 10/20/2023   Hyperlipidemia 10/20/2023   History of gastric ulcer 10/20/2023   ABLA (acute blood loss anemia) 09/19/2023   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   Neurocognitive disorder 08/31/2023   Impaired mobility and ADLs 08/20/2023   Anemia 09/02/2022   Aphasia 06/03/2022   History of stroke 09/06/2020   DM (diabetes mellitus), type 2 (HCC) 11/20/2018   Hypokalemia 02/02/2016   Anxiety 02/02/2016   Essential hypertension 02/02/2016    Orientation RESPIRATION BLADDER Height & Weight     Self, Place  Normal  (no data input) Weight: 149 lb 14.6 oz (68 kg) Height:  5' 7 (170.2 cm)  BEHAVIORAL SYMPTOMS/MOOD  NEUROLOGICAL BOWEL NUTRITION STATUS       (no data input) Diet (see dc summary)  AMBULATORY STATUS COMMUNICATION OF NEEDS Skin   Extensive Assist Verbally Other (Comment) (erythema)                       Personal Care Assistance Level of Assistance  Bathing, Dressing, Feeding Bathing Assistance: Maximum assistance (total assist) Feeding assistance: Maximum assistance Dressing Assistance: Maximum assistance (total assist)     Functional Limitations Info  Hearing, Sight, Speech Sight Info: Adequate Hearing Info: Adequate Speech Info: Adequate    SPECIAL CARE FACTORS FREQUENCY  PT (By licensed PT), OT (By licensed OT)     PT Frequency: 5x/wk OT Frequency: 5x/wk            Contractures Contractures Info: Not present    Additional Factors Info  Code Status, Allergies, Insulin  Sliding Scale Code Status Info: FULL Allergies Info: Iodine  Shrimp (Shellfish Allergy)   Insulin  Sliding Scale Info: Novolog  and Lantus        Current Medications (12/30/2023):  This is the current hospital active medication list Current Facility-Administered Medications  Medication Dose Route Frequency Provider Last Rate Last Admin   acetaminophen  (TYLENOL ) tablet 650 mg  650 mg Oral Q6H PRN Amponsah, Prosper M, MD       Or   acetaminophen  (TYLENOL ) suppository 650 mg  650 mg Rectal Q6H PRN Lou Claretta HERO, MD       aspirin  EC tablet 81 mg  81 mg Oral Daily Amponsah, Prosper M, MD   81 mg at 12/30/23 0831   atorvastatin  (  LIPITOR) tablet 40 mg  40 mg Oral Daily Amponsah, Prosper M, MD   40 mg at 12/30/23 0831   bisacodyl  (DULCOLAX) EC tablet 5 mg  5 mg Oral Daily PRN Amponsah, Prosper M, MD       enoxaparin  (LOVENOX ) injection 40 mg  40 mg Subcutaneous QHS Amponsah, Prosper M, MD   40 mg at 12/29/23 2219   haloperidol  lactate (HALDOL ) injection 1 mg  1 mg Intravenous Q6H PRN Arlon Carliss ORN, DO   1 mg at 12/29/23 1544   insulin  aspart (novoLOG ) injection 0-15 Units  0-15 Units  Subcutaneous TID WC Amponsah, Prosper M, MD   2 Units at 12/30/23 9167   insulin  aspart (novoLOG ) injection 0-5 Units  0-5 Units Subcutaneous QHS Amponsah, Prosper M, MD   3 Units at 12/29/23 2220   insulin  glargine (LANTUS ) injection 10 Units  10 Units Subcutaneous QHS Amponsah, Prosper M, MD   10 Units at 12/29/23 2221   ipratropium-albuterol  (DUONEB) 0.5-2.5 (3) MG/3ML nebulizer solution 3 mL  3 mL Nebulization Q6H PRN Lou Claretta HERO, MD       lisinopril  (ZESTRIL ) tablet 30 mg  30 mg Oral QHS Amponsah, Prosper M, MD   30 mg at 12/29/23 2219   ondansetron  (ZOFRAN ) tablet 4 mg  4 mg Oral Q6H PRN Amponsah, Prosper M, MD       Or   ondansetron  (ZOFRAN ) injection 4 mg  4 mg Intravenous Q6H PRN Amponsah, Prosper M, MD       potassium chloride  (KLOR-CON ) packet 40 mEq  40 mEq Oral BID Jonel Lonni SQUIBB, MD   40 mEq at 12/30/23 0831   senna-docusate (Senokot-S) tablet 1 tablet  1 tablet Oral QHS PRN Lou Claretta HERO, MD       sulfamethoxazole -trimethoprim  (BACTRIM  DS) 800-160 MG per tablet 1 tablet  1 tablet Oral Q12H Danford, Lonni SQUIBB, MD   1 tablet at 12/30/23 9160     Discharge Medications: Please see discharge summary for a list of discharge medications.  Relevant Imaging Results:  Relevant Lab Results:   Additional Information SSN: [id number] LCSWA

## 2023-12-30 NOTE — Hospital Course (Signed)
 73 y.o. F with hx dementia, lives at home alone, HTN, hx stroke, COPD not on home O2, T2DM, dCHF, and aortic mural thrombus and hx GI bleed who presented with generalized weakness, and confusion.  Found to have UTI.

## 2023-12-31 DIAGNOSIS — G9341 Metabolic encephalopathy: Secondary | ICD-10-CM | POA: Diagnosis not present

## 2023-12-31 LAB — BASIC METABOLIC PANEL WITH GFR
Anion gap: 8 (ref 5–15)
BUN: 9 mg/dL (ref 8–23)
CO2: 23 mmol/L (ref 22–32)
Calcium: 9.4 mg/dL (ref 8.9–10.3)
Chloride: 111 mmol/L (ref 98–111)
Creatinine, Ser: 0.53 mg/dL (ref 0.44–1.00)
GFR, Estimated: >60 mL/min (ref >60)
Glucose, Bld: 113 mg/dL — ABNORMAL HIGH (ref 70–99)
Potassium: 4.4 mmol/L (ref 3.5–5.1)
Sodium: 142 mmol/L (ref 135–145)

## 2023-12-31 LAB — CBC
HCT: 33.4 % — ABNORMAL LOW (ref 36.0–46.0)
Hemoglobin: 10.7 g/dL — ABNORMAL LOW (ref 12.0–15.0)
MCH: 25.2 pg — ABNORMAL LOW (ref 26.0–34.0)
MCHC: 32 g/dL (ref 30.0–36.0)
MCV: 78.6 fL — ABNORMAL LOW (ref 80.0–100.0)
Platelets: 334 K/uL (ref 150–400)
RBC: 4.25 MIL/uL (ref 3.87–5.11)
RDW: 16.1 % — ABNORMAL HIGH (ref 11.5–15.5)
WBC: 6 K/uL (ref 4.0–10.5)
nRBC: 0 % (ref 0.0–0.2)

## 2023-12-31 LAB — GLUCOSE, CAPILLARY
Glucose-Capillary: 148 mg/dL — ABNORMAL HIGH (ref 70–99)
Glucose-Capillary: 169 mg/dL — ABNORMAL HIGH (ref 70–99)
Glucose-Capillary: 168 mg/dL — ABNORMAL HIGH (ref 70–99)
Glucose-Capillary: 165 mg/dL — ABNORMAL HIGH (ref 70–99)

## 2023-12-31 MED ORDER — BUDESON-GLYCOPYRROL-FORMOTEROL 160-9-4.8 MCG/ACT IN AERO
2.0000 | INHALATION_SPRAY | Freq: Two times a day (BID) | RESPIRATORY_TRACT | Status: DC
  Filled 2023-12-31: qty 5.9

## 2023-12-31 NOTE — Progress Notes (Signed)
 Physical Therapy Treatment Patient Details Name: Allison Hughes MRN: [id number] DOB: April 18, 1950 Today's Date: 12/31/2023   History of Present Illness 73 yo F adm 12/14 fall earlier in the day and found on couch by friend. Pt found to have UTI. PMH 11/15 fall, 10/8-10/26 fall, 9/5-9/10 fall, HTN HLD CVA COPD DM2 GIB, Obesity [facility], aphasia    PT Comments  Pt is progressing well towards goals. Currently pt is CGA for bed mobility, Min A for sit to stand and Min to Mod A for gait with +2 chair follow due to impulsivity, buckling at the R knee and fatigue. Pt is a high risk for falls. Due to pt current functional status, home set up and available assistance at home recommending skilled physical therapy services < 3 hours/day in order to address strength, balance and functional mobility to decrease risk for falls, injury, immobility, skin break down and re-hospitalization.     If plan is discharge home, recommend the following: Assistance with cooking/housework;Help with stairs or ramp for entrance;Assist for transportation;A lot of help with walking and/or transfers;Supervision due to cognitive status   Can travel by private vehicle     No  Equipment Recommendations  BSC/3in1;Rolling walker (2 wheels)       Precautions / Restrictions Precautions Precautions: Fall Recall of Precautions/Restrictions: Impaired Restrictions Weight Bearing Restrictions Per Provider Order: No     Mobility  Bed Mobility Overal bed mobility: Needs Assistance Bed Mobility: Supine to Sit     Supine to sit: Contact guard     General bed mobility comments: in recliner at end of session    Transfers Overall transfer level: Needs assistance Equipment used: Rolling walker (2 wheels) Transfers: Sit to/from Stand Sit to Stand: Min assist           General transfer comment: Min A for sit to stand with heavy multi modal cues for safe hand placement.     Ambulation/Gait Ambulation/Gait assistance: Min assist, Mod assist, +2 safety/equipment, +2 physical assistance Gait Distance (Feet): 100 Feet Assistive device: Rolling walker (2 wheels) Gait Pattern/deviations: Drifts right/left, Step-through pattern, Knees buckling, Decreased weight shift to left, Decreased stride length, Decreased dorsiflexion - right Gait velocity: decreased Gait velocity interpretation: <1.31 ft/sec, indicative of household ambulator   General Gait Details: intermittent buckling at the R knee, pt leaning heavily toward the R in standing and then with gait throughout with Mod A to maintain AD on the ground. Slightly improved with distance. +2 chair follow for safety.      Balance Overall balance assessment: Needs assistance Sitting-balance support: Feet unsupported, Bilateral upper extremity supported Sitting balance-Leahy Scale: Fair   Postural control: Right lateral lean Standing balance support: Bilateral upper extremity supported, During functional activity, Reliant on assistive device for balance Standing balance-Leahy Scale: Poor Standing balance comment: MIn A to Mod A for balance.      Communication Communication Communication: Impaired Factors Affecting Communication: Hearing impaired  Cognition Arousal: Alert Behavior During Therapy: Impulsive   PT - Cognitive impairments: Safety/Judgement, Problem solving, Attention, Awareness     Following commands: Impaired Following commands impaired: Follows one step commands with increased time, Follows multi-step commands inconsistently, Follows one step commands inconsistently    Cueing Cueing Techniques: Verbal cues, Gestural cues, Tactile cues         Pertinent Vitals/Pain Pain Assessment Pain Assessment: No/denies pain     PT Goals (current goals can now be found in the care plan section) Acute Rehab PT Goals Patient Stated  Goal: not stated PT Goal Formulation: Patient unable to participate  in goal setting Time For Goal Achievement: 01/12/24 Potential to Achieve Goals: Fair Progress towards PT goals: Progressing toward goals    Frequency    Min 2X/week      PT Plan  Continue with current POC        AM-PAC PT 6 Clicks Mobility   Outcome Measure  Help needed turning from your back to your side while in a flat bed without using bedrails?: A Little Help needed moving from lying on your back to sitting on the side of a flat bed without using bedrails?: A Little Help needed moving to and from a bed to a chair (including a wheelchair)?: A Little Help needed standing up from a chair using your arms (e.g., wheelchair or bedside chair)?: A Little Help needed to walk in hospital room?: A Lot Help needed climbing 3-5 steps with a railing? : Total 6 Click Score: 15    End of Session Equipment Utilized During Treatment: Gait belt Activity Tolerance: Patient tolerated treatment well Patient left: in chair;with call bell/phone within reach;with chair alarm set Nurse Communication: Mobility status PT Visit Diagnosis: Unsteadiness on feet (R26.81);Other abnormalities of gait and mobility (R26.89);Repeated falls (R29.6);Muscle weakness (generalized) (M62.81)     Time: 8589-8571 PT Time Calculation (min) (ACUTE ONLY): 18 min  Charges:    $Therapeutic Activity: 8-22 mins PT General Charges $$ ACUTE PT VISIT: 1 Visit                     Dorothyann Maier, DPT, CLT  Acute Rehabilitation Services Office: [phone] (Secure chat preferred)    Dorothyann VEAR Maier 12/31/2023, 2:37 PM

## 2023-12-31 NOTE — Care Management Important Message (Signed)
 Important Message  Patient Details  Name: Allison Hughes MRN: [id number] Date of Birth: 03-May-1950   Important Message Given:  Yes - Medicare IM     [name] 12/31/2023, 3:39 PM

## 2023-12-31 NOTE — Progress Notes (Signed)
°  Progress Note   Patient: Allison Hughes FMW:[id number] DOB: 05-01-1950 DOA: 12/28/2023     3 DOS: the patient was seen and examined on 12/31/2023 at 10:30AM      Brief hospital course: 73 y.o. F with hx dementia, lives at home alone, HTN, hx stroke, COPD not on home O2, T2DM, dCHF, and aortic mural thrombus and hx GI bleed who presented with generalized weakness, and confusion.  Found to have UTI.     Assessment and Plan: Acute metabolic encephalopathy Dehydration Due to UTI and dehydration, this appears to be improving, and is likely superimposed on some Xanax , quetiapine , and dementia - Treat UTI - Stop IV fluids - Hold Xanax  and quetiapine      Citrobacter UTI Rocephin  resistant.  Urine proving - Continue Bactrim , day 2 of 5   History of dementia -Hold quetiapine    Type 2 diabetes with hyperglycemia Glucose stabilized - Continue glargine, sliding scale corrections - Continue Lipitor - Hold metformin    Chronic diastolic congestive heart failure Hypertension Blood pressure trending up Not on diuretic at baseline - Continue lisinopril    COPD -Continue Trelegy   History of mural thrombus On aspirin  only -Continue aspirin    Hypokalemia Supplemented and resolved  Anemia Hemoglobin stable             Subjective: Patient is doing well, she is asking for coffee.  She has no headache, chest pain.  She is overall weak and tired.     Physical Exam: BP (!) 145/70 (BP Location: Right Arm)   Pulse 88   Temp 98.2 F (36.8 C) (Oral)   Resp 18   Ht 5' 7 (1.702 m)   Wt 68 kg   SpO2 94%   BMI 23.48 kg/m   Adult female, sitting up in bed, interactive and appropriate RRR, no murmurs, no peripheral edema Respiratory rate normal, lungs clear without rales or wheezes Abdomen soft, no tenderness palpation or guarding, no ascites or distention She has psychomotor slowing, she is very hard of hearing, she is oriented to person, place, and time face is  symmetric, speech fluent, severe generalized weakness    Data Reviewed: Basic metabolic panel shows normal electrolytes and renal function CBC shows mild anemia, normal leukocytosis   Family Communication: Friend at the bedside    Disposition: Status is: Inpatient         Author: Lonni SHAUNNA Dalton, MD 12/31/2023 3:19 PM  For on call review [email or web address].

## 2023-12-31 NOTE — Progress Notes (Signed)
° °  Brief Progress Note   _____________________________________________________________________________________________________________  Patient Name: Allison Hughes Patient DOB: Oct 19, 1950 Date: @TODAY @    Pt may need to go to SNF per CSW _____________________________________________________________________________________________________________  [facility] RN Expeditor Ronal DELENA Bald Please contact us  directly via secure chat (search for [facility]) or by calling us  at [phone] [facility]).

## 2024-01-01 ENCOUNTER — Other Ambulatory Visit: Payer: Self-pay

## 2024-01-01 ENCOUNTER — Inpatient Hospital Stay (HOSPITAL_COMMUNITY)
Admission: EM | Admit: 2024-01-01 | Discharge: 2024-01-09 | Disposition: A | Discharge status: Skilled Nursing Facility | Attending: Internal Medicine | Admitting: Internal Medicine

## 2024-01-01 DIAGNOSIS — J9811 Atelectasis: Secondary | ICD-10-CM | POA: Diagnosis present

## 2024-01-01 DIAGNOSIS — N390 Urinary tract infection, site not specified: Secondary | ICD-10-CM | POA: Diagnosis present

## 2024-01-01 DIAGNOSIS — I5032 Chronic diastolic (congestive) heart failure: Secondary | ICD-10-CM | POA: Diagnosis present

## 2024-01-01 DIAGNOSIS — F0394 Unspecified dementia, unspecified severity, with anxiety: Secondary | ICD-10-CM | POA: Diagnosis present

## 2024-01-01 DIAGNOSIS — Z888 Allergy status to other drugs, medicaments and biological substances status: Secondary | ICD-10-CM

## 2024-01-01 DIAGNOSIS — J441 Chronic obstructive pulmonary disease with (acute) exacerbation: Secondary | ICD-10-CM | POA: Diagnosis present

## 2024-01-01 DIAGNOSIS — Z6821 Body mass index (BMI) 21.0-21.9, adult: Secondary | ICD-10-CM

## 2024-01-01 DIAGNOSIS — Z833 Family history of diabetes mellitus: Secondary | ICD-10-CM

## 2024-01-01 DIAGNOSIS — Z79899 Other long term (current) drug therapy: Secondary | ICD-10-CM

## 2024-01-01 DIAGNOSIS — G9341 Metabolic encephalopathy: Secondary | ICD-10-CM | POA: Diagnosis present

## 2024-01-01 DIAGNOSIS — J9601 Acute respiratory failure with hypoxia: Principal | ICD-10-CM | POA: Diagnosis present

## 2024-01-01 DIAGNOSIS — Z7951 Long term (current) use of inhaled steroids: Secondary | ICD-10-CM

## 2024-01-01 DIAGNOSIS — I513 Intracardiac thrombosis, not elsewhere classified: Secondary | ICD-10-CM | POA: Diagnosis present

## 2024-01-01 DIAGNOSIS — K219 Gastro-esophageal reflux disease without esophagitis: Secondary | ICD-10-CM | POA: Diagnosis present

## 2024-01-01 DIAGNOSIS — I10 Essential (primary) hypertension: Secondary | ICD-10-CM | POA: Diagnosis present

## 2024-01-01 DIAGNOSIS — R627 Adult failure to thrive: Secondary | ICD-10-CM | POA: Diagnosis present

## 2024-01-01 DIAGNOSIS — Z813 Family history of other psychoactive substance abuse and dependence: Secondary | ICD-10-CM

## 2024-01-01 DIAGNOSIS — D3502 Benign neoplasm of left adrenal gland: Secondary | ICD-10-CM | POA: Diagnosis present

## 2024-01-01 DIAGNOSIS — G9340 Encephalopathy, unspecified: Secondary | ICD-10-CM | POA: Diagnosis present

## 2024-01-01 DIAGNOSIS — F05 Delirium due to known physiological condition: Secondary | ICD-10-CM | POA: Diagnosis present

## 2024-01-01 DIAGNOSIS — Z7984 Long term (current) use of oral hypoglycemic drugs: Secondary | ICD-10-CM

## 2024-01-01 DIAGNOSIS — E119 Type 2 diabetes mellitus without complications: Secondary | ICD-10-CM | POA: Diagnosis present

## 2024-01-01 DIAGNOSIS — I110 Hypertensive heart disease with heart failure: Secondary | ICD-10-CM | POA: Diagnosis present

## 2024-01-01 DIAGNOSIS — Z91013 Allergy to seafood: Secondary | ICD-10-CM

## 2024-01-01 DIAGNOSIS — Z7982 Long term (current) use of aspirin: Secondary | ICD-10-CM

## 2024-01-01 DIAGNOSIS — E440 Moderate protein-calorie malnutrition: Secondary | ICD-10-CM | POA: Diagnosis present

## 2024-01-01 DIAGNOSIS — R0902 Hypoxemia: Principal | ICD-10-CM

## 2024-01-01 DIAGNOSIS — E785 Hyperlipidemia, unspecified: Secondary | ICD-10-CM | POA: Diagnosis present

## 2024-01-01 DIAGNOSIS — Z7189 Other specified counseling: Secondary | ICD-10-CM

## 2024-01-01 DIAGNOSIS — R4587 Impulsiveness: Secondary | ICD-10-CM | POA: Diagnosis present

## 2024-01-01 DIAGNOSIS — G9389 Other specified disorders of brain: Secondary | ICD-10-CM | POA: Diagnosis present

## 2024-01-01 DIAGNOSIS — Z515 Encounter for palliative care: Secondary | ICD-10-CM

## 2024-01-01 DIAGNOSIS — D75839 Thrombocytosis, unspecified: Secondary | ICD-10-CM | POA: Diagnosis present

## 2024-01-01 DIAGNOSIS — B9689 Other specified bacterial agents as the cause of diseases classified elsewhere: Secondary | ICD-10-CM | POA: Diagnosis present

## 2024-01-01 DIAGNOSIS — Z95818 Presence of other cardiac implants and grafts: Secondary | ICD-10-CM

## 2024-01-01 DIAGNOSIS — D649 Anemia, unspecified: Secondary | ICD-10-CM | POA: Diagnosis present

## 2024-01-01 DIAGNOSIS — Z8673 Personal history of transient ischemic attack (TIA), and cerebral infarction without residual deficits: Secondary | ICD-10-CM

## 2024-01-01 DIAGNOSIS — F17210 Nicotine dependence, cigarettes, uncomplicated: Secondary | ICD-10-CM | POA: Diagnosis present

## 2024-01-01 LAB — CBC
HCT: 36.3 % (ref 36.0–46.0)
Hemoglobin: 11.7 g/dL — ABNORMAL LOW (ref 12.0–15.0)
MCH: 25.1 pg — ABNORMAL LOW (ref 26.0–34.0)
MCHC: 32.2 g/dL (ref 30.0–36.0)
MCV: 77.7 fL — ABNORMAL LOW (ref 80.0–100.0)
Platelets: 369 K/uL (ref 150–400)
RBC: 4.67 MIL/uL (ref 3.87–5.11)
RDW: 16 % — ABNORMAL HIGH (ref 11.5–15.5)
WBC: 9 K/uL (ref 4.0–10.5)
nRBC: 0 % (ref 0.0–0.2)

## 2024-01-01 LAB — BASIC METABOLIC PANEL WITH GFR
Anion gap: 10 (ref 5–15)
BUN: 9 mg/dL (ref 8–23)
CO2: 23 mmol/L (ref 22–32)
Calcium: 10 mg/dL (ref 8.9–10.3)
Chloride: 106 mmol/L (ref 98–111)
Creatinine, Ser: 0.66 mg/dL (ref 0.44–1.00)
GFR, Estimated: >60 mL/min (ref >60)
Glucose, Bld: 141 mg/dL — ABNORMAL HIGH (ref 70–99)
Potassium: 4.3 mmol/L (ref 3.5–5.1)
Sodium: 139 mmol/L (ref 135–145)

## 2024-01-01 LAB — GLUCOSE, CAPILLARY
Glucose-Capillary: 197 mg/dL — ABNORMAL HIGH (ref 70–99)
Glucose-Capillary: 193 mg/dL — ABNORMAL HIGH (ref 70–99)
Glucose-Capillary: 205 mg/dL — ABNORMAL HIGH (ref 70–99)
Glucose-Capillary: 168 mg/dL — ABNORMAL HIGH (ref 70–99)

## 2024-01-01 MED ORDER — QUETIAPINE FUMARATE 25 MG PO TABS: 25.0000 mg | ORAL_TABLET | Freq: Every day | ORAL | Status: DC

## 2024-01-01 MED ORDER — ALPRAZOLAM 0.5 MG PO TABS: 0.5000 mg | ORAL_TABLET | Freq: Every day | ORAL | 0 refills | Status: DC

## 2024-01-01 MED ORDER — MELATONIN 3 MG PO CAPS: 3.0000 mg | ORAL_CAPSULE | Freq: Every evening | ORAL | Status: AC

## 2024-01-01 MED ORDER — SULFAMETHOXAZOLE-TRIMETHOPRIM 800-160 MG PO TABS: 1.0000 | ORAL_TABLET | Freq: Two times a day (BID) | ORAL | Status: DC

## 2024-01-01 MED ORDER — QUETIAPINE FUMARATE 25 MG PO TABS: 25.0000 mg | ORAL_TABLET | Freq: Two times a day (BID) | ORAL | Status: DC

## 2024-01-01 MED ORDER — LACTATED RINGERS IV BOLUS: 1000.0000 mL | Freq: Once | INTRAVENOUS | Status: DC

## 2024-01-01 NOTE — ED Triage Notes (Signed)
 Pt brought by EMS, As per EMS pt got recently dc [facility] hours ago and sent to a rehab. Pt was hypoxic with 88 %at 5lnc. Alert x4

## 2024-01-01 NOTE — TOC Transition Note (Addendum)
 Transition of Care [facility]) - Discharge Note   Patient Details  Name: Allison Hughes MRN: [id number] Date of Birth: [birth year]  Transition of Care [facility]) CM/SW Contact:  Lendia Dais, LCSWA Phone Number: 01/01/2024, 3:14 PM   Clinical Narrative: Pt discharging to [name], RN report to [phone]. PTAR called at 1510 and will arrive within a few hours.  [name] friend Aleene was informed by MD of discharge.    76 - CSW spoke to APS worker Jon Marina who requested updates for the pt discharging to STR.  No further TOC needs.     Final next level of care: Skilled Nursing Facility Barriers to Discharge: Barriers Resolved   Patient Goals and CMS Choice            Discharge Placement              Patient chooses bed at: [facility] Patient to be transferred to facility by: PTAR Name of family member notified: Aleene (friend) Patient and family notified of of transfer: 01/01/24  Discharge Plan and Services Additional resources added to the After Visit Summary for                                       Social Drivers of Health (SDOH) Interventions SDOH Screenings   Food Insecurity: No Food Insecurity (12/29/2023)  Housing: Low Risk (12/29/2023)  Transportation Needs: No Transportation Needs (12/29/2023)  Utilities: Not At Risk (12/29/2023)  Social Connections: Unknown (12/30/2023)  Tobacco Use: High Risk (12/30/2023)     Readmission Risk Interventions    10/24/2023    2:14 PM  Readmission Risk Prevention Plan  Transportation Screening Complete  PCP or Specialist Appt within 3-5 Days Complete  HRI or Home Care Consult Complete  Social Work Consult for Recovery Care Planning/Counseling Complete  Palliative Care Screening Complete  Medication Review Oceanographer) Referral to Pharmacy

## 2024-01-01 NOTE — Progress Notes (Signed)
 Mobility Specialist: Progress Note   01/01/24 1600  Mobility  Activity Pivoted/transferred from chair to bed  Level of Assistance Moderate assist, patient does 50-74%  Assistive Device Front wheel walker  Activity Response Tolerated well;Tolerated fair  Mobility Referral Yes  Mobility visit 1 Mobility  Mobility Specialist Start Time (ACUTE ONLY) 1355  Mobility Specialist Stop Time (ACUTE ONLY) 1432  Mobility Specialist Time Calculation (min) (ACUTE ONLY) 37 min    Pt received in chair, agreeable to mobility session. Incontinent of BM in chair, required modA>maxA to stand 2x while MS performed pericare. Afterwards, pt transferred to bed via stand pivot with heavy modA d/t posterior lean. Once EOB, pt required modA for sit>supine. Once in supine, pt vomited and required modA to roll onto her side. TotA to assist with gown change and wash her mouth/dentures out. Left in bed with all needs met, call bell in reach. Bed alarm on, NT notified.  [name] Mobility Specialist Please contact via SecureChat or Rehab office at [phone]

## 2024-01-01 NOTE — ED Provider Notes (Incomplete)
 [place] EMERGENCY DEPARTMENT AT [facility] Provider Note  CSN: [id number] Arrival date & time: 01/01/24 2306  Chief Complaint(s) No chief complaint on file.  HPI Allison Hughes is a 73 y.o. female who is here today after she was discharged earlier today.  She had been admitted for encephalopathy, UTI.  When she arrived at SNF, patient was hypoxic.  EMS reports that the [facility] team had been concerned when transferring patient from the hospital to the nursing home.  When they arrived at the nursing home, they stated they could not take this patient as she had a new oxygen requirement.  Patient has a history of dementia, COPD and heart failure.   Past Medical History Past Medical History:  Diagnosis Date   Acute metabolic encephalopathy 10/20/2023   Acute pyelonephritis 02/02/2016   Acute respiratory failure with hypoxia (HCC) 09/20/2023   Diabetes mellitus 01/15/2004   T2DM   Hyperlipidemia    Hypertension    Obesity (BMI 30-39.9)    Pneumonia 09/06/2020   Sepsis (HCC) 02/02/2016   Stroke (HCC)    Urinary tract infection 10/20/2023   Patient Active Problem List   Diagnosis Date Noted   Acute cystitis with hematuria 12/29/2023   Generalized weakness 12/29/2023   Failure to thrive in adult 12/29/2023   Recurrent falls 12/29/2023   Chronic obstructive pulmonary disease (HCC) 12/29/2023   Acute metabolic encephalopathy 12/28/2023   Acute encephalopathy 11/29/2023   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   GERD (gastroesophageal reflux disease) 11/29/2023   Chronic heart failure with preserved ejection fraction (HFpEF) (HCC) 10/20/2023   Fall at home, initial encounter 10/20/2023   Hyperlipidemia 10/20/2023   History of gastric ulcer 10/20/2023   ABLA (acute blood loss anemia) 09/19/2023   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 Authorizing Provider  acetaminophen  (TYLENOL ) 500 MG tablet Take 2 tablets (1,000 mg total) by mouth every 8 (eight) hours as needed for mild pain (pain score 1-3) or headache. 09/23/23   Kathrin Mignon DASEN, MD  albuterol  (VENTOLIN  HFA) 108 (90 Base) MCG/ACT inhaler Inhale 2 puffs into the lungs every 6 (six) hours as needed for wheezing or shortness of breath. 06/06/22   Ghimire, Donalda HERO, MD  ALPRAZolam  (XANAX ) 0.5 MG tablet Take 1 tablet (0.5 mg total) by mouth at bedtime. 01/01/24   Danford, Lonni SQUIBB, MD  aspirin  EC 81 MG tablet Take 1 tablet (81 mg total) by mouth daily. Swallow whole. 12/05/23   Patsy Lenis, MD  atorvastatin  (LIPITOR) 40 MG tablet Take 40 mg by mouth daily. 08/06/18   [provider]  Fluticasone -Umeclidin-Vilant (TRELEGY ELLIPTA) 100-62.5-25 MCG/ACT AEPB Inhale 1 puff into the lungs daily at 12 noon. Patient not taking: Reported on 12/01/2023    [provider]  lisinopril  (ZESTRIL ) 20 MG tablet Take 20 mg by mouth daily.    [provider]  Melatonin 3 MG CAPS Take 1 capsule (3 mg total) by mouth at bedtime. 01/01/24   Danford, Lonni SQUIBB, MD  metFORMIN  (GLUCOPHAGE ) 500 MG tablet Take 500 mg by mouth at bedtime. 11/26/23   [provider]  pantoprazole  (PROTONIX ) 40 MG tablet Take  40 mg by mouth daily.    [provider]  QUEtiapine  (SEROQUEL ) 25 MG tablet Take 1-2 tablets (25-50 mg total) by mouth 2 (two) times daily. Take 25 mg in morning and 50 mg at night 01/01/24   Danford, Lonni SQUIBB, MD  sulfamethoxazole -trimethoprim  (BACTRIM  DS) 800-160 MG tablet Take 1 tablet by mouth every 12 (twelve) hours. 01/01/24   Danford, Lonni SQUIBB, MD  TRESIBA  100 UNIT/ML SOLN Inject 15  Units into the skin at bedtime. 09/23/23   Gonfa, Taye T, MD                                                                                                                                    Past Surgical History Past Surgical History:  Procedure Laterality Date   CESAREAN SECTION     LOOP RECORDER INSERTION N/A 09/08/2020   Procedure: LOOP RECORDER INSERTION;  Surgeon: Inocencio Soyla Lunger, MD;  Location: [facility] INVASIVE CV LAB;  Service: Cardiovascular;  Laterality: N/A;   ORIF ANKLE FRACTURE  05/11/2011   Procedure: OPEN REDUCTION INTERNAL FIXATION (ORIF) ANKLE FRACTURE;  Surgeon: Jerona LULLA Sage, MD;  Location: [facility] OR;  Service: Orthopedics;  Laterality: Right;   TUBAL LIGATION     Family History Family History  Problem Relation Age of Onset   Drug abuse Daughter    Diabetes Daughter     Social History Social History[1] Allergies Iodine and Shrimp [shellfish allergy]  Review of Systems Review of Systems *** Physical Exam Vital Signs  I have reviewed the triage vital signs BP (!) 145/68 (BP Location: Left Arm)   Pulse 96   Temp 98.4 F (36.9 C) (Oral)   Resp (!) 21   SpO2 90%  *** Physical Exam Vitals and nursing note reviewed.  HENT:     Head: Normocephalic and atraumatic.  Eyes:     Pupils: Pupils are equal, round, and reactive to light.  Cardiovascular:     Rate and Rhythm: Normal rate.  Pulmonary:     Comments: Tachypnea, no wheezing Abdominal:     General: Abdomen is flat.     Palpations: Abdomen is soft.  Musculoskeletal:        General: Normal range of motion.  Skin:    General: Skin is warm.  Neurological:     General: No focal deficit present.     Mental Status: She is alert.     ED Results and Treatments Labs (all labs ordered are listed, but only abnormal results are displayed) Labs Reviewed  CULTURE, BLOOD (ROUTINE X 2)  CULTURE, BLOOD (ROUTINE X 2)  BLOOD GAS, VENOUS  BASIC METABOLIC PANEL WITH GFR  CBC WITH DIFFERENTIAL/PLATELET  LIPASE,  BLOOD  PRO BRAIN NATRIURETIC PEPTIDE  I-STAT CG4 LACTIC ACID, ED  TROPONIN T, HIGH SENSITIVITY  Radiology No results found.  Pertinent labs & imaging results that were available during my care of the patient were reviewed by me and considered in my medical decision making (see MDM for details).  Medications Ordered in ED Medications - No data to display                                                                                                                                   Procedures Procedures  (including critical care time)  Medical Decision Making / ED Course   This patient presents to the ED for concern of hypoxia, this involves an extensive number of treatment options, and is a complaint that carries with it a high risk of complications and morbidity.  The differential diagnosis includes COPD exacerbation, fluid overload, pneumonia, PE.  MDM: Patient currently satting 91% on 6 L nasal cannula.  She does not have home O2.  She has multiple possible etiologies for her hypoxia.  Will obtain imaging of the patient's chest.  Patient also with a few episodes of vomiting, will image patient's abdomen pelvis.   Additional history obtained: -Additional history obtained from *** -External records from outside source obtained and reviewed including: Chart review including previous notes, labs, imaging, consultation notes   Lab Tests: -I ordered, reviewed, and interpreted labs.   The pertinent results include:   Labs Reviewed  CULTURE, BLOOD (ROUTINE X 2)  CULTURE, BLOOD (ROUTINE X 2)  BLOOD GAS, VENOUS  BASIC METABOLIC PANEL WITH GFR  CBC WITH DIFFERENTIAL/PLATELET  LIPASE, BLOOD  PRO BRAIN NATRIURETIC PEPTIDE  I-STAT CG4 LACTIC ACID, ED  TROPONIN T, HIGH SENSITIVITY      EKG ***  EKG Interpretation Date/Time:    Ventricular Rate:    PR  Interval:    QRS Duration:    QT Interval:    QTC Calculation:   R Axis:      Text Interpretation:           Imaging Studies ordered: I ordered imaging studies including *** I independently visualized and interpreted imaging. I agree with the radiologist interpretation   Medicines ordered and prescription drug management: Meds ordered this encounter  Medications   DISCONTD: lactated ringers  bolus 1,000 mL    -I have reviewed the patients home medicines and have made adjustments as needed  Critical interventions ***  Consultations Obtained: I requested consultation with the ***,  and discussed lab and imaging findings as well as pertinent plan - they recommend: ***   Cardiac Monitoring: The patient was maintained on a cardiac monitor.  I personally viewed and interpreted the cardiac monitored which showed an underlying rhythm of: ***  Social Determinants of Health:  Factors impacting patients care include: ***   Reevaluation: After the interventions noted above, I reevaluated the patient and found that they have :{resolved/improved/worsened:23923::improved}  Co morbidities that complicate the patient evaluation  Past Medical History:  Diagnosis Date   Acute metabolic  encephalopathy 10/20/2023   Acute pyelonephritis 02/02/2016   Acute respiratory failure with hypoxia (HCC) 09/20/2023   Diabetes mellitus 01/15/2004   T2DM   Hyperlipidemia    Hypertension    Obesity (BMI 30-39.9)    Pneumonia 09/06/2020   Sepsis (HCC) 02/02/2016   Stroke (HCC)    Urinary tract infection 10/20/2023      Dispostion: I considered admission for this patient, ***     Final Clinical Impression(s) / ED Diagnoses Final diagnoses:  None     @PCDICTATION @     [1]  Social History Tobacco Use   Smoking status: Every Day    Current packs/day: 1.00    Average packs/day: 1 pack/day for 40.0 years (40.0 ttl pk-yrs)    Types: Cigarettes   Smokeless tobacco: Former     Quit date: 05/11/2011  Substance Use Topics   Alcohol use: No   Drug use: No

## 2024-01-01 NOTE — Discharge Summary (Addendum)
 Physician Discharge Summary   Patient: Allison Hughes MRN: [id number] DOB: 1950/12/24  Admit date:     12/28/2023  Discharge date: 01/01/2024  Discharge Physician: Lonni SHAUNNA Dalton   PCP: Valma Carwin, MD     Recommendations at discharge:  Follow up with PCP 1 week after SNF discharge Dr. Valma: Please refer for dementia evaluation     Discharge Diagnoses: Principal Problem:   Acute metabolic encephalopathy due to UTI Active Problems:   Chronic heart failure with preserved ejection fraction (HFpEF) (HCC)   Acute cystitis with hematuria due to Ctirobacter   Generalized weakness   Failure to thrive in adult   Recurrent falls   Chronic obstructive pulmonary disease [facility])      Hospital Course: 73 y.o. F with hx dementia, lives at home alone, HTN, hx stroke, COPD not on home O2, T2DM, dCHF, and aortic mural thrombus and hx GI bleed who presented with generalized weakness, and confusion.  Found to have UTI.    Acute metabolic encephalopathy Dehydration Admitted with confusion, due to UTI and dehydration.  Xanax  held and this resolved.   Appears to have incipient, as-yet-undiagnosed dementia as well.    Reduce Xanax .  Reduce quetiapine  to 25 mg each morning and 50 mg nightly      Citrobacter UTI Urine culture growing Citrobacter.  Started on Bactrim  and clinically improved.   Complete 4 more days Bactrim    History of dementia Continue quetiapine  at lower dose.   Type 2 diabetes with hyperglycemia Glucose stabilized. Continue home glargine and metformin     Chronic diastolic congestive heart failure Hypertension Stable on lisinopril    COPD No evidence of flare    History of mural thrombus On aspirin  only   Hypokalemia Supplemented and resolved   Anemia Hemoglobin stable             The [place]  Controlled Substances Registry was reviewed for this patient prior to discharge.  Consultants: None needed   Disposition: Skilled  nursing facility Diet recommendation:  Carb modified diet  DISCHARGE MEDICATION: Allergies as of 01/01/2024       Reactions   Iodine Swelling   Shrimp [shellfish Allergy] Swelling   Fresh shrimp        Medication List     TAKE these medications    acetaminophen  500 MG tablet Commonly known as: TYLENOL  Take 2 tablets (1,000 mg total) by mouth every 8 (eight) hours as needed for mild pain (pain score 1-3) or headache.   albuterol  108 (90 Base) MCG/ACT inhaler Commonly known as: VENTOLIN  HFA Inhale 2 puffs into the lungs every 6 (six) hours as needed for wheezing or shortness of breath.   ALPRAZolam  0.5 MG tablet Commonly known as: XANAX  Take 1 tablet (0.5 mg total) by mouth at bedtime. What changed:  medication strength how much to take when to take this   aspirin  EC 81 MG tablet Take 1 tablet (81 mg total) by mouth daily. Swallow whole.   atorvastatin  40 MG tablet Commonly known as: LIPITOR Take 40 mg by mouth daily.   lisinopril  20 MG tablet Commonly known as: ZESTRIL  Take 20 mg by mouth daily. What changed: Another medication with the same name was removed. Continue taking this medication, and follow the directions you see here.   Melatonin 3 MG Caps Take 1 capsule (3 mg total) by mouth at bedtime.   metFORMIN  500 MG tablet Commonly known as: GLUCOPHAGE  Take 500 mg by mouth at bedtime.   pantoprazole  40 MG tablet Commonly  known as: PROTONIX  Take 40 mg by mouth daily.   QUEtiapine  25 MG tablet Commonly known as: SEROQUEL  Take 1-2 tablets (25-50 mg total) by mouth 2 (two) times daily. Take 25 mg in morning and 50 mg at night What changed:  medication strength how much to take additional instructions   sulfamethoxazole -trimethoprim  800-160 MG tablet Commonly known as: BACTRIM  DS Take 1 tablet by mouth every 12 (twelve) hours.   Trelegy Ellipta 100-62.5-25 MCG/ACT Aepb Generic drug: Fluticasone -Umeclidin-Vilant Inhale 1 puff into the lungs daily  at 12 noon.   Tresiba  100 UNIT/ML Soln Generic drug: Insulin  Degludec Inject 15 Units into the skin at bedtime.        Contact information for after-discharge care     Destination     [facility] .   Service: Skilled Nursing Contact information: [address] [place]  [address] [phone]                     Discharge Instructions     Increase activity slowly   Complete by: As directed        Discharge Exam: Filed Weights   12/28/23 1834  Weight: 68 kg    General: Pt is alert, awake, not in acute distress Cardiovascular: RRR, nl S1-S2, no murmurs appreciated.   No LE edema.   Respiratory: Normal respiratory rate and rhythm.  CTAB without rales or wheezes. Abdominal: Abdomen soft and non-tender.  No distension or HSM.   Neuro/Psych: Strength symmetric in upper and lower extremities.  Judgment and insight appear impaired but at baseline.   Condition at discharge: fair  The results of significant diagnostics from this hospitalization (including imaging, microbiology, ancillary and laboratory) are listed below for reference.   Imaging Studies: CT Cervical Spine Wo Contrast Result Date: 12/28/2023 EXAM: CT CERVICAL SPINE WITHOUT CONTRAST 12/28/2023 07:16:52 PM TECHNIQUE: CT of the cervical spine was performed without the administration of intravenous contrast. Multiplanar reformatted images are provided for review. Automated exposure control, iterative reconstruction, and/or weight based adjustment of the mA/kV was utilized to reduce the radiation dose to as low as reasonably achievable. COMPARISON: None available. CLINICAL HISTORY: Neck trauma (Age >= 65y) FINDINGS: BONES AND ALIGNMENT: No acute fracture or traumatic malalignment. DEGENERATIVE CHANGES: No significant degenerative changes. SOFT TISSUES: No prevertebral soft tissue swelling. IMPRESSION: 1. No significant abnormality Electronically signed by: Pinkie Pebbles MD  12/28/2023 07:27 PM EST RP Workstation: [id number]   CT Head Wo Contrast Result Date: 12/28/2023 EXAM: CT HEAD WITHOUT 12/28/2023 07:16:52 PM TECHNIQUE: CT of the head was performed without the administration of intravenous contrast. Automated exposure control, iterative reconstruction, and/or weight based adjustment of the mA/kV was utilized to reduce the radiation dose to as low as reasonably achievable. COMPARISON: 11/29/2023 CLINICAL HISTORY: Head trauma, minor (Age >= 65y) FINDINGS: BRAIN AND VENTRICLES: No acute intracranial hemorrhage. No mass effect or midline shift. No extra-axial fluid collection. Generalized cerebral atrophy and advanced chronic small vessel ischemic disease. 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 Bilateral basal ganglia mineralization. Intracranial arterial calcification. No hydrocephalus. ORBITS: No acute abnormality. SINUSES AND MASTOIDS: No acute abnormality. SOFT TISSUES AND SKULL: No acute skull fracture. No acute soft tissue abnormality. IMPRESSION: 1. No acute intracranial abnormality. 2. Old right parietal and left cerebellar infarcts. Electronically signed by: Pinkie Pebbles MD 12/28/2023 07:25 PM EST RP Workstation: [id number]   DG Chest Port 1 View Result Date: 12/28/2023 CLINICAL DATA:  Fall.  Generalized weakness. EXAM: PORTABLE CHEST 1 VIEW  COMPARISON:  11/29/2023. FINDINGS: The heart size and mediastinal contours are within normal limits. There is atherosclerotic calcification of the aorta. The pulmonary vasculature is distended. Perihilar interstitial prominence is noted bilaterally. There is mild airspace disease at the lung bases. A loop recorder device is seen over the left chest. No consolidation, effusion, or pneumothorax. No acute osseous abnormality. IMPRESSION: 1. Mildly distended pulmonary vasculature with interstitial prominence bilaterally, possible edema or infiltrate. 2. Mild airspace disease at the lung bases,  possible atelectasis or infiltrate. Electronically Signed   By: Leita Birmingham M.D.   On: 12/28/2023 19:10   CUP PACEART REMOTE DEVICE CHECK Result Date: 12/23/2023 ILR summary report received. Battery status OK. Normal device function. No new symptom, tachy, brady, or pause episodes. No new AF episodes. Monthly summary reports and ROV/PRN.  No histogram data, acceptable HR graph LA, CVRS   Microbiology: Results for orders placed or performed during the hospital encounter of 12/28/23  Urine Culture     Status: Abnormal   Collection Time: 12/28/23  7:41 PM   Specimen: Urine, Random  Result Value Ref Range Status   Specimen Description URINE, RANDOM  Final   Special Requests   Final    URINE, CLEAN CATCH Performed at [facility], [address] [place], [place] [address]    Culture >=100,000 COLONIES/mL CITROBACTER FREUNDII (A)  Final   Report Status 12/30/2023 FINAL  Final   Organism ID, Bacteria CITROBACTER FREUNDII (A)  Final      Susceptibility   Citrobacter freundii - MIC*    CEFEPIME <=0.12 SENSITIVE Sensitive     ERTAPENEM <=0.12 SENSITIVE Sensitive     CEFTRIAXONE  8 RESISTANT Resistant     CIPROFLOXACIN <=0.06 SENSITIVE Sensitive     GENTAMICIN <=1 SENSITIVE Sensitive     NITROFURANTOIN <=16 SENSITIVE Sensitive     TRIMETH /SULFA  <=20 SENSITIVE Sensitive     PIP/TAZO Value in next row Sensitive      <=4 SENSITIVEThis is a modified FDA-approved test that has been validated and its performance characteristics determined by the reporting laboratory.  This laboratory is certified under the Clinical Laboratory Improvement Amendments CLIA as qualified to perform high complexity clinical laboratory testing.    MEROPENEM Value in next row Sensitive      <=4 SENSITIVEThis is a modified FDA-approved test that has been validated and its performance characteristics determined by the reporting laboratory.  This laboratory is certified under the Clinical Laboratory Improvement  Amendments CLIA as qualified to perform high complexity clinical laboratory testing.    * >=100,000 COLONIES/mL CITROBACTER FREUNDII    Labs: CBC: Recent Labs  Lab 12/28/23 1815 12/29/23 0424 12/30/23 0452 12/31/23 0416 01/01/24 0504  WBC 10.6* 8.6 6.4 6.0 9.0  NEUTROABS 8.2*  --   --   --   --   HGB 11.7* 11.0* 10.9* 10.7* 11.7*  HCT 38.1 35.2* 35.2* 33.4* 36.3  MCV 79.5* 80.7 78.9* 78.6* 77.7*  PLT 351 333 360 334 369   Basic Metabolic Panel: Recent Labs  Lab 12/28/23 1815 12/29/23 0424 12/30/23 0452 12/31/23 0416 01/01/24 0504  NA 143 141 143 142 139  K 3.6 3.5 3.2* 4.4 4.3  CL 111 108 110 111 106  CO2 25 26 27 23 23   GLUCOSE 213* 132* 100* 113* 141*  BUN 14 13 8 9 9   CREATININE 0.57 0.49 0.49 0.53 0.66  CALCIUM  8.9 8.8* 8.7* 9.4 10.0   Liver Function Tests: Recent Labs  Lab 12/28/23 1815  AST 15  ALT  13  ALKPHOS 123  BILITOT 0.5  PROT 6.0*  ALBUMIN 2.8*   CBG: Recent Labs  Lab 12/31/23 1106 12/31/23 1648 12/31/23 2034 01/01/24 0829 01/01/24 1106  GLUCAP 169* 168* 165* 197* 193*    Discharge time spent: approximately 45 minutes spent on discharge counseling, evaluation of patient on day of discharge, and coordination of discharge planning with nursing, social work, pharmacy and case management  Signed: Lonni SHAUNNA Dalton, MD [facility] 01/01/2024

## 2024-01-01 NOTE — TOC Progression Note (Addendum)
 Transition of Care [facility]) - Progression Note    Patient Details  Name: Allison Hughes MRN: [id number] Date of Birth: January 19, 1950  Transition of Care [facility]) CM/SW Contact  Lendia Dais, [place] Phone Number: 01/01/2024, 10:17 AM  Clinical Narrative: MD informed CSW that the pt is medically stable.   CSW spoke to pt at bedside and informed them of being medically stable and inquired about a SNF choice. Pt was chose [facility].  [name] is pending to [facility]. Auth ID is [id number]. CSW spoke to [facility] who stated they can take the pt today if shara is approved.  [id number] - Auth is approved from 01/01/2024-01/05/2024. CSW notified MD via secure chat of auth approval and that [name] would need the DC summary before 3PM.  CSW will continue to monitor auth status.                      Expected Discharge Plan and Services                                               Social Drivers of Health (SDOH) Interventions SDOH Screenings   Food Insecurity: No Food Insecurity (12/29/2023)  Housing: Low Risk (12/29/2023)  Transportation Needs: No Transportation Needs (12/29/2023)  Utilities: Not At Risk (12/29/2023)  Social Connections: Unknown (12/30/2023)  Tobacco Use: High Risk (12/30/2023)    Readmission Risk Interventions    10/24/2023    2:14 PM  Readmission Risk Prevention Plan  Transportation Screening Complete  PCP or Specialist Appt within 3-5 Days Complete  HRI or Home Care Consult Complete  Social Work Consult for Recovery Care Planning/Counseling Complete  Palliative Care Screening Complete  Medication Review Oceanographer) Referral to Pharmacy

## 2024-01-01 NOTE — Progress Notes (Signed)
 Occupational Therapy Treatment Patient Details Name: Allison Hughes MRN: [id number] DOB: Feb 18, 1950 Today's Date: 01/01/2024   History of present illness 73 yo F adm 12/14 fall earlier in the day and found on couch by friend. Pt found to have UTI. PMH 11/15 fall, 10/8-10/26 fall, 9/5-9/10 fall, HTN HLD CVA COPD DM2 GIB, Ob[facility], aphasia   OT comments  Patient received in supine and agreeable to OT treatment. Patient perseverating on knowing the hospital's address and phone number and required cues to redirect. Patient making good gains with bed mobility and transfers with min to mod assist. Patient performed grooming and self feeding seated in recliner with setup and supervision and cues for sequencing. Patient will benefit from continued inpatient follow up therapy, <3 hours/day.  Acute OT to continue to follow to address established goals to facilitate DC to next venue of care.        If plan is discharge home, recommend the following:  Assist for transportation;Assistance with cooking/housework;A lot of help with bathing/dressing/bathroom;Supervision due to cognitive status;A lot of help with walking and/or transfers   Equipment Recommendations  None recommended by OT    Recommendations for Other Services      Precautions / Restrictions Precautions Precautions: Fall Recall of Precautions/Restrictions: Impaired Restrictions Weight Bearing Restrictions Per Provider Order: No       Mobility Bed Mobility Overal bed mobility: Needs Assistance Bed Mobility: Supine to Sit     Supine to sit: Min assist     General bed mobility comments: assistance with BLEs and scooting to EOB, left in recliner at end of session    Transfers Overall transfer level: Needs assistance Equipment used: Rolling walker (2 wheels) Transfers: Sit to/from Stand, Bed to chair/wheelchair/BSC Sit to Stand: Min assist     Step pivot transfers: Min assist     General  transfer comment: cues for hand placement and min assist to power up and with walker management     Balance Overall balance assessment: Needs assistance Sitting-balance support: Feet unsupported, Bilateral upper extremity supported Sitting balance-Leahy Scale: Fair Sitting balance - Comments: supervision EOB   Standing balance support: Bilateral upper extremity supported, During functional activity, Reliant on assistive device for balance Standing balance-Leahy Scale: Poor Standing balance comment: reliant on RW for support                           ADL either performed or assessed with clinical judgement   ADL Overall ADL's : Needs assistance/impaired Eating/Feeding: Set up;Sitting Eating/Feeding Details (indicate cue type and reason): assistance with opening containers Grooming: Wash/dry hands;Wash/dry face;Oral care;Supervision/safety;Cueing for sequencing;Sitting                                      Extremity/Trunk Assessment              Occupational Psychologist Communication: Impaired Factors Affecting Communication: Hearing impaired   Cognition Arousal: Alert Behavior During Therapy: Impulsive Cognition: Cognition impaired   Orientation impairments: Place, Time, Situation Awareness: Intellectual awareness impaired Memory impairment (select all impairments): Short-term memory, Working memory Attention impairment (select first level of impairment): Focused attention Executive functioning impairment (select all impairments): Initiation, Sequencing, Problem solving OT - Cognition Comments: proseverating on wanting the Hospital's address and phone number  Following commands: Impaired Following commands impaired: Follows one step commands with increased time, Follows multi-step commands inconsistently, Follows one step commands inconsistently      Cueing   Cueing  Techniques: Verbal cues, Gestural cues, Tactile cues  Exercises      Shoulder Instructions       General Comments VSS on RA    Pertinent Vitals/ Pain       Pain Assessment Pain Assessment: No/denies pain  Home Living                                          Prior Functioning/Environment              Frequency  Min 2X/week        Progress Toward Goals  OT Goals(current goals can now be found in the care plan section)  Progress towards OT goals: Progressing toward goals  Acute Rehab OT Goals OT Goal Formulation: Patient unable to participate in goal setting Time For Goal Achievement: 01/12/24 Potential to Achieve Goals: Fair ADL Goals Pt Will Perform Eating: with supervision;sitting Pt Will Perform Grooming: with supervision;sitting Pt Will Perform Upper Body Bathing: with min assist;sitting Pt Will Perform Upper Body Dressing: with min assist;sitting  Plan      Co-evaluation                 AM-PAC OT 6 Clicks Daily Activity     Outcome Measure   Help from another person eating meals?: A Little Help from another person taking care of personal grooming?: A Little Help from another person toileting, which includes using toliet, bedpan, or urinal?: A Lot Help from another person bathing (including washing, rinsing, drying)?: A Lot Help from another person to put on and taking off regular upper body clothing?: A Lot Help from another person to put on and taking off regular lower body clothing?: A Lot 6 Click Score: 14    End of Session Equipment Utilized During Treatment: Gait belt;Rolling walker (2 wheels)  OT Visit Diagnosis: Other symptoms and signs involving cognitive function   Activity Tolerance Patient tolerated treatment well   Patient Left in chair;with call bell/phone within reach;with chair alarm set   Nurse Communication Mobility status        Time: 9281-9258 OT Time Calculation (min): 23 min  Charges: OT  General Charges $OT Visit: 1 Visit OT Treatments $Self Care/Home Management : 23-37 mins  Dick Laine, OTA Acute Rehabilitation Services  Office [phone]   Jeb LITTIE Laine 01/01/2024, 9:25 AM

## 2024-01-02 ENCOUNTER — Emergency Department (HOSPITAL_COMMUNITY)

## 2024-01-02 ENCOUNTER — Inpatient Hospital Stay (HOSPITAL_COMMUNITY)

## 2024-01-02 ENCOUNTER — Encounter (HOSPITAL_COMMUNITY): Payer: Self-pay

## 2024-01-02 DIAGNOSIS — Z91013 Allergy to seafood: Secondary | ICD-10-CM | POA: Diagnosis not present

## 2024-01-02 DIAGNOSIS — J441 Chronic obstructive pulmonary disease with (acute) exacerbation: Secondary | ICD-10-CM | POA: Diagnosis present

## 2024-01-02 DIAGNOSIS — F0394 Unspecified dementia, unspecified severity, with anxiety: Secondary | ICD-10-CM | POA: Diagnosis present

## 2024-01-02 DIAGNOSIS — E440 Moderate protein-calorie malnutrition: Secondary | ICD-10-CM | POA: Diagnosis present

## 2024-01-02 DIAGNOSIS — Z79899 Other long term (current) drug therapy: Secondary | ICD-10-CM | POA: Diagnosis not present

## 2024-01-02 DIAGNOSIS — I513 Intracardiac thrombosis, not elsewhere classified: Secondary | ICD-10-CM | POA: Diagnosis present

## 2024-01-02 DIAGNOSIS — Z7982 Long term (current) use of aspirin: Secondary | ICD-10-CM | POA: Diagnosis not present

## 2024-01-02 DIAGNOSIS — B9689 Other specified bacterial agents as the cause of diseases classified elsewhere: Secondary | ICD-10-CM | POA: Diagnosis present

## 2024-01-02 DIAGNOSIS — Z794 Long term (current) use of insulin: Secondary | ICD-10-CM | POA: Diagnosis not present

## 2024-01-02 DIAGNOSIS — Z7984 Long term (current) use of oral hypoglycemic drugs: Secondary | ICD-10-CM | POA: Diagnosis not present

## 2024-01-02 DIAGNOSIS — I10 Essential (primary) hypertension: Secondary | ICD-10-CM | POA: Diagnosis not present

## 2024-01-02 DIAGNOSIS — Z7189 Other specified counseling: Secondary | ICD-10-CM | POA: Diagnosis not present

## 2024-01-02 DIAGNOSIS — E1169 Type 2 diabetes mellitus with other specified complication: Secondary | ICD-10-CM | POA: Diagnosis not present

## 2024-01-02 DIAGNOSIS — I5032 Chronic diastolic (congestive) heart failure: Secondary | ICD-10-CM | POA: Diagnosis present

## 2024-01-02 DIAGNOSIS — J9601 Acute respiratory failure with hypoxia: Secondary | ICD-10-CM | POA: Diagnosis present

## 2024-01-02 DIAGNOSIS — F17210 Nicotine dependence, cigarettes, uncomplicated: Secondary | ICD-10-CM | POA: Diagnosis present

## 2024-01-02 DIAGNOSIS — G9341 Metabolic encephalopathy: Secondary | ICD-10-CM

## 2024-01-02 DIAGNOSIS — Z515 Encounter for palliative care: Secondary | ICD-10-CM | POA: Diagnosis not present

## 2024-01-02 DIAGNOSIS — G9340 Encephalopathy, unspecified: Secondary | ICD-10-CM | POA: Diagnosis not present

## 2024-01-02 DIAGNOSIS — N390 Urinary tract infection, site not specified: Secondary | ICD-10-CM | POA: Diagnosis present

## 2024-01-02 DIAGNOSIS — Z8673 Personal history of transient ischemic attack (TIA), and cerebral infarction without residual deficits: Secondary | ICD-10-CM | POA: Diagnosis not present

## 2024-01-02 DIAGNOSIS — E785 Hyperlipidemia, unspecified: Secondary | ICD-10-CM | POA: Diagnosis present

## 2024-01-02 DIAGNOSIS — F05 Delirium due to known physiological condition: Secondary | ICD-10-CM | POA: Diagnosis present

## 2024-01-02 DIAGNOSIS — J9811 Atelectasis: Secondary | ICD-10-CM | POA: Diagnosis present

## 2024-01-02 DIAGNOSIS — D649 Anemia, unspecified: Secondary | ICD-10-CM | POA: Diagnosis present

## 2024-01-02 DIAGNOSIS — Z6821 Body mass index (BMI) 21.0-21.9, adult: Secondary | ICD-10-CM | POA: Diagnosis not present

## 2024-01-02 DIAGNOSIS — I110 Hypertensive heart disease with heart failure: Secondary | ICD-10-CM | POA: Diagnosis present

## 2024-01-02 DIAGNOSIS — E119 Type 2 diabetes mellitus without complications: Secondary | ICD-10-CM | POA: Diagnosis present

## 2024-01-02 DIAGNOSIS — G9389 Other specified disorders of brain: Secondary | ICD-10-CM | POA: Diagnosis present

## 2024-01-02 LAB — CBC WITH DIFFERENTIAL/PLATELET
Abs Immature Granulocytes: 0.09 K/uL — ABNORMAL HIGH (ref 0.00–0.07)
Abs Immature Granulocytes: 0.44 K/uL — ABNORMAL HIGH (ref 0.00–0.07)
Basophils Absolute: 0 K/uL (ref 0.0–0.1)
Basophils Absolute: 0.1 K/uL (ref 0.0–0.1)
Basophils Relative: 0 %
Basophils Relative: 0 %
Eosinophils Absolute: 0 K/uL (ref 0.0–0.5)
Eosinophils Absolute: 0 K/uL (ref 0.0–0.5)
Eosinophils Relative: 0 %
Eosinophils Relative: 0 %
HCT: 34.7 % — ABNORMAL LOW (ref 36.0–46.0)
HCT: 39.1 % (ref 36.0–46.0)
Hemoglobin: 10.9 g/dL — ABNORMAL LOW (ref 12.0–15.0)
Hemoglobin: 12.1 g/dL (ref 12.0–15.0)
Immature Granulocytes: 0 %
Immature Granulocytes: 2 %
Lymphocytes Relative: 2 %
Lymphocytes Relative: 2 %
Lymphs Abs: 0.3 K/uL — ABNORMAL LOW (ref 0.7–4.0)
Lymphs Abs: 0.6 K/uL — ABNORMAL LOW (ref 0.7–4.0)
MCH: 24.6 pg — ABNORMAL LOW (ref 26.0–34.0)
MCH: 24.8 pg — ABNORMAL LOW (ref 26.0–34.0)
MCHC: 30.9 g/dL (ref 30.0–36.0)
MCHC: 31.4 g/dL (ref 30.0–36.0)
MCV: 78.9 fL — ABNORMAL LOW (ref 80.0–100.0)
MCV: 79.6 fL — ABNORMAL LOW (ref 80.0–100.0)
Monocytes Absolute: 0.3 K/uL (ref 0.1–1.0)
Monocytes Absolute: 0.8 K/uL (ref 0.1–1.0)
Monocytes Relative: 1 %
Monocytes Relative: 3 %
Neutro Abs: 20.6 K/uL — ABNORMAL HIGH (ref 1.7–7.7)
Neutro Abs: 22.9 K/uL — ABNORMAL HIGH (ref 1.7–7.7)
Neutrophils Relative %: 95 %
Neutrophils Relative %: 95 %
Platelets: 397 K/uL (ref 150–400)
Platelets: 429 K/uL — ABNORMAL HIGH (ref 150–400)
RBC: 4.4 MIL/uL (ref 3.87–5.11)
RBC: 4.91 MIL/uL (ref 3.87–5.11)
RDW: 15.9 % — ABNORMAL HIGH (ref 11.5–15.5)
RDW: 16 % — ABNORMAL HIGH (ref 11.5–15.5)
WBC: 21.8 K/uL — ABNORMAL HIGH (ref 4.0–10.5)
WBC: 24.2 K/uL — ABNORMAL HIGH (ref 4.0–10.5)
nRBC: 0 % (ref 0.0–0.2)
nRBC: 0 % (ref 0.0–0.2)

## 2024-01-02 LAB — BLOOD GAS, VENOUS
Acid-base deficit: 0.1 mmol/L (ref 0.0–2.0)
Bicarbonate: 25.4 mmol/L (ref 20.0–28.0)
O2 Saturation: 77.9 %
Patient temperature: 37
pCO2, Ven: 44 mmHg (ref 44–60)
pH, Ven: 7.37 (ref 7.25–7.43)
pO2, Ven: 47 mmHg — ABNORMAL HIGH (ref 32–45)

## 2024-01-02 LAB — RESPIRATORY PANEL BY PCR

## 2024-01-02 LAB — BASIC METABOLIC PANEL WITH GFR
Anion gap: 11 (ref 5–15)
BUN: 15 mg/dL (ref 8–23)
CO2: 23 mmol/L (ref 22–32)
Calcium: 9.9 mg/dL (ref 8.9–10.3)
Chloride: 104 mmol/L (ref 98–111)
Creatinine, Ser: 0.61 mg/dL (ref 0.44–1.00)
GFR, Estimated: >60 mL/min
Glucose, Bld: 183 mg/dL — ABNORMAL HIGH (ref 70–99)
Potassium: 4.4 mmol/L (ref 3.5–5.1)
Sodium: 138 mmol/L (ref 135–145)

## 2024-01-02 LAB — LIPASE, BLOOD: Lipase: 12 U/L (ref 11–51)

## 2024-01-02 LAB — FOLATE: Folate: 6.3 ng/mL

## 2024-01-02 LAB — PRO BRAIN NATRIURETIC PEPTIDE: Pro Brain Natriuretic Peptide: 80.6 pg/mL

## 2024-01-02 LAB — TSH: TSH: 0.542 u[IU]/mL (ref 0.350–4.500)

## 2024-01-02 LAB — PROCALCITONIN: Procalcitonin: <0.1 ng/mL

## 2024-01-02 LAB — CBG MONITORING, ED
Glucose-Capillary: 260 mg/dL — ABNORMAL HIGH (ref 70–99)
Glucose-Capillary: 305 mg/dL — ABNORMAL HIGH (ref 70–99)

## 2024-01-02 LAB — TROPONIN T, HIGH SENSITIVITY
Troponin T High Sensitivity: 18 ng/L (ref 0–19)
Troponin T High Sensitivity: 19 ng/L (ref 0–19)

## 2024-01-02 LAB — I-STAT CG4 LACTIC ACID, ED
Lactic Acid, Venous: 1.1 mmol/L (ref 0.5–1.9)
Lactic Acid, Venous: 1.2 mmol/L (ref 0.5–1.9)

## 2024-01-02 LAB — GLUCOSE, CAPILLARY
Glucose-Capillary: 273 mg/dL — ABNORMAL HIGH (ref 70–99)
Glucose-Capillary: 271 mg/dL — ABNORMAL HIGH (ref 70–99)

## 2024-01-02 LAB — VITAMIN B12: Vitamin B-12: 507 pg/mL (ref 180–914)

## 2024-01-02 MED ORDER — ASPIRIN 81 MG PO TBEC
81.0000 mg | DELAYED_RELEASE_TABLET | Freq: Every day | ORAL | Status: DC
  Administered 2024-01-03 – 2024-01-09 (×7): 81 mg via ORAL
  Filled 2024-01-02 (×7): qty 1

## 2024-01-02 MED ORDER — IPRATROPIUM-ALBUTEROL 0.5-2.5 (3) MG/3ML IN SOLN
3.0000 mL | Freq: Three times a day (TID) | RESPIRATORY_TRACT | Status: DC
  Administered 2024-01-03: 3 mL via RESPIRATORY_TRACT
  Filled 2024-01-02: qty 3

## 2024-01-02 MED ORDER — LEVOFLOXACIN IN D5W 250 MG/50ML IV SOLN
250.0000 mg | INTRAVENOUS | Status: DC
  Administered 2024-01-02 – 2024-01-03 (×2): 250 mg via INTRAVENOUS
  Filled 2024-01-02 (×2): qty 50

## 2024-01-02 MED ORDER — ONDANSETRON HCL 4 MG/2ML IJ SOLN
4.0000 mg | Freq: Four times a day (QID) | INTRAMUSCULAR | Status: DC | PRN
  Administered 2024-01-02: 4 mg via INTRAVENOUS
  Filled 2024-01-02: qty 2

## 2024-01-02 MED ORDER — IPRATROPIUM-ALBUTEROL 0.5-2.5 (3) MG/3ML IN SOLN
3.0000 mL | Freq: Once | RESPIRATORY_TRACT | Status: AC
  Administered 2024-01-02: 3 mL via RESPIRATORY_TRACT
  Filled 2024-01-02: qty 3

## 2024-01-02 MED ORDER — ACETAMINOPHEN 500 MG PO TABS: 1000.0000 mg | ORAL_TABLET | Freq: Three times a day (TID) | ORAL | Status: DC | PRN

## 2024-01-02 MED ORDER — ALPRAZOLAM 0.25 MG PO TABS
0.2500 mg | ORAL_TABLET | Freq: Two times a day (BID) | ORAL | Status: DC | PRN
  Administered 2024-01-02 – 2024-01-07 (×4): 0.25 mg via ORAL
  Filled 2024-01-02 (×4): qty 1

## 2024-01-02 MED ORDER — HEPARIN SODIUM (PORCINE) 5000 UNIT/ML IJ SOLN
5000.0000 [IU] | Freq: Three times a day (TID) | INTRAMUSCULAR | Status: DC
  Administered 2024-01-02 – 2024-01-09 (×19): 5000 [IU] via SUBCUTANEOUS
  Filled 2024-01-02 (×21): qty 1

## 2024-01-02 MED ORDER — IPRATROPIUM-ALBUTEROL 0.5-2.5 (3) MG/3ML IN SOLN
3.0000 mL | Freq: Four times a day (QID) | RESPIRATORY_TRACT | Status: DC
  Administered 2024-01-02 (×4): 3 mL via RESPIRATORY_TRACT
  Filled 2024-01-02 (×5): qty 3

## 2024-01-02 MED ORDER — IOHEXOL 350 MG/ML SOLN
100.0000 mL | Freq: Once | INTRAVENOUS | Status: AC | PRN
  Administered 2024-01-02: 100 mL via INTRAVENOUS

## 2024-01-02 MED ORDER — DEXTROSE IN LACTATED RINGERS 5 % IV SOLN: INTRAVENOUS | Status: DC

## 2024-01-02 MED ORDER — INSULIN GLARGINE 100 UNIT/ML ~~LOC~~ SOLN
10.0000 [IU] | Freq: Every day | SUBCUTANEOUS | Status: DC
  Administered 2024-01-02 – 2024-01-06 (×5): 10 [IU] via SUBCUTANEOUS
  Filled 2024-01-02 (×5): qty 0.1

## 2024-01-02 MED ORDER — BUDESONIDE 0.25 MG/2ML IN SUSP
0.2500 mg | Freq: Two times a day (BID) | RESPIRATORY_TRACT | Status: DC
  Administered 2024-01-02 – 2024-01-08 (×14): 0.25 mg via RESPIRATORY_TRACT
  Filled 2024-01-02 (×14): qty 2

## 2024-01-02 MED ORDER — SODIUM CHLORIDE 0.9 % IV SOLN
2.0000 g | INTRAVENOUS | Status: DC
  Administered 2024-01-02: 2 g via INTRAVENOUS
  Filled 2024-01-02: qty 20

## 2024-01-02 MED ORDER — SODIUM CHLORIDE 0.9 % IV SOLN: INTRAVENOUS | Status: AC

## 2024-01-02 MED ORDER — QUETIAPINE FUMARATE 25 MG PO TABS
12.5000 mg | ORAL_TABLET | Freq: Every day | ORAL | Status: DC
  Administered 2024-01-02 – 2024-01-03 (×2): 12.5 mg via ORAL
  Filled 2024-01-02 (×2): qty 1

## 2024-01-02 MED ORDER — ACETAMINOPHEN 650 MG RE SUPP
650.0000 mg | RECTAL | Status: DC | PRN
  Administered 2024-01-02: 650 mg via RECTAL
  Filled 2024-01-02: qty 1

## 2024-01-02 MED ORDER — ATORVASTATIN CALCIUM 40 MG PO TABS
40.0000 mg | ORAL_TABLET | Freq: Every day | ORAL | Status: DC
  Administered 2024-01-03 – 2024-01-09 (×7): 40 mg via ORAL
  Filled 2024-01-02 (×7): qty 1

## 2024-01-02 MED ORDER — INSULIN ASPART 100 UNIT/ML IJ SOLN
0.0000 [IU] | Freq: Three times a day (TID) | INTRAMUSCULAR | Status: DC
  Administered 2024-01-02: 3 [IU] via SUBCUTANEOUS
  Administered 2024-01-02: 4 [IU] via SUBCUTANEOUS
  Administered 2024-01-02: 3 [IU] via SUBCUTANEOUS
  Administered 2024-01-03: 4 [IU] via SUBCUTANEOUS
  Administered 2024-01-03: 3 [IU] via SUBCUTANEOUS
  Administered 2024-01-03 – 2024-01-04 (×2): 2 [IU] via SUBCUTANEOUS
  Administered 2024-01-04 – 2024-01-05 (×3): 1 [IU] via SUBCUTANEOUS
  Administered 2024-01-05 (×2): 2 [IU] via SUBCUTANEOUS
  Administered 2024-01-06: 3 [IU] via SUBCUTANEOUS
  Administered 2024-01-06: 1 [IU] via SUBCUTANEOUS
  Administered 2024-01-06 – 2024-01-07 (×2): 3 [IU] via SUBCUTANEOUS
  Administered 2024-01-07: 1 [IU] via SUBCUTANEOUS
  Administered 2024-01-07: 3 [IU] via SUBCUTANEOUS
  Administered 2024-01-08 (×2): 2 [IU] via SUBCUTANEOUS
  Administered 2024-01-08 – 2024-01-09 (×2): 1 [IU] via SUBCUTANEOUS
  Administered 2024-01-09: 4 [IU] via SUBCUTANEOUS
  Filled 2024-01-02: qty 1
  Filled 2024-01-02: qty 3
  Filled 2024-01-02: qty 4
  Filled 2024-01-02: qty 3
  Filled 2024-01-02: qty 1
  Filled 2024-01-02: qty 3
  Filled 2024-01-02 (×2): qty 2
  Filled 2024-01-02: qty 1
  Filled 2024-01-02: qty 4
  Filled 2024-01-02: qty 1
  Filled 2024-01-02: qty 3
  Filled 2024-01-02: qty 1
  Filled 2024-01-02: qty 3
  Filled 2024-01-02: qty 4
  Filled 2024-01-02: qty 1
  Filled 2024-01-02 (×2): qty 2
  Filled 2024-01-02: qty 3
  Filled 2024-01-02 (×2): qty 2
  Filled 2024-01-02: qty 1
  Filled 2024-01-02: qty 2

## 2024-01-02 MED ORDER — PREDNISONE 20 MG PO TABS: 40.0000 mg | ORAL_TABLET | Freq: Every day | ORAL | Status: DC

## 2024-01-02 MED ORDER — METHYLPREDNISOLONE SODIUM SUCC 40 MG IJ SOLR
40.0000 mg | Freq: Two times a day (BID) | INTRAMUSCULAR | Status: AC
  Administered 2024-01-02 (×2): 40 mg via INTRAVENOUS
  Filled 2024-01-02 (×2): qty 1

## 2024-01-02 MED ORDER — LISINOPRIL 20 MG PO TABS
20.0000 mg | ORAL_TABLET | Freq: Every day | ORAL | Status: DC
  Administered 2024-01-03 – 2024-01-09 (×6): 20 mg via ORAL
  Filled 2024-01-02 (×7): qty 1

## 2024-01-02 MED ORDER — INSULIN GLARGINE-YFGN 100 UNIT/ML ~~LOC~~ SOPN: 10.0000 [IU] | PEN_INJECTOR | SUBCUTANEOUS | Status: DC

## 2024-01-02 MED ORDER — ARFORMOTEROL TARTRATE 15 MCG/2ML IN NEBU
15.0000 ug | INHALATION_SOLUTION | Freq: Two times a day (BID) | RESPIRATORY_TRACT | Status: DC
  Administered 2024-01-02 – 2024-01-08 (×14): 15 ug via RESPIRATORY_TRACT
  Filled 2024-01-02 (×14): qty 2

## 2024-01-02 NOTE — Progress Notes (Addendum)
 73 year old with dementia, type 2 diabetes, hypertension, hyperlipidemia, previous stroke, COPD, heart failure with preserved ejection fraction, aortic mural thrombus, history of GI bleed and recurrent fall who was admitted to the hospital 12/14-12/18/25 with altered mental status apparently secondary to Citrobacter UTI.  She was thought to be encephalopathic from Xanax , Seroquel  also.  Patient was discharged yesterday afternoon to skilled nursing facility, reportedly they did not take her admission because she was hypoxic and altered.  She was sent back to the hospital, brought to [facility].  On arrival to the emergency room, patient was arousable but unable to give any history.  Blood pressure stable.  Oxygen stable on 2 L oxygen supplementation.  WBC count 21.8 with normal WBC count in the morning before discharge. Blood gas and lites is fairly stable. Electrolytes are adequate. Troponins are normal. CT scan abdomen pelvis without any acute abnormalities. CT chest without any acute abnormality, known mural thrombus. Respiratory virus panel, RSV and flu negative. Blood glucose adequate. Admitted due to altered mentation, possible aspiration pneumonia, hypoxemia.   Altered mental status in a patient with known cognitive decline, recently hospitalized. UTI with Citrobacter.  Currently no focal deficits. Respiratory  virus panel negative. CT head not done-will do MRI of the brain to rule out any subtle stroke. No evidence of CO2 narcosis. No evidence of consolidation N.p.o. due to inadequate arousable, continue maintenance IV fluid, discontinue all sedatives and Seroquel . Repeat blood cultures and procalcitonin, continue Rocephin  for now.  Provide all supportive care. Remains full code.  Patient likely needs long-term nursing home placement, no family or next of kin available. Consult palliative care team.  Social worker to follow-up. Will check TSH, B12, folic acid , a.m. cortisol  levels.   Patient was reexamined in the afternoon.  Patient is alert awake and mostly oriented.  She tells me that she had some breathing issues show rehab sent her back.  Patient is aware about her hospitalization last week at [facility]. Will start patient on Seroquel  12.5 mg nightly Xanax  0.25 mg twice daily as needed for anxiety that she takes for a long time. Work with the speech, PT OT. Case management, refer back to SNF for rehab.     Total time spent: 50 minutes.  Same-day admit.  No charge visit.

## 2024-01-02 NOTE — Plan of Care (Signed)
" °  Problem: SLP Dysphagia Goals Goal: Misc Dysphagia Goal Flowsheets (Taken 01/02/2024 1433) Misc Dysphagia Goal: Pt will tolerate the least restrictive diet with no overt or subtle s/s of aspiration or decline in pulmonary status.   "

## 2024-01-02 NOTE — Inpatient Diabetes Management (Signed)
 Inpatient Diabetes Program Recommendations  AACE/ADA: New Consensus Statement on Inpatient Glycemic Control (2015)  Target Ranges:  Prepandial:   less than 140 mg/dL      Peak postprandial:   less than 180 mg/dL (1-2 hours)      Critically ill patients:  140 - 180 mg/dL   Lab Results  Component Value Date   GLUCAP 305 (H) 01/02/2024   HGBA1C 6.9 (H) 09/20/2023    Review of Glycemic Control  Latest Reference Range & Units 01/02/24 07:34 01/02/24 11:46  Glucose-Capillary 70 - 99 mg/dL 739 (H) 694 (H)  (H): Data is abnormally high  Diabetes history: DM2 Outpatient Diabetes medications: Metformin  500 mg at bedtime, Tresiba  15 units QD Current orders for Inpatient glycemic control:  Novolog  0-6 units TID, Solumedrol 40 mg Q12H  Inpatient Diabetes Program Recommendations:    Please consider Semglee  10 units QD  Thank you, Wyvonna Pinal, MSN, CDCES Diabetes Coordinator Inpatient Diabetes Program [phone] (team pager from 8a-5p)

## 2024-01-02 NOTE — Progress Notes (Signed)
 Pt taken off Venti Mask and placed on 5L [place] after neb tx.  Sats are 97% on [place]. RN updated.

## 2024-01-02 NOTE — H&P (Signed)
 " History and Physical    DUHA ABAIR FMW:[id number] DOB: 1950-12-14 DOA: 01/01/2024  PCP: Valma Carwin, MD Patient coming from: Home  Chief Complaint: shortness of breath  HPI: Allison Hughes is a 73 y.o. female with medical history significant of  Dm type 2, HLD, HTN, CVA, COPD and HFpEF aortic mural thrombus, mild cognitive impairment, GI bleed and recurrent falls who presents to the ER with complaint of shortness of breath.  Patient is a poor historian appears altered.  Arouses easily but does not give much information.  Patient was recently admitted to the hospital and discharged yesterday with chronic heart failure preserved EF, acute cystitis and hematuria generalized weakness and failure to thrive.  Patient was found to have generalized weakness was thought to be secondary to urinary tract infection she was treated with IV antibiotics and subsequently transition to Bactrim  which she did well on prior to discharge.  There was concern that patient's alteration of mental status and confusion was not only secondary to infection but likely secondary to medication as well.  Xanax  was reduced as well as quetiapine  patient was taking at home.  Patient arrived at rehab facility this evening and was noted to have a saturation of 88% and was subsequently returned to the hospital for further evaluation and treatment.    ED Course:  In the ER, BP 137/83, HR, RR, O2 saturation,  and Tmax. Cbc demonstrated wbc,  hb/hct, and platelet. Chemistry demonstrated Na 138 , K 4.4, Cl 104, bicarb 23, Bun/Cr  15/0.61 and glucose 183.  Initial troponin was 18 and 19.  CT abdomen pelvis demonstrated diverticulosis without diverticulitis.  Stable left adrenal adenoma.  Cholelithiasis without complicating factors.  CT chest demonstrated no evidence of pulmonary embolism.  Mural thrombus within the descending thoracic aorta, without dissection.    Review of Systems:  All systems reviewed and apart from history  of presenting illness, are negative.  Past Medical History:  Diagnosis Date   Acute metabolic encephalopathy 10/20/2023   Acute pyelonephritis 02/02/2016   Acute respiratory failure with hypoxia (HCC) 09/20/2023   Diabetes mellitus 01/15/2004   T2DM   Hyperlipidemia    Hypertension    Obesity (BMI 30-39.9)    Pneumonia 09/06/2020   Sepsis (HCC) 02/02/2016   Stroke (HCC)    Urinary tract infection 10/20/2023    Past Surgical History:  Procedure Laterality Date   CESAREAN SECTION     LOOP RECORDER INSERTION N/A 09/08/2020   Procedure: LOOP RECORDER INSERTION;  Surgeon: Inocencio Soyla Lunger, MD;  Location: [facility];  Service: Cardiovascular;  Laterality: N/A;   ORIF ANKLE FRACTURE  05/11/2011   Procedure: OPEN REDUCTION INTERNAL FIXATION (ORIF) ANKLE FRACTURE;  Surgeon: Jerona LULLA Sage, MD;  Location: [facility] OR;  Service: Orthopedics;  Laterality: Right;   TUBAL LIGATION       reports that she has been smoking cigarettes. She has a 40 pack-year smoking history. She quit smokeless tobacco use about 12 years ago. She reports that she does not drink alcohol and does not use drugs.  Allergies[1]  Family History  Problem Relation Age of Onset   Drug abuse Daughter    Diabetes Daughter     Prior to Admission medications  Medication Sig Start Date End Date Taking? Authorizing Provider  acetaminophen  (TYLENOL ) 500 MG tablet Take 2 tablets (1,000 mg total) by mouth every 8 (eight) hours as needed for mild pain (pain score 1-3) or headache. 09/23/23   Kathrin Mignon DASEN, MD  albuterol  (VENTOLIN  HFA) 108 (90 Base) MCG/ACT inhaler Inhale 2 puffs into the lungs every 6 (six) hours as needed for wheezing or shortness of breath. 06/06/22   Ghimire, Donalda HERO, MD  ALPRAZolam  (XANAX ) 0.5 MG tablet Take 1 tablet (0.5 mg total) by mouth at bedtime. 01/01/24   Danford, Lonni SQUIBB, MD  aspirin  EC 81 MG tablet Take 1 tablet (81 mg total) by mouth daily. Swallow whole. 12/05/23   Patsy Lenis, MD   atorvastatin  (LIPITOR) 40 MG tablet Take 40 mg by mouth daily. 08/06/18   [provider]  Fluticasone -Umeclidin-Vilant (TRELEGY ELLIPTA) 100-62.5-25 MCG/ACT AEPB Inhale 1 puff into the lungs daily at 12 noon. Patient not taking: Reported on 12/01/2023    [provider]  lisinopril  (ZESTRIL ) 20 MG tablet Take 20 mg by mouth daily.    [provider]  Melatonin 3 MG CAPS Take 1 capsule (3 mg total) by mouth at bedtime. 01/01/24   Danford, Lonni SQUIBB, MD  metFORMIN  (GLUCOPHAGE ) 500 MG tablet Take 500 mg by mouth at bedtime. 11/26/23   [provider]  pantoprazole  (PROTONIX ) 40 MG tablet Take 40 mg by mouth daily.    [provider]  QUEtiapine  (SEROQUEL ) 25 MG tablet Take 1-2 tablets (25-50 mg total) by mouth 2 (two) times daily. Take 25 mg in morning and 50 mg at night 01/01/24   Danford, Lonni SQUIBB, MD  sulfamethoxazole -trimethoprim  (BACTRIM  DS) 800-160 MG tablet Take 1 tablet by mouth every 12 (twelve) hours. 01/01/24   Danford, Lonni SQUIBB, MD  TRESIBA  100 UNIT/ML SOLN Inject 15 Units into the skin at bedtime. 09/23/23   Gonfa, Taye T, MD    Physical Exam: Vitals:   01/01/24 2320 01/01/24 2327 01/02/24 0145  BP: (!) 145/68  127/71  Pulse: 96  92  Resp: (!) 21  18  Temp: 98.4 F (36.9 C)    TempSrc: Oral    SpO2: (!) 89% 90% 94%    Physical Exam Constitutional:      General: He is not in acute distress.    Appearance: Normal appearance.  HENT:     Head: Normocephalic and atraumatic.  Eyes:     Extraocular Movements: Extraocular movements intact.     Conjunctiva/sclera: Conjunctivae normal.     Pupils: Pupils are equal, round, and reactive to light.  Cardiovascular:     Rate and Rhythm: Normal rate and regular rhythm.     Pulses: Normal pulses.     Heart sounds: Normal heart sounds.  Pulmonary:     Effort: Pulmonary effort is normal. No respiratory distress.     Breath sounds: no expiratory wheezing and a little diminished   Abdominal:     General: Abdomen is flat. Bowel sounds are normal. There is no distension.     Palpations: Abdomen is soft.     Tenderness: There is no abdominal tenderness.  Musculoskeletal:        General: No deformity. Normal range of motion.  Skin:    General: Skin is warm and dry.     Coloration: Skin is not jaundiced.  Neurological:     General: No focal deficit present.     Mental Status:  Arouses easily but not able to answer questions Labs on Admission: I have personally reviewed following labs and imaging studies  CBC: Recent Labs  Lab 12/28/23 1815 12/29/23 0424 12/30/23 0452 12/31/23 0416 01/01/24 0504 01/01/24 2355  WBC 10.6* 8.6 6.4 6.0 9.0 21.8*  NEUTROABS 8.2*  --   --   --   --  20.6*  HGB 11.7* 11.0* 10.9* 10.7* 11.7* 12.1  HCT 38.1 35.2* 35.2* 33.4* 36.3 39.1  MCV 79.5* 80.7 78.9* 78.6* 77.7* 79.6*  PLT 351 333 360 334 369 429*   Basic Metabolic Panel: Recent Labs  Lab 12/29/23 0424 12/30/23 0452 12/31/23 0416 01/01/24 0504 01/01/24 2355  NA 141 143 142 139 138  K 3.5 3.2* 4.4 4.3 4.4  CL 108 110 111 106 104  CO2 26 27 23 23 23   GLUCOSE 132* 100* 113* 141* 183*  BUN 13 8 9 9 15   CREATININE 0.49 0.49 0.53 0.66 0.61  CALCIUM  8.8* 8.7* 9.4 10.0 9.9   GFR: Estimated Creatinine Clearance: 61.8 mL/min (by C-G formula based on SCr of 0.61 mg/dL). Liver Function Tests: Recent Labs  Lab 12/28/23 1815  AST 15  ALT 13  ALKPHOS 123  BILITOT 0.5  PROT 6.0*  ALBUMIN 2.8*   Recent Labs  Lab 01/01/24 2355  LIPASE 12   No results for input(s): AMMONIA in the last 168 hours. Coagulation Profile: No results for input(s): INR, PROTIME in the last 168 hours. Cardiac Enzymes: Recent Labs  Lab 12/28/23 1815  CKTOTAL 46   BNP (last 3 results) Recent Labs    01/01/24 2355  PROBNP 80.6   HbA1C: No results for input(s): HGBA1C in the last 72 hours. CBG: Recent Labs  Lab 12/31/23 2034 01/01/24 0829 01/01/24 1106 01/01/24 1759  01/01/24 2029  GLUCAP 165* 197* 193* 205* 168*   Lipid Profile: No results for input(s): CHOL, HDL, LDLCALC, TRIG, CHOLHDL, LDLDIRECT in the last 72 hours. Thyroid Function Tests: No results for input(s): TSH, T4TOTAL, FREET4, T3FREE, THYROIDAB in the last 72 hours. Anemia Panel: No results for input(s): VITAMINB12, FOLATE, FERRITIN, TIBC, IRON , RETICCTPCT in the last 72 hours. Urine analysis:    Component Value Date/Time   COLORURINE YELLOW 12/28/2023 1941   APPEARANCEUR TURBID (A) 12/28/2023 1941   LABSPEC 1.016 12/28/2023 1941   PHURINE 5.0 12/28/2023 1941   GLUCOSEU 50 (A) 12/28/2023 1941   HGBUR SMALL (A) 12/28/2023 1941   BILIRUBINUR NEGATIVE 12/28/2023 1941   KETONESUR NEGATIVE 12/28/2023 1941   PROTEINUR 100 (A) 12/28/2023 1941   NITRITE POSITIVE (A) 12/28/2023 1941   LEUKOCYTESUR LARGE (A) 12/28/2023 1941    Radiological Exams on Admission: CT ABDOMEN PELVIS W CONTRAST Result Date: 01/02/2024 EXAM: CT ABDOMEN AND PELVIS WITH CONTRAST 01/02/2024 01:18:15 AM TECHNIQUE: CT of the abdomen and pelvis was performed with the administration of 100 mL of iohexol  (OMNIPAQUE ) 350 MG/ML injection. Multiplanar reformatted images are provided for review. Automated exposure control, iterative reconstruction, and/or weight-based adjustment of the mA/kV was utilized to reduce the radiation dose to as low as reasonably achievable. COMPARISON: None available. CLINICAL HISTORY: Bowel obstruction suspected. FINDINGS: LOWER CHEST: No acute abnormality. LIVER: The liver is unremarkable. GALLBLADDER AND BILE DUCTS: Cholelithiasis is noted without evidence of complicating factors. No biliary ductal dilatation. SPLEEN: No acute abnormality. PANCREAS: No acute abnormality. ADRENAL GLANDS: Left adrenal lesion is noted to be stable over multiple previous exams, most consistent with adenoma. KIDNEYS, URETERS AND BLADDER: Kidneys show no focal mass or obstructive changes. No  calculi are noted. No perinephric or periureteral stranding. The bladder is partially distended. GI AND BOWEL: Stomach demonstrates no acute abnormality. The bowel is unremarkable. No obstructive or inflammatory changes of the colon are seen. Mild diverticular changes noted. The appendix is not well visualized. No inflammatory changes to suggest appendicitis are noted. There is no bowel obstruction. PERITONEUM AND RETROPERITONEUM: No free fluid  is seen. No free air. VASCULATURE: Aorta is normal in caliber. Aortic calcifications are seen without aneurysmal dilatation. LYMPH NODES: No lymphadenopathy. REPRODUCTIVE ORGANS: The uterus is within normal limits. BONES AND SOFT TISSUES: The bony structures are within normal limits with the exception of bilateral L5 pars defects. No focal soft tissue abnormality. IMPRESSION: 1. Diverticulosis without diverticulitis. 2. Stable left adrenal adenoma. 3. Cholelithiasis without complicating factors. Electronically signed by: Oneil Devonshire MD 01/02/2024 01:30 AM EST RP Workstation: [id number]   CT Angio Chest PE W and/or Wo Contrast Result Date: 01/02/2024 EXAM: CTA CHEST 01/02/2024 01:18:15 AM TECHNIQUE: CTA of the chest was performed after the administration of intravenous contrast. 100 ml omnipaque  315 . Multiplanar reformatted images are provided for review. MIP images are provided for review. Automated exposure control, iterative reconstruction, and/or weight based adjustment of the mA/kV was utilized to reduce the radiation dose to as low as reasonably achievable. COMPARISON: None available. CLINICAL HISTORY: Decreased oxygen saturation FINDINGS: PULMONARY ARTERIES: The pulmonary artery shows a normal branching pattern without evidence of intraluminal filling defect to suggest pulmonary embolism. Main pulmonary artery is normal in caliber. MEDIASTINUM: The heart is not significantly enlarged in size. The pericardium demonstrates no acute abnormality. The thoracic aorta  shows atherosclerotic calcifications. No aneurysmal dilatation is seen. Mural thrombus is noted within the descending thoracic aorta. No dissection is seen. The thoracic inlet is within normal limits. No hilar or mediastinal edema is noted. The esophagus, as visualized, is within normal limits. LYMPH NODES: No mediastinal, hilar or axillary lymphadenopathy. LUNGS AND PLEURA: The lungs demonstrate bibasilar atelectatic changes. Mild emphysematous changes are seen as well. No focal infiltrate or effusion is noted. No parenchymal nodules are seen. No pneumothorax. UPPER ABDOMEN: Limited images of the upper abdomen are unremarkable. SOFT TISSUES AND BONES: The bony structures show degenerative change of the thoracic spine. No acute soft tissue abnormality. IMPRESSION: 1. No evidence of pulmonary embolism. 2. Mural thrombus within the descending thoracic aorta, without dissection. Electronically signed by: Oneil Devonshire MD 01/02/2024 01:27 AM EST RP Workstation: [id number]    EKG: Independently reviewed.   Assessment/Plan Active Problems:   Acute respiratory failure with hypoxia (HCC)   Acute respiratory failure with hypoxia Most likely secondary to COPD Patient currently requiring 8l [place] Patient will be placed on IV steroids patient has been started on methyl prednisone 40 every 12 and will transition to prednisone Patient will receiving breathing treatments( DuoNeb every 4 hours and budesonide , Brovana ) Blood gas did not demonstrated significant respiratory acidosis  Citrobacter UTI Patient had another 4 days of Bactrim  In addition to IV Rocephin  as she is too lethargic to take anything orally  Hx of dementia Patient normally takes quetiapine  and the doses were decreased to 25 mg in the morning and 50 at night And patient is very lethargic will hold off on these medications   Type 2 diabetes Patient will be started on low dose sliding scale insulin  Will add basal dose insulin  as  needed   Chronic diastolic heart failure Hypertension as well Noted to be stable on lisinopril  and will be continued at this time Does not appear to be in florid heart failure at this time that she stable  History of mural thrombus  Was noted on CT and appears to be a part of her history Patient was on aspirin  for the same We will continue at this time  Anemia stable   DVT prophylaxis:  lovenox  Code Status: full  Family Communication:   Disposition Plan:  inpatient   Consults called:  Admission status: inpatient Level of care: Level of care:  The medical decision making on this patient was of high complexity and the patient is at high risk for clinical deterioration, therefore this is a level 3 visit.  Bradly MARLA Drones MD [facility]  If 7PM-7AM, please contact night-coverage [email or web address]  01/02/2024, 2:40 AM       [1]  Allergies Allergen Reactions   Iodine Swelling    Not allergic to IV contrast [facility]   Shrimp [Shellfish Allergy] Swelling    Fresh shrimp   "

## 2024-01-02 NOTE — Progress Notes (Signed)
 DISCHARGE NOTE SNF Allison Hughes to be discharged Rehab at [facility] per MD order.   Alert to self follows simple commands Skin clean, dry and intact without evidence of skin break down, no evidence of skin tears noted. IV catheter discontinued intact. Site without signs and symptoms of complications.  Pt denies pain at the site currently. No complaints noted.pt vital signs BP 109/60, pulse 94, map 73, 19 resp, 92% sats on room air. PT shows no sign of SOB or complains of respiratory distress.  Patient free of lines, drains, and wounds.  Discharge packet assembled. An After Visit Summary (AVS) was printed and given to the [facility] personnel. Patient escorted via stretcher and discharged to [facility] and rehab via ambulance. Report called to accepting facility by first shift nurse; all questions and concerns addressed.  Channing Na RN

## 2024-01-02 NOTE — Evaluation (Signed)
 Clinical/Bedside Swallow Evaluation Patient Details  Name: Allison Hughes MRN: [id number] Date of Birth: 06/24/1950  Today's Date: 01/02/2024 Time: SLP Start Time (ACUTE ONLY): 1400 SLP Stop Time (ACUTE ONLY): 1415 SLP Time Calculation (min) (ACUTE ONLY): 15 min  Past Medical History:  Past Medical History:  Diagnosis Date   Acute metabolic encephalopathy 10/20/2023   Acute pyelonephritis 02/02/2016   Acute respiratory failure with hypoxia (HCC) 09/20/2023   Diabetes mellitus 01/15/2004   T2DM   Hyperlipidemia    Hypertension    Obesity (BMI 30-39.9)    Pneumonia 09/06/2020   Sepsis (HCC) 02/02/2016   Stroke (HCC)    Urinary tract infection 10/20/2023   Past Surgical History:  Past Surgical History:  Procedure Laterality Date   CESAREAN SECTION     LOOP RECORDER INSERTION N/A 09/08/2020   Procedure: LOOP RECORDER INSERTION;  Surgeon: Inocencio Soyla Lunger, MD;  Location: [facility];  Service: Cardiovascular;  Laterality: N/A;   ORIF ANKLE FRACTURE  05/11/2011   Procedure: OPEN REDUCTION INTERNAL FIXATION (ORIF) ANKLE FRACTURE;  Surgeon: Jerona LULLA Sage, MD;  Location: [facility] OR;  Service: Orthopedics;  Laterality: Right;   TUBAL LIGATION     HPI:  Allison Hughes is a 73 y.o. female with medical history significant of  Dm type 2, HLD, HTN, CVA, COPD and HFpEF aortic mural thrombus, mild cognitive impairment, GI bleed and recurrent falls who presents to the ER with complaint of shortness of breath. SLP consulted for bedside swallow assessment.    Assessment / Plan / Recommendation  Clinical Impression   Pt presents with a mild oral dysphagia per clinical swallow assessment completed today, likely in the setting of deconditioning due to acute illness.  Pharyngeal swallow initiation appeared timely with laryngeal elevation noted. Observed x1 instance of delayed, dry cough following thin liquid wash that followed dry solid trial. Unclear if cough was related to PO intake.    Recommend starting a mechanical soft diet and thin liquids as tolerated. PO meds as tolerated.    Plan: SLP will follow up to assess diet tolerance and modify or advance diet as indicated. Continue SLP PoC.   SLP Visit Diagnosis: Dysphagia, oral phase (R13.11)    Aspiration Risk  Mild aspiration risk    Diet Recommendation Dysphagia 3 (Mech soft);Thin liquid    Liquid Administration via: Cup;Straw Medication Administration:  (as tolerated) Supervision: Staff to assist with self feeding;Full supervision/cueing for compensatory strategies Compensations: Slow rate;Small sips/bites Postural Changes: Seated upright at 90 degrees    Other Recommendations Oral Care Recommendations: Oral care BID      Assistance Recommended at Discharge  Defer to PT/OT   Functional Status Assessment Patient has had a recent decline in their functional status and demonstrates the ability to make significant improvements in function in a reasonable and predictable amount of time.  Frequency and Duration min 1 x/week  2 weeks       Prognosis Prognosis for improved oropharyngeal function: Good      Swallow Study   General Date of Onset: 01/02/24 HPI: Allison Hughes is a 73 y.o. female with medical history significant of  Dm type 2, HLD, HTN, CVA, COPD and HFpEF aortic mural thrombus, mild cognitive impairment, GI bleed and recurrent falls who presents to the ER with complaint of shortness of breath. SLP consulted for bedside swallow assessment. Type of Study: Bedside Swallow Evaluation Previous Swallow Assessment: MBS completed in August 2025 with recommendation for mechanical soft diet and thin liquids. Diet  Prior to this Study: NPO Temperature Spikes Noted: No Respiratory Status: Venti-mask (5L) History of Recent Intubation: No Behavior/Cognition: Alert;Cooperative;Requires cueing Oral Cavity Assessment: Within Functional Limits Oral Care Completed by SLP: No Oral Cavity - Dentition: Dentures,  top;Dentures, bottom;Edentulous Vision:  (not assessed) Self-Feeding Abilities: Total assist Patient Positioning: Upright in bed Baseline Vocal Quality: Low vocal intensity Volitional Cough: Cognitively unable to elicit Volitional Swallow: Unable to elicit    Oral/Motor/Sensory Function Overall Oral Motor/Sensory Function:  (limited assessment; generalized oral weakness observed)   Ice Chips Ice chips: Within functional limits   Thin Liquid Thin Liquid: Impaired Other Comments: x1 delayed cough episode following thin liquid wash post regular trial    Nectar Thick Nectar Thick Liquid: Not tested   Honey Thick Honey Thick Liquid: Not tested   Puree Puree: Within functional limits Presentation: Spoon   Solid     Solid: Impaired Oral Phase Functional Implications: Prolonged oral transit      Peyton Allison Hughes 01/02/2024,2:42 PM

## 2024-01-02 NOTE — ED Notes (Addendum)
 Patient is confused, slow to answer and follow direction. Airway patent, respirations tachypneic and unlabored. Skin normal, warm and dry.

## 2024-01-02 NOTE — ED Notes (Signed)
 Per provider, if respiratory swab is positive, d/c the MRI

## 2024-01-03 DIAGNOSIS — Z7189 Other specified counseling: Secondary | ICD-10-CM

## 2024-01-03 DIAGNOSIS — J9601 Acute respiratory failure with hypoxia: Secondary | ICD-10-CM | POA: Diagnosis not present

## 2024-01-03 DIAGNOSIS — Z515 Encounter for palliative care: Secondary | ICD-10-CM

## 2024-01-03 LAB — CORTISOL-AM, BLOOD: Cortisol - AM: 6.1 ug/dL — ABNORMAL LOW (ref 6.7–22.6)

## 2024-01-03 LAB — GLUCOSE, CAPILLARY
Glucose-Capillary: 209 mg/dL — ABNORMAL HIGH (ref 70–99)
Glucose-Capillary: 302 mg/dL — ABNORMAL HIGH (ref 70–99)
Glucose-Capillary: 276 mg/dL — ABNORMAL HIGH (ref 70–99)
Glucose-Capillary: 222 mg/dL — ABNORMAL HIGH (ref 70–99)

## 2024-01-03 MED ORDER — IPRATROPIUM-ALBUTEROL 0.5-2.5 (3) MG/3ML IN SOLN
3.0000 mL | Freq: Two times a day (BID) | RESPIRATORY_TRACT | Status: DC
  Administered 2024-01-03: 3 mL via RESPIRATORY_TRACT
  Filled 2024-01-03: qty 3

## 2024-01-03 MED ORDER — ACETAMINOPHEN 325 MG PO TABS
650.0000 mg | ORAL_TABLET | Freq: Four times a day (QID) | ORAL | Status: DC | PRN
  Administered 2024-01-03: 650 mg via ORAL
  Filled 2024-01-03: qty 2

## 2024-01-03 NOTE — Progress Notes (Signed)
 RN and NT made several attempts to wash and detangle patient's hair utilizing stock liquid shampoo and shampoo cap. Finally, patient stated, just cut it. I am tender headed. Reluctantly, RN cut mass of detangled hair out of patient's hair. Patient verbalized her head felt better and even joked, you removed an animal out of my hair.

## 2024-01-03 NOTE — Evaluation (Signed)
 Physical Therapy Evaluation Patient Details Name: KARSON CHICAS MRN: [id number] DOB: 06/30/1950 Today's Date: 01/03/2024  History of Present Illness  AAIRA OESTREICHER is a 73 y.o. female with medical history significant of  Dm type 2, HLD, HTN, CVA, COPD and HFpEF aortic mural thrombus, mild cognitive impairment, GI bleed and recurrent falls who presents to the ER with complaint of shortness of breath.  Clinical Impression  Pt admitted with above diagnosis. Pt in bed, Phil, caregiver, is present in the room. Pt able to answer history questions with some help from Phil. She lives in a single story home with 5 steps to enter, handrails bil. [name] is with her from 8am-5pm 7 days per week, but pt is otherwise home alone. She has a RW at home. Pt requires assistance for sit to stand and for standing balance, level of assist inc from min A -mod A when steps are integrated away from edge of bed to work towards chair. Improved Sit to Stand with arm rests of the chair. Pt resting comfortably at conclusion and OT following up. Based on pt PLOF, level of support, and current functional status, pt will benefit from skilled inpatient physical therapy <3 hours per day at dc for safe progression of functional mobility. Pt currently with functional limitations due to the deficits listed below (see PT Problem List). Pt will benefit from acute skilled PT to increase their independence and safety with mobility to allow discharge.           If plan is discharge home, recommend the following: Assistance with cooking/housework;Help with stairs or ramp for entrance;Assist for transportation;A lot of help with walking and/or transfers;Supervision due to cognitive status;Two people to help with walking and/or transfers;Two people to help with bathing/dressing/bathroom   Can travel by private vehicle   No    Equipment Recommendations None recommended by PT  Recommendations for Other Services       Functional Status  Assessment Patient has had a recent decline in their functional status and demonstrates the ability to make significant improvements in function in a reasonable and predictable amount of time.     Precautions / Restrictions Precautions Precautions: Fall Recall of Precautions/Restrictions: Impaired Precaution/Restrictions Comments: dec safety awareness Restrictions Weight Bearing Restrictions Per Provider Order: No      Mobility  Bed Mobility Overal bed mobility: Needs Assistance Bed Mobility: Supine to Sit     Supine to sit: Contact guard, Supervision     General bed mobility comments: able to come to sit without physical assistance, inc time req and uses handrails    Transfers Overall transfer level: Needs assistance Equipment used: Rolling walker (2 wheels) Transfers: Sit to/from Stand, Bed to chair/wheelchair/BSC Sit to Stand: Min assist, Contact guard assist   Step pivot transfers: Mod assist       General transfer comment: min A from bed, CGA from recliner, mod A for step pivot to recliner with heavy sequencing cues, HR 115 with this task and inc instability with weight shifting, deferred further mobility this session for safety    Ambulation/Gait                  Stairs            Wheelchair Mobility     Tilt Bed    Modified Rankin (Stroke Patients Only)       Balance Overall balance assessment: Needs assistance Sitting-balance support: Feet unsupported, Bilateral upper extremity supported Sitting balance-Leahy Scale: Fair Sitting balance - Comments:  supervision EOB Postural control: Right lateral lean, Posterior lean Standing balance support: Bilateral upper extremity supported, During functional activity, Reliant on assistive device for balance Standing balance-Leahy Scale: Poor Standing balance comment: reliant on RW for support, unable to maintain balance initially in standing at edge of bed due to posterior lean, returns to sitting  EOB                             Pertinent Vitals/Pain Pain Assessment Pain Assessment: No/denies pain    Home Living Family/patient expects to be discharged to:: Private residence Living Arrangements: Alone Available Help at Discharge: Available PRN/intermittently;Friend(s) Type of Home: House Home Access: Stairs to enter Entrance Stairs-Rails: Right;Left;Can reach both Entrance Stairs-Number of Steps: 5   Home Layout: One level Home Equipment: Agricultural Consultant (2 wheels);Cane - single point;Shower seat;BSC/3in1 Additional Comments: Has help from friend [name] 8-5pm 7 days per week, home alone otherwise, shower not accessible    Prior Function Prior Level of Function : Needs assist             Mobility Comments: uses RW ADLs Comments: Ind with ADLs/selfcare, bathes at sink; friend [employer], drives, grocery shops     Extremity/Trunk Assessment   Upper Extremity Assessment Upper Extremity Assessment: Defer to OT evaluation    Lower Extremity Assessment Lower Extremity Assessment: Generalized weakness    Cervical / Trunk Assessment Cervical / Trunk Assessment: Kyphotic  Communication   Communication Communication: Impaired Factors Affecting Communication: Hearing impaired    Cognition Arousal: Alert Behavior During Therapy: Impulsive   PT - Cognitive impairments: Safety/Judgement, Problem solving, Attention, Awareness                       PT - Cognition Comments: Able to arouse with stimulation. Slow to process. Following commands: Impaired Following commands impaired: Follows one step commands with increased time, Follows multi-step commands inconsistently, Follows one step commands inconsistently     Cueing Cueing Techniques: Verbal cues, Gestural cues, Tactile cues     General Comments      Exercises     Assessment/Plan    PT Assessment Patient needs continued PT services  PT Problem List Decreased strength;Decreased activity  tolerance;Decreased mobility;Decreased cognition;Decreased safety awareness;Decreased balance;Decreased knowledge of use of DME;Decreased knowledge of precautions       PT Treatment Interventions DME instruction;Gait training;Functional mobility training;Therapeutic exercise;Patient/family education;Balance training;Therapeutic activities;Stair training    PT Goals (Current goals can be found in the Care Plan section)  Acute Rehab PT Goals Patient Stated Goal: regain strength and walking PT Goal Formulation: With patient Time For Goal Achievement: 01/17/24 Potential to Achieve Goals: Fair    Frequency Min 2X/week     Co-evaluation               AM-PAC PT 6 Clicks Mobility  Outcome Measure Help needed turning from your back to your side while in a flat bed without using bedrails?: A Little Help needed moving from lying on your back to sitting on the side of a flat bed without using bedrails?: A Little Help needed moving to and from a bed to a chair (including a wheelchair)?: A Lot Help needed standing up from a chair using your arms (e.g., wheelchair or bedside chair)?: A Little Help needed to walk in hospital room?: A Lot Help needed climbing 3-5 steps with a railing? : Total 6 Click Score: 14    End of Session Equipment  Utilized During Treatment: Gait belt Activity Tolerance: Patient tolerated treatment well Patient left: in chair;with call bell/phone within reach;with chair alarm set Nurse Communication: Mobility status PT Visit Diagnosis: Unsteadiness on feet (R26.81);Other abnormalities of gait and mobility (R26.89);Repeated falls (R29.6);Muscle weakness (generalized) (M62.81)    Time: 8558-8486 PT Time Calculation (min) (ACUTE ONLY): 32 min   Charges:   PT Evaluation $PT Eval Low Complexity: 1 Low PT Treatments $Therapeutic Activity: 8-22 mins PT General Charges $$ ACUTE PT VISIT: 1 Visit         Stann, PT Acute Rehabilitation Services Office: [phone] 01/03/2024   Stann DELENA Ohara 01/03/2024, 4:14 PM

## 2024-01-03 NOTE — TOC Progression Note (Signed)
 Transition of Care [facility]) - Progression Note    Patient Details  Name: Allison Hughes MRN: [id number] Date of Birth: 06-28-1950  Transition of Care [facility]) CM/SW Contact  Sonda Manuella Quill, RN Phone Number: 01/03/2024, 5:30 PM  Clinical Narrative:    LVM for Brittany at [facility] to see if pt can return; awaiting return call.                     Expected Discharge Plan and Services                                               Social Drivers of Health (SDOH) Interventions SDOH Screenings   Food Insecurity: No Food Insecurity (01/02/2024)  Housing: Low Risk (01/02/2024)  Transportation Needs: No Transportation Needs (01/02/2024)  Utilities: Not At Risk (01/02/2024)  Social Connections: Unknown (01/02/2024)  Tobacco Use: High Risk (01/02/2024)    Readmission Risk Interventions    10/24/2023    2:14 PM  Readmission Risk Prevention Plan  Transportation Screening Complete  PCP or Specialist Appt within 3-5 Days Complete  HRI or Home Care Consult Complete  Social Work Consult for Recovery Care Planning/Counseling Complete  Palliative Care Screening Complete  Medication Review Oceanographer) Referral to Pharmacy

## 2024-01-03 NOTE — Consult Note (Signed)
 " Consultation Note Date: 01/03/2024   Patient Name: Allison Hughes  DOB: October 21, 1950  MRN: [id number]  Age / Sex: 73 y.o., female   PCP: Valma Carwin, MD Referring Physician: Raenelle Coria, MD  Reason for Consultation: Establishing goals of care     Chief Complaint/History of Present Illness:   Patient is a 73 year old female with a past medical history of dementia, type 2 diabetes mellitus, hypertension, hyperlipidemia, prior CVA, COPD, HFpEF, aortic mural thrombus, history of GI bleed, and recurrent falls who was admitted on 01/01/2024 with hypoxia and altered mental status.  Patient had just been discharged from being admitted on 12/14-12/18/25 for management of altered mental status secondary to Citrobacter UTI and when patient presented to SNF in the evening of 01/01/2024, they did not take her admission due to hypoxia and altered mental status.  Since hospitalization, patient receiving management for acute hypoxic respiratory failure and altered mental status.  Palliative medicine team consulted to assist with complex medical decision making.  Reviewed EMR including recent documentation from ED physician, hospitalist, and records from hospitalization last admit.  Reviewed recent CBC noting WBC elevated at 24.4.  Personally reviewed recent MRI brain which showed no acute intracranial abnormality; age-related atrophy and moderately advanced diffuse cerebral white matter disease.  Also reviewed CT which did not show any acute pneumonia.  Presented to bedside to see patient.  Tech present with patient at bedside assisting with care.  Patient laying comfortably in bed without any signs of distress.  Introduced myself to patient as a member of the palliative medicine team.  No visitors were present at bedside.  Patient is only oriented to herself and able to engage in some questions though no complex questions.  Patient not able to tell me why she is in the hospital.  Did inquire about family  support and patient noted that most of her family is deceased though she mentioned an older brother.  Could not tell me further information about him.  Inquired about her friend, [name], listed in the chart and she noted that he is a friend who helps take care of her dog who was at home.  Not able to obtain complex information from patient.  Discussed care with hospitalist, TOC, PT/OT, and bedside RN to coordinate care.  Recommend TOC follow-up about appropriate next of kin for patient to determine who could assist in decision making on her behalf.  If no appropriate next of kin located, may need to seek guardianship.  Appreciate TOC's assistance in follow-up regarding this manner.  Noted when appropriate and okay located, could then engage in goals of care discussions.  Primary Diagnoses  Present on Admission:  Acute respiratory failure with hypoxia (HCC)  Essential hypertension  Acute encephalopathy   Past Medical History:  Diagnosis Date   Acute metabolic encephalopathy 10/20/2023   Acute pyelonephritis 02/02/2016   Acute respiratory failure with hypoxia (HCC) 09/20/2023   Diabetes mellitus 01/15/2004   T2DM   Hyperlipidemia    Hypertension    Obesity (BMI 30-39.9)    Pneumonia 09/06/2020   Sepsis (HCC) 02/02/2016   Stroke [facility])    Urinary tract infection 10/20/2023   Social History   Socioeconomic History   Marital status: Divorced    Spouse name: Not on file   Number of children: Not on file   Years of education: Not on file   Highest education level: Not on file  Occupational History   Not on file  Tobacco Use   Smoking status:  Every Day    Current packs/day: 1.00    Average packs/day: 1 pack/day for 40.0 years (40.0 ttl pk-yrs)    Types: Cigarettes   Smokeless tobacco: Former    Quit date: 05/11/2011  Substance and Sexual Activity   Alcohol use: No   Drug use: No   Sexual activity: Never  Other Topics Concern   Not on file  Social History Narrative    Not on file   Social Drivers of Health   Tobacco Use: High Risk (01/02/2024)   Patient History    Smoking Tobacco Use: Every Day    Smokeless Tobacco Use: Former    Passive Exposure: Not on Actuary Strain: Not on file  Food Insecurity: No Food Insecurity (01/02/2024)   Epic    Worried About Programme Researcher, Broadcasting/film/video in the Last Year: Never true    Ran Out of Food in the Last Year: Never true  Transportation Needs: No Transportation Needs (01/02/2024)   Epic    Lack of Transportation (Medical): No    Lack of Transportation (Non-Medical): No  Physical Activity: Not on file  Stress: Not on file  Social Connections: Unknown (01/02/2024)   Social Connection and Isolation Panel    Frequency of Communication with Friends and Family: Patient unable to answer    Frequency of Social Gatherings with Friends and Family: Patient unable to answer    Attends Religious Services: Not on file    Active Member of Clubs or Organizations: Patient unable to answer    Attends Banker Meetings: Patient unable to answer    Marital Status: Living with partner  Depression (PHQ2-9): Not on file  Alcohol Screen: Not on file  Housing: Low Risk (01/02/2024)   Epic    Unable to Pay for Housing in the Last Year: No    Number of Times Moved in the Last Year: 0    Homeless in the Last Year: No  Utilities: Not At Risk (01/02/2024)   Epic    Threatened with loss of utilities: No  Health Literacy: Not on file   Family History  Problem Relation Age of Onset   Drug abuse Daughter    Diabetes Daughter    Scheduled Meds:  arformoterol   15 mcg Nebulization BID   aspirin  EC  81 mg Oral Daily   atorvastatin   40 mg Oral Daily   budesonide  (PULMICORT ) nebulizer solution  0.25 mg Nebulization BID   heparin   5,000 Units Subcutaneous Q8H   insulin  aspart  0-6 Units Subcutaneous TID WC   insulin  glargine  10 Units Subcutaneous Daily   ipratropium-albuterol   3 mL Nebulization TID    lisinopril   20 mg Oral Daily   predniSONE   40 mg Oral Q breakfast   QUEtiapine   12.5 mg Oral QHS   Continuous Infusions:  sodium chloride  40 mL/hr at 01/02/24 1753   levofloxacin  (LEVAQUIN ) IV Stopped (01/02/24 1644)   PRN Meds:.acetaminophen , ALPRAZolam , ondansetron  (ZOFRAN ) IV Allergies[1] CBC:    Component Value Date/Time   WBC 24.2 (H) 01/02/2024 1802   HGB 10.9 (L) 01/02/2024 1802   HCT 34.7 (L) 01/02/2024 1802   PLT 397 01/02/2024 1802   MCV 78.9 (L) 01/02/2024 1802   NEUTROABS 22.9 (H) 01/02/2024 1802   LYMPHSABS 0.6 (L) 01/02/2024 1802   MONOABS 0.3 01/02/2024 1802   EOSABS 0.0 01/02/2024 1802   BASOSABS 0.0 01/02/2024 1802   Comprehensive Metabolic Panel:    Component Value Date/Time   NA 138 01/01/2024 2355  K 4.4 01/01/2024 2355   CL 104 01/01/2024 2355   CO2 23 01/01/2024 2355   BUN 15 01/01/2024 2355   CREATININE 0.61 01/01/2024 2355   GLUCOSE 183 (H) 01/01/2024 2355   CALCIUM  9.9 01/01/2024 2355   AST 15 12/28/2023 1815   ALT 13 12/28/2023 1815   ALKPHOS 123 12/28/2023 1815   BILITOT 0.5 12/28/2023 1815   PROT 6.0 (L) 12/28/2023 1815   ALBUMIN 2.8 (L) 12/28/2023 1815    Physical Exam: Vital Signs: BP (!) 120/58 (BP Location: Left Arm)   Pulse 83   Temp 98.5 F (36.9 C)   Resp 17   Ht 5' 7 (1.702 m)   Wt 68 kg   SpO2 98%   BMI 23.48 kg/m  SpO2: SpO2: 98 % O2 Device: O2 Device: Nasal Cannula O2 Flow Rate: O2 Flow Rate (L/min): 6 L/min Intake/output summary:  Intake/Output Summary (Last 24 hours) at 01/03/2024 0716 Last data filed at 01/03/2024 9657 Gross per 24 hour  Intake 674.15 ml  Output --  Net 674.15 ml   LBM: Last BM Date : 12/30/23 Baseline Weight: Weight: 68 kg Most recent weight: Weight: 68 kg  General: NAD, oriented to self, awake, chronically ill-appearing Cardiovascular: RRR Respiratory: no increased work of breathing noted, not in respiratory distress, on nasal cannula 4 L/min Abdomen: not distended Neuro: Awake,  alert, oriented only to self        Palliative Performance Scale: 40%              Additional Data Reviewed: Recent Labs    01/01/24 0504 01/01/24 2355 01/02/24 1802  WBC 9.0 21.8* 24.2*  HGB 11.7* 12.1 10.9*  PLT 369 429* 397  NA 139 138  --   BUN 9 15  --   CREATININE 0.66 0.61  --     Imaging: MR BRAIN WO CONTRAST EXAM: MRI BRAIN WITHOUT CONTRAST 01/02/2024 12:37:35 PM  TECHNIQUE: Multiplanar multisequence MRI of the head/brain was performed without the administration of intravenous contrast.  COMPARISON: MRI of the head dated 10/20/2023.  CLINICAL HISTORY: Mental status change, unknown cause.  FINDINGS:  BRAIN AND VENTRICLES: No acute infarct. No intracranial hemorrhage. No mass. No midline shift. No hydrocephalus. Age-related atrophy. Moderately advanced diffuse cerebral white matter disease. Focal encephalomalacia changes within the right parietal lobe and within the left inferior cerebellar hemisphere. The sella is unremarkable. Normal flow voids.  ORBITS: No acute abnormality.  SINUSES AND MASTOIDS: No acute abnormality.  BONES AND SOFT TISSUES: Normal marrow signal. No acute soft tissue abnormality.  IMPRESSION: 1. No acute intracranial abnormality. 2. Age-related atrophy and moderately advanced diffuse cerebral white matter disease. 3. Focal encephalomalacia changes within the right parietal lobe and within the left inferior cerebellar hemisphere.  Electronically signed by: Evalene Coho MD 01/02/2024 12:42 PM EST RP Workstation: [id number] CT ABDOMEN PELVIS W CONTRAST EXAM: CT ABDOMEN AND PELVIS WITH CONTRAST 01/02/2024 01:18:15 AM  TECHNIQUE: CT of the abdomen and pelvis was performed with the administration of 100 mL of iohexol  (OMNIPAQUE ) 350 MG/ML injection. Multiplanar reformatted images are provided for review. Automated exposure control, iterative reconstruction, and/or weight-based adjustment of the mA/kV was utilized to  reduce the radiation dose to as low as reasonably achievable.  COMPARISON: None available.  CLINICAL HISTORY: Bowel obstruction suspected.  FINDINGS:  LOWER CHEST: No acute abnormality.  LIVER: The liver is unremarkable.  GALLBLADDER AND BILE DUCTS: Cholelithiasis is noted without evidence of complicating factors. No biliary ductal dilatation.  SPLEEN: No acute abnormality.  PANCREAS:  No acute abnormality.  ADRENAL GLANDS: Left adrenal lesion is noted to be stable over multiple previous exams, most consistent with adenoma.  KIDNEYS, URETERS AND BLADDER: Kidneys show no focal mass or obstructive changes. No calculi are noted. No perinephric or periureteral stranding. The bladder is partially distended.  GI AND BOWEL: Stomach demonstrates no acute abnormality. The bowel is unremarkable. No obstructive or inflammatory changes of the colon are seen. Mild diverticular changes noted. The appendix is not well visualized. No inflammatory changes to suggest appendicitis are noted. There is no bowel obstruction.  PERITONEUM AND RETROPERITONEUM: No free fluid is seen. No free air.  VASCULATURE: Aorta is normal in caliber. Aortic calcifications are seen without aneurysmal dilatation.  LYMPH NODES: No lymphadenopathy.  REPRODUCTIVE ORGANS: The uterus is within normal limits.  BONES AND SOFT TISSUES: The bony structures are within normal limits with the exception of bilateral L5 pars defects. No focal soft tissue abnormality.  IMPRESSION: 1. Diverticulosis without diverticulitis. 2. Stable left adrenal adenoma. 3. Cholelithiasis without complicating factors.  Electronically signed by: Oneil Devonshire MD 01/02/2024 01:30 AM EST RP Workstation: [id number] CT Angio Chest PE W and/or Wo Contrast EXAM: CTA CHEST 01/02/2024 01:18:15 AM  TECHNIQUE: CTA of the chest was performed after the administration of intravenous contrast. 100 ml omnipaque  315 . Multiplanar  reformatted images are provided for review. MIP images are provided for review. Automated exposure control, iterative reconstruction, and/or weight based adjustment of the mA/kV was utilized to reduce the radiation dose to as low as reasonably achievable.  COMPARISON: None available.  CLINICAL HISTORY: Decreased oxygen saturation  FINDINGS:  PULMONARY ARTERIES: The pulmonary artery shows a normal branching pattern without evidence of intraluminal filling defect to suggest pulmonary embolism. Main pulmonary artery is normal in caliber.  MEDIASTINUM: The heart is not significantly enlarged in size. The pericardium demonstrates no acute abnormality. The thoracic aorta shows atherosclerotic calcifications. No aneurysmal dilatation is seen. Mural thrombus is noted within the descending thoracic aorta. No dissection is seen. The thoracic inlet is within normal limits. No hilar or mediastinal edema is noted. The esophagus, as visualized, is within normal limits.  LYMPH NODES: No mediastinal, hilar or axillary lymphadenopathy.  LUNGS AND PLEURA: The lungs demonstrate bibasilar atelectatic changes. Mild emphysematous changes are seen as well. No focal infiltrate or effusion is noted. No parenchymal nodules are seen. No pneumothorax.  UPPER ABDOMEN: Limited images of the upper abdomen are unremarkable.  SOFT TISSUES AND BONES: The bony structures show degenerative change of the thoracic spine. No acute soft tissue abnormality.  IMPRESSION: 1. No evidence of pulmonary embolism. 2. Mural thrombus within the descending thoracic aorta, without dissection.  Electronically signed by: Oneil Devonshire MD 01/02/2024 01:27 AM EST RP Workstation: [id number]    I personally reviewed recent imaging.   Palliative Care Assessment and Plan Summary of Established Goals of Care and Medical Treatment Preferences   Patient is a 73 year old female with a past medical history of dementia, type 2  diabetes mellitus, hypertension, hyperlipidemia, prior CVA, COPD, HFpEF, aortic mural thrombus, history of GI bleed, and recurrent falls who was admitted on 01/01/2024 with hypoxia and altered mental status.  Patient had just been discharged from being admitted on 12/14-12/18/25 for management of altered mental status secondary to Citrobacter UTI and when patient presented to SNF in the evening of 01/01/2024, they did not take her admission due to hypoxia and altered mental status.  Since hospitalization, patient receiving management for acute hypoxic respiratory failure and altered mental status.  Palliative  medicine team consulted to assist with complex medical decision making.  # Complex medical decision making/goals of care  - Patient unable to participate in complex medical decision making at time of visit.  Recommend TOC involvement to determine appropriate next of kin to assist in medical decision making for the patient.  If next of kin cannot be located, would need to seek guardianship.  Need decision-maker established for palliative medicine team to discuss care planning moving forward.  Noted palliative medicine team will continue to follow along with patient's medical journey.  -  Code Status: Full Code   # Psycho-social/Spiritual Support:  - Support System: Salvador Cong mentioned in chart  # Discharge Planning:  To Be Determined  Thank you for allowing the palliative care team to participate in the care Lamarr LITTIE Fire.  Tinnie Radar, DO Palliative Care Provider PMT # [phone]  If patient remains symptomatic despite maximum doses, please call PMT at [phone] between 0700 and 1900. Outside of these hours, please call attending, as PMT does not have night coverage.  Billing based on MDM: High  Problems Addressed: One or more chronic illnesses with severe exacerbation, progression, or side effects of treatment.  Amount and/or Complexity of Data: Category  2:Independent interpretation of a test performed by another physician/other qualified health care professional (not separately reported) and Category 3:Discussion of management or test interpretation with external physician/other qualified health care professional/appropriate source (not separately reported)      [1]  Allergies Allergen Reactions   Iodine Swelling    Not allergic to IV contrast [address] [facility]   Shrimp [Shellfish Allergy] Swelling    Fresh shrimp   "

## 2024-01-03 NOTE — Plan of Care (Signed)

## 2024-01-03 NOTE — Progress Notes (Signed)
 " PROGRESS NOTE    Allison Hughes  FMW:[id number] DOB: June 30, 1950 DOA: 01/01/2024 PCP: Valma Carwin, MD    Brief Narrative:  73 year old with dementia, type 2 diabetes, hypertension, hyperlipidemia, previous stroke, COPD, heart failure with preserved ejection fraction, aortic mural thrombus, history of GI bleed and recurrent fall who was admitted to the hospital 12/14-12/18/25 with altered mental status apparently secondary to Citrobacter UTI.  She was thought to be encephalopathic from Xanax , Seroquel  also.  Patient was discharged to SNF later in the night, they did not take her admission because she was hypoxic and altered.  She was sent back to the hospital, brought to [facility].   On arrival to the emergency room, patient was arousable but unable to give any history.  Blood pressure stable.  Oxygen stable on 2 L oxygen supplementation.  WBC count 21.8 with normal WBC count in the morning before discharge. Blood gas and electrolytes fairly stable. Troponins are normal. CT scan abdomen pelvis without any acute abnormalities. CT chest without any acute abnormality, known mural thrombus. Respiratory virus panel, RSV and flu negative. Blood glucose adequate. Admitted due to altered mentation, possible aspiration pneumonia, hypoxemia.  Subjective:  Patient seen and examined.  Patient is alert awake and oriented to herself.  She tells me that she feels fine.  She has some dry cough.  Afebrile overnight.  She has obvious tremors which is her baseline. Does get anxiety attacks. She tells us  that she has a friend but no health care power of attorney. She agrees to go to SNF. Poor historian.   Assessment & Plan:   Altered mental status in a patient with known cognitive decline, recently hospitalized. UTI with Citrobacter.   Currently no focal deficits. Respiratory  virus panel negative. Brain MRI with chronic encephalomalacia.  No acute stroke.   No evidence of CO2 narcosis. No  evidence of consolidation. Probably her baseline. Low-dose Xanax  as she takes it as a long-term for anxiety.  Seroquel  12.5 mg at night, she was on 50 mg at home before previous hospitalization. Citrobacter, will treat with Levaquin . Remains full code. Significant leukocytosis, will continue antibiotics today.  Discontinue steroids. Keep on oxygen to keep saturation more than 90%. Procalcitonin less than 0.1.  B12 507.  A.m. cortisol 6.  TSH 0.5.  Acute hypoxemic respiratory failure: Likely secondary to on and off sleepiness.  There was no evidence of pneumonia or CO2 retention.  Keep on oxygen to keep saturation more than 90%.  Will probably need oxygen supplementation.      DVT prophylaxis: heparin  injection 5,000 Units Start: 01/02/24 1400   Code Status: Full code.  Palliative consulted for goal of care, however patient is unable to participate at this time. Family Communication: None available Disposition Plan: Status is: Inpatient Remains inpatient appropriate because: Unsafe discharge planning     Consultants:  Palliative care  Procedures:  None  Antimicrobials:  Levaquin  12/19---     Objective: Vitals:   01/03/24 0224 01/03/24 1139 01/03/24 1141 01/03/24 1142  BP: (!) 120/58     Pulse: 83     Resp: 17     Temp: 98.5 F (36.9 C)     TempSrc:      SpO2: 98% 95% 95% 95%  Weight:      Height:        Intake/Output Summary (Last 24 hours) at 01/03/2024 1153 Last data filed at 01/03/2024 0900 Gross per 24 hour  Intake 974.15 ml  Output --  Net 974.15 ml  Filed Weights   01/02/24 1821  Weight: 68 kg    Examination:  General exam: Appears calm and comfortable at rest. Patient is chronically sick looking.  Frail and debility.  She has some resting tremors of both hands. Patient is alert awake and oriented to herself and situation.  Moves all extremities but grossly weak. Respiratory system: Clear to auscultation. Respiratory effort normal.  No added  sounds. SpO2: 95 % O2 Flow Rate (L/min): (S) 4 L/min FiO2 (%): 40 %  Cardiovascular system: S1 & S2 heard, RRR.  No pedal edema. Gastrointestinal system: Soft.  Nontender.  Bowel sound present.   Data Reviewed: I have personally reviewed following labs and imaging studies  CBC: Recent Labs  Lab 12/28/23 1815 12/29/23 0424 12/30/23 0452 12/31/23 0416 01/01/24 0504 01/01/24 2355 01/02/24 1802  WBC 10.6*   < > 6.4 6.0 9.0 21.8* 24.2*  NEUTROABS 8.2*  --   --   --   --  20.6* 22.9*  HGB 11.7*   < > 10.9* 10.7* 11.7* 12.1 10.9*  HCT 38.1   < > 35.2* 33.4* 36.3 39.1 34.7*  MCV 79.5*   < > 78.9* 78.6* 77.7* 79.6* 78.9*  PLT 351   < > 360 334 369 429* 397   < > = values in this interval not displayed.   Basic Metabolic Panel: Recent Labs  Lab 12/29/23 0424 12/30/23 0452 12/31/23 0416 01/01/24 0504 01/01/24 2355  NA 141 143 142 139 138  K 3.5 3.2* 4.4 4.3 4.4  CL 108 110 111 106 104  CO2 26 27 23 23 23   GLUCOSE 132* 100* 113* 141* 183*  BUN 13 8 9 9 15   CREATININE 0.49 0.49 0.53 0.66 0.61  CALCIUM  8.8* 8.7* 9.4 10.0 9.9   GFR: Estimated Creatinine Clearance: 61.8 mL/min (by C-G formula based on SCr of 0.61 mg/dL). Liver Function Tests: Recent Labs  Lab 12/28/23 1815  AST 15  ALT 13  ALKPHOS 123  BILITOT 0.5  PROT 6.0*  ALBUMIN 2.8*   Recent Labs  Lab 01/01/24 2355  LIPASE 12   No results for input(s): AMMONIA in the last 168 hours. Coagulation Profile: No results for input(s): INR, PROTIME in the last 168 hours. Cardiac Enzymes: Recent Labs  Lab 12/28/23 1815  CKTOTAL 46   BNP (last 3 results) Recent Labs    01/01/24 2355  PROBNP 80.6   HbA1C: No results for input(s): HGBA1C in the last 72 hours. CBG: Recent Labs  Lab 01/02/24 0734 01/02/24 1146 01/02/24 1652 01/02/24 2037 01/03/24 0818  GLUCAP 260* 305* 273* 271* 209*   Lipid Profile: No results for input(s): CHOL, HDL, LDLCALC, TRIG, CHOLHDL, LDLDIRECT in the last  72 hours. Thyroid Function Tests: Recent Labs    01/02/24 1630  TSH 0.542   Anemia Panel: Recent Labs    01/02/24 1630  VITAMINB12 507  FOLATE 6.3   Sepsis Labs: Recent Labs  Lab 01/01/24 2359 01/02/24 0152 01/02/24 1630  PROCALCITON  --   --  <0.10  LATICACIDVEN 1.2 1.1  --     Recent Results (from the past 240 hours)  Urine Culture     Status: Abnormal   Collection Time: 12/28/23  7:41 PM   Specimen: Urine, Random  Result Value Ref Range Status   Specimen Description URINE, RANDOM  Final   Special Requests   Final    URINE, CLEAN CATCH Performed at [facility], [address], [place], [place] [address]    Culture >=  100,000 COLONIES/mL CITROBACTER FREUNDII (A)  Final   Report Status 12/30/2023 FINAL  Final   Organism ID, Bacteria CITROBACTER FREUNDII (A)  Final      Susceptibility   Citrobacter freundii - MIC*    CEFEPIME <=0.12 SENSITIVE Sensitive     ERTAPENEM <=0.12 SENSITIVE Sensitive     CEFTRIAXONE  8 RESISTANT Resistant     CIPROFLOXACIN <=0.06 SENSITIVE Sensitive     GENTAMICIN <=1 SENSITIVE Sensitive     NITROFURANTOIN <=16 SENSITIVE Sensitive     TRIMETH /SULFA  <=20 SENSITIVE Sensitive     PIP/TAZO Value in next row Sensitive      <=4 SENSITIVEThis is a modified FDA-approved test that has been validated and its performance characteristics determined by the reporting laboratory.  This laboratory is certified under the Clinical Laboratory Improvement Amendments CLIA as qualified to perform high complexity clinical laboratory testing.    MEROPENEM Value in next row Sensitive      <=4 SENSITIVEThis is a modified FDA-approved test that has been validated and its performance characteristics determined by the reporting laboratory.  This laboratory is certified under the Clinical Laboratory Improvement Amendments CLIA as qualified to perform high complexity clinical laboratory testing.    * >=100,000 COLONIES/mL CITROBACTER FREUNDII  Culture, blood (routine  x 2)     Status: None (Preliminary result)   Collection Time: 01/02/24 12:58 AM   Specimen: BLOOD  Result Value Ref Range Status   Specimen Description   Final    BLOOD BLOOD RIGHT ARM Performed at [facility], [address] [place], [place] [address]    Special Requests   Final    BOTTLES DRAWN AEROBIC AND ANAEROBIC Blood Culture results may not be optimal due to an inadequate volume of blood received in culture bottles Performed at [facility], [address], [place], [place] [address]    Culture   Final    NO GROWTH 1 DAY Performed at [facility], [address], [place], [place] [address]    Report Status PENDING  Incomplete  Respiratory (~20 pathogens) panel by PCR     Status: None   Collection Time: 01/02/24  2:59 AM   Specimen: Nasopharyngeal Swab; Respiratory  Result Value Ref Range Status   Adenovirus NOT DETECTED NOT DETECTED Final   Coronavirus 229E NOT DETECTED NOT DETECTED Final    Comment: (NOTE) The Coronavirus on the Respiratory Panel, DOES NOT test for the novel  Coronavirus (2019 nCoV)    Coronavirus HKU1 NOT DETECTED NOT DETECTED Final   Coronavirus NL63 NOT DETECTED NOT DETECTED Final   Coronavirus OC43 NOT DETECTED NOT DETECTED Final   Metapneumovirus NOT DETECTED NOT DETECTED Final   Rhinovirus / Enterovirus NOT DETECTED NOT DETECTED Final   Influenza A NOT DETECTED NOT DETECTED Final   Influenza B NOT DETECTED NOT DETECTED Final   Parainfluenza Virus 1 NOT DETECTED NOT DETECTED Final   Parainfluenza Virus 2 NOT DETECTED NOT DETECTED Final   Parainfluenza Virus 3 NOT DETECTED NOT DETECTED Final   Parainfluenza Virus 4 NOT DETECTED NOT DETECTED Final   Respiratory Syncytial Virus NOT DETECTED NOT DETECTED Final   Bordetella pertussis NOT DETECTED NOT DETECTED Final   Bordetella Parapertussis NOT DETECTED NOT DETECTED Final   Chlamydophila pneumoniae NOT DETECTED NOT DETECTED Final   Mycoplasma pneumoniae  NOT DETECTED NOT DETECTED Final    Comment: Performed at [facility], [address] [place], [place] [address]  Culture, blood (routine x 2)  Status: None (Preliminary result)   Collection Time: 01/02/24  4:30 PM   Specimen: BLOOD RIGHT ARM  Result Value Ref Range Status   Specimen Description   Final    BLOOD RIGHT ARM Performed at [facility], [address], [place], [place] [address]    Special Requests   Final    BOTTLES DRAWN AEROBIC ONLY Blood Culture adequate volume Performed at [facility], [address] [place], [place] [address]    Culture   Final    NO GROWTH < 12 HOURS Performed at [facility], [address], [place], [place] [address]    Report Status PENDING  Incomplete         Radiology Studies: MR BRAIN WO CONTRAST Result Date: 01/02/2024 EXAM: MRI BRAIN WITHOUT CONTRAST 01/02/2024 12:37:35 PM TECHNIQUE: Multiplanar multisequence MRI of the head/brain was performed without the administration of intravenous contrast. COMPARISON: MRI of the head dated 10/20/2023. CLINICAL HISTORY: Mental status change, unknown cause. FINDINGS: BRAIN AND VENTRICLES: No acute infarct. No intracranial hemorrhage. No mass. No midline shift. No hydrocephalus. Age-related atrophy. Moderately advanced diffuse cerebral white matter disease. Focal encephalomalacia changes within the right parietal lobe and within the left inferior cerebellar hemisphere. The sella is unremarkable. Normal flow voids. ORBITS: No acute abnormality. SINUSES AND MASTOIDS: No acute abnormality. BONES AND SOFT TISSUES: Normal marrow signal. No acute soft tissue abnormality. IMPRESSION: 1. No acute intracranial abnormality. 2. Age-related atrophy and moderately advanced diffuse cerebral white matter disease. 3. Focal encephalomalacia changes within the right parietal lobe and within the left inferior cerebellar hemisphere. Electronically signed by: Evalene Coho MD 01/02/2024 12:42 PM EST RP Workstation: [id number]   CT ABDOMEN PELVIS W CONTRAST Result Date: 01/02/2024 EXAM: CT ABDOMEN AND PELVIS WITH CONTRAST 01/02/2024 01:18:15 AM TECHNIQUE: CT of the abdomen and pelvis was performed with the administration of 100 mL of iohexol  (OMNIPAQUE ) 350 MG/ML injection. Multiplanar reformatted images are provided for review. Automated exposure control, iterative reconstruction, and/or weight-based adjustment of the mA/kV was utilized to reduce the radiation dose to as low as reasonably achievable. COMPARISON: None available. CLINICAL HISTORY: Bowel obstruction suspected. FINDINGS: LOWER CHEST: No acute abnormality. LIVER: The liver is unremarkable. GALLBLADDER AND BILE DUCTS: Cholelithiasis is noted without evidence of complicating factors. No biliary ductal dilatation. SPLEEN: No acute abnormality. PANCREAS: No acute abnormality. ADRENAL GLANDS: Left adrenal lesion is noted to be stable over multiple previous exams, most consistent with adenoma. KIDNEYS, URETERS AND BLADDER: Kidneys show no focal mass or obstructive changes. No calculi are noted. No perinephric or periureteral stranding. The bladder is partially distended. GI AND BOWEL: Stomach demonstrates no acute abnormality. The bowel is unremarkable. No obstructive or inflammatory changes of the colon are seen. Mild diverticular changes noted. The appendix is not well visualized. No inflammatory changes to suggest appendicitis are noted. There is no bowel obstruction. PERITONEUM AND RETROPERITONEUM: No free fluid is seen. No free air. VASCULATURE: Aorta is normal in caliber. Aortic calcifications are seen without aneurysmal dilatation. LYMPH NODES: No lymphadenopathy. REPRODUCTIVE ORGANS: The uterus is within normal limits. BONES AND SOFT TISSUES: The bony structures are within normal limits with the exception of bilateral L5 pars defects. No focal soft tissue abnormality. IMPRESSION: 1. Diverticulosis without  diverticulitis. 2. Stable left adrenal adenoma. 3. Cholelithiasis without complicating factors. Electronically signed by: Oneil Devonshire MD 01/02/2024 01:30 AM EST RP Workstation: [id number]   CT Angio Chest PE W and/or Wo Contrast Result Date: 01/02/2024 EXAM: CTA CHEST 01/02/2024 01:18:15  AM TECHNIQUE: CTA of the chest was performed after the administration of intravenous contrast. 100 ml omnipaque  315 . Multiplanar reformatted images are provided for review. MIP images are provided for review. Automated exposure control, iterative reconstruction, and/or weight based adjustment of the mA/kV was utilized to reduce the radiation dose to as low as reasonably achievable. COMPARISON: None available. CLINICAL HISTORY: Decreased oxygen saturation FINDINGS: PULMONARY ARTERIES: The pulmonary artery shows a normal branching pattern without evidence of intraluminal filling defect to suggest pulmonary embolism. Main pulmonary artery is normal in caliber. MEDIASTINUM: The heart is not significantly enlarged in size. The pericardium demonstrates no acute abnormality. The thoracic aorta shows atherosclerotic calcifications. No aneurysmal dilatation is seen. Mural thrombus is noted within the descending thoracic aorta. No dissection is seen. The thoracic inlet is within normal limits. No hilar or mediastinal edema is noted. The esophagus, as visualized, is within normal limits. LYMPH NODES: No mediastinal, hilar or axillary lymphadenopathy. LUNGS AND PLEURA: The lungs demonstrate bibasilar atelectatic changes. Mild emphysematous changes are seen as well. No focal infiltrate or effusion is noted. No parenchymal nodules are seen. No pneumothorax. UPPER ABDOMEN: Limited images of the upper abdomen are unremarkable. SOFT TISSUES AND BONES: The bony structures show degenerative change of the thoracic spine. No acute soft tissue abnormality. IMPRESSION: 1. No evidence of pulmonary embolism. 2. Mural thrombus within the descending  thoracic aorta, without dissection. Electronically signed by: Oneil Devonshire MD 01/02/2024 01:27 AM EST RP Workstation: [id number]        Scheduled Meds:  arformoterol   15 mcg Nebulization BID   aspirin  EC  81 mg Oral Daily   atorvastatin   40 mg Oral Daily   budesonide  (PULMICORT ) nebulizer solution  0.25 mg Nebulization BID   heparin   5,000 Units Subcutaneous Q8H   insulin  aspart  0-6 Units Subcutaneous TID WC   insulin  glargine  10 Units Subcutaneous Daily   ipratropium-albuterol   3 mL Nebulization BID   lisinopril   20 mg Oral Daily   QUEtiapine   12.5 mg Oral QHS   Continuous Infusions:  sodium chloride  40 mL/hr at 01/02/24 1753   levofloxacin  (LEVAQUIN ) IV Stopped (01/02/24 1644)     LOS: 1 day    Time spent: 51 minutes    Renato Applebaum, MD [facility]   "

## 2024-01-03 NOTE — Evaluation (Signed)
 Occupational Therapy Evaluation Patient Details Name: Allison Hughes MRN: [id number] DOB: 1951/01/09 Today's Date: 01/03/2024   History of Present Illness   Allison Hughes is a 73 y.o. female with medical history significant of  Dm type 2, HLD, HTN, CVA, COPD and HFpEF aortic mural thrombus, mild cognitive impairment, GI bleed and recurrent falls who presents to the ER with complaint of shortness of breath     Clinical Impressions Pt presents with decline in function with safety with ADLs and ADL mobility with impaired strength, balance and endurance with impaired cognition and safety awareness. PTA pt lived alone and was Ind with ADLs/selfcare, used a RW and her friend [name] assists with cooking, pharmacologist and transportation. Pt currently requires max with LB ADLs, mod/min A with toileting tasks, CGA with grooming/hygiene standing at sink, min A/CGA with STS/mobility and transfers using RW. Pt was d/c to SNF for shor term rehab with most recent admission. Pt would benefit from acute OT services to address impairments to maximize level of function and safety     If plan is discharge home, recommend the following:   Assist for transportation;Assistance with cooking/housework;A lot of help with bathing/dressing/bathroom;Supervision due to cognitive status;A little help with walking and/or transfers     Functional Status Assessment   Patient has had a recent decline in their functional status and demonstrates the ability to make significant improvements in function in a reasonable and predictable amount of time.     Equipment Recommendations   Other (comment) (defer)     Recommendations for Other Services         Precautions/Restrictions   Precautions Precautions: Fall Recall of Precautions/Restrictions: Impaired Restrictions Weight Bearing Restrictions Per Provider Order: No     Mobility Bed Mobility               General bed mobility comments: pt in chair  upon arrival    Transfers Overall transfer level: Needs assistance Equipment used: Rolling walker (2 wheels) Transfers: Sit to/from Stand, Bed to chair/wheelchair/BSC Sit to Stand: Min assist, Contact guard assist           General transfer comment: min A STS from chair, CGA from commode, verbal cues for hand placement      Balance Overall balance assessment: Needs assistance Sitting-balance support: Feet unsupported, Bilateral upper extremity supported Sitting balance-Leahy Scale: Fair     Standing balance support: Bilateral upper extremity supported, During functional activity, Reliant on assistive device for balance Standing balance-Leahy Scale: Poor                             ADL either performed or assessed with clinical judgement   ADL Overall ADL's : Needs assistance/impaired Eating/Feeding: Set up;Independent;Sitting   Grooming: Wash/dry hands;Wash/dry face;Contact guard assist;Standing   Upper Body Bathing: Minimal assistance;Sitting   Lower Body Bathing: Maximal assistance;Sit to/from stand   Upper Body Dressing : Minimal assistance;Sitting   Lower Body Dressing: Maximal assistance   Toilet Transfer: Minimal assistance;Contact guard assist;Ambulation;Rolling walker (2 wheels);Regular Toilet;Grab bars;Cueing for safety;Cueing for sequencing   Toileting- Clothing Manipulation and Hygiene: Moderate assistance;Sit to/from stand       Functional mobility during ADLs: Minimal assistance;Contact guard assist;Rolling walker (2 wheels);Cueing for safety;Cueing for sequencing       Vision Baseline Vision/History: 1 Wears glasses Ability to See in Adequate Light: 1 Impaired Patient Visual Report: No change from baseline       Perception  Praxis         Pertinent Vitals/Pain Pain Assessment Pain Assessment: No/denies pain Faces Pain Scale: No hurt     Extremity/Trunk Assessment Upper Extremity Assessment Upper Extremity  Assessment: Generalized weakness;Right hand dominant   Lower Extremity Assessment Lower Extremity Assessment: Defer to PT evaluation       Communication Communication Communication: Impaired Factors Affecting Communication: Hearing impaired   Cognition Arousal: Alert Behavior During Therapy: Impulsive Cognition: Cognition impaired   Orientation impairments: Time, Situation Awareness: Intellectual awareness impaired Memory impairment (select all impairments): Short-term memory   Executive functioning impairment (select all impairments): Sequencing, Problem solving, Reasoning                   Following commands: Impaired Following commands impaired: Follows one step commands with increased time, Follows multi-step commands inconsistently, Follows one step commands inconsistently     Cueing  General Comments   Cueing Techniques: Verbal cues;Gestural cues;Tactile cues      Exercises     Shoulder Instructions      Home Living Family/patient expects to be discharged to:: Skilled nursing facility Living Arrangements: Alone Available Help at Discharge: Available PRN/intermittently;Friend(s) Type of Home: House Home Access: Stairs to enter [employer] of Steps: 3 Entrance Stairs-Rails: Right;Left;Can reach both Home Layout: One level     Bathroom Shower/Tub: Chief Strategy Officer: Standard     Home Equipment: Agricultural Consultant (2 wheels);Cane - single point;Shower seat;BSC/3in1   Additional Comments: reports friend phil helps her out at home daily.  All information above taken from prior hospitalization and frm Phil      Prior Functioning/Environment Prior Level of Function : Needs assist             Mobility Comments: uses RW ADLs Comments: Ind with ADLs/selfcare, bathes at sink; friend [employer], drives, grocery shops    OT Problem List: Decreased strength;Decreased activity tolerance;Impaired balance (sitting and/or  standing);Decreased safety awareness;Decreased cognition;Decreased knowledge of use of DME or AE   OT Treatment/Interventions: Self-care/ADL training;Therapeutic activities;Patient/family education;Balance training;DME and/or AE instruction      OT Goals(Current goals can be found in the care plan section)   Acute Rehab OT Goals Patient Stated Goal: none stated OT Goal Formulation: With patient Time For Goal Achievement: 01/17/24 Potential to Achieve Goals: Good ADL Goals Pt Will Perform Grooming: with supervision;with set-up;standing Pt Will Perform Upper Body Bathing: with contact guard assist;with supervision;sitting Pt Will Perform Lower Body Bathing: with mod assist;with min assist;sitting/lateral leans;sit to/from stand Pt Will Perform Upper Body Dressing: with contact guard assist;with supervision;sitting Pt Will Transfer to Toilet: with contact guard assist;with supervision;ambulating Pt Will Perform Toileting - Clothing Manipulation and hygiene: with min assist;with contact guard assist;sitting/lateral leans;sit to/from stand   OT Frequency:  Min 2X/week    Co-evaluation              AM-PAC OT 6 Clicks Daily Activity     Outcome Measure Help from another person eating meals?: None Help from another person taking care of personal grooming?: A Little Help from another person toileting, which includes using toliet, bedpan, or urinal?: A Little Help from another person bathing (including washing, rinsing, drying)?: A Lot Help from another person to put on and taking off regular upper body clothing?: A Little Help from another person to put on and taking off regular lower body clothing?: A Lot 6 Click Score: 17   End of Session Equipment Utilized During Treatment: Gait belt;Rolling walker (2 wheels) Nurse  Communication: Mobility status  Activity Tolerance: Patient tolerated treatment well Patient left: in chair;with call bell/phone within reach;with chair alarm  set;with family/visitor present  OT Visit Diagnosis: Unsteadiness on feet (R26.81);Other abnormalities of gait and mobility (R26.89);Other symptoms and signs involving cognitive function;Muscle weakness (generalized) (M62.81)                Time: 8483-8458 OT Time Calculation (min): 25 min Charges:  OT General Charges $OT Visit: 1 Visit OT Evaluation $OT Eval Moderate Complexity: 1 Mod OT Treatments $Therapeutic Activity: 8-22 mins   Jacques Karna Loose 01/03/2024, 3:57 PM

## 2024-01-03 NOTE — Plan of Care (Signed)
" °  Problem: Respiratory: Goal: Levels of oxygenation will improve Outcome: Progressing   Problem: Pain Managment: Goal: General experience of comfort will improve and/or be controlled Outcome: Progressing   Problem: Elimination: Goal: Will not experience complications related to urinary retention Outcome: Progressing   Problem: Clinical Measurements: Goal: Respiratory complications will improve Outcome: Progressing   Problem: Clinical Measurements: Goal: Cardiovascular complication will be avoided Outcome: Progressing   "

## 2024-01-04 DIAGNOSIS — J9601 Acute respiratory failure with hypoxia: Secondary | ICD-10-CM | POA: Diagnosis not present

## 2024-01-04 LAB — GLUCOSE, CAPILLARY
Glucose-Capillary: 168 mg/dL — ABNORMAL HIGH (ref 70–99)
Glucose-Capillary: 195 mg/dL — ABNORMAL HIGH (ref 70–99)
Glucose-Capillary: 203 mg/dL — ABNORMAL HIGH (ref 70–99)
Glucose-Capillary: 155 mg/dL — ABNORMAL HIGH (ref 70–99)

## 2024-01-04 LAB — CBC WITH DIFFERENTIAL/PLATELET
Abs Immature Granulocytes: 0.07 K/uL (ref 0.00–0.07)
Basophils Absolute: 0.1 K/uL (ref 0.0–0.1)
Basophils Relative: 0 %
Eosinophils Absolute: 0.1 K/uL (ref 0.0–0.5)
Eosinophils Relative: 1 %
HCT: 36.6 % (ref 36.0–46.0)
Hemoglobin: 11.4 g/dL — ABNORMAL LOW (ref 12.0–15.0)
Immature Granulocytes: 1 %
Lymphocytes Relative: 13 %
Lymphs Abs: 1.9 K/uL (ref 0.7–4.0)
MCH: 24.9 pg — ABNORMAL LOW (ref 26.0–34.0)
MCHC: 31.1 g/dL (ref 30.0–36.0)
MCV: 79.9 fL — ABNORMAL LOW (ref 80.0–100.0)
Monocytes Absolute: 1 K/uL (ref 0.1–1.0)
Monocytes Relative: 7 %
Neutro Abs: 11.4 K/uL — ABNORMAL HIGH (ref 1.7–7.7)
Neutrophils Relative %: 78 %
Platelets: 407 K/uL — ABNORMAL HIGH (ref 150–400)
RBC: 4.58 MIL/uL (ref 3.87–5.11)
RDW: 15.6 % — ABNORMAL HIGH (ref 11.5–15.5)
WBC: 14.4 K/uL — ABNORMAL HIGH (ref 4.0–10.5)
nRBC: 0 % (ref 0.0–0.2)

## 2024-01-04 MED ORDER — IPRATROPIUM-ALBUTEROL 0.5-2.5 (3) MG/3ML IN SOLN
3.0000 mL | RESPIRATORY_TRACT | Status: DC | PRN
  Filled 2024-01-04: qty 3

## 2024-01-04 MED ORDER — HALOPERIDOL LACTATE 5 MG/ML IJ SOLN
2.0000 mg | Freq: Four times a day (QID) | INTRAMUSCULAR | Status: DC | PRN
  Administered 2024-01-04: 2 mg via INTRAVENOUS

## 2024-01-04 MED ORDER — GLUCERNA SHAKE PO LIQD
237.0000 mL | Freq: Two times a day (BID) | ORAL | Status: DC
  Administered 2024-01-04 – 2024-01-09 (×11): 237 mL via ORAL
  Filled 2024-01-04 (×12): qty 237

## 2024-01-04 MED ORDER — QUETIAPINE FUMARATE 50 MG PO TABS
50.0000 mg | ORAL_TABLET | Freq: Every day | ORAL | Status: DC
  Administered 2024-01-04 – 2024-01-08 (×5): 50 mg via ORAL
  Filled 2024-01-04 (×5): qty 1

## 2024-01-04 MED ORDER — LORAZEPAM 2 MG/ML IJ SOLN
0.5000 mg | Freq: Once | INTRAMUSCULAR | Status: AC
  Administered 2024-01-04: 0.5 mg via INTRAVENOUS
  Filled 2024-01-04: qty 1

## 2024-01-04 MED ORDER — LEVOFLOXACIN 500 MG PO TABS
250.0000 mg | ORAL_TABLET | Freq: Every day | ORAL | Status: AC
  Administered 2024-01-04 – 2024-01-06 (×3): 250 mg via ORAL
  Filled 2024-01-04 (×4): qty 1

## 2024-01-04 MED ORDER — HALOPERIDOL LACTATE 5 MG/ML IJ SOLN
INTRAMUSCULAR | Status: AC
  Filled 2024-01-04: qty 1

## 2024-01-04 NOTE — Progress Notes (Signed)
 Patient has remained confused and has had two episodes of agitation and not following commands so far today. Patient not redirectable and requiring 2+ assist to be guided back to bed. Unable to advance mobility out of bed due to non-cooperation. After first episode, one time ativan  given which helped calm patient and she rested for a while. Second episode occurred after visit with friend at bedside. Patient trying to crawl out of bed without assistance and demanding to go home, does not believe she is in the hospital. Security team called to help get patient back into bed. MD made aware, orders for haldol  given as well as changes made to current meds. RN remaining at bedside at this time.

## 2024-01-04 NOTE — TOC Initial Note (Signed)
 Transition of Care [facility]) - Initial/Assessment Note    Patient Details  Name: Allison Hughes MRN: [id number] Date of Birth: Dec 16, 1950  [facility] CM/SW Contact:    Sonda Manuella Quill, RN Phone Number: 01/04/2024, 5:24 PM  Clinical Narrative:                 Pt oriented x 1 to self; spoke w/ friend Manus Cong [phone]); he said pt previously lived at home; she was d/c'd to [facility] but never admitted b/c she was bought back to hospital; he plans for her to return to [facility] at d/c; Mr Cong said pt does not have family members, and he completed paperwork for admit to facility; PCP/insurance verified; he denied pt experiencing SDOH risks; she has cane, and walker; he said pt had access to home oxygen but never used it; IP CM is following.  Expected Discharge Plan: Skilled Nursing Facility Barriers to Discharge: Continued Medical Work up   Patient Goals and CMS Choice Patient states their goals for this hospitalization and ongoing recovery are:: pt's friend Manus Cong said she will d/c to  SNF          Expected Discharge Plan and Services   Discharge Planning Services: CM Consult   Living arrangements for the past 2 months: Single Family Home                 DME Arranged: N/A DME Agency: NA       HH Arranged: NA HH Agency: NA        Prior Living Arrangements/Services Living arrangements for the past 2 months: Single Family Home Lives with:: Self Patient language and need for interpreter reviewed:: Yes Do you feel safe going back to the place where you live?: Yes      Need for Family Participation in Patient Care: Yes (Comment) Care giver support system in place?: Yes (comment) Current home services: DME (cane, walker)    Activities of Daily Living   ADL Screening (condition at time of admission) Independently performs ADLs?: Yes (appropriate for developmental age) Is the patient deaf or have difficulty hearing?:  No Does the patient have difficulty seeing, even when wearing glasses/contacts?: No Does the patient have difficulty concentrating, remembering, or making decisions?: Yes  Permission Sought/Granted Permission sought to share information with : Case Manager Permission granted to share information with : Yes, Verbal Permission Granted  Share Information with NAME: Case Manager     Permission granted to share info w Relationship: Manus Cong (friend) [phone]     Emotional Assessment Appearance:: Appears stated age Attitude/Demeanor/Rapport: Unable to Assess Affect (typically observed): Unable to Assess Orientation: :  (unable to assess) Alcohol / Substance Use: Not Applicable Psych Involvement: No (comment)  Admission diagnosis:  Acute respiratory failure with hypoxia (HCC) [J96.01] Patient Active Problem List   Diagnosis Date Noted   Palliative care encounter 01/03/2024   Goals of care, counseling/discussion 01/03/2024   Counseling and coordination of care 01/03/2024   Acute respiratory failure with hypoxia (HCC) 01/02/2024   Acute cystitis with hematuria 12/29/2023   Generalized weakness 12/29/2023   Failure to thrive in adult 12/29/2023   Recurrent falls 12/29/2023   Chronic obstructive pulmonary disease (HCC) 12/29/2023   Acute metabolic encephalopathy 12/28/2023   Acute encephalopathy 11/29/2023   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   GERD (gastroesophageal reflux disease) 11/29/2023   Chronic heart failure with preserved ejection fraction (HFpEF) (HCC) 10/20/2023   Fall at home, initial encounter 10/20/2023  Hyperlipidemia 10/20/2023   History of gastric ulcer 10/20/2023   ABLA (acute blood loss anemia) 09/19/2023   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   Neurocognitive disorder 08/31/2023   Impaired mobility and ADLs 08/20/2023   Anemia 09/02/2022   Aphasia 06/03/2022   History of stroke 09/06/2020   DM (diabetes mellitus), type 2 (HCC) 11/20/2018    Hypokalemia 02/02/2016   Anxiety 02/02/2016   Essential hypertension 02/02/2016   PCP:  Valma Carwin, MD Pharmacy:   [facility] - [place], [place] - [address] [place] [address] Phone: [phone] Fax: [phone]  [facility] - [place], [place] - [address] [facility] [address] [place] [address] [place] [address] Phone: [phone] Fax: [phone]  [facility] [address] [place] [address] Phone: [phone] Fax: [phone]     Social Drivers of Health (SDOH) Social History: SDOH Screenings   Food Insecurity: No Food Insecurity (01/04/2024)  Housing: Low Risk (01/04/2024)  Transportation Needs: No Transportation Needs (01/04/2024)  Utilities: Not At Risk (01/04/2024)  Social Connections: Unknown (01/02/2024)  Tobacco Use: High Risk (01/02/2024)   SDOH Interventions: Food Insecurity Interventions: Intervention Not Indicated, Inpatient TOC Housing Interventions: Intervention Not Indicated, Inpatient TOC Transportation Interventions: Intervention Not Indicated, Inpatient TOC Utilities Interventions: Intervention Not Indicated, Inpatient TOC   Readmission Risk Interventions    01/04/2024    5:20 PM 10/24/2023    2:14 PM  Readmission Risk Prevention Plan  Transportation Screening Complete Complete  PCP or Specialist Appt within 3-5 Days  Complete  HRI or Home Care Consult  Complete  Social Work Consult for Recovery Care Planning/Counseling  Complete  Palliative Care Screening  Complete  Medication Review Oceanographer) Complete Referral to Pharmacy  PCP or Specialist appointment within 3-5 days of discharge Complete   HRI or Home Care Consult Complete   SW Recovery Care/Counseling Consult Complete   Palliative Care Screening Not Applicable   Skilled Nursing Facility Complete

## 2024-01-04 NOTE — Progress Notes (Addendum)
 Initial Nutrition Assessment  DOCUMENTATION CODES:   Not applicable  INTERVENTION:  Continue DYS3 diet for ease of consumption Encourage PO intake Feeding assistance, if indicated Automatic meal trays Add Glucerna Shake po BID, each supplement provides 220 kcal and 10 grams of protein Add Magic cup TID with meals, each supplement provides 290 kcal and 9 grams of protein  Collect new weight to assess recent weight trend Needs new lab panel including A1c, adjust insulin  regimen as indicated by blood sugar trend  No labs ordered currently, recommend addition of A1c when they are to avoid redundant lab sticks Monitor bowel frequency and modify/schedule bowel regimen, if indicated  Monitor GOC discussion and modify plan of care as indicated   NUTRITION DIAGNOSIS:  Increased nutrient needs related to acute illness as evidenced by estimated needs.  GOAL:   Patient will meet greater than or equal to 90% of their needs  MONITOR:   PO intake, Supplement acceptance, Weight trends  REASON FOR ASSESSMENT:   Consult Assessment of nutrition requirement/status  ASSESSMENT:   Pt with PMH significant for: dementia, T2DM, HTN, HLD, stroke, COPD, HFpEF, GIB and recurrent falls. Admitted from 12/14-12/18 and, upon arriving to SNF was refused due to patient being hypoxic and with AMS. Original admission for AMS 2/2 Citrobacter UTI and now admitted for AMS possibly 2/2 aspiration PNA.   Last time patient was seen by RD was during admission to [facility] in October of this year for the same reason (AMS in setting of UTI). She remains an unreliable historian and only oriented to self. RD working remotely and, thus, completed chart review in lieu of nutrition-focused interview. Remains at high risk for nutrition-related decline related to cognitive status.   Average Meal Intake 12/20: 50% x1, 100% x2 documented meals 12/21: 50% x1 documented meal  Patient intake appears adequate at this  time. Last bowel movement documented on 12/16 with current RN not able to verify last BM. Monitor bowel trends and recommend scheduled bowel regimen if no BM in 1-2 days.   Admit/Current Weight: 68 kg  Per chart review, appears weight has been pulled forward x3-4 encounters. Needs new weight to assess trend. If current weight accurate, this illustrates 15.7% weight loss in last three months, which is considered clinically significant for the time frame. No significant edema noted.   Labs: WBC 24.2>14.4 (H) CBGs 141-183 x24 hours (12/18) A1c 6.9 (09/20250   Meds: SS Novolog , Lantus , levofloxacin , lisinopril    NUTRITION - FOCUSED PHYSICAL EXAM: Defer to in person follow up, may meet criteria in conjunction with weight trend noted above.  Diet Order:   Diet Order             DIET DYS 3 Room service appropriate? Yes; Fluid consistency: Thin  Diet effective now                   EDUCATION NEEDS:   Not appropriate for education at this time  Skin:  Skin Assessment: Reviewed RN Assessment  Last BM:  12/16  Height:  Ht Readings from Last 1 Encounters:  01/02/24 5' 7 (1.702 m)   Weight:  Wt Readings from Last 1 Encounters:  01/02/24 68 kg   Ideal Body Weight:  61.4 kg  BMI:  Body mass index is 23.48 kg/m.  Estimated Nutritional Needs:   Kcal:  1500-1700 kcals  Protein:  70-85g  Fluid:  >1.5L/day  Blair Deaner MS, RD, LDN Registered Dietitian Clinical Nutrition RD Inpatient Contact Info in Amion

## 2024-01-04 NOTE — Progress Notes (Signed)
 Patient is now more calm after haldol  but still uncooperative. Attempted to give PO levaquin , but refuses at this time. Med rescheduled to 10pm med pass.

## 2024-01-04 NOTE — Progress Notes (Signed)
 " PROGRESS NOTE    Allison Hughes  FMW:[id number] DOB: 12-29-1950 DOA: 01/01/2024 PCP: Valma Carwin, MD    Brief Narrative:  73 year old with dementia, type 2 diabetes, hypertension, hyperlipidemia, previous stroke, COPD, heart failure with preserved ejection fraction, aortic mural thrombus, history of GI bleed and recurrent fall who was admitted to the hospital 12/14-12/18/25 with altered mental status apparently secondary to Citrobacter UTI.  She was thought to be encephalopathic from Xanax , Seroquel  also.  Patient was discharged to SNF later in the night, they did not take her admission because she was hypoxic and altered.  She was sent back to the hospital, brought to [facility].   On arrival to the emergency room, patient was arousable but unable to give any history.  Blood pressure stable.  Oxygen stable on 2 L oxygen supplementation.  WBC count 21.8 with normal WBC count in the morning before discharge. Blood gas and electrolytes fairly stable. Troponins are normal. CT scan abdomen pelvis without any acute abnormalities. CT chest without any acute abnormality, known mural thrombus. Respiratory virus panel, RSV and flu negative. Blood glucose adequate. Admitted due to altered mentation, possible aspiration pneumonia, hypoxemia.  Subjective:  Patient seen and examined.  On my morning rounds, patient was comfortable alert awake and mostly oriented.  Later on, she had episode of impulsiveness.  Patient told me she might have aspiration pneumonia.  She is agreeable to go to rehab.  12/20, met with patient's friend Aleene at the bedside who suggested that patients mental status has improved.   Assessment & Plan:   Altered mental status in a patient with known cognitive decline, recently hospitalized. UTI with Citrobacter.   Currently no focal deficits. Respiratory  virus panel negative. Brain MRI with chronic encephalomalacia.  No acute stroke.   No evidence of CO2  narcosis. No evidence of consolidation. Probably her baseline. Low-dose Xanax  as she takes it as a long-term for anxiety.  Patient was on 50 mg Seroquel  at home that was recently reduced.  Will increase to 25 mg tonight.  Citrobacter, will treat with Levaquin . Remains full code. Significant leukocytosis, will continue antibiotics today.  Leukocytosis improving. Keep on oxygen to keep saturation more than 90%. Procalcitonin less than 0.1.  B12 507.  A.m. cortisol 6.  TSH 0.5.  Acute hypoxemic respiratory failure: Likely secondary to on and off sleepiness.  There was no evidence of pneumonia or CO2 retention.  Keep on oxygen to keep saturation more than 90%.  Will probably need oxygen supplementation.      DVT prophylaxis: heparin  injection 5,000 Units Start: 01/02/24 1400   Code Status: Full code.   Family Communication: None available.  Patient's friend Mr. Aleene at the bedside 12/20. Disposition Plan: Status is: Inpatient Remains inpatient appropriate because: Unsafe discharge planning.  Medically stable to transfer to SNF.     Consultants:  Palliative care  Procedures:  None  Antimicrobials:  Levaquin  12/19---     Objective: Vitals:   01/03/24 2003 01/03/24 2121 01/04/24 0511 01/04/24 1038  BP: 126/65  123/65   Pulse: 83  81   Resp: (!) 21  19   Temp: (!) 97.5 F (36.4 C)  98.4 F (36.9 C)   TempSrc: Oral  Oral   SpO2: 98% 96% 96% (!) 83%  Weight:      Height:        Intake/Output Summary (Last 24 hours) at 01/04/2024 1134 Last data filed at 01/04/2024 0900 Gross per 24 hour  Intake 775.63 ml  Output 1000 ml  Net -224.37 ml   Filed Weights   01/02/24 1821  Weight: 68 kg    Examination:  General exam: Appears calm and comfortable at rest.  Frail.  Mostly alert awake.  She has some cognitive delay. Patient is alert awake and oriented to herself and situation.  Moves all extremities but grossly weak. Respiratory system: Clear to auscultation.  Respiratory effort normal.  No added sounds. Cardiovascular system: S1 & S2 heard, RRR.  No pedal edema. Gastrointestinal system: Soft.  Nontender.  Bowel sound present.   Data Reviewed: I have personally reviewed following labs and imaging studies  CBC: Recent Labs  Lab 12/28/23 1815 12/29/23 0424 12/31/23 0416 01/01/24 0504 01/01/24 2355 01/02/24 1802 01/04/24 0431  WBC 10.6*   < > 6.0 9.0 21.8* 24.2* 14.4*  NEUTROABS 8.2*  --   --   --  20.6* 22.9* 11.4*  HGB 11.7*   < > 10.7* 11.7* 12.1 10.9* 11.4*  HCT 38.1   < > 33.4* 36.3 39.1 34.7* 36.6  MCV 79.5*   < > 78.6* 77.7* 79.6* 78.9* 79.9*  PLT 351   < > 334 369 429* 397 407*   < > = values in this interval not displayed.   Basic Metabolic Panel: Recent Labs  Lab 12/29/23 0424 12/30/23 0452 12/31/23 0416 01/01/24 0504 01/01/24 2355  NA 141 143 142 139 138  K 3.5 3.2* 4.4 4.3 4.4  CL 108 110 111 106 104  CO2 26 27 23 23 23   GLUCOSE 132* 100* 113* 141* 183*  BUN 13 8 9 9 15   CREATININE 0.49 0.49 0.53 0.66 0.61  CALCIUM  8.8* 8.7* 9.4 10.0 9.9   GFR: Estimated Creatinine Clearance: 61.8 mL/min (by C-G formula based on SCr of 0.61 mg/dL). Liver Function Tests: Recent Labs  Lab 12/28/23 1815  AST 15  ALT 13  ALKPHOS 123  BILITOT 0.5  PROT 6.0*  ALBUMIN 2.8*   Recent Labs  Lab 01/01/24 2355  LIPASE 12   No results for input(s): AMMONIA in the last 168 hours. Coagulation Profile: No results for input(s): INR, PROTIME in the last 168 hours. Cardiac Enzymes: Recent Labs  Lab 12/28/23 1815  CKTOTAL 46   BNP (last 3 results) Recent Labs    01/01/24 2355  PROBNP 80.6   HbA1C: No results for input(s): HGBA1C in the last 72 hours. CBG: Recent Labs  Lab 01/03/24 1217 01/03/24 1701 01/03/24 2136 01/04/24 0721 01/04/24 1126  GLUCAP 302* 276* 222* 168* 195*   Lipid Profile: No results for input(s): CHOL, HDL, LDLCALC, TRIG, CHOLHDL, LDLDIRECT in the last 72 hours. Thyroid  Function Tests: Recent Labs    01/02/24 1630  TSH 0.542   Anemia Panel: Recent Labs    01/02/24 1630  VITAMINB12 507  FOLATE 6.3   Sepsis Labs: Recent Labs  Lab 01/01/24 2359 01/02/24 0152 01/02/24 1630  PROCALCITON  --   --  <0.10  LATICACIDVEN 1.2 1.1  --     Recent Results (from the past 240 hours)  Urine Culture     Status: Abnormal   Collection Time: 12/28/23  7:41 PM   Specimen: Urine, Random  Result Value Ref Range Status   Specimen Description URINE, RANDOM  Final   Special Requests   Final    URINE, CLEAN CATCH Performed at [facility], [address], [place], [place] [address]    Culture >=100,000 COLONIES/mL CITROBACTER FREUNDII (A)  Final   Report Status 12/30/2023 FINAL  Final   Organism ID, Bacteria CITROBACTER FREUNDII (A)  Final      Susceptibility   Citrobacter freundii - MIC*    CEFEPIME <=0.12 SENSITIVE Sensitive     ERTAPENEM <=0.12 SENSITIVE Sensitive     CEFTRIAXONE  8 RESISTANT Resistant     CIPROFLOXACIN <=0.06 SENSITIVE Sensitive     GENTAMICIN <=1 SENSITIVE Sensitive     NITROFURANTOIN <=16 SENSITIVE Sensitive     TRIMETH /SULFA  <=20 SENSITIVE Sensitive     PIP/TAZO Value in next row Sensitive      <=4 SENSITIVEThis is a modified FDA-approved test that has been validated and its performance characteristics determined by the reporting laboratory.  This laboratory is certified under the Clinical Laboratory Improvement Amendments CLIA as qualified to perform high complexity clinical laboratory testing.    MEROPENEM Value in next row Sensitive      <=4 SENSITIVEThis is a modified FDA-approved test that has been validated and its performance characteristics determined by the reporting laboratory.  This laboratory is certified under the Clinical Laboratory Improvement Amendments CLIA as qualified to perform high complexity clinical laboratory testing.    * >=100,000 COLONIES/mL CITROBACTER FREUNDII  Culture, blood (routine x 2)     Status:  None (Preliminary result)   Collection Time: 01/02/24 12:58 AM   Specimen: BLOOD  Result Value Ref Range Status   Specimen Description   Final    BLOOD BLOOD RIGHT ARM Performed at [facility], [address], [place], [place] [address]    Special Requests   Final    BOTTLES DRAWN AEROBIC AND ANAEROBIC Blood Culture results may not be optimal due to an inadequate volume of blood received in culture bottles Performed at [facility], [address] [place], [place] [address]    Culture   Final    NO GROWTH 2 DAYS Performed at [facility], [address] [place], [place] [address]    Report Status PENDING  Incomplete  Respiratory (~20 pathogens) panel by PCR     Status: None   Collection Time: 01/02/24  2:59 AM   Specimen: Nasopharyngeal Swab; Respiratory  Result Value Ref Range Status   Adenovirus NOT DETECTED NOT DETECTED Final   Coronavirus 229E NOT DETECTED NOT DETECTED Final    Comment: (NOTE) The Coronavirus on the Respiratory Panel, DOES NOT test for the novel  Coronavirus (2019 nCoV)    Coronavirus HKU1 NOT DETECTED NOT DETECTED Final   Coronavirus NL63 NOT DETECTED NOT DETECTED Final   Coronavirus OC43 NOT DETECTED NOT DETECTED Final   Metapneumovirus NOT DETECTED NOT DETECTED Final   Rhinovirus / Enterovirus NOT DETECTED NOT DETECTED Final   Influenza A NOT DETECTED NOT DETECTED Final   Influenza B NOT DETECTED NOT DETECTED Final   Parainfluenza Virus 1 NOT DETECTED NOT DETECTED Final   Parainfluenza Virus 2 NOT DETECTED NOT DETECTED Final   Parainfluenza Virus 3 NOT DETECTED NOT DETECTED Final   Parainfluenza Virus 4 NOT DETECTED NOT DETECTED Final   Respiratory Syncytial Virus NOT DETECTED NOT DETECTED Final   Bordetella pertussis NOT DETECTED NOT DETECTED Final   Bordetella Parapertussis NOT DETECTED NOT DETECTED Final   Chlamydophila pneumoniae NOT DETECTED NOT DETECTED Final   Mycoplasma pneumoniae NOT DETECTED NOT  DETECTED Final    Comment: Performed at [facility], [address], [place], [place] [address]  Culture, blood (routine x 2)     Status: None (Preliminary result)   Collection Time: 01/02/24  4:30 PM  Specimen: BLOOD RIGHT ARM  Result Value Ref Range Status   Specimen Description   Final    BLOOD RIGHT ARM Performed at [facility], [address] [place], [place] [address]    Special Requests   Final    BOTTLES DRAWN AEROBIC ONLY Blood Culture adequate volume Performed at [facility], [address], [place], [place] [address]    Culture   Final    NO GROWTH 2 DAYS Performed at [facility], [address] [place], [place] [address]    Report Status PENDING  Incomplete         Radiology Studies: MR BRAIN WO CONTRAST Result Date: 01/02/2024 EXAM: MRI BRAIN WITHOUT CONTRAST 01/02/2024 12:37:35 PM TECHNIQUE: Multiplanar multisequence MRI of the head/brain was performed without the administration of intravenous contrast. COMPARISON: MRI of the head dated 10/20/2023. CLINICAL HISTORY: Mental status change, unknown cause. FINDINGS: BRAIN AND VENTRICLES: No acute infarct. No intracranial hemorrhage. No mass. No midline shift. No hydrocephalus. Age-related atrophy. Moderately advanced diffuse cerebral white matter disease. Focal encephalomalacia changes within the right parietal lobe and within the left inferior cerebellar hemisphere. The sella is unremarkable. Normal flow voids. ORBITS: No acute abnormality. SINUSES AND MASTOIDS: No acute abnormality. BONES AND SOFT TISSUES: Normal marrow signal. No acute soft tissue abnormality. IMPRESSION: 1. No acute intracranial abnormality. 2. Age-related atrophy and moderately advanced diffuse cerebral white matter disease. 3. Focal encephalomalacia changes within the right parietal lobe and within the left inferior cerebellar hemisphere. Electronically signed by: Evalene Coho MD 01/02/2024 12:42  PM EST RP Workstation: [id number]        Scheduled Meds:  arformoterol   15 mcg Nebulization BID   aspirin  EC  81 mg Oral Daily   atorvastatin   40 mg Oral Daily   budesonide  (PULMICORT ) nebulizer solution  0.25 mg Nebulization BID   heparin   5,000 Units Subcutaneous Q8H   insulin  aspart  0-6 Units Subcutaneous TID WC   insulin  glargine  10 Units Subcutaneous Daily   levofloxacin   250 mg Oral q1800   lisinopril   20 mg Oral Daily   QUEtiapine   12.5 mg Oral QHS   Continuous Infusions:     LOS: 2 days    Time spent: 51 minutes    Renato Applebaum, MD [facility]   "

## 2024-01-05 LAB — GLUCOSE, CAPILLARY
Glucose-Capillary: 160 mg/dL — ABNORMAL HIGH (ref 70–99)
Glucose-Capillary: 221 mg/dL — ABNORMAL HIGH (ref 70–99)
Glucose-Capillary: 216 mg/dL — ABNORMAL HIGH (ref 70–99)
Glucose-Capillary: 265 mg/dL — ABNORMAL HIGH (ref 70–99)

## 2024-01-05 MED ORDER — PANTOPRAZOLE SODIUM 40 MG PO TBEC
40.0000 mg | DELAYED_RELEASE_TABLET | Freq: Every day | ORAL | Status: DC
  Administered 2024-01-05 – 2024-01-09 (×5): 40 mg via ORAL
  Filled 2024-01-05 (×5): qty 1

## 2024-01-05 NOTE — Progress Notes (Signed)
 Mobility Specialist Progress Note:   01/05/24 1130  Mobility  Activity Pivoted/transferred from bed to chair  Level of Assistance +2 (takes two people)  Assistive Device  (HHA)  Distance Ambulated (ft) 2 ft  Activity Response Tolerated fair  Mobility Referral Yes  Mobility visit 1 Mobility  Mobility Specialist Start Time (ACUTE ONLY) 1045  Mobility Specialist Stop Time (ACUTE ONLY) 1103  Mobility Specialist Time Calculation (min) (ACUTE ONLY) 18 min   Pt was received in bed and agreeable to mobilize. +2 Mod A w/ help from RN. Returned to recliner with all needs met. Left in room with RN.  [employer] - Mobility Specialist

## 2024-01-05 NOTE — Progress Notes (Signed)
 "                                                                                                                                                                                                          Daily Progress Note   Patient Name: Allison Hughes       Date: 01/05/2024 DOB: 09/06/1950  Age: 73 y.o. MRN#: [id number] Attending Physician: Raenelle Coria, MD Primary Care Physician: Valma Carwin, MD Admit Date: 01/01/2024  Reason for Consultation/Follow-up: Establishing goals of care  Subjective:  confused  Length of Stay: 3  Current Medications: Scheduled Meds:   arformoterol   15 mcg Nebulization BID   aspirin  EC  81 mg Oral Daily   atorvastatin   40 mg Oral Daily   budesonide  (PULMICORT ) nebulizer solution  0.25 mg Nebulization BID   feeding supplement (GLUCERNA SHAKE)  237 mL Oral BID BM   heparin   5,000 Units Subcutaneous Q8H   insulin  aspart  0-6 Units Subcutaneous TID WC   insulin  glargine  10 Units Subcutaneous Daily   levofloxacin   250 mg Oral q1800   lisinopril   20 mg Oral Daily   QUEtiapine   50 mg Oral QHS    Continuous Infusions:   PRN Meds: acetaminophen , acetaminophen , ALPRAZolam , haloperidol  lactate, ipratropium-albuterol , ondansetron  (ZOFRAN ) IV  Physical Exam         Elderly lady resting in bed Confused Has some cognitive delay Appears with generalized weakness  Vital Signs: BP 132/62 (BP Location: Left Arm)   Pulse 86   Temp 98 F (36.7 C) (Oral)   Resp 17   Ht 5' 7 (1.702 m)   Wt 61.6 kg   SpO2 93%   BMI 21.27 kg/m  SpO2: SpO2: 93 % O2 Device: O2 Device: Nasal Cannula O2 Flow Rate: O2 Flow Rate (L/min): 2 L/min  Intake/output summary:  Intake/Output Summary (Last 24 hours) at 01/05/2024 1330 Last data filed at 01/05/2024 9171 Gross per 24 hour  Intake 240 ml  Output 750 ml  Net -510 ml   LBM: Last BM Date : 01/04/24 Baseline Weight: Weight: 68 kg Most recent weight: Weight: 61.6 kg       Palliative  Assessment/Data:      Patient Active Problem List   Diagnosis Date Noted   Palliative care encounter 01/03/2024   Goals of care, counseling/discussion 01/03/2024   Counseling and coordination of care 01/03/2024   Acute respiratory failure with hypoxia (HCC) 01/02/2024   Acute cystitis with hematuria 12/29/2023   Generalized weakness 12/29/2023   Failure to thrive in adult 12/29/2023  Recurrent falls 12/29/2023   Chronic obstructive pulmonary disease (HCC) 12/29/2023   Acute metabolic encephalopathy 12/28/2023   Acute encephalopathy 11/29/2023   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   GERD (gastroesophageal reflux disease) 11/29/2023   Chronic heart failure with preserved ejection fraction (HFpEF) (HCC) 10/20/2023   Fall at home, initial encounter 10/20/2023   Hyperlipidemia 10/20/2023   History of gastric ulcer 10/20/2023   ABLA (acute blood loss anemia) 09/19/2023   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   Neurocognitive disorder 08/31/2023   Impaired mobility and ADLs 08/20/2023   Anemia 09/02/2022   Aphasia 06/03/2022   History of stroke 09/06/2020   DM (diabetes mellitus), type 2 (HCC) 11/20/2018   Hypokalemia 02/02/2016   Anxiety 02/02/2016   Essential hypertension 02/02/2016    Palliative Care Assessment & Plan   Patient Profile:    Assessment:  Patient is a 73 year old female with a past medical history of dementia, type 2 diabetes mellitus, hypertension, hyperlipidemia, prior CVA, COPD, HFpEF, aortic mural thrombus, history of GI bleed, and recurrent falls who was admitted on 01/01/2024 with hypoxia and altered mental status.  Patient had just been discharged from being admitted on 12/14-12/18/25 for management of altered mental status secondary to Citrobacter UTI and when patient presented to SNF in the evening of 01/01/2024, they did not take her admission due to hypoxia and altered mental status.  Since hospitalization, patient receiving management for  acute hypoxic respiratory failure and altered mental status.  Palliative medicine team consulted to assist with complex medical decision making.   Recommendations/Plan: Palliative care continues to follow for CODE STATUS and broad goals of care discussions.  Discussed with [name] patient's friend, remains confused.  She does have a brother who lives in [place] .  Patient apparently was living at home and then was hospitalized at [facility] was supposed to go to [facility] for rehab and then was brought to this hospital. Full code for now SNF rehab with palliative on discharge. In my opinion, patient does not have capacity to give us  clear directions on whether or not she would want her brother contacted.  Goals of Care and Additional Recommendations: Limitations on Scope of Treatment: Full Scope Treatment  Code Status:    Code Status Orders  (From admission, onward)           Start     Ordered   01/02/24 1840  Full code  (Code Status)  Continuous       Question:  By:  Answer:  Consent: discussion documented in EHR   01/02/24 1839           Code Status History     Date Active Date Inactive Code Status Order ID Comments User Context   12/28/2023 2201 01/01/2024 2307 Full Code [id number]  Lou Claretta HERO, MD ED   11/29/2023 1824 12/04/2023 1911 Full Code [id number]  Claudene Maximino LABOR, MD ED   10/20/2023 1616 11/11/2023 1825 Full Code [id number]  Claudene Maximino LABOR, MD Inpatient   09/19/2023 2320 09/24/2023 2131 Full Code [id number]  Dorinda Drue DASEN, MD ED   09/02/2022 1237 09/03/2022 1532 Full Code [id number]  Seena Marsa NOVAK, MD ED   06/03/2022 2225 06/07/2022 1827 Full Code [id number]  Shona Terry SAILOR, DO ED   09/06/2020 1647 09/08/2020 2226 Full Code [id number]  Lanetta Lingo, MD ED   02/02/2016 0134 02/05/2016 1734 Full Code [id number]  Jonel Lonni SQUIBB, MD Inpatient   05/11/2011 1937 05/12/2011 2105 Full Code [id number]  Rojelio Montie BIRCH,  RN Inpatient        Prognosis:  Unable to determine  Discharge Planning: Skilled Nursing Facility for rehab with Palliative care service follow-up  Care plan was discussed with patient, friend Aleene   Thank you for allowing the Palliative Medicine Team to assist in the care of this patient. High MDM.      Greater than 50%  of this time was spent counseling and coordinating care related to the above assessment and plan.  Lonia Serve, MD  Please contact Palliative Medicine Team phone at [phone] for questions and concerns.       "

## 2024-01-05 NOTE — Progress Notes (Signed)
 " PROGRESS NOTE    Allison Hughes  FMW:[id number] DOB: 1950/12/14 DOA: 01/01/2024 PCP: Valma Carwin, MD    Brief Narrative:  73 year old with dementia, type 2 diabetes, hypertension, hyperlipidemia, previous stroke, COPD, heart failure with preserved ejection fraction, aortic mural thrombus, history of GI bleed and recurrent fall who was admitted to the hospital 12/14-12/18/25 with altered mental status apparently secondary to Citrobacter UTI.  She was thought to be encephalopathic from Xanax , Seroquel  also.  Patient was discharged to SNF later in the night, they did not take her admission because she was hypoxic and altered.  She was sent back to the hospital, brought to [facility].   On arrival to the emergency room, patient was arousable but unable to give any history.  Blood pressure stable.  Oxygen stable on 2 L oxygen supplementation.  WBC count 21.8 with normal WBC count in the morning before discharge. Blood gas and electrolytes fairly stable. Troponins are normal. CT scan abdomen pelvis without any acute abnormalities. CT chest without any acute abnormality, known mural thrombus. Respiratory virus panel, RSV and flu negative. Blood glucose adequate. Admitted due to altered mentation, possible aspiration pneumonia, hypoxemia.  Subjective:  Patient seen and examined.  She had episode of impulsiveness and agitation yesterday but today remains quiet and comfortable.  No other overnight events. At the time of this interview, patient was comfortable, without agitation.  Currently does not have necessity for bedside sitter.  Will watch very closely.  Patient has agreed to go to SNF.   Assessment & Plan:   Altered mental status in a patient with known cognitive decline, recently hospitalized. UTI with Citrobacter.   Currently no focal deficits. Respiratory  virus panel negative. Brain MRI with chronic encephalomalacia.  No acute stroke.   No evidence of CO2 narcosis. No evidence  of consolidation. Probably her baseline. Low-dose Xanax  as she takes it as a long-term for anxiety.  Patient was on 50 mg Seroquel  at home that was recently reduced.  Increased to her usual doses. Citrobacter,, completed antibiotics.  She was treated with additional levofloxacin . Remains full code. Leukocytosis improved. Keep on oxygen to keep saturation more than 90%. Procalcitonin less than 0.1.  B12 507.  A.m. cortisol 6.  TSH 0.5.  Acute hypoxemic respiratory failure: Likely secondary to on and off sleepiness.  There was no evidence of pneumonia or CO2 retention.  Keep on oxygen to keep saturation more than 90%.  Will probably need oxygen supplementation on discharge.  Agitation and confusion: Patient develops hospital-acquired delirium.  She likely has underlying cognitive decline and early dementia.  MRI showed encephalomalacia. Seroquel  and Xanax  as a long-term medications to continue. Delirium prevention.  Fall precautions. Patient may need one-to-one sitter, may need restraints, physical or chemical if gets agitated.      DVT prophylaxis: heparin  injection 5,000 Units Start: 01/02/24 1400   Code Status: Full code.   Family Communication: None available.  Patient's friend Mr. Aleene at the bedside 12/20. Disposition Plan: Status is: Inpatient Remains inpatient appropriate because: Unsafe discharge planning.  Medically stable to transfer to SNF.     Consultants:  Palliative care  Procedures:  None  Antimicrobials:  Levaquin  12/19--- 12/21     Objective: Vitals:   01/04/24 2052 01/04/24 2054 01/05/24 0500 01/05/24 0921  BP:      Pulse:      Resp:      Temp:      TempSrc:      SpO2: 92% 92%  93%  Weight:   61.6 kg   Height:        Intake/Output Summary (Last 24 hours) at 01/05/2024 1342 Last data filed at 01/05/2024 0828 Gross per 24 hour  Intake 240 ml  Output 750 ml  Net -510 ml   Filed Weights   01/02/24 1821 01/05/24 0500  Weight: 68 kg 61.6 kg     Examination:  General exam: Calm.  Slightly sleepy.  Wakes up and answers appropriately.  Forgetful.  Has some resting tremors. Respiratory system: Clear to auscultation. Respiratory effort normal.  No added sounds. Cardiovascular system: S1 & S2 heard, RRR.  No pedal edema. Gastrointestinal system: Soft.  Nontender.  Bowel sound present.   Data Reviewed: I have personally reviewed following labs and imaging studies  CBC: Recent Labs  Lab 12/31/23 0416 01/01/24 0504 01/01/24 2355 01/02/24 1802 01/04/24 0431  WBC 6.0 9.0 21.8* 24.2* 14.4*  NEUTROABS  --   --  20.6* 22.9* 11.4*  HGB 10.7* 11.7* 12.1 10.9* 11.4*  HCT 33.4* 36.3 39.1 34.7* 36.6  MCV 78.6* 77.7* 79.6* 78.9* 79.9*  PLT 334 369 429* 397 407*   Basic Metabolic Panel: Recent Labs  Lab 12/30/23 0452 12/31/23 0416 01/01/24 0504 01/01/24 2355  NA 143 142 139 138  K 3.2* 4.4 4.3 4.4  CL 110 111 106 104  CO2 27 23 23 23   GLUCOSE 100* 113* 141* 183*  BUN 8 9 9 15   CREATININE 0.49 0.53 0.66 0.61  CALCIUM  8.7* 9.4 10.0 9.9   GFR: Estimated Creatinine Clearance: 61.8 mL/min (by C-G formula based on SCr of 0.61 mg/dL). Liver Function Tests: No results for input(s): AST, ALT, ALKPHOS, BILITOT, PROT, ALBUMIN in the last 168 hours.  Recent Labs  Lab 01/01/24 2355  LIPASE 12   No results for input(s): AMMONIA in the last 168 hours. Coagulation Profile: No results for input(s): INR, PROTIME in the last 168 hours. Cardiac Enzymes: No results for input(s): CKTOTAL, CKMB, CKMBINDEX, TROPONINI in the last 168 hours.  BNP (last 3 results) Recent Labs    01/01/24 2355  PROBNP 80.6   HbA1C: No results for input(s): HGBA1C in the last 72 hours. CBG: Recent Labs  Lab 01/04/24 1126 01/04/24 1705 01/04/24 2136 01/05/24 0759 01/05/24 1227  GLUCAP 195* 203* 155* 160* 221*   Lipid Profile: No results for input(s): CHOL, HDL, LDLCALC, TRIG, CHOLHDL, LDLDIRECT in the  last 72 hours. Thyroid Function Tests: Recent Labs    01/02/24 1630  TSH 0.542   Anemia Panel: Recent Labs    01/02/24 1630  VITAMINB12 507  FOLATE 6.3   Sepsis Labs: Recent Labs  Lab 01/01/24 2359 01/02/24 0152 01/02/24 1630  PROCALCITON  --   --  <0.10  LATICACIDVEN 1.2 1.1  --     Recent Results (from the past 240 hours)  Urine Culture     Status: Abnormal   Collection Time: 12/28/23  7:41 PM   Specimen: Urine, Random  Result Value Ref Range Status   Specimen Description URINE, RANDOM  Final   Special Requests   Final    URINE, CLEAN CATCH Performed at [facility], [address] [place], [place] [address]    Culture >=100,000 COLONIES/mL CITROBACTER FREUNDII (A)  Final   Report Status 12/30/2023 FINAL  Final   Organism ID, Bacteria CITROBACTER FREUNDII (A)  Final      Susceptibility   Citrobacter freundii - MIC*    CEFEPIME <=0.12 SENSITIVE Sensitive     ERTAPENEM <=0.12 SENSITIVE  Sensitive     CEFTRIAXONE  8 RESISTANT Resistant     CIPROFLOXACIN <=0.06 SENSITIVE Sensitive     GENTAMICIN <=1 SENSITIVE Sensitive     NITROFURANTOIN <=16 SENSITIVE Sensitive     TRIMETH /SULFA  <=20 SENSITIVE Sensitive     PIP/TAZO Value in next row Sensitive      <=4 SENSITIVEThis is a modified FDA-approved test that has been validated and its performance characteristics determined by the reporting laboratory.  This laboratory is certified under the Clinical Laboratory Improvement Amendments CLIA as qualified to perform high complexity clinical laboratory testing.    MEROPENEM Value in next row Sensitive      <=4 SENSITIVEThis is a modified FDA-approved test that has been validated and its performance characteristics determined by the reporting laboratory.  This laboratory is certified under the Clinical Laboratory Improvement Amendments CLIA as qualified to perform high complexity clinical laboratory testing.    * >=100,000 COLONIES/mL CITROBACTER FREUNDII  Culture, blood  (routine x 2)     Status: None (Preliminary result)   Collection Time: 01/02/24 12:58 AM   Specimen: BLOOD  Result Value Ref Range Status   Specimen Description   Final    BLOOD BLOOD RIGHT ARM Performed at [facility], [address] [place], [place] [address]    Special Requests   Final    BOTTLES DRAWN AEROBIC AND ANAEROBIC Blood Culture results may not be optimal due to an inadequate volume of blood received in culture bottles Performed at [facility], [address] [place], [place] [address]    Culture   Final    NO GROWTH 3 DAYS Performed at [facility], [address], [place], [place] [address]    Report Status PENDING  Incomplete  Respiratory (~20 pathogens) panel by PCR     Status: None   Collection Time: 01/02/24  2:59 AM   Specimen: Nasopharyngeal Swab; Respiratory  Result Value Ref Range Status   Adenovirus NOT DETECTED NOT DETECTED Final   Coronavirus 229E NOT DETECTED NOT DETECTED Final    Comment: (NOTE) The Coronavirus on the Respiratory Panel, DOES NOT test for the novel  Coronavirus (2019 nCoV)    Coronavirus HKU1 NOT DETECTED NOT DETECTED Final   Coronavirus NL63 NOT DETECTED NOT DETECTED Final   Coronavirus OC43 NOT DETECTED NOT DETECTED Final   Metapneumovirus NOT DETECTED NOT DETECTED Final   Rhinovirus / Enterovirus NOT DETECTED NOT DETECTED Final   Influenza A NOT DETECTED NOT DETECTED Final   Influenza B NOT DETECTED NOT DETECTED Final   Parainfluenza Virus 1 NOT DETECTED NOT DETECTED Final   Parainfluenza Virus 2 NOT DETECTED NOT DETECTED Final   Parainfluenza Virus 3 NOT DETECTED NOT DETECTED Final   Parainfluenza Virus 4 NOT DETECTED NOT DETECTED Final   Respiratory Syncytial Virus NOT DETECTED NOT DETECTED Final   Bordetella pertussis NOT DETECTED NOT DETECTED Final   Bordetella Parapertussis NOT DETECTED NOT DETECTED Final   Chlamydophila pneumoniae NOT DETECTED NOT DETECTED Final   Mycoplasma  pneumoniae NOT DETECTED NOT DETECTED Final    Comment: Performed at [facility], [address] [place], [place] [address]  Culture, blood (routine x 2)     Status: None (Preliminary result)   Collection Time: 01/02/24  4:30 PM   Specimen: BLOOD RIGHT ARM  Result Value Ref Range Status   Specimen Description   Final    BLOOD RIGHT ARM Performed at [facility], [address] [place], [place] [address]  Special Requests   Final    BOTTLES DRAWN AEROBIC ONLY Blood Culture adequate volume Performed at [facility], [address] [place], [place] [address]    Culture   Final    NO GROWTH 3 DAYS Performed at [facility], [address] [place], [place] [address]    Report Status PENDING  Incomplete         Radiology Studies: No results found.       Scheduled Meds:  arformoterol   15 mcg Nebulization BID   aspirin  EC  81 mg Oral Daily   atorvastatin   40 mg Oral Daily   budesonide  (PULMICORT ) nebulizer solution  0.25 mg Nebulization BID   feeding supplement (GLUCERNA SHAKE)  237 mL Oral BID BM   heparin   5,000 Units Subcutaneous Q8H   insulin  aspart  0-6 Units Subcutaneous TID WC   insulin  glargine  10 Units Subcutaneous Daily   levofloxacin   250 mg Oral q1800   lisinopril   20 mg Oral Daily   QUEtiapine   50 mg Oral QHS   Continuous Infusions:     LOS: 3 days      Renato Applebaum, MD [facility]   "

## 2024-01-05 NOTE — TOC Progression Note (Addendum)
 Transition of Care [facility]) - Progression Note    Patient Details  Name: Allison Hughes MRN: [id number] Date of Birth: June 17, 1950  [facility] CM/SW Contact  Lorraine LILLETTE [place], [place] Phone Number: 01/05/2024, 12:51 PM  Clinical Narrative:    [facility] consult, MD concerned pt has cognitive decline, mentioned a brother but did not provide any information. MD interested in [facility].    CSW also received a secure chat from RN that friend Manus was in the room and would like to speak to ICM.  Florence Mungo, he stated he cares for pt and wanted to know when she would DC.  He added she was accepted to [facility] and that is preferred.  CSW stated pt would have to agree and choose facility, Manus asked her aloud if she wants to go to [facility].  He prompted her to yes or no, she made an affirmative sound.  Considering MD consult- CSW went to room to engage [place] and pt.  Pt was sitting in chair minimal conversation, a wave and nods. I asked about pt having a brother as MD could need to speak to NOK/legal relationship. Manus described them as being estranged and  that pt refers to him as the devil. He did share  name is Lamar Beryl Seltzer and he lives in [place], [place].    CSW [place], then called the brother at his # [phone],  spoke to him about having a sister. He stated yes, shared her name and that he knows pt is in hospital as [name] called him yesterday from her room- and she is discharging to a SNF. CSW sent MD [place] with info.  ICM following.    Expected Discharge Plan: Skilled Nursing Facility Barriers to Discharge: Continued Medical Work up               Expected Discharge Plan and Services   Discharge Planning Services: CM Consult   Living arrangements for the past 2 months: Single Family Home                 DME Arranged: N/A DME Agency: NA       HH Arranged: NA HH Agency: NA         Social Drivers of Health (SDOH) Interventions SDOH Screenings    Food Insecurity: No Food Insecurity (01/04/2024)  Housing: Low Risk (01/04/2024)  Transportation Needs: No Transportation Needs (01/04/2024)  Utilities: Not At Risk (01/04/2024)  Social Connections: Unknown (01/02/2024)  Tobacco Use: High Risk (01/02/2024)    Readmission Risk Interventions    01/04/2024    5:20 PM 10/24/2023    2:14 PM  Readmission Risk Prevention Plan  Transportation Screening Complete Complete  PCP or Specialist Appt within 3-5 Days  Complete  HRI or Home Care Consult  Complete  Social Work Consult for Recovery Care Planning/Counseling  Complete  Palliative Care Screening  Complete  Medication Review [facility]) Complete Referral to Pharmacy  PCP or Specialist appointment within 3-5 days of discharge Complete   HRI or Home Care Consult Complete   SW Recovery Care/Counseling Consult Complete   Palliative Care Screening Not Applicable   Skilled Nursing Facility Complete

## 2024-01-05 NOTE — Plan of Care (Incomplete)
" °  Problem: Education: Goal: Ability to describe self-care measures that may prevent or decrease complications (Diabetes Survival Skills Education) will improve Outcome: Progressing   Problem: Coping: Goal: Ability to adjust to condition or change in health will improve Outcome: Progressing   Problem: Fluid Volume: Goal: Ability to maintain a balanced intake and output will improve Outcome: Progressing   Problem: Health Behavior/Discharge Planning: Goal: Ability to identify and utilize available resources and services will improve Outcome: Progressing Goal: Ability to manage health-related needs will improve Outcome: Progressing   Problem: Metabolic: Goal: Ability to maintain appropriate glucose levels will improve Outcome: Progressing   Problem: Nutritional: Goal: Maintenance of adequate nutrition will improve Outcome: Progressing Goal: Progress toward achieving an optimal weight will improve Outcome: Progressing   Problem: Skin Integrity: Goal: Risk for impaired skin integrity will decrease Outcome: Progressing   Problem: Tissue Perfusion: Goal: Adequacy of tissue perfusion will improve Outcome: Progressing   Problem: Clinical Measurements: Goal: Ability to maintain clinical measurements within normal limits will improve Outcome: Progressing   Problem: Activity: Goal: Risk for activity intolerance will decrease Outcome: Progressing   Problem: Nutrition: Goal: Adequate nutrition will be maintained Outcome: Progressing   Problem: Education: Goal: Knowledge of General Education information will improve Description: Including pain rating scale, medication(s)/side effects and non-pharmacologic comfort measures Outcome: Adequate for Discharge   Problem: Health Behavior/Discharge Planning: Goal: Ability to manage health-related needs will improve Outcome: Adequate for Discharge   Problem: Clinical Measurements: Goal: Will remain free from infection Outcome:  Adequate for Discharge Goal: Diagnostic test results will improve Outcome: Adequate for Discharge Goal: Respiratory complications will improve Outcome: Adequate for Discharge Goal: Cardiovascular complication will be avoided Outcome: Adequate for Discharge   Problem: Coping: Goal: Level of anxiety will decrease Outcome: Adequate for Discharge   "

## 2024-01-06 DIAGNOSIS — E440 Moderate protein-calorie malnutrition: Secondary | ICD-10-CM | POA: Insufficient documentation

## 2024-01-06 LAB — GLUCOSE, CAPILLARY
Glucose-Capillary: 163 mg/dL — ABNORMAL HIGH (ref 70–99)
Glucose-Capillary: 281 mg/dL — ABNORMAL HIGH (ref 70–99)
Glucose-Capillary: 271 mg/dL — ABNORMAL HIGH (ref 70–99)
Glucose-Capillary: 221 mg/dL — ABNORMAL HIGH (ref 70–99)

## 2024-01-06 MED ORDER — INSULIN GLARGINE 100 UNIT/ML ~~LOC~~ SOLN
15.0000 [IU] | Freq: Every day | SUBCUTANEOUS | Status: DC
  Administered 2024-01-07 – 2024-01-09 (×3): 15 [IU] via SUBCUTANEOUS
  Filled 2024-01-06 (×3): qty 0.15

## 2024-01-06 MED ORDER — CARMEX CLASSIC LIP BALM EX OINT
TOPICAL_OINTMENT | CUTANEOUS | Status: DC | PRN
  Filled 2024-01-06: qty 10

## 2024-01-06 NOTE — Progress Notes (Signed)
 Nutrition Follow-up  DOCUMENTATION CODES:   Non-severe (moderate) malnutrition in context of chronic illness  INTERVENTION:  - DYS3 diet for ease of consumption. - Automatic meals trays given dementia.  - Patient can order her own meal if desired. - Continue Glucerna Shake po BID, each supplement provides 220 kcal and 10 grams of protein - Continue Magic cup BID with meals, each supplement provides 290 kcal and 9 grams of protein  - Encourage intake at all meals and of supplements. - Monitor weight trends.  NUTRITION DIAGNOSIS:   Moderate Malnutrition related to chronic illness as evidenced by mild fat depletion, moderate muscle depletion. *new  GOAL:   Patient will meet greater than or equal to 90% of their needs *ongoing  MONITOR:   PO intake, Supplement acceptance, Weight trends  REASON FOR ASSESSMENT:   Consult Assessment of nutrition requirement/status  ASSESSMENT:   Pt with PMH significant for: dementia, T2DM, HTN, HLD, stroke, COPD, HFpEF, GIB and recurrent falls. Admitted from 12/14-12/18 and, upon arriving to SNF was refused due to patient being hypoxic and with AMS. Original admission for AMS 2/2 Citrobacter UTI and now admitted for AMS possibly 2/2 aspiration PNA.  Patient in bedside chair at time of visit, friend at bedside.   UBW reported to be 150# and patient reports weight loss over the last several months as a result of eating less junk.  Per EMR, patient weighed as much as 181# in October and then weighed consistently at 150# (reported weights?) until this admission where she has been weighed at both 135# then 142#. Given such variance of weights accurate weight status and trends difficult to assess at this time.   Patient's friend reports the patient typically eats well at home. Usually consumes 2 meals a day, lunch and dinner, and has a good appetite at baseline.  The patient endorses eating well this admission. She is documented to be consuming an  average of 69% of meals over the past 3 days. She is also accepting Glucerna BID and reports liking Magic Cup as well. Encouraged patient to continue to eat well.    Medications reviewed and include: -  Labs reviewed:  No BMP since 12/18 HA1C 6.9 (as of 9/6) Blood Glucose 160-265 x24 hours  NUTRITION - FOCUSED PHYSICAL EXAM:  Flowsheet Row Most Recent Value  Orbital Region Mild depletion  Upper Arm Region Mild depletion  Thoracic and Lumbar Region Unable to assess  Buccal Region Mild depletion  Temple Region Moderate depletion  Clavicle Bone Region Severe depletion  Clavicle and Acromion Bone Region Moderate depletion  Scapular Bone Region Unable to assess  Dorsal Hand Mild depletion  Patellar Region Moderate depletion  Anterior Thigh Region Moderate depletion  Posterior Calf Region Moderate depletion  Edema (RD Assessment) None  Hair Reviewed  Eyes Reviewed  Mouth Reviewed  [dentures]  Skin Reviewed  Nails Reviewed    Diet Order:   Diet Order             DIET DYS 3 Room service appropriate? Yes; Fluid consistency: Thin  Diet effective now                   EDUCATION NEEDS:  Education needs have been addressed  Skin:  Skin Assessment: Reviewed RN Assessment  Last BM:  12/21  Height:  Ht Readings from Last 1 Encounters:  01/02/24 5' 7 (1.702 m)   Weight:  Wt Readings from Last 1 Encounters:  01/06/24 64.5 kg   Ideal Body Weight:  61.4 kg  BMI:  Body mass index is 22.27 kg/m.  Estimated Nutritional Needs:  Kcal:  1500-1700 kcals Protein:  70-85g Fluid:  >1.5L/day    Trude Ned RD, LDN Contact via Secure Chat.

## 2024-01-06 NOTE — Progress Notes (Signed)
 Physical Therapy Treatment Patient Details Name: Allison Hughes MRN: [id number] DOB: 1950-10-28 Today's Date: 01/06/2024   History of Present Illness Allison Hughes is a 73 y.o. female with medical history significant of  Dm type 2, HLD, HTN, CVA, COPD and HFpEF aortic mural thrombus, mild cognitive impairment, GI bleed and recurrent falls who presents to the ER with complaint of shortness of breath.    PT Comments  AxO x 1 pleasant Lady requiring repeat functional VC's to stay on task and exhibits slight delay in verbal response as well as word finding.    Assisted with amb in hallway.  General Gait Details: VERY unsteady gait with impaired spacial awareness and requiring assist to navigate walker correctly around obsticles and down hallway.  Trial amb without walker presents with even great instability, increased shuffle and near total loss of balance with poor self corrective reaction.  Also presents with increased tremors with increased distance s/s muscle fatigue.  Due to [name] impaired cognition, self awareness, she does not express symptoms unless prompted.  HIGH FALL RISK.  Prior Pt lived home alone.  Same house for over 50 years.  Amb IND NO AD.   Her good [name] cares for by doing house keeping, meal prepping, medication management, picks up her groceries and takes her to her MD appointments.    LPT has rec Pt will need ST Rehab at SNF to address mobility and functional decline prior to safely returning home.    If plan is discharge home, recommend the following: Assistance with cooking/housework;Help with stairs or ramp for entrance;Assist for transportation;A lot of help with walking and/or transfers;Supervision due to cognitive status;Two people to help with walking and/or transfers;Two people to help with bathing/dressing/bathroom   Can travel by private vehicle     No  Equipment Recommendations  None recommended by PT    Recommendations for Other Services        Precautions / Restrictions Precautions Precautions: Fall Precaution/Restrictions Comments: dementia Restrictions Weight Bearing Restrictions Per Provider Order: No     Mobility  Bed Mobility               General bed mobility comments: OOB in recliner    Transfers Overall transfer level: Needs assistance Equipment used: Rolling walker (2 wheels), None Transfers: Sit to/from Stand, Bed to chair/wheelchair/BSC Sit to Stand: Min assist, Contact guard assist Stand pivot transfers: Min assist, Mod assist         General transfer comment: Any where from min to mod assist with MAX VC's on safety with turn completion.  Max VC's on proper walker use.    Ambulation/Gait Ambulation/Gait assistance: Contact guard assist, Min assist Gait Distance (Feet): 62 Feet Assistive device: Rolling walker (2 wheels), None Gait Pattern/deviations: Drifts right/left, Step-through pattern, Knees buckling, Decreased weight shift to left, Decreased stride length, Decreased dorsiflexion - right Gait velocity: decreased     General Gait Details: VERY unsteady gait with impaired spacial awareness and requiring assist to navigate walker correctly around obsticles and down hallway.  Trial amb without walker presents with even great instability, increased shuffle and near total loss of balance with poor self corrective reaction.  Also presents with increased tremors with increased distance s/s muscle fatigue.  Due to [name] impaired cognition, self awareness, she does not express symptoms unless prompted.  HIGH FALL RISK.   Stairs             Wheelchair Mobility     Tilt Bed  Modified Rankin (Stroke Patients Only)       Balance                                            Communication Communication Communication: Impaired Factors Affecting Communication: Hearing impaired  Cognition Arousal: Alert Behavior During Therapy: WFL for tasks assessed/performed   PT -  Cognitive impairments: Safety/Judgement, Problem solving, Attention, Awareness                       PT - Cognition Comments: AxO x 1 pleasant Lady requiring repeat functional VC's to stay on task and exhibits slight delay in verbal responce as well as word finding. Following commands: Impaired Following commands impaired: Follows one step commands inconsistently    Cueing Cueing Techniques: Verbal cues, Gestural cues  Exercises      General Comments        Pertinent Vitals/Pain Pain Assessment Pain Assessment: No/denies pain    Home Living                          Prior Function            PT Goals (current goals can now be found in the care plan section) Progress towards PT goals: Progressing toward goals    Frequency    Min 2X/week      PT Plan      Co-evaluation              AM-PAC PT 6 Clicks Mobility   Outcome Measure  Help needed turning from your back to your side while in a flat bed without using bedrails?: A Little Help needed moving from lying on your back to sitting on the side of a flat bed without using bedrails?: A Little Help needed moving to and from a bed to a chair (including a wheelchair)?: A Little Help needed standing up from a chair using your arms (e.g., wheelchair or bedside chair)?: A Little Help needed to walk in hospital room?: A Lot Help needed climbing 3-5 steps with a railing? : A Lot 6 Click Score: 16    End of Session Equipment Utilized During Treatment: Gait belt Activity Tolerance: Patient limited by fatigue Patient left: in chair;with call bell/phone within reach;with chair alarm set Nurse Communication: Mobility status PT Visit Diagnosis: Unsteadiness on feet (R26.81);Other abnormalities of gait and mobility (R26.89);Repeated falls (R29.6);Muscle weakness (generalized) (M62.81)     Time: 8652-8595 PT Time Calculation (min) (ACUTE ONLY): 17 min  Charges:    $Gait Training: 8-22 mins PT  General Charges $$ ACUTE PT VISIT: 1 Visit                     Katheryn Leap  PTA Acute  Rehabilitation Services Office M-F          [phone]

## 2024-01-06 NOTE — Progress Notes (Signed)
 "                                                                                                                                                                                                          Daily Progress Note   Patient Name: Allison Hughes       Date: 01/06/2024 DOB: 1950-07-11  Age: 73 y.o. MRN#: [id number] Attending Physician: Raenelle Coria, MD Primary Care Physician: Valma Carwin, MD Admit Date: 01/01/2024  Reason for Consultation/Follow-up: Establishing goals of care  Subjective: Awake alert, sitting in chair, friend Allison Hughes at bedside. SLP has also evaluated the patient.   Length of Stay: 4  Current Medications: Scheduled Meds:   arformoterol   15 mcg Nebulization BID   aspirin  EC  81 mg Oral Daily   atorvastatin   40 mg Oral Daily   budesonide  (PULMICORT ) nebulizer solution  0.25 mg Nebulization BID   feeding supplement (GLUCERNA SHAKE)  237 mL Oral BID BM   heparin   5,000 Units Subcutaneous Q8H   insulin  aspart  0-6 Units Subcutaneous TID WC   insulin  glargine  10 Units Subcutaneous Daily   levofloxacin   250 mg Oral q1800   lisinopril   20 mg Oral Daily   pantoprazole   40 mg Oral Daily   QUEtiapine   50 mg Oral QHS    Continuous Infusions:   PRN Meds: acetaminophen , acetaminophen , ALPRAZolam , ipratropium-albuterol , lip balm, ondansetron  (ZOFRAN ) IV  Physical Exam         Elderly lady resting in bed  More alert and responsive today Has some cognitive delay Appears with generalized weakness  Vital Signs: BP (!) 106/56 (BP Location: Right Arm)   Pulse 83   Temp 97.6 F (36.4 C) (Oral)   Resp 16   Ht 5' 7 (1.702 m)   Wt 64.5 kg   SpO2 96%   BMI 22.27 kg/m  SpO2: SpO2: 96 % O2 Device: O2 Device: Room Air O2 Flow Rate: O2 Flow Rate (L/min): 2 L/min  Intake/output summary:  Intake/Output Summary (Last 24 hours) at 01/06/2024 1131 Last data filed at 01/06/2024 0849 Gross per 24 hour  Intake 420 ml  Output 200 ml  Net 220 ml   LBM: Last BM  Date : 01/04/24 Baseline Weight: Weight: 68 kg Most recent weight: Weight: 64.5 kg       Palliative Assessment/Data:      Patient Active Problem List   Diagnosis Date Noted   Palliative care encounter 01/03/2024   Goals of care, counseling/discussion 01/03/2024   Counseling and coordination of care 01/03/2024   Acute  respiratory failure with hypoxia (HCC) 01/02/2024   Acute cystitis with hematuria 12/29/2023   Generalized weakness 12/29/2023   Failure to thrive in adult 12/29/2023   Recurrent falls 12/29/2023   Chronic obstructive pulmonary disease (HCC) 12/29/2023   Acute metabolic encephalopathy 12/28/2023   Acute encephalopathy 11/29/2023   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   GERD (gastroesophageal reflux disease) 11/29/2023   Chronic heart failure with preserved ejection fraction (HFpEF) (HCC) 10/20/2023   Fall at home, initial encounter 10/20/2023   Hyperlipidemia 10/20/2023   History of gastric ulcer 10/20/2023   ABLA (acute blood loss anemia) 09/19/2023   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   Neurocognitive disorder 08/31/2023   Impaired mobility and ADLs 08/20/2023   Anemia 09/02/2022   Aphasia 06/03/2022   History of stroke 09/06/2020   DM (diabetes mellitus), type 2 (HCC) 11/20/2018   Hypokalemia 02/02/2016   Anxiety 02/02/2016   Essential hypertension 02/02/2016    Palliative Care Assessment & Plan   Patient Profile:    Assessment:  Patient is a 73 year old female with a past medical history of dementia, type 2 diabetes mellitus, hypertension, hyperlipidemia, prior CVA, COPD, HFpEF, aortic mural thrombus, history of GI bleed, and recurrent falls who was admitted on 01/01/2024 with hypoxia and altered mental status.  Patient had just been discharged from being admitted on 12/14-12/18/25 for management of altered mental status secondary to Citrobacter UTI and when patient presented to SNF in the evening of 01/01/2024, they did not take her  admission due to hypoxia and altered mental status.  Since hospitalization, patient receiving management for acute hypoxic respiratory failure and altered mental status.  Palliative medicine team consulted to assist with complex medical decision making.   Recommendations/Plan: Palliative care continues to follow for CODE STATUS and broad goals of care discussions.  Discussed with Allison Hughes patient's friend and the patient, she is resting in chair.  Patient is awake, reasonably alert and speaks slowly but is able to make her wishes known. I discussed with her about her wishes for her HCPOA, she states that she doesn't want her brother to be her decision maker or be informed about her current condition and hospitalization.    Full Code.  SNF rehab with palliative on discharge.   Goals of Care and Additional Recommendations: Limitations on Scope of Treatment: Full Scope Treatment  Code Status:    Code Status Orders  (From admission, onward)           Start     Ordered   01/02/24 1840  Full code  (Code Status)  Continuous       Question:  By:  Answer:  Consent: discussion documented in EHR   01/02/24 1839           Code Status History     Date Active Date Inactive Code Status Order ID Comments User Context   12/28/2023 2201 01/01/2024 2307 Full Code [id number]  Lou Claretta HERO, MD ED   11/29/2023 1824 12/04/2023 1911 Full Code [id number]  Claudene Maximino LABOR, MD ED   10/20/2023 1616 11/11/2023 1825 Full Code [id number]  Claudene Maximino LABOR, MD Inpatient   09/19/2023 2320 09/24/2023 2131 Full Code [id number]  Dorinda Drue DASEN, MD ED   09/02/2022 1237 09/03/2022 1532 Full Code [id number]  Seena Marsa NOVAK, MD ED   06/03/2022 2225 06/07/2022 1827 Full Code [id number]  Shona Terry SAILOR, DO ED   09/06/2020 1647 09/08/2020 2226 Full Code [id number]  Lanetta Lingo, MD ED   02/02/2016 0134 02/05/2016 1734 Full  Code [id number]  Jonel Lonni SQUIBB, MD Inpatient   05/11/2011 1937 05/12/2011 2105 Full  Code [id number]  Rojelio Montie BIRCH, RN Inpatient       Prognosis:  Unable to determine  Discharge Planning: Skilled Nursing Facility for rehab with Palliative care service follow-up  Care plan was discussed with patient, friend Allison   Thank you for allowing the Palliative Medicine Team to assist in the care of this patient. High MDM.      Greater than 50%  of this time was spent counseling and coordinating care related to the above assessment and plan.  Lonia Serve, MD  Please contact Palliative Medicine Team phone at [phone] for questions and concerns.       "

## 2024-01-06 NOTE — Progress Notes (Signed)
 Speech Language Pathology Treatment: Dysphagia  Patient Details Name: Allison Hughes MRN: [id number] DOB: 1950-07-31 Today's Date: 01/06/2024 Time: 8976-8963 SLP Time Calculation (min) (ACUTE ONLY): 13 min  Assessment / Plan / Recommendation Clinical Impression  Rec: continue on current diet, SLP will follow.   Patient seen by SLP for skilled treatment focused on dysphagia goals. RN indicated she swallowed medications and breakfast without any observed difficulty. Patient denies any difficulty as well. SLP assessed her swallow as she took consecutive sips of thin liquids (water) via cup sips. Swallow initiation appeared delayed but no overt s/s aspiration observed.     HPI HPI: Allison Hughes is a 73 y.o. female with medical history significant of  Dm type 2, HLD, HTN, CVA, COPD and HFpEF aortic mural thrombus, mild cognitive impairment, GI bleed and recurrent falls who presents to the ER with complaint of shortness of breath. SLP consulted for bedside swallow assessment.      SLP Plan  Continue with current plan of care        Swallow Evaluation Recommendations         Recommendations  Diet recommendations: Dysphagia 3 (mechanical soft);Thin liquid Liquids provided via: Cup Medication Administration: Other (Comment) (as tolerated) Supervision: Patient able to self feed Compensations: Slow rate;Small sips/bites Postural Changes and/or Swallow Maneuvers: Seated upright 90 degrees                  Oral care BID   Intermittent Supervision/Assistance Dysphagia, oral phase (R13.11)     Continue with current plan of care    Norleen IVAR Blase, MA, CCC-SLP Speech Therapy   01/06/2024, 2:01 PM

## 2024-01-06 NOTE — Progress Notes (Signed)
 " PROGRESS NOTE    Allison Hughes  FMW:[id number] DOB: [birth year] DOA: 01/01/2024 PCP: Valma Carwin, MD    Brief Narrative:  73 year old with dementia, type 2 diabetes, hypertension, hyperlipidemia, previous stroke, COPD, heart failure with preserved ejection fraction, aortic mural thrombus, history of GI bleed and recurrent fall who was admitted to the hospital 12/14-12/18/25 with altered mental status apparently secondary to Citrobacter UTI.  She was thought to be encephalopathic from Xanax , Seroquel  also.  Patient was discharged to SNF later in the night, they did not take her admission because she was hypoxic and altered.  She was sent back to the hospital, brought to [facility].   On arrival to the emergency room, patient was arousable but unable to give any history.  Blood pressure stable.  Oxygen stable on 2 L oxygen supplementation.  WBC count 21.8 with normal WBC count in the morning before discharge. Blood gas and electrolytes fairly stable. Troponins are normal. CT scan abdomen pelvis without any acute abnormalities. CT chest without any acute abnormality, known mural thrombus. Respiratory virus panel, RSV and flu negative. Blood glucose adequate. Admitted due to altered mentation, possible aspiration pneumonia, hypoxemia.  Subjective:  Patient seen and examined sitting in the chair.  Philip at the bedside.  Patient looks very comfortable and interactive today.  On room air.   Assessment & Plan:   Altered mental status in a patient with known cognitive decline, recently hospitalized. UTI with Citrobacter.   Currently no focal deficits.  Mental status fluctuates however today she is improved. Respiratory  virus panel negative. Brain MRI with chronic encephalomalacia.  No acute stroke.   No evidence of CO2 narcosis. No evidence of consolidation. Probably her baseline. Low-dose Xanax  as she takes it as a long-term for anxiety.  Patient was on 50 mg Seroquel  at home that  was recently reduced.  Increased to her usual doses. Citrobacter,, completed antibiotics.  She was treated with additional levofloxacin .  Last dose today. Remains full code. Leukocytosis improved. Keep on oxygen to keep saturation more than 90%. Procalcitonin less than 0.1.  B12 507.  A.m. cortisol 6.  TSH 0.5.  Acute hypoxemic respiratory failure: Likely secondary to on and off sleepiness.  There was no evidence of pneumonia or CO2 retention.  Keep on oxygen to keep saturation more than 90%.  Will probably need oxygen supplementation on discharge.  Agitation and confusion: Patient developed hospital-acquired delirium.  She likely has underlying cognitive decline and early dementia.  MRI showed encephalomalacia. Seroquel  and Xanax  as a long-term medications to continue. Delirium prevention.  Fall precautions.  Medically stable to transition to a SNF when bed available.      DVT prophylaxis: heparin  injection 5,000 Units Start: 01/02/24 1400   Code Status: Full code.   Family Communication: Patient's friend Aleene at the bedside.  Patient has asked not to connect to her brother. Disposition Plan: Status is: Inpatient Remains inpatient appropriate because: Waiting for bed.  Medically stable to transfer to SNF.     Consultants:  Palliative care  Procedures:  None  Antimicrobials:  Levaquin  12/19---      Objective: Vitals:   01/06/24 0608 01/06/24 0850 01/06/24 1010 01/06/24 1308  BP: 116/66  (!) 106/56 (!) 115/55  Pulse: 83   87  Resp:    16  Temp: 97.6 F (36.4 C)   97.7 F (36.5 C)  TempSrc: Oral   Oral  SpO2: 98% 96%  92%  Weight:      Height:  Intake/Output Summary (Last 24 hours) at 01/06/2024 1403 Last data filed at 01/06/2024 0849 Gross per 24 hour  Intake 180 ml  Output --  Net 180 ml   Filed Weights   01/02/24 1821 01/05/24 0500 01/06/24 0500  Weight: 68 kg 61.6 kg 64.5 kg    Examination:  General exam: Calm and comfortable.  Answering  appropriately with some cognitive delay.  On room air today. Respiratory system: Clear to auscultation. Respiratory effort normal.  No added sounds. Cardiovascular system: S1 & S2 heard, RRR.  No pedal edema. Gastrointestinal system: Soft.  Nontender.  Bowel sound present.   Data Reviewed: I have personally reviewed following labs and imaging studies  CBC: Recent Labs  Lab 12/31/23 0416 01/01/24 0504 01/01/24 2355 01/02/24 1802 01/04/24 0431  WBC 6.0 9.0 21.8* 24.2* 14.4*  NEUTROABS  --   --  20.6* 22.9* 11.4*  HGB 10.7* 11.7* 12.1 10.9* 11.4*  HCT 33.4* 36.3 39.1 34.7* 36.6  MCV 78.6* 77.7* 79.6* 78.9* 79.9*  PLT 334 369 429* 397 407*   Basic Metabolic Panel: Recent Labs  Lab 12/31/23 0416 01/01/24 0504 01/01/24 2355  NA 142 139 138  K 4.4 4.3 4.4  CL 111 106 104  CO2 23 23 23   GLUCOSE 113* 141* 183*  BUN 9 9 15   CREATININE 0.53 0.66 0.61  CALCIUM  9.4 10.0 9.9   GFR: Estimated Creatinine Clearance: 61.8 mL/min (by C-G formula based on SCr of 0.61 mg/dL). Liver Function Tests: No results for input(s): AST, ALT, ALKPHOS, BILITOT, PROT, ALBUMIN in the last 168 hours.  Recent Labs  Lab 01/01/24 2355  LIPASE 12   No results for input(s): AMMONIA in the last 168 hours. Coagulation Profile: No results for input(s): INR, PROTIME in the last 168 hours. Cardiac Enzymes: No results for input(s): CKTOTAL, CKMB, CKMBINDEX, TROPONINI in the last 168 hours.  BNP (last 3 results) Recent Labs    01/01/24 2355  PROBNP 80.6   HbA1C: No results for input(s): HGBA1C in the last 72 hours. CBG: Recent Labs  Lab 01/05/24 1227 01/05/24 1613 01/05/24 2108 01/06/24 0738 01/06/24 1114  GLUCAP 221* 216* 265* 163* 281*   Lipid Profile: No results for input(s): CHOL, HDL, LDLCALC, TRIG, CHOLHDL, LDLDIRECT in the last 72 hours. Thyroid Function Tests: No results for input(s): TSH, T4TOTAL, FREET4, T3FREE, THYROIDAB in the  last 72 hours.  Anemia Panel: No results for input(s): VITAMINB12, FOLATE, FERRITIN, TIBC, IRON , RETICCTPCT in the last 72 hours.  Sepsis Labs: Recent Labs  Lab 01/01/24 2359 01/02/24 0152 01/02/24 1630  PROCALCITON  --   --  <0.10  LATICACIDVEN 1.2 1.1  --     Recent Results (from the past 240 hours)  Urine Culture     Status: Abnormal   Collection Time: 12/28/23  7:41 PM   Specimen: Urine, Random  Result Value Ref Range Status   Specimen Description URINE, RANDOM  Final   Special Requests   Final    URINE, CLEAN CATCH Performed at [facility], [address] [place], [place] [address]    Culture >=100,000 COLONIES/mL CITROBACTER FREUNDII (A)  Final   Report Status 12/30/2023 FINAL  Final   Organism ID, Bacteria CITROBACTER FREUNDII (A)  Final      Susceptibility   Citrobacter freundii - MIC*    CEFEPIME <=0.12 SENSITIVE Sensitive     ERTAPENEM <=0.12 SENSITIVE Sensitive     CEFTRIAXONE  8 RESISTANT Resistant     CIPROFLOXACIN <=0.06 SENSITIVE Sensitive  GENTAMICIN <=1 SENSITIVE Sensitive     NITROFURANTOIN <=16 SENSITIVE Sensitive     TRIMETH /SULFA  <=20 SENSITIVE Sensitive     PIP/TAZO Value in next row Sensitive      <=4 SENSITIVEThis is a modified FDA-approved test that has been validated and its performance characteristics determined by the reporting laboratory.  This laboratory is certified under the Clinical Laboratory Improvement Amendments CLIA as qualified to perform high complexity clinical laboratory testing.    MEROPENEM Value in next row Sensitive      <=4 SENSITIVEThis is a modified FDA-approved test that has been validated and its performance characteristics determined by the reporting laboratory.  This laboratory is certified under the Clinical Laboratory Improvement Amendments CLIA as qualified to perform high complexity clinical laboratory testing.    * >=100,000 COLONIES/mL CITROBACTER FREUNDII  Culture, blood (routine x 2)      Status: None (Preliminary result)   Collection Time: 01/02/24 12:58 AM   Specimen: BLOOD  Result Value Ref Range Status   Specimen Description   Final    BLOOD BLOOD RIGHT ARM Performed at [facility], [address], [place], [place] [address]    Special Requests   Final    BOTTLES DRAWN AEROBIC AND ANAEROBIC Blood Culture results may not be optimal due to an inadequate volume of blood received in culture bottles Performed at [facility], [address] [place], [place] [address]    Culture   Final    NO GROWTH 4 DAYS Performed at [facility], [address] [place], [place] [address]    Report Status PENDING  Incomplete  Respiratory (~20 pathogens) panel by PCR     Status: None   Collection Time: 01/02/24  2:59 AM   Specimen: Nasopharyngeal Swab; Respiratory  Result Value Ref Range Status   Adenovirus NOT DETECTED NOT DETECTED Final   Coronavirus 229E NOT DETECTED NOT DETECTED Final    Comment: (NOTE) The Coronavirus on the Respiratory Panel, DOES NOT test for the novel  Coronavirus (2019 nCoV)    Coronavirus HKU1 NOT DETECTED NOT DETECTED Final   Coronavirus NL63 NOT DETECTED NOT DETECTED Final   Coronavirus OC43 NOT DETECTED NOT DETECTED Final   Metapneumovirus NOT DETECTED NOT DETECTED Final   Rhinovirus / Enterovirus NOT DETECTED NOT DETECTED Final   Influenza A NOT DETECTED NOT DETECTED Final   Influenza B NOT DETECTED NOT DETECTED Final   Parainfluenza Virus 1 NOT DETECTED NOT DETECTED Final   Parainfluenza Virus 2 NOT DETECTED NOT DETECTED Final   Parainfluenza Virus 3 NOT DETECTED NOT DETECTED Final   Parainfluenza Virus 4 NOT DETECTED NOT DETECTED Final   Respiratory Syncytial Virus NOT DETECTED NOT DETECTED Final   Bordetella pertussis NOT DETECTED NOT DETECTED Final   Bordetella Parapertussis NOT DETECTED NOT DETECTED Final   Chlamydophila pneumoniae NOT DETECTED NOT DETECTED Final   Mycoplasma pneumoniae NOT  DETECTED NOT DETECTED Final    Comment: Performed at [facility], [address], [place], [place] [address]  Culture, blood (routine x 2)     Status: None (Preliminary result)   Collection Time: 01/02/24  4:30 PM   Specimen: BLOOD RIGHT ARM  Result Value Ref Range Status   Specimen Description   Final    BLOOD RIGHT ARM Performed at [facility], [address], [place], [place] [address]    Special Requests   Final    BOTTLES DRAWN AEROBIC ONLY Blood Culture adequate volume Performed at  [facility] [address] [place], [place] [address]    Culture   Final    NO GROWTH 4 DAYS Performed at [facility], [address], [place], [place] [address]    Report Status PENDING  Incomplete         Radiology Studies: No results found.       Scheduled Meds:  arformoterol   15 mcg Nebulization BID   aspirin  EC  81 mg Oral Daily   atorvastatin   40 mg Oral Daily   budesonide  (PULMICORT ) nebulizer solution  0.25 mg Nebulization BID   feeding supplement (GLUCERNA SHAKE)  237 mL Oral BID BM   heparin   5,000 Units Subcutaneous Q8H   insulin  aspart  0-6 Units Subcutaneous TID WC   [START ON 01/07/2024] insulin  glargine  15 Units Subcutaneous Daily   levofloxacin   250 mg Oral q1800   lisinopril   20 mg Oral Daily   pantoprazole   40 mg Oral Daily   QUEtiapine   50 mg Oral QHS   Continuous Infusions:     LOS: 4 days      Renato Applebaum, MD [facility]   "

## 2024-01-07 ENCOUNTER — Inpatient Hospital Stay (HOSPITAL_COMMUNITY)

## 2024-01-07 DIAGNOSIS — I10 Essential (primary) hypertension: Secondary | ICD-10-CM

## 2024-01-07 DIAGNOSIS — E1169 Type 2 diabetes mellitus with other specified complication: Secondary | ICD-10-CM

## 2024-01-07 DIAGNOSIS — G9340 Encephalopathy, unspecified: Secondary | ICD-10-CM

## 2024-01-07 DIAGNOSIS — E440 Moderate protein-calorie malnutrition: Secondary | ICD-10-CM

## 2024-01-07 DIAGNOSIS — Z794 Long term (current) use of insulin: Secondary | ICD-10-CM

## 2024-01-07 LAB — COMPREHENSIVE METABOLIC PANEL WITH GFR
ALT: 32 U/L (ref 0–44)
AST: 18 U/L (ref 15–41)
Albumin: 3.6 g/dL (ref 3.5–5.0)
Alkaline Phosphatase: 168 U/L — ABNORMAL HIGH (ref 38–126)
Anion gap: 11 (ref 5–15)
BUN: 23 mg/dL (ref 8–23)
CO2: 25 mmol/L (ref 22–32)
Calcium: 10.2 mg/dL (ref 8.9–10.3)
Chloride: 101 mmol/L (ref 98–111)
Creatinine, Ser: 0.6 mg/dL (ref 0.44–1.00)
GFR, Estimated: >60 mL/min
Glucose, Bld: 275 mg/dL — ABNORMAL HIGH (ref 70–99)
Potassium: 4.3 mmol/L (ref 3.5–5.1)
Sodium: 137 mmol/L (ref 135–145)
Total Bilirubin: 0.2 mg/dL (ref 0.0–1.2)
Total Protein: 7.1 g/dL (ref 6.5–8.1)

## 2024-01-07 LAB — PHOSPHORUS: Phosphorus: 3.2 mg/dL (ref 2.5–4.6)

## 2024-01-07 LAB — CULTURE, BLOOD (ROUTINE X 2)
Culture: NO GROWTH
Culture: NO GROWTH
Special Requests: ADEQUATE

## 2024-01-07 LAB — CBC WITH DIFFERENTIAL/PLATELET
Abs Immature Granulocytes: 0.17 K/uL — ABNORMAL HIGH (ref 0.00–0.07)
Basophils Absolute: 0.1 K/uL (ref 0.0–0.1)
Basophils Relative: 1 %
Eosinophils Absolute: 0.1 K/uL (ref 0.0–0.5)
Eosinophils Relative: 1 %
HCT: 38.9 % (ref 36.0–46.0)
Hemoglobin: 12.1 g/dL (ref 12.0–15.0)
Immature Granulocytes: 2 %
Lymphocytes Relative: 14 %
Lymphs Abs: 1.5 K/uL (ref 0.7–4.0)
MCH: 24.7 pg — ABNORMAL LOW (ref 26.0–34.0)
MCHC: 31.1 g/dL (ref 30.0–36.0)
MCV: 79.6 fL — ABNORMAL LOW (ref 80.0–100.0)
Monocytes Absolute: 0.5 K/uL (ref 0.1–1.0)
Monocytes Relative: 4 %
Neutro Abs: 8.9 K/uL — ABNORMAL HIGH (ref 1.7–7.7)
Neutrophils Relative %: 78 %
Platelets: 477 K/uL — ABNORMAL HIGH (ref 150–400)
RBC: 4.89 MIL/uL (ref 3.87–5.11)
RDW: 14.9 % (ref 11.5–15.5)
WBC: 11.3 K/uL — ABNORMAL HIGH (ref 4.0–10.5)
nRBC: 0 % (ref 0.0–0.2)

## 2024-01-07 LAB — GLUCOSE, CAPILLARY
Glucose-Capillary: 197 mg/dL — ABNORMAL HIGH (ref 70–99)
Glucose-Capillary: 271 mg/dL — ABNORMAL HIGH (ref 70–99)
Glucose-Capillary: 257 mg/dL — ABNORMAL HIGH (ref 70–99)
Glucose-Capillary: 171 mg/dL — ABNORMAL HIGH (ref 70–99)

## 2024-01-07 LAB — MAGNESIUM: Magnesium: 1.8 mg/dL (ref 1.7–2.4)

## 2024-01-07 MED ORDER — LEVOFLOXACIN 500 MG PO TABS: 250.0000 mg | ORAL_TABLET | Freq: Once | ORAL | Status: DC

## 2024-01-07 MED ORDER — LEVOFLOXACIN 500 MG PO TABS
250.0000 mg | ORAL_TABLET | Freq: Every day | ORAL | Status: AC
  Administered 2024-01-07 – 2024-01-08 (×2): 250 mg via ORAL
  Filled 2024-01-07 (×2): qty 1

## 2024-01-07 NOTE — Progress Notes (Signed)
 " PROGRESS NOTE    Allison Hughes  FMW:[id number] DOB: Sep 24, 1950 DOA: 01/01/2024 PCP: Valma Carwin, MD   Brief Narrative:  The patient is a 73 year old with dementia, type 2 diabetes, hypertension, hyperlipidemia, previous stroke, COPD, heart failure with preserved ejection fraction, aortic mural thrombus, history of GI bleed and recurrent fall who was admitted to the hospital 12/14-12/18/25 with altered mental status apparently secondary to Citrobacter UTI.  She was thought to be encephalopathic from Xanax , Seroquel  also.  Patient was discharged to SNF later in the night, they did not take her admission because she was hypoxic and altered.  She was sent back to the hospital, brought to [facility].   On arrival to the emergency room, patient was arousable but unable to give any history.  Blood pressure stable.  Oxygen stable on 2 L oxygen supplementation.  WBC count 21.8 with normal WBC count in the morning before discharge. Further workup was done and Blood gas and electrolytes were fairly stable, troponins are normal, CT scan abdomen pelvis without any acute abnormalities and CT chest without any acute abnormality, known mural thrombus. Respiratory virus panel, RSV and flu negative and Blood glucose adequate. Subsequently was Admitted due to altered mentation, possible aspiration pneumonia, hypoxemia. She's improving and PT/OT recommending SNF and cannot be accepted until Friday 12/26.   Assessment and Plan:  Acute Encephalopathy in a patient with known cognitive decline, recently hospitalized with Citrobacter Freundii UTI  -Currently no focal deficits.  Mental status fluctuates however today she is improved. -Respiratory  virus panel negative. -Brain MRI with chronic encephalomalacia.  No acute stroke.   -No evidence of CO2 narcosis but CXR did show Bibasilar streaky airspace opacities, similar to prior and possibly representing atelectasis and/or infection this AM Probably her  baseline. -For her Citrobacter she completed antibiotics and  she was treated with additional Levofloxacin  with the last dose  to be 12/25 (will have gotten 7 days) Remains full code. Leukocytosis is improving:  Recent Labs  Lab 12/30/23 0452 12/31/23 0416 01/01/24 0504 01/01/24 2355 01/02/24 1802 01/04/24 0431 01/07/24 1056  WBC 6.4 6.0 9.0 21.8* 24.2* 14.4* 11.3*  -Keep on oxygen to keep saturation more than 90%. -Procalcitonin less than 0.1.  B12 507.  A.m. cortisol 6.  TSH 0.5.   Acute Hypoxemic Respiratory Failure: Likely secondary to on and off sleepiness.  There was no evidence of pneumonia or CO2 retention.  Keep on oxygen to keep saturation more than 90%.  Ambulatory Home O2 Screen Done and does not require O2. Repeat CXR as above. Abx as above. C/w DuoNeb 3 mL Neb q4hprn Wheezing and SOB, Budesonide  0.25 mg Neb BID and Arfomoterol 15 mcg Neb BID. C/w D3 Diet w/ Thin Liquids    Agitation and Confusion in the setting of Delirium superimposed on early Dementia: Patient developed hospital-acquired delirium.  She likely has underlying cognitive decline and early dementia.  MRI showed encephalomalacia. -C/w Quetiapine  50 mg po at bedtime and Alprazolam  0.25 mg po BIDprn Anxiety as long-term medications to continue. -C/w Delirium Precautions and prevention and Fall Precautions.  Essential HTN: C/w Lisinopril  20 mg po Daily. CTM BP per Protocol. Last BP reading was 137/73   Diabetes Mellitus Type 2: C/w Very Sensitive Novolog  0-6 units AC and Insulin  Glargine 15 units sq Daily. CTM CBGs per Protocol. CBG Trend:  Recent Labs  Lab 01/06/24 0738 01/06/24 1114 01/06/24 1618 01/06/24 2101 01/07/24 0715 01/07/24 1106 01/07/24 1600  GLUCAP 163* 281* 271* 221* 197* 271* 257*  Hx of CVA: C/w ASA 81 mg po Daily and Atorvastatin  40 mg po Daily   HLD: C/w Atorvastatin  40 mg po Daily  Thrombocytosis: Likely Reactive in the setting of above. Plt Count Trend:  Recent Labs  Lab  12/30/23 0452 12/31/23 0416 01/01/24 0504 01/01/24 2355 01/02/24 1802 01/04/24 0431 01/07/24 1056  PLT 360 334 369 429* 397 407* 477*  -CTM and Trend and repeat CBC in the AM  Moderate Malnutrition in the Context of Chronic Illness: Nutrition Status: Nutrition Problem: Moderate Malnutrition Etiology: chronic illness Signs/Symptoms: mild fat depletion, moderate muscle depletion Interventions: Refer to RD note for recommendations, Magic cup, Glucerna shake  GERD/GI Prophylaxis: C/w Pantoprazole  40 mg po daily     DVT prophylaxis: heparin  injection 5,000 Units Start: 01/02/24 1400    Code Status: Full Code Family Communication: No family present @ bedside   Disposition Plan:  Level of care: Progressive Status is: Inpatient Remains inpatient appropriate because: Appears improved and Medically stable to D/C to SNF but cannot be accepted until 12/26   Consultants:  None  Procedures:  As delineated as above  Antimicrobials:  Anti-infectives (From admission, onward)    Start     Dose/Rate Route Frequency Ordered Stop   01/07/24 1745  levofloxacin  (LEVAQUIN ) tablet 250 mg  Status:  Discontinued        250 mg Oral Once 01/07/24 1649 01/07/24 1651   01/07/24 1745  levofloxacin  (LEVAQUIN ) tablet 250 mg        250 mg Oral Daily 01/07/24 1651 01/09/24 0959   01/04/24 1800  levofloxacin  (LEVAQUIN ) tablet 250 mg        250 mg Oral Daily-1800 01/04/24 0838 01/06/24 1800   01/02/24 1500  Levofloxacin  (LEVAQUIN ) IVPB 250 mg  Status:  Discontinued        250 mg 50 mL/hr over 60 Minutes Intravenous Every 24 hours 01/02/24 1400 01/04/24 0837   01/02/24 0330  cefTRIAXone  (ROCEPHIN ) 2 g in sodium chloride  0.9 % 100 mL IVPB  Status:  Discontinued        2 g 200 mL/hr over 30 Minutes Intravenous Every 24 hours 01/02/24 0303 01/02/24 1357       Subjective: Seen and examined at bedside she is sitting in a chair and appears calm.  Walked with the nurse and did not desaturate.  Had no  complaints at this time.  No nausea or vomiting.  Denied any other concerns or complaints at this time.  Objective: Vitals:   01/07/24 0609 01/07/24 0919 01/07/24 0945 01/07/24 1359  BP: 136/81 (!) 140/67  137/73  Pulse: 92 93  93  Resp: 19 14  15   Temp: (!) 97.5 F (36.4 C)   97.8 F (36.6 C)  TempSrc: Oral   Oral  SpO2: 98% 97% 100% 96%  Weight:      Height:        Intake/Output Summary (Last 24 hours) at 01/07/2024 1655 Last data filed at 01/07/2024 1435 Gross per 24 hour  Intake 240 ml  Output --  Net 240 ml   Filed Weights   01/02/24 1821 01/05/24 0500 01/06/24 0500  Weight: 68 kg 61.6 kg 64.5 kg   Examination: Physical Exam:  Constitutional: Elderly Caucasian female who is chronically ill-appearing in no acute distress Respiratory: Diminished to auscultation bilaterally with some coarse breath sounds, no wheezing, rales, rhonchi or crackles. Normal respiratory effort and patient is not tachypenic. No accessory muscle use.  Unlabored breathing and not wearing any supplemental oxygen nasal cannula Cardiovascular:  RRR, no murmurs / rubs / gallops. S1 and S2 auscultated.  Normal extremity edema Abdomen: Soft, non-tender, non-distended. Bowel sounds positive.  GU: Deferred. Musculoskeletal: No clubbing / cyanosis of digits/nails. No joint deformity upper and lower extremities.  Skin: No rashes, lesions, ulcers on a limited skin. No induration; Warm and dry.  Neurologic: CN 2-12 grossly intact with no focal deficits. Romberg sign and cerebellar reflexes not assessed.  Psychiatric: She is awake and alert and appears calm  Data Reviewed: I have personally reviewed following labs and imaging studies  CBC: Recent Labs  Lab 01/01/24 0504 01/01/24 2355 01/02/24 1802 01/04/24 0431 01/07/24 1056  WBC 9.0 21.8* 24.2* 14.4* 11.3*  NEUTROABS  --  20.6* 22.9* 11.4* 8.9*  HGB 11.7* 12.1 10.9* 11.4* 12.1  HCT 36.3 39.1 34.7* 36.6 38.9  MCV 77.7* 79.6* 78.9* 79.9* 79.6*  PLT  369 429* 397 407* 477*   Basic Metabolic Panel: Recent Labs  Lab 01/01/24 0504 01/01/24 2355 01/07/24 1056  NA 139 138 137  K 4.3 4.4 4.3  CL 106 104 101  CO2 23 23 25   GLUCOSE 141* 183* 275*  BUN 9 15 23   CREATININE 0.66 0.61 0.60  CALCIUM  10.0 9.9 10.2  MG  --   --  1.8  PHOS  --   --  3.2   GFR: Estimated Creatinine Clearance: 61.8 mL/min (by C-G formula based on SCr of 0.6 mg/dL). Liver Function Tests: Recent Labs  Lab 01/07/24 1056  AST 18  ALT 32  ALKPHOS 168*  BILITOT 0.2  PROT 7.1  ALBUMIN 3.6   Recent Labs  Lab 01/01/24 2355  LIPASE 12   No results for input(s): AMMONIA in the last 168 hours. Coagulation Profile: No results for input(s): INR, PROTIME in the last 168 hours. Cardiac Enzymes: No results for input(s): CKTOTAL, CKMB, CKMBINDEX, TROPONINI in the last 168 hours. BNP (last 3 results) Recent Labs    01/01/24 2355  PROBNP 80.6   HbA1C: No results for input(s): HGBA1C in the last 72 hours. CBG: Recent Labs  Lab 01/06/24 1618 01/06/24 2101 01/07/24 0715 01/07/24 1106 01/07/24 1600  GLUCAP 271* 221* 197* 271* 257*   Lipid Profile: No results for input(s): CHOL, HDL, LDLCALC, TRIG, CHOLHDL, LDLDIRECT in the last 72 hours. Thyroid Function Tests: No results for input(s): TSH, T4TOTAL, FREET4, T3FREE, THYROIDAB in the last 72 hours. Anemia Panel: No results for input(s): VITAMINB12, FOLATE, FERRITIN, TIBC, IRON , RETICCTPCT in the last 72 hours. Sepsis Labs: Recent Labs  Lab 01/01/24 2359 01/02/24 0152 01/02/24 1630  PROCALCITON  --   --  <0.10  LATICACIDVEN 1.2 1.1  --    Recent Results (from the past 240 hours)  Urine Culture     Status: Abnormal   Collection Time: 12/28/23  7:41 PM   Specimen: Urine, Random  Result Value Ref Range Status   Specimen Description URINE, RANDOM  Final   Special Requests   Final    URINE, CLEAN CATCH Performed at [facility], [address], [place], [place] [address]    Culture >=100,000 COLONIES/mL CITROBACTER FREUNDII (A)  Final   Report Status 12/30/2023 FINAL  Final   Organism ID, Bacteria CITROBACTER FREUNDII (A)  Final      Susceptibility   Citrobacter freundii - MIC*    CEFEPIME <=0.12 SENSITIVE Sensitive     ERTAPENEM <=0.12 SENSITIVE Sensitive     CEFTRIAXONE  8 RESISTANT Resistant     CIPROFLOXACIN <=0.06 SENSITIVE Sensitive  GENTAMICIN <=1 SENSITIVE Sensitive     NITROFURANTOIN <=16 SENSITIVE Sensitive     TRIMETH /SULFA  <=20 SENSITIVE Sensitive     PIP/TAZO Value in next row Sensitive      <=4 SENSITIVEThis is a modified FDA-approved test that has been validated and its performance characteristics determined by the reporting laboratory.  This laboratory is certified under the Clinical Laboratory Improvement Amendments CLIA as qualified to perform high complexity clinical laboratory testing.    MEROPENEM Value in next row Sensitive      <=4 SENSITIVEThis is a modified FDA-approved test that has been validated and its performance characteristics determined by the reporting laboratory.  This laboratory is certified under the Clinical Laboratory Improvement Amendments CLIA as qualified to perform high complexity clinical laboratory testing.    * >=100,000 COLONIES/mL CITROBACTER FREUNDII  Culture, blood (routine x 2)     Status: None   Collection Time: 01/02/24 12:58 AM   Specimen: BLOOD  Result Value Ref Range Status   Specimen Description   Final    BLOOD BLOOD RIGHT ARM Performed at [facility], [address] [place], [place] [address]    Special Requests   Final    BOTTLES DRAWN AEROBIC AND ANAEROBIC Blood Culture results may not be optimal due to an inadequate volume of blood received in culture bottles Performed at [facility], [address] [place], [place] [address]    Culture   Final    NO GROWTH 5 DAYS Performed at [facility], [address] [place], [place] [address]    Report Status 01/07/2024 FINAL  Final  Respiratory (~20 pathogens) panel by PCR     Status: None   Collection Time: 01/02/24  2:59 AM   Specimen: Nasopharyngeal Swab; Respiratory  Result Value Ref Range Status   Adenovirus NOT DETECTED NOT DETECTED Final   Coronavirus 229E NOT DETECTED NOT DETECTED Final    Comment: (NOTE) The Coronavirus on the Respiratory Panel, DOES NOT test for the novel  Coronavirus (2019 nCoV)    Coronavirus HKU1 NOT DETECTED NOT DETECTED Final   Coronavirus NL63 NOT DETECTED NOT DETECTED Final   Coronavirus OC43 NOT DETECTED NOT DETECTED Final   Metapneumovirus NOT DETECTED NOT DETECTED Final   Rhinovirus / Enterovirus NOT DETECTED NOT DETECTED Final   Influenza A NOT DETECTED NOT DETECTED Final   Influenza B NOT DETECTED NOT DETECTED Final   Parainfluenza Virus 1 NOT DETECTED NOT DETECTED Final   Parainfluenza Virus 2 NOT DETECTED NOT DETECTED Final   Parainfluenza Virus 3 NOT DETECTED NOT DETECTED Final   Parainfluenza Virus 4 NOT DETECTED NOT DETECTED Final   Respiratory Syncytial Virus NOT DETECTED NOT DETECTED Final   Bordetella pertussis NOT DETECTED NOT DETECTED Final   Bordetella Parapertussis NOT DETECTED NOT DETECTED Final   Chlamydophila pneumoniae NOT DETECTED NOT DETECTED Final   Mycoplasma pneumoniae NOT DETECTED NOT DETECTED Final    Comment: Performed at [facility], [address] [place], [place] [address]  Culture, blood (routine x 2)     Status: None   Collection Time: 01/02/24  4:30 PM   Specimen: BLOOD RIGHT ARM  Result Value Ref Range Status   Specimen Description   Final    BLOOD RIGHT ARM Performed at [facility], [address], [place], [place] [address]    Special Requests   Final    BOTTLES DRAWN AEROBIC ONLY Blood Culture adequate volume Performed at [facility],  [address] [place], [place] [address]    Culture   Final    NO GROWTH 5  DAYS Performed at [facility], [address], [place], [place] [address]    Report Status 01/07/2024 FINAL  Final    Radiology Studies: DG CHEST PORT 1 VIEW Result Date: 01/07/2024 EXAM: 1 VIEW(S) XRAY OF THE CHEST 01/07/2024 08:28:27 AM COMPARISON: 12/28/2023 CLINICAL HISTORY: SOB (shortness of breath) FINDINGS: LUNGS AND PLEURA: Bibasilar streaky airspace opacities. No pleural effusion. No pneumothorax. HEART AND MEDIASTINUM: Aortic calcification. Cardiac loop recorder device noted. The cardiac and mediastinal silhouettes are otherwise without acute abnormality. BONES AND SOFT TISSUES: No acute osseous abnormality. IMPRESSION: 1. Bibasilar streaky airspace opacities, similar to prior and possibly representing atelectasis and/or infection. Electronically signed by: Michaeline Blanch MD 01/07/2024 12:39 PM EST RP Workstation: [id number]   Scheduled Meds:  arformoterol   15 mcg Nebulization BID   aspirin  EC  81 mg Oral Daily   atorvastatin   40 mg Oral Daily   budesonide  (PULMICORT ) nebulizer solution  0.25 mg Nebulization BID   feeding supplement (GLUCERNA SHAKE)  237 mL Oral BID BM   heparin   5,000 Units Subcutaneous Q8H   insulin  aspart  0-6 Units Subcutaneous TID WC   insulin  glargine  15 Units Subcutaneous Daily   levofloxacin   250 mg Oral Daily   lisinopril   20 mg Oral Daily   pantoprazole   40 mg Oral Daily   QUEtiapine   50 mg Oral QHS   Continuous Infusions:   LOS: 5 days   Alejandro Marker, DO [facility] Available via Epic secure chat 7am-7pm After these hours, please refer to coverage provider listed on [email or web address] 01/07/2024, 4:55 PM  "

## 2024-01-07 NOTE — Progress Notes (Signed)
 "                                                                                                                                                                                                          Daily Progress Note   Patient Name: Allison Hughes       Date: 01/07/2024 DOB: 1950-12-12  Age: 73 y.o. 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  Length of Stay: 5  Current Medications: Scheduled Meds:   arformoterol   15 mcg Nebulization BID   aspirin  EC  81 mg Oral Daily   atorvastatin   40 mg Oral Daily   budesonide  (PULMICORT ) nebulizer solution  0.25 mg Nebulization BID   feeding supplement (GLUCERNA SHAKE)  237 mL Oral BID BM   heparin   5,000 Units Subcutaneous Q8H   insulin  aspart  0-6 Units Subcutaneous TID WC   insulin  glargine  15 Units Subcutaneous Daily   lisinopril   20 mg Oral Daily   pantoprazole   40 mg Oral Daily   QUEtiapine   50 mg Oral QHS    Continuous Infusions:   PRN Meds: acetaminophen , acetaminophen , ALPRAZolam , ipratropium-albuterol , lip balm, ondansetron  (ZOFRAN ) IV  Physical Exam         Elderly lady resting in bed   alert and lyses but is still confused has some cognitive delay Appears with generalized weakness  Vital Signs: BP 137/73 (BP Location: Left Arm)   Pulse 93   Temp 97.8 F (36.6 C) (Oral)   Resp 15   Ht 5' 7 (1.702 m)   Wt 64.5 kg   SpO2 96%   BMI 22.27 kg/m  SpO2: SpO2: 96 % O2 Device: O2 Device: Room Air O2 Flow Rate: O2 Flow Rate (L/min): 2 L/min  Intake/output summary:  Intake/Output Summary (Last 24 hours) at 01/07/2024 1627 Last data filed at 01/07/2024 1435 Gross per 24 hour  Intake 240 ml  Output --  Net 240 ml    LBM: Last BM Date : 01/04/24 Baseline Weight: Weight: 68 kg Most recent weight: Weight: 64.5 kg       Palliative Assessment/Data:      Patient Active Problem List   Diagnosis Date Noted   Malnutrition of moderate degree 01/06/2024   Palliative care encounter 01/03/2024   Goals of care, counseling/discussion 01/03/2024  Counseling and coordination of care 01/03/2024   Acute respiratory failure with hypoxia (HCC) 01/02/2024   Acute cystitis with hematuria 12/29/2023   Generalized weakness 12/29/2023   Failure to thrive in adult 12/29/2023   Recurrent falls 12/29/2023   Chronic obstructive pulmonary disease (HCC) 12/29/2023   Acute metabolic encephalopathy 12/28/2023   Acute encephalopathy 11/29/2023   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   GERD (gastroesophageal reflux disease) 11/29/2023   Chronic heart failure with preserved ejection fraction (HFpEF) (HCC) 10/20/2023   Fall at home, initial encounter 10/20/2023   Hyperlipidemia 10/20/2023   History of gastric ulcer 10/20/2023   ABLA (acute blood loss anemia) 09/19/2023   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   Neurocognitive disorder 08/31/2023   Impaired mobility and ADLs 08/20/2023   Anemia 09/02/2022   Aphasia 06/03/2022   History of stroke 09/06/2020   DM (diabetes mellitus), type 2 (HCC) 11/20/2018   Hypokalemia 02/02/2016   Anxiety 02/02/2016   Essential hypertension 02/02/2016    Palliative Care Assessment & Plan   Patient Profile:    Assessment:  Patient is a 73 year old female with a past medical history of dementia, type 2 diabetes mellitus, hypertension, hyperlipidemia, prior CVA, COPD, HFpEF, aortic mural thrombus, history of GI bleed, and recurrent falls who was admitted on 01/01/2024 with hypoxia and altered mental status.  Patient had just been discharged from being admitted on 12/14-12/18/25 for management of altered mental status secondary to Citrobacter UTI and when patient presented to  SNF in the evening of 01/01/2024, they did not take her admission due to hypoxia and altered mental status.  Since hospitalization, patient receiving management for acute hypoxic respiratory failure and altered mental status.  Palliative medicine team consulted to assist with complex medical decision making.   Recommendations/Plan: Palliative care continues to follow for CODE STATUS and broad goals of care discussions.    Full Code.  SNF rehab with palliative on discharge.   Goals of Care and Additional Recommendations: Limitations on Scope of Treatment: Full Scope Treatment  Code Status:    Code Status Orders  (From admission, onward)           Start     Ordered   01/02/24 1840  Full code  (Code Status)  Continuous       Question:  By:  Answer:  Consent: discussion documented in EHR   01/02/24 1839           Code Status History     Date Active Date Inactive Code Status Order ID Comments User Context   12/28/2023 2201 01/01/2024 2307 Full Code [id number]  Lou Claretta HERO, MD ED   11/29/2023 1824 12/04/2023 1911 Full Code [id number]  Claudene Maximino LABOR, MD ED   10/20/2023 1616 11/11/2023 1825 Full Code [id number]  Claudene Maximino LABOR, MD Inpatient   09/19/2023 2320 09/24/2023 2131 Full Code [id number]  Dorinda Drue DASEN, MD ED   09/02/2022 1237 09/03/2022 1532 Full Code [id number]  Seena Marsa NOVAK, MD ED   06/03/2022 2225 06/07/2022 1827 Full Code [id number]  Shona Terry SAILOR, DO ED   09/06/2020 1647 09/08/2020 2226 Full Code [id number]  Lanetta Lingo, MD ED   02/02/2016 0134 02/05/2016 1734 Full Code [id number]  Jonel Lonni SQUIBB, MD Inpatient   05/11/2011 1937 05/12/2011 2105 Full Code [id number]  Rojelio Montie BIRCH, RN Inpatient       Prognosis:  Unable to determine  Discharge Planning: Skilled Nursing Facility for rehab with Palliative care service follow-up  Care plan was discussed with patient, IDT  Thank you for allowing the Palliative Medicine Team to assist in the care of  this patient. mod MDM.      Greater than 50%  of this time was spent counseling and coordinating care related to the above assessment and plan.  Lonia Serve, MD  Please contact Palliative Medicine Team phone at [phone] for questions and concerns.       "

## 2024-01-07 NOTE — TOC Progression Note (Addendum)
 Transition of Care [facility]) - Progression Note    Patient Details  Name: Allison Hughes MRN: [id number] Date of Birth: [birth year]  [facility] of [facility] CM/SW Contact  Alfonse JONELLE Rex, RN Phone Number: 01/07/2024, 12:21 PM  Clinical Narrative:   PT eval completed recommendation for short term rehab/SNF. FL2 updated. Call to [facility], accepted for short term rehab admit, bed not available until Friday 12/26 due to facility pharmacy closed. SNF auth initiated via [facility] with request for admit date 12/26, auth pending.   -12:28pm NCM called to pt's friend, Manus Cong, updated on anticipated return to [facility] on Friday 12/29 pending insurance shara Manus voiced understanding. No further questions/concerns at this time.     Expected Discharge Plan: Skilled Nursing Facility Barriers to Discharge: Continued Medical Work up               Expected Discharge Plan and Services   Discharge Planning Services: CM Consult   Living arrangements for the past 2 months: Single Family Home                 DME Arranged: N/A DME Agency: NA       HH Arranged: NA HH Agency: NA         Social Drivers of Health (SDOH) Interventions SDOH Screenings   Food Insecurity: No Food Insecurity (01/04/2024)  Housing: Low Risk (01/04/2024)  Transportation Needs: No Transportation Needs (01/04/2024)  Utilities: Not At Risk (01/04/2024)  Social Connections: Unknown (01/02/2024)  Tobacco Use: High Risk (01/02/2024)    Readmission Risk Interventions    01/04/2024    5:20 PM 10/24/2023    2:14 PM  Readmission Risk Prevention Plan  Transportation Screening Complete Complete  PCP or Specialist Appt within 3-5 Days  Complete  HRI or Home Care Consult  Complete  Social Work Consult for Recovery Care Planning/Counseling  Complete  Palliative Care Screening  Complete  Medication Review [employer]) Complete Referral to Pharmacy  PCP or Specialist appointment  within 3-5 days of discharge Complete   HRI or Home Care Consult Complete   SW Recovery Care/Counseling Consult Complete   Palliative Care Screening Not Applicable   Skilled Nursing Facility Complete

## 2024-01-07 NOTE — Progress Notes (Signed)
SATURATION QUALIFICATIONS: (This note is used to comply with regulatory documentation for home oxygen)  Patient Saturations on Room Air at Rest = 100%  Patient Saturations on Room Air while Ambulating = 99%  

## 2024-01-07 NOTE — Plan of Care (Signed)
   Problem: Education: Goal: Ability to describe self-care measures that may prevent or decrease complications (Diabetes Survival Skills Education) will improve Outcome: Progressing Goal: Individualized Educational Video(s) Outcome: Progressing   Problem: Fluid Volume: Goal: Ability to maintain a balanced intake and output will improve Outcome: Progressing

## 2024-01-07 NOTE — NC FL2 (Signed)
 " [facility]  MEDICAID FL2 LEVEL OF CARE FORM     IDENTIFICATION  Patient Name: Allison Hughes Birthdate: 11-05-1950 Sex: female Admission Date (Current Location): 01/01/2024  [place] Number:  Lloyd [id number] P Facility and Address:  [facility],  [address] [place], [place] [address]      Provider Number: [id number]  Attending Physician Name and Address:  Sherrill Alejandro Donovan, DO  Relative Name and Phone Number:  Adin Manus Benne, Emergency Contact  [phone] [facility])    Current Level of Care: Hospital Recommended Level of Care: Skilled Nursing Facility Prior Approval Number:    Date Approved/Denied:   PASRR Number: [id number]  Discharge Plan: SNF    Current Diagnoses: Patient Active Problem List   Diagnosis Date Noted   Malnutrition of moderate degree 01/06/2024   Palliative care encounter 01/03/2024   Goals of care, counseling/discussion 01/03/2024   Counseling and coordination of care 01/03/2024   Acute respiratory failure with hypoxia (HCC) 01/02/2024   Acute cystitis with hematuria 12/29/2023   Generalized weakness 12/29/2023   Failure to thrive in adult 12/29/2023   Recurrent falls 12/29/2023   Chronic obstructive pulmonary disease (HCC) 12/29/2023   Acute metabolic encephalopathy 12/28/2023   Acute encephalopathy 11/29/2023   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   GERD (gastroesophageal reflux disease) 11/29/2023   Chronic heart failure with preserved ejection fraction (HFpEF) (HCC) 10/20/2023   Fall at home, initial encounter 10/20/2023   Hyperlipidemia 10/20/2023   History of gastric ulcer 10/20/2023   ABLA (acute blood loss anemia) 09/19/2023   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   Neurocognitive disorder 08/31/2023   Impaired mobility and ADLs 08/20/2023   Anemia 09/02/2022   Aphasia 06/03/2022   History of stroke 09/06/2020   DM (diabetes mellitus), type 2 (HCC) 11/20/2018   Hypokalemia 02/02/2016    Anxiety 02/02/2016   Essential hypertension 02/02/2016    Orientation RESPIRATION BLADDER Height & Weight     Self, Place  Normal Continent, External catheter Weight: 64.5 kg Height:  5' 7 (170.2 cm)  BEHAVIORAL SYMPTOMS/MOOD NEUROLOGICAL BOWEL NUTRITION STATUS      Incontinent Diet (DYS 3)  AMBULATORY STATUS COMMUNICATION OF NEEDS Skin   Limited Assist Verbally Other (Comment) (Eccymosis bilateral arms and hands)                       Personal Care Assistance Level of Assistance  Bathing, Feeding, Dressing Bathing Assistance: Maximum assistance Feeding assistance: Limited assistance Dressing Assistance: Maximum assistance     Functional Limitations Info  Sight, Hearing, Speech Sight Info: Impaired Hearing Info: Impaired (hard of hearing) Speech Info: Adequate    SPECIAL CARE FACTORS FREQUENCY  PT (By licensed PT), OT (By licensed OT)     PT Frequency: 5x/wk OT Frequency: 5x/wk            Contractures Contractures Info: Not present    Additional Factors Info  Code Status, Allergies, Psychotropic Code Status Info: Full code Allergies Info: Iodine, Shrimp (Shellfish Allergy) Psychotropic Info: see MAR         Current Medications (01/07/2024):  This is the current hospital active medication list Current Facility-Administered Medications  Medication Dose Route Frequency Provider Last Rate Last Admin   acetaminophen  (TYLENOL ) suppository 650 mg  650 mg Rectal Q4H PRN Ghimire, Kuber, MD   650 mg at 01/02/24 0949   acetaminophen  (TYLENOL ) tablet 650 mg  650 mg Oral Q6H PRN Ghimire, Kuber, MD   650 mg at 01/03/24 1857  ALPRAZolam  (XANAX ) tablet 0.25 mg  0.25 mg Oral BID PRN Ghimire, Kuber, MD   0.25 mg at 01/06/24 1840   arformoterol  (BROVANA ) nebulizer solution 15 mcg  15 mcg Nebulization BID Stephens, Tiona K, MD   15 mcg at 01/07/24 0902   aspirin  EC tablet 81 mg  81 mg Oral Daily Stephens, Tiona K, MD   81 mg at 01/07/24 0859   atorvastatin  (LIPITOR)  tablet 40 mg  40 mg Oral Daily Stephens, Tiona K, MD   40 mg at 01/07/24 0859   budesonide  (PULMICORT ) nebulizer solution 0.25 mg  0.25 mg Nebulization BID Stephens, Tiona K, MD   0.25 mg at 01/07/24 0901   feeding supplement (GLUCERNA SHAKE) (GLUCERNA SHAKE) liquid 237 mL  237 mL Oral BID BM Raenelle Coria, MD   237 mL at 01/07/24 1218   heparin  injection 5,000 Units  5,000 Units Subcutaneous Q8H Ghimire, Kuber, MD   5,000 Units at 01/07/24 1218   insulin  aspart (novoLOG ) injection 0-6 Units  0-6 Units Subcutaneous TID WC Stephens, Tiona K, MD   3 Units at 01/07/24 1126   insulin  glargine (LANTUS ) injection 15 Units  15 Units Subcutaneous Daily Ghimire, Kuber, MD   15 Units at 01/07/24 0859   ipratropium-albuterol  (DUONEB) 0.5-2.5 (3) MG/3ML nebulizer solution 3 mL  3 mL Nebulization Q4H PRN Raenelle Coria, MD       lip balm (CARMEX) ointment   Topical PRN Raenelle Coria, MD       lisinopril  (ZESTRIL ) tablet 20 mg  20 mg Oral Daily Stephens, Tiona K, MD   20 mg at 01/07/24 9141   ondansetron  (ZOFRAN ) injection 4 mg  4 mg Intravenous Q6H PRN Ghimire, Kuber, MD   4 mg at 01/02/24 1721   pantoprazole  (PROTONIX ) EC tablet 40 mg  40 mg Oral Daily Ghimire, Kuber, MD   40 mg at 01/07/24 9140   QUEtiapine  (SEROQUEL ) tablet 50 mg  50 mg Oral QHS Ghimire, Kuber, MD   50 mg at 01/06/24 2159     Discharge Medications: Please see discharge summary for a list of discharge medications.  Relevant Imaging Results:  Relevant Lab Results:   Additional Information SSN [id number]  Alfonse JONELLE Rex, RN     "

## 2024-01-07 NOTE — Hospital Course (Addendum)
 The patient is a 73 year old with dementia, type 2 diabetes, hypertension, hyperlipidemia, previous stroke, COPD, heart failure with preserved ejection fraction, aortic mural thrombus, history of GI bleed and recurrent fall who was admitted to the hospital 12/14-12/18/25 with altered mental status apparently secondary to Citrobacter UTI.  She was thought to be encephalopathic from Xanax , Seroquel  also.  Patient was discharged to SNF later in the night, they did not take her admission because she was hypoxic and altered.  She was sent back to the hospital, brought to [facility].   On arrival to the emergency room, patient was arousable but unable to give any history.  Blood pressure stable.  Oxygen stable on 2 L oxygen supplementation.  WBC count 21.8 with normal WBC count in the morning before discharge. Further workup was done and Blood gas and electrolytes were fairly stable, troponins are normal, CT scan abdomen pelvis without any acute abnormalities and CT chest without any acute abnormality, known mural thrombus. Respiratory virus panel, RSV and flu negative and Blood glucose adequate. Subsequently was Admitted due to altered mentation, possible aspiration pneumonia, hypoxemia. She's improving and medically stable. PT/OT recommending SNF and cannot be accepted until Friday 12/26.   Assessment and Plan:  Acute Encephalopathy in a patient with known cognitive decline, recently hospitalized with Citrobacter Freundii UTI -Currently no focal deficits.  Mental status fluctuates and was a more confused today  -Respiratory  virus panel negative. -Brain MRI with chronic encephalomalacia.  No acute stroke.   -No evidence of CO2 narcosis but CXR 12/24 did show Bibasilar streaky airspace opacities, similar to prior and possibly representing atelectasis and/or infection this AM Probably her baseline. -For her Citrobacter she completed antibiotics and  she was treated with additional Levofloxacin  with the last  dose to be 12/25 (will have completed 7 days) -Remains FULL code. -Leukocytosis is improving:  Recent Labs  Lab 12/30/23 0452 12/31/23 0416 01/01/24 0504 01/01/24 2355 01/02/24 1802 01/04/24 0431 01/07/24 1056  WBC 6.4 6.0 9.0 21.8* 24.2* 14.4* 11.3*  -Keep on oxygen to keep saturation more than 90%. -Procalcitonin less than 0.1.  B12 507.  AM Cortisol 6.  TSH 0.5.   Acute Hypoxemic Respiratory Failure: Likely secondary to on and off sleepiness.  There was no evidence of pneumonia or CO2 retention.  Keep on oxygen to keep saturation more than 90%.  Ambulatory Home O2 Screen Done and does not require O2. Repeat CXR as above. Abx as above. C/w DuoNeb 3 mL Neb q4hprn Wheezing and SOB, Budesonide  0.25 mg Neb BID and Arfomoterol 15 mcg Neb BID. C/w D3 Diet w/ Thin Liquids    Agitation and Confusion in the setting of Delirium superimposed on early Dementia: Patient developed hospital-acquired delirium on top of her underlying cognitive decline and early dementia.  MRI showed encephalomalacia. -C/w Quetiapine  50 mg po at bedtime and Alprazolam  0.25 mg po BIDprn Anxiety as long-term medications to continue. -C/w Delirium Precautions and prevention and Fall Precautions.  Essential HTN: C/w Lisinopril  20 mg po Daily. CTM BP per Protocol. Last BP reading was 108/62   Diabetes Mellitus Type 2: C/w Very Sensitive Novolog  0-6 units AC and Insulin  Glargine 15 units sq Daily. CTM CBGs per Protocol. CBG Trend ranging from 157-259 on the last 7 checks.  Hx of CVA: C/w ASA 81 mg po Daily and Atorvastatin  40 mg po Daily   HLD: C/w Atorvastatin  40 mg po Daily  Thrombocytosis: Likely Reactive in the setting of above. Plt Count Trend went from 360 -> 334 ->  369 -> 429 -> 397 -> 407 -> 477 on the last check. CTM and Trend and repeat CBC intermittently   Moderate Malnutrition in the Context of Chronic Illness: Nutrition Status: Nutrition Problem: Moderate Malnutrition Etiology: chronic  illness Signs/Symptoms: mild fat depletion, moderate muscle depletion Interventions: Refer to RD note for recommendations, Magic cup, Glucerna shake  GERD/GI Prophylaxis: C/w Pantoprazole  40 mg po daily

## 2024-01-08 LAB — GLUCOSE, CAPILLARY
Glucose-Capillary: 157 mg/dL — ABNORMAL HIGH (ref 70–99)
Glucose-Capillary: 231 mg/dL — ABNORMAL HIGH (ref 70–99)
Glucose-Capillary: 220 mg/dL — ABNORMAL HIGH (ref 70–99)
Glucose-Capillary: 259 mg/dL — ABNORMAL HIGH (ref 70–99)

## 2024-01-08 NOTE — Progress Notes (Signed)
 " PROGRESS NOTE    Allison Hughes  FMW:[id number] DOB: 1950/07/29 DOA: 01/01/2024 PCP: Valma Carwin, MD   Brief Narrative:  The patient is a 73 year old with dementia, type 2 diabetes, hypertension, hyperlipidemia, previous stroke, COPD, heart failure with preserved ejection fraction, aortic mural thrombus, history of GI bleed and recurrent fall who was admitted to the hospital 12/14-12/18/25 with altered mental status apparently secondary to Citrobacter UTI.  She was thought to be encephalopathic from Xanax , Seroquel  also.  Patient was discharged to SNF later in the night, they did not take her admission because she was hypoxic and altered.  She was sent back to the hospital, brought to [facility].   On arrival to the emergency room, patient was arousable but unable to give any history.  Blood pressure stable.  Oxygen stable on 2 L oxygen supplementation.  WBC count 21.8 with normal WBC count in the morning before discharge. Further workup was done and Blood gas and electrolytes were fairly stable, troponins are normal, CT scan abdomen pelvis without any acute abnormalities and CT chest without any acute abnormality, known mural thrombus. Respiratory virus panel, RSV and flu negative and Blood glucose adequate. Subsequently was Admitted due to altered mentation, possible aspiration pneumonia, hypoxemia. She's improving and PT/OT recommending SNF and cannot be accepted until Friday 12/26.   Assessment and Plan:  Acute Encephalopathy in a patient with known cognitive decline, recently hospitalized with Citrobacter Freundii UTI -Currently no focal deficits.  Mental status fluctuates and was a more confused today  -Respiratory  virus panel negative. -Brain MRI with chronic encephalomalacia.  No acute stroke.   -No evidence of CO2 narcosis but CXR 12/24 did show Bibasilar streaky airspace opacities, similar to prior and possibly representing atelectasis and/or infection this AM Probably her  baseline. -For her Citrobacter she completed antibiotics and  she was treated with additional Levofloxacin  with the last dose to be 12/25 (will have gotten 7 days) -Remains full code. -Leukocytosis is improving:  Recent Labs  Lab 12/30/23 0452 12/31/23 0416 01/01/24 0504 01/01/24 2355 01/02/24 1802 01/04/24 0431 01/07/24 1056  WBC 6.4 6.0 9.0 21.8* 24.2* 14.4* 11.3*  -Keep on oxygen to keep saturation more than 90%. -Procalcitonin less than 0.1.  B12 507.  AM Cortisol 6.  TSH 0.5.   Acute Hypoxemic Respiratory Failure: Likely secondary to on and off sleepiness.  There was no evidence of pneumonia or CO2 retention.  Keep on oxygen to keep saturation more than 90%.  Ambulatory Home O2 Screen Done and does not require O2. Repeat CXR as above. Abx as above. C/w DuoNeb 3 mL Neb q4hprn Wheezing and SOB, Budesonide  0.25 mg Neb BID and Arfomoterol 15 mcg Neb BID. C/w D3 Diet w/ Thin Liquids    Agitation and Confusion in the setting of Delirium superimposed on early Dementia: Patient developed hospital-acquired delirium on top of her underlying cognitive decline and early dementia.  MRI showed encephalomalacia. -C/w Quetiapine  50 mg po at bedtime and Alprazolam  0.25 mg po BIDprn Anxiety as long-term medications to continue. -C/w Delirium Precautions and prevention and Fall Precautions. -Husband at bedside   Essential HTN: C/w Lisinopril  20 mg po Daily. CTM BP per Protocol. Last BP reading was 108/62   Diabetes Mellitus Type 2: C/w Very Sensitive Novolog  0-6 units AC and Insulin  Glargine 15 units sq Daily. CTM CBGs per Protocol. CBG Trend ranging from 157-271 on the last 7 checks:  Hx of CVA: C/w ASA 81 mg po Daily and Atorvastatin  40 mg po Daily  HLD: C/w Atorvastatin  40 mg po Daily  Thrombocytosis: Likely Reactive in the setting of above. Plt Count Trend went from 360 -> 334 -> 369 -> 429 -> 397 -> 407 -> 477 on the last check. CTM and Trend and repeat CBC intermittently   Moderate  Malnutrition in the Context of Chronic Illness: Nutrition Status: Nutrition Problem: Moderate Malnutrition Etiology: chronic illness Signs/Symptoms: mild fat depletion, moderate muscle depletion Interventions: Refer to RD note for recommendations, Magic cup, Glucerna shake  GERD/GI Prophylaxis: C/w Pantoprazole  40 mg po daily   DVT prophylaxis: heparin  injection 5,000 Units Start: 01/02/24 1400    Code Status: Full Code Family Communication: D/w Husband @ bedside  Disposition Plan:  Level of care: Progressive Status is: Inpatient Remains inpatient appropriate because: Medically stable to D/C to SNF but awaiting Bed Availability and will not be available until 12/26   Consultants:  None  Procedures:  As delineated as above  Antimicrobials:  Anti-infectives (From admission, onward)    Start     Dose/Rate Route Frequency Ordered Stop   01/07/24 1745  levofloxacin  (LEVAQUIN ) tablet 250 mg  Status:  Discontinued        250 mg Oral Once 01/07/24 1649 01/07/24 1651   01/07/24 1745  levofloxacin  (LEVAQUIN ) tablet 250 mg        250 mg Oral Daily 01/07/24 1651 01/08/24 0930   01/04/24 1800  levofloxacin  (LEVAQUIN ) tablet 250 mg        250 mg Oral Daily-1800 01/04/24 0838 01/06/24 1800   01/02/24 1500  Levofloxacin  (LEVAQUIN ) IVPB 250 mg  Status:  Discontinued        250 mg 50 mL/hr over 60 Minutes Intravenous Every 24 hours 01/02/24 1400 01/04/24 0837   01/02/24 0330  cefTRIAXone  (ROCEPHIN ) 2 g in sodium chloride  0.9 % 100 mL IVPB  Status:  Discontinued        2 g 200 mL/hr over 30 Minutes Intravenous Every 24 hours 01/02/24 0303 01/02/24 1357       Subjective: And examined at bedside she is little bit more confused today but appeared in no acute distress.  Husband thinks she is a little bit stronger.  No nausea or vomiting.  No pain.  No other concerns or complaints at this time and awaiting SNF placement  Objective: Vitals:   01/08/24 0500 01/08/24 0619 01/08/24 0848 01/08/24  0851  BP:  108/62    Pulse:  81    Resp:  16    Temp:  98.3 F (36.8 C)    TempSrc:  Oral    SpO2:  90% 95% 95%  Weight: 64.8 kg     Height:        Intake/Output Summary (Last 24 hours) at 01/08/2024 1327 Last data filed at 01/08/2024 1300 Gross per 24 hour  Intake 720 ml  Output --  Net 720 ml   Filed Weights   01/05/24 0500 01/06/24 0500 01/08/24 0500  Weight: 61.6 kg 64.5 kg 64.8 kg   Examination: Physical Exam:  Constitutional: Alert Caucasian female chronically ill-appearing in no acute distress Respiratory: Diminished to auscultation bilaterally, no wheezing, rales, rhonchi or crackles. Normal respiratory effort and patient is not tachypenic. No accessory muscle use.  Unlabored breathing and not wearing any supplemental oxygen nasal cannula Cardiovascular: RRR, no murmurs / rubs / gallops. S1 and S2 auscultated. No extremity edema.  Abdomen: Soft, non-tender, non-distended. Bowel sounds positive.  GU: Deferred. Musculoskeletal: No clubbing / cyanosis of digits/nails. No joint deformity upper and lower extremities.  Skin: No rashes, lesions, ulcers limited skin evaluation. No induration; Warm and dry.  Neurologic: CN 2-12 grossly intact with no focal deficits. Romberg sign and cerebellar reflexes not assessed.  Psychiatric: Confused and pleasantly demented but appears calm  Data Reviewed: I have personally reviewed following labs and imaging studies  CBC: Recent Labs  Lab 01/01/24 2355 01/02/24 1802 01/04/24 0431 01/07/24 1056  WBC 21.8* 24.2* 14.4* 11.3*  NEUTROABS 20.6* 22.9* 11.4* 8.9*  HGB 12.1 10.9* 11.4* 12.1  HCT 39.1 34.7* 36.6 38.9  MCV 79.6* 78.9* 79.9* 79.6*  PLT 429* 397 407* 477*   Basic Metabolic Panel: Recent Labs  Lab 01/01/24 2355 01/07/24 1056  NA 138 137  K 4.4 4.3  CL 104 101  CO2 23 25  GLUCOSE 183* 275*  BUN 15 23  CREATININE 0.61 0.60  CALCIUM  9.9 10.2  MG  --  1.8  PHOS  --  3.2   GFR: Estimated Creatinine Clearance:  61.8 mL/min (by C-G formula based on SCr of 0.6 mg/dL). Liver Function Tests: Recent Labs  Lab 01/07/24 1056  AST 18  ALT 32  ALKPHOS 168*  BILITOT 0.2  PROT 7.1  ALBUMIN 3.6   Recent Labs  Lab 01/01/24 2355  LIPASE 12   No results for input(s): AMMONIA in the last 168 hours. Coagulation Profile: No results for input(s): INR, PROTIME in the last 168 hours. Cardiac Enzymes: No results for input(s): CKTOTAL, CKMB, CKMBINDEX, TROPONINI in the last 168 hours. BNP (last 3 results) Recent Labs    01/01/24 2355  PROBNP 80.6   HbA1C: No results for input(s): HGBA1C in the last 72 hours. CBG: Recent Labs  Lab 01/07/24 1106 01/07/24 1600 01/07/24 2058 01/08/24 0715 01/08/24 1149  GLUCAP 271* 257* 171* 157* 231*   Lipid Profile: No results for input(s): CHOL, HDL, LDLCALC, TRIG, CHOLHDL, LDLDIRECT in the last 72 hours. Thyroid Function Tests: No results for input(s): TSH, T4TOTAL, FREET4, T3FREE, THYROIDAB in the last 72 hours. Anemia Panel: No results for input(s): VITAMINB12, FOLATE, FERRITIN, TIBC, IRON , RETICCTPCT in the last 72 hours. Sepsis Labs: Recent Labs  Lab 01/01/24 2359 01/02/24 0152 01/02/24 1630  PROCALCITON  --   --  <0.10  LATICACIDVEN 1.2 1.1  --    Recent Results (from the past 240 hours)  Culture, blood (routine x 2)     Status: None   Collection Time: 01/02/24 12:58 AM   Specimen: BLOOD  Result Value Ref Range Status   Specimen Description   Final    BLOOD BLOOD RIGHT ARM Performed at [facility], [address], [place], [place] [address]    Special Requests   Final    BOTTLES DRAWN AEROBIC AND ANAEROBIC Blood Culture results may not be optimal due to an inadequate volume of blood received in culture bottles Performed at [facility], [address], [place], [place] [address]    Culture   Final    NO GROWTH 5 DAYS Performed at [facility], [address], [place], [place] [address]    Report Status 01/07/2024 FINAL  Final  Respiratory (~20 pathogens) panel by PCR     Status: None   Collection Time: 01/02/24  2:59 AM   Specimen: Nasopharyngeal Swab; Respiratory  Result Value Ref Range Status   Adenovirus NOT DETECTED NOT DETECTED Final   Coronavirus 229E NOT DETECTED NOT DETECTED Final    Comment: (NOTE) The Coronavirus on the Respiratory Panel, DOES NOT test for the novel  Coronavirus (2019 nCoV)    Coronavirus HKU1 NOT DETECTED NOT DETECTED Final   Coronavirus NL63 NOT DETECTED NOT DETECTED Final   Coronavirus OC43 NOT DETECTED NOT DETECTED Final   Metapneumovirus NOT DETECTED NOT DETECTED Final   Rhinovirus / Enterovirus NOT DETECTED NOT DETECTED Final   Influenza A NOT DETECTED NOT DETECTED Final   Influenza B NOT DETECTED NOT DETECTED Final   Parainfluenza Virus 1 NOT DETECTED NOT DETECTED Final   Parainfluenza Virus 2 NOT DETECTED NOT DETECTED Final   Parainfluenza Virus 3 NOT DETECTED NOT DETECTED Final   Parainfluenza Virus 4 NOT DETECTED NOT DETECTED Final   Respiratory Syncytial Virus NOT DETECTED NOT DETECTED Final   Bordetella pertussis NOT DETECTED NOT DETECTED Final   Bordetella Parapertussis NOT DETECTED NOT DETECTED Final   Chlamydophila pneumoniae NOT DETECTED NOT DETECTED Final   Mycoplasma pneumoniae NOT DETECTED NOT DETECTED Final    Comment: Performed at [facility], [address], [place], [place] [address]  Culture, blood (routine x 2)     Status: None   Collection Time: 01/02/24  4:30 PM   Specimen: BLOOD RIGHT ARM  Result Value Ref Range Status   Specimen Description   Final    BLOOD RIGHT ARM Performed at [facility], [address] [place], [place] [address]    Special Requests   Final    BOTTLES DRAWN AEROBIC ONLY Blood Culture adequate volume Performed at [facility], [address] [place], [place] [address]    Culture   Final     NO GROWTH 5 DAYS Performed at [facility], [address], [place], [place] [address]    Report Status 01/07/2024 FINAL  Final    Radiology Studies: DG CHEST PORT 1 VIEW Result Date: 01/07/2024 EXAM: 1 VIEW(S) XRAY OF THE CHEST 01/07/2024 08:28:27 AM COMPARISON: 12/28/2023 CLINICAL HISTORY: SOB (shortness of breath) FINDINGS: LUNGS AND PLEURA: Bibasilar streaky airspace opacities. No pleural effusion. No pneumothorax. HEART AND MEDIASTINUM: Aortic calcification. Cardiac loop recorder device noted. The cardiac and mediastinal silhouettes are otherwise without acute abnormality. BONES AND SOFT TISSUES: No acute osseous abnormality. IMPRESSION: 1. Bibasilar streaky airspace opacities, similar to prior and possibly representing atelectasis and/or infection. Electronically signed by: Michaeline Blanch MD 01/07/2024 12:39 PM EST RP Workstation: [id number]   Scheduled Meds:  arformoterol   15 mcg Nebulization BID   aspirin  EC  81 mg Oral Daily   atorvastatin   40 mg Oral Daily   budesonide  (PULMICORT ) nebulizer solution  0.25 mg Nebulization BID   feeding supplement (GLUCERNA SHAKE)  237 mL Oral BID BM   heparin   5,000 Units Subcutaneous Q8H   insulin  aspart  0-6 Units Subcutaneous TID WC   insulin  glargine  15 Units Subcutaneous Daily   lisinopril   20 mg Oral Daily   pantoprazole   40 mg Oral Daily   QUEtiapine   50 mg Oral QHS   Continuous Infusions:   LOS: 6 days   Alejandro Marker, DO [facility] Available via Epic secure chat 7am-7pm After these hours, please refer to coverage provider listed on [email or web address] 01/08/2024, 1:27 PM  "

## 2024-01-09 LAB — GLUCOSE, CAPILLARY
Glucose-Capillary: 168 mg/dL — ABNORMAL HIGH (ref 70–99)
Glucose-Capillary: 319 mg/dL — ABNORMAL HIGH (ref 70–99)

## 2024-01-09 MED ORDER — TRELEGY ELLIPTA 100-62.5-25 MCG/ACT IN AEPB: 1.0000 | INHALATION_SPRAY | Freq: Every day | RESPIRATORY_TRACT | Status: AC

## 2024-01-09 MED ORDER — QUETIAPINE FUMARATE 50 MG PO TABS: 50.0000 mg | ORAL_TABLET | Freq: Every day | ORAL | Status: AC

## 2024-01-09 MED ORDER — ACETAMINOPHEN 325 MG PO TABS: 650.0000 mg | ORAL_TABLET | Freq: Four times a day (QID) | ORAL | Status: AC | PRN

## 2024-01-09 MED ORDER — ALPRAZOLAM 0.25 MG PO TABS: 0.2500 mg | ORAL_TABLET | Freq: Two times a day (BID) | ORAL | 0 refills | Status: AC | PRN

## 2024-01-09 MED ORDER — IPRATROPIUM-ALBUTEROL 0.5-2.5 (3) MG/3ML IN SOLN: 3.0000 mL | RESPIRATORY_TRACT | Status: AC | PRN

## 2024-01-09 MED ORDER — CARMEX CLASSIC LIP BALM EX OINT: TOPICAL_OINTMENT | CUTANEOUS | Status: AC | PRN

## 2024-01-09 NOTE — Progress Notes (Signed)
 Speech Language Pathology Treatment: Dysphagia  Patient Details Name: Allison Hughes MRN: [id number] DOB: 1950/09/20 Today's Date: 01/09/2024 Time: 8642-8593 SLP Time Calculation (min) (ACUTE ONLY): 9 min  Assessment / Plan / Recommendation Clinical Impression  Pt was seen for potential diet advancement prior to discharge to SNF. Pt accepted trials of thin liquids but did not eat solids offered. She reports that swallowing is going well but that she feels confused sometimes and not as strong as she used to be. No overt signs of aspiration or dysphagia noted with thin liquids.   PLAN: Continue Dys 3 (mechanical soft) diet and thin liquids upon discharge. Any further SLP needs can be addressed at next level of care.    HPI HPI: Allison Hughes is a 73 y.o. female with medical history significant of  Dm type 2, HLD, HTN, CVA, COPD and HFpEF aortic mural thrombus, mild cognitive impairment, GI bleed and recurrent falls who presents to the ER with complaint of shortness of breath. SLP consulted for bedside swallow assessment.      SLP Plan  All goals met        Swallow Evaluation Recommendations   Recommendations: PO diet PO Diet Recommendation: Dysphagia 3 (Mechanical soft);Thin liquids (Level 0) Liquid Administration via: Cup;Straw Medication Administration: Whole meds with liquid Supervision: Patient able to self-feed;Intermittent supervision/cueing for swallowing strategies Postural changes: Position pt fully upright for meals Oral care recommendations: Oral care BID (2x/day)     Recommendations                           Dysphagia, unspecified (R13.10)     All goals met     Leita SAILOR., M.A. CCC-SLP Acute Rehabilitation Services Office: [phone]  Secure chat preferred   01/09/2024, 2:09 PM

## 2024-01-09 NOTE — Progress Notes (Signed)
 "                                                                                                                                                                                                          Daily Progress Note   Patient Name: Allison Hughes       Date: 01/09/2024 DOB: 19-Mar-1950  Age: 73 y.o. MRN#: [id number] Attending Physician: Sherrill Alejandro Donovan, DO Primary Care Physician: Valma Carwin, MD Admit Date: 01/01/2024  Reason for Consultation/Follow-up: Establishing goals of care  Subjective: Awaiting discharge, to [facility] of Stay: 7  Current Medications: Scheduled Meds:   arformoterol   15 mcg Nebulization BID   aspirin  EC  81 mg Oral Daily   atorvastatin   40 mg Oral Daily   budesonide  (PULMICORT ) nebulizer solution  0.25 mg Nebulization BID   feeding supplement (GLUCERNA SHAKE)  237 mL Oral BID BM   heparin   5,000 Units Subcutaneous Q8H   insulin  aspart  0-6 Units Subcutaneous TID WC   insulin  glargine  15 Units Subcutaneous Daily   lisinopril   20 mg Oral Daily   pantoprazole   40 mg Oral Daily   QUEtiapine   50 mg Oral QHS    Continuous Infusions:   PRN Meds: acetaminophen , acetaminophen , ALPRAZolam , ipratropium-albuterol , lip balm, ondansetron  (ZOFRAN ) IV  Physical Exam         Elderly lady resting in bed     Appears with generalized weakness  Vital Signs: BP 111/66 (BP Location: Right Arm)   Pulse 76   Temp 97.8 F (36.6 C)   Resp 19   Ht 5' 7 (1.702 m)   Wt 64 kg   SpO2 94%   BMI 22.10 kg/m  SpO2: SpO2: 94 % O2 Device: O2 Device: Room Air O2 Flow Rate: O2 Flow Rate (L/min): 2 L/min  Intake/output summary:  Intake/Output Summary (Last 24 hours) at 01/09/2024 1500 Last data filed at 01/09/2024 9390 Gross per 24 hour  Intake 240 ml  Output 500 ml  Net -260 ml   LBM: Last BM Date : 01/04/24 Baseline Weight: Weight: 68 kg Most recent weight: Weight: 64 kg       Palliative Assessment/Data:      Patient Active Problem  List   Diagnosis Date Noted   Malnutrition of moderate degree 01/06/2024   Palliative care encounter 01/03/2024   Goals of care, counseling/discussion 01/03/2024   Counseling and coordination of care 01/03/2024   Acute respiratory failure with hypoxia (HCC) 01/02/2024   Acute cystitis with hematuria 12/29/2023   Generalized weakness 12/29/2023  Failure to thrive in adult 12/29/2023   Recurrent falls 12/29/2023   Chronic obstructive pulmonary disease (HCC) 12/29/2023   Acute metabolic encephalopathy 12/28/2023   Acute encephalopathy 11/29/2023   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   GERD (gastroesophageal reflux disease) 11/29/2023   Chronic heart failure with preserved ejection fraction (HFpEF) (HCC) 10/20/2023   Fall at home, initial encounter 10/20/2023   Hyperlipidemia 10/20/2023   History of gastric ulcer 10/20/2023   ABLA (acute blood loss anemia) 09/19/2023   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   Neurocognitive disorder 08/31/2023   Impaired mobility and ADLs 08/20/2023   Anemia 09/02/2022   Aphasia 06/03/2022   History of stroke 09/06/2020   DM (diabetes mellitus), type 2 (HCC) 11/20/2018   Hypokalemia 02/02/2016   Anxiety 02/02/2016   Essential hypertension 02/02/2016    Palliative Care Assessment & Plan   Patient Profile:    Assessment:  Patient is a 73 year old female with a past medical history of dementia, type 2 diabetes mellitus, hypertension, hyperlipidemia, prior CVA, COPD, HFpEF, aortic mural thrombus, history of GI bleed, and recurrent falls who was admitted on 01/01/2024 with hypoxia and altered mental status.  Patient had just been discharged from being admitted on 12/14-12/18/25 for management of altered mental status secondary to Citrobacter UTI and when patient presented to SNF in the evening of 01/01/2024, they did not take her admission due to hypoxia and altered mental status.  Since hospitalization, patient receiving management for acute  hypoxic respiratory failure and altered mental status.  Palliative medicine team consulted to assist with complex medical decision making.   Recommendations/Plan: Discharge Summary and TOC note reviewed.  Full Code.  SNF rehab with palliative on discharge.   Goals of Care and Additional Recommendations: Limitations on Scope of Treatment: Full Scope Treatment  Code Status:    Code Status Orders  (From admission, onward)           Start     Ordered   01/02/24 1840  Full code  (Code Status)  Continuous       Question:  By:  Answer:  Consent: discussion documented in EHR   01/02/24 1839           Code Status History     Date Active Date Inactive Code Status Order ID Comments User Context   12/28/2023 2201 01/01/2024 2307 Full Code [id number]  Lou Claretta HERO, MD ED   11/29/2023 1824 12/04/2023 1911 Full Code [id number]  Claudene Maximino LABOR, MD ED   10/20/2023 1616 11/11/2023 1825 Full Code [id number]  Claudene Maximino LABOR, MD Inpatient   09/19/2023 2320 09/24/2023 2131 Full Code [id number]  Dorinda Drue DASEN, MD ED   09/02/2022 1237 09/03/2022 1532 Full Code [id number]  Seena Marsa NOVAK, MD ED   06/03/2022 2225 06/07/2022 1827 Full Code [id number]  Shona Terry SAILOR, DO ED   09/06/2020 1647 09/08/2020 2226 Full Code [id number]  Lanetta Lingo, MD ED   02/02/2016 0134 02/05/2016 1734 Full Code [id number]  Jonel Lonni SQUIBB, MD Inpatient   05/11/2011 1937 05/12/2011 2105 Full Code [id number]  Rojelio Montie BIRCH, RN Inpatient       Prognosis:  Unable to determine  Discharge Planning: Skilled Nursing Facility for rehab with Palliative care service follow-up  Care plan was discussed with  IDT  Thank you for allowing the Palliative Medicine Team to assist in the care of this patient. low MDM.      Greater than 50%  of this time was spent counseling and coordinating care related to  the above assessment and plan.  Lonia Serve, MD  Please contact Palliative Medicine Team phone at [phone]  for questions and concerns.       "

## 2024-01-09 NOTE — Progress Notes (Signed)
 1311-Report called to [facility] [employer]. All questions answered. Pt and friend at bedside made aware that transport had been called.  1500-[facility] arrived to take pt to [facility].

## 2024-01-09 NOTE — TOC Transition Note (Signed)
 Transition of Care [facility]) - Discharge Note   Patient Details  Name: Allison Hughes MRN: [id number] Date of Birth: [birth year]  [facility] CM/SW Contact:  Tawni CHRISTELLA Eva, LCSW Phone Number: 01/09/2024, 12:10 PM   Clinical Narrative:     [name] insurance shara was approved for SNF placement at [facility]. Pt to d/c to [facility], RN to call report to [phone]. [facility] called, ICM sign off.   Final next level of care: Skilled Nursing Facility Barriers to Discharge: Barriers Resolved   Patient Goals and CMS Choice Patient states their goals for this hospitalization and ongoing recovery are:: SN to get stronger CMS Medicare.gov Compare Post Acute Care list provided to:: Patient Choice offered to / list presented to : Patient      Discharge Placement              Patient chooses bed at: [facility] Patient to be transferred to facility by: EMS   Patient and family notified of of transfer: 01/09/24  Discharge Plan and Services Additional resources added to the After Visit Summary for     Discharge Planning Services: CM Consult            DME Arranged: N/A DME Agency: NA       HH Arranged: NA HH Agency: NA        Social Drivers of Health (SDOH) Interventions SDOH Screenings   Food Insecurity: No Food Insecurity (01/04/2024)  Housing: Low Risk (01/04/2024)  Transportation Needs: No Transportation Needs (01/04/2024)  Utilities: Not At Risk (01/04/2024)  Social Connections: Unknown (01/02/2024)  Tobacco Use: High Risk (01/02/2024)     Readmission Risk Interventions    01/04/2024    5:20 PM 10/24/2023    2:14 PM  Readmission Risk Prevention Plan  Transportation Screening Complete Complete  PCP or Specialist Appt within 3-5 Days  Complete  HRI or Home Care Consult  Complete  Social Work Consult for Recovery Care Planning/Counseling  Complete  Palliative Care Screening  Complete  Medication Review Furniture Conservator/restorer) Complete Referral to Pharmacy  PCP or Specialist appointment within 3-5 days of discharge Complete   HRI or Home Care Consult Complete   SW Recovery Care/Counseling Consult Complete   Palliative Care Screening Not Applicable   Skilled Nursing Facility Complete

## 2024-01-09 NOTE — TOC Progression Note (Signed)
 Transition of Care [facility]) - Progression Note    Patient Details  Name: Allison Hughes MRN: [id number] Date of Birth: 10-09-1950  Transition of [facility]) CM/SW Contact  Lorraine LILLETTE Fenton, LCSW Phone Number: 01/09/2024, 9:14 AM  Clinical Narrative:    9:00 AM CSW contacted [employer] to check Auth- clinicals received.  Pt request still pending status, ICM following.   Expected Discharge Plan: Skilled Nursing Facility Barriers to Discharge: Continued Medical Work up               Expected Discharge Plan and Services   Discharge Planning Services: CM Consult   Living arrangements for the past 2 months: Single Family Home Expected Discharge Date: 01/09/24               DME Arranged: N/A DME Agency: NA       HH Arranged: NA HH Agency: NA         Social Drivers of Health (SDOH) Interventions SDOH Screenings   Food Insecurity: No Food Insecurity (01/04/2024)  Housing: Low Risk (01/04/2024)  Transportation Needs: No Transportation Needs (01/04/2024)  Utilities: Not At Risk (01/04/2024)  Social Connections: Unknown (01/02/2024)  Tobacco Use: High Risk (01/02/2024)    Readmission Risk Interventions    01/04/2024    5:20 PM 10/24/2023    2:14 PM  Readmission Risk Prevention Plan  Transportation Screening Complete Complete  PCP or Specialist Appt within 3-5 Days  Complete  HRI or Home Care Consult  Complete  Social Work Consult for Recovery Care Planning/Counseling  Complete  Palliative Care Screening  Complete  Medication Review Oceanographer) Complete Referral to Pharmacy  PCP or Specialist appointment within 3-5 days of discharge Complete   HRI or Home Care Consult Complete   SW Recovery Care/Counseling Consult Complete   Palliative Care Screening Not Applicable   Skilled Nursing Facility Complete

## 2024-01-09 NOTE — Discharge Summary (Signed)
 " Physician Discharge Summary   Patient: Allison Hughes MRN: [id number] DOB: 1950-08-09  Admit date:     01/01/2024  Discharge date: 01/09/2024  Discharge Physician: Alejandro Marker, DO   PCP: Valma Carwin, MD   Recommendations at discharge:   F/U w/ PCP within 1-2 weeks and repeat CBC, CMP, Mag, Phos w/in 1 week Follow up with Palliative Care in the outpatient setting Follow up with Neurology in the outpatient setting within 1-2 weeks  Discharge Diagnoses: Principal Problem:   Acute respiratory failure with hypoxia (HCC) Active Problems:   Acute encephalopathy   Essential hypertension   DM (diabetes mellitus), type 2 (HCC)   Palliative care encounter   Goals of care, counseling/discussion   Counseling and coordination of care   Malnutrition of moderate degree  Resolved Problems:   * No resolved hospital problems. [facility] Course: The patient is a 73 year old with dementia, type 2 diabetes, hypertension, hyperlipidemia, previous stroke, COPD, heart failure with preserved ejection fraction, aortic mural thrombus, history of GI bleed and recurrent fall who was admitted to the hospital 12/14-12/18/25 with altered mental status apparently secondary to Citrobacter UTI.  She was thought to be encephalopathic from Xanax , Seroquel  also.  Patient was discharged to SNF later in the night, they did not take her admission because she was hypoxic and altered.  She was sent back to the hospital, brought to [facility].   On arrival to the emergency room, patient was arousable but unable to give any history.  Blood pressure stable.  Oxygen stable on 2 L oxygen supplementation.  WBC count 21.8 with normal WBC count in the morning before discharge. Further workup was done and Blood gas and electrolytes were fairly stable, troponins are normal, CT scan abdomen pelvis without any acute abnormalities and CT chest without any acute abnormality, known mural thrombus. Respiratory virus panel, RSV and flu  negative and Blood glucose adequate. Subsequently was Admitted due to altered mentation, possible aspiration pneumonia, hypoxemia. She's improving and medically stable. PT/OT recommending SNF and cannot be accepted until Friday 12/26.   Assessment and Plan:  Acute Encephalopathy in a patient with known cognitive decline, recently hospitalized with Citrobacter Freundii UTI -Currently no focal deficits.  Mental status fluctuates and was a more confused today  -Respiratory  virus panel negative. -Brain MRI with chronic encephalomalacia.  No acute stroke.   -No evidence of CO2 narcosis but CXR 12/24 did show Bibasilar streaky airspace opacities, similar to prior and possibly representing atelectasis and/or infection this AM Probably her baseline. -For her Citrobacter she completed antibiotics and  she was treated with additional Levofloxacin  with the last dose to be 12/25 (will have completed 7 days) -Remains FULL code. -Leukocytosis is improving:  Recent Labs  Lab 12/30/23 0452 12/31/23 0416 01/01/24 0504 01/01/24 2355 01/02/24 1802 01/04/24 0431 01/07/24 1056  WBC 6.4 6.0 9.0 21.8* 24.2* 14.4* 11.3*  -Keep on oxygen to keep saturation more than 90%. -Procalcitonin less than 0.1.  B12 507.  AM Cortisol 6.  TSH 0.5.   Acute Hypoxemic Respiratory Failure: Likely secondary to on and off sleepiness.  There was no evidence of pneumonia or CO2 retention.  Keep on oxygen to keep saturation more than 90%.  Ambulatory Home O2 Screen Done and does not require O2. Repeat CXR as above. Abx as above. C/w DuoNeb 3 mL Neb q4hprn Wheezing and SOB, Budesonide  0.25 mg Neb BID and Arfomoterol 15 mcg Neb BID. C/w D3 Diet w/ Thin Liquids    Agitation and  Confusion in the setting of Delirium superimposed on early Dementia: Patient developed hospital-acquired delirium on top of her underlying cognitive decline and early dementia.  MRI showed encephalomalacia. -C/w Quetiapine  50 mg po at bedtime and Alprazolam   0.25 mg po BIDprn Anxiety as long-term medications to continue. -C/w Delirium Precautions and prevention and Fall Precautions.  Essential HTN: C/w Lisinopril  20 mg po Daily. CTM BP per Protocol. Last BP reading was 108/62   Diabetes Mellitus Type 2: C/w Very Sensitive Novolog  0-6 units AC and Insulin  Glargine 15 units sq Daily. CTM CBGs per Protocol. CBG Trend ranging from 157-259 on the last 7 checks.  Hx of CVA: C/w ASA 81 mg po Daily and Atorvastatin  40 mg po Daily   HLD: C/w Atorvastatin  40 mg po Daily  Thrombocytosis: Likely Reactive in the setting of above. Plt Count Trend went from 360 -> 334 -> 369 -> 429 -> 397 -> 407 -> 477 on the last check. CTM and Trend and repeat CBC intermittently   Moderate Malnutrition in the Context of Chronic Illness: Nutrition Status: Nutrition Problem: Moderate Malnutrition Etiology: chronic illness Signs/Symptoms: mild fat depletion, moderate muscle depletion Interventions: Refer to RD note for recommendations, Magic cup, Glucerna shake  GERD/GI Prophylaxis: C/w Pantoprazole  40 mg po daily   Nutrition Documentation    Flowsheet Row ED to Hosp-Admission (Current) from 01/01/2024 in [place] 4TH FLOOR PROGRESSIVE CARE AND UROLOGY  Nutrition Problem Moderate Malnutrition  Etiology chronic illness  Nutrition Goal Patient will meet greater than or equal to 90% of their needs  Interventions Refer to RD note for recommendations, Magic cup, Glucerna shake   Consultants: Palliative Care Procedures performed: As delineated as above   Disposition: Skilled nursing facility  Diet recommendation:  Dysphagia type 3 Thin Liquid  DISCHARGE MEDICATION: Allergies as of 01/09/2024       Reactions   Iodine Swelling   Not allergic to IV contrast [facility]   Shrimp [shellfish Allergy] Swelling   Fresh shrimp        Medication List     STOP taking these medications    sulfamethoxazole -trimethoprim  800-160 MG tablet Commonly known as: BACTRIM   DS       TAKE these medications    acetaminophen  325 MG tablet Commonly known as: TYLENOL  Take 2 tablets (650 mg total) by mouth every 6 (six) hours as needed for mild pain (pain score 1-3), fever or headache. What changed:  medication strength how much to take when to take this reasons to take this   albuterol  108 (90 Base) MCG/ACT inhaler Commonly known as: VENTOLIN  HFA Inhale 2 puffs into the lungs every 6 (six) hours as needed for wheezing or shortness of breath.   ALPRAZolam  0.25 MG tablet Commonly known as: XANAX  Take 1 tablet (0.25 mg total) by mouth 2 (two) times daily as needed for anxiety. What changed:  medication strength how much to take when to take this reasons to take this   aspirin  EC 81 MG tablet Take 1 tablet (81 mg total) by mouth daily. Swallow whole.   atorvastatin  40 MG tablet Commonly known as: LIPITOR Take 40 mg by mouth daily.   ipratropium-albuterol  0.5-2.5 (3) MG/3ML Soln Commonly known as: DUONEB Take 3 mLs by nebulization every 4 (four) hours as needed.   lip balm ointment Apply topically as needed.   lisinopril  20 MG tablet Commonly known as: ZESTRIL  Take 20 mg by mouth daily.   Melatonin 3 MG Caps Take 1 capsule (3 mg total) by mouth at  bedtime.   metFORMIN  500 MG tablet Commonly known as: GLUCOPHAGE  Take 500 mg by mouth at bedtime.   pantoprazole  40 MG tablet Commonly known as: PROTONIX  Take 40 mg by mouth daily.   QUEtiapine  50 MG tablet Commonly known as: SEROQUEL  Take 1 tablet (50 mg total) by mouth at bedtime. What changed:  medication strength how much to take when to take this additional instructions   Trelegy Ellipta  100-62.5-25 MCG/ACT Aepb Generic drug: Fluticasone -Umeclidin-Vilant Inhale 1 puff into the lungs daily at 12 noon.   Tresiba  100 UNIT/ML Soln Generic drug: Insulin  Degludec Inject 15 Units into the skin at bedtime.        Contact information for after-discharge care     Destination      [facility] .   Service: Skilled Nursing Contact information: [address] [place]  [address] [phone]                    Discharge Exam: Fredricka Weights   01/06/24 0500 01/08/24 0500 01/09/24 0641  Weight: 64.5 kg 64.8 kg 64 kg   Vitals:   01/09/24 0522 01/09/24 0641  BP:    Pulse:    Resp:    Temp:    SpO2: 90% 94%   Examination: Physical Exam:  Constitutional: Chronically ill appearing Caucasian female in NAD Respiratory: Diminished to auscultation bilaterally, no wheezing, rales, rhonchi or crackles. Normal respiratory effort and patient is not tachypenic. No accessory muscle use. Unlabored breathing  Cardiovascular: RRR, no murmurs / rubs / gallops. S1 and S2 auscultated. No extremity edema.  Abdomen: Soft, non-tender, non-distended. Bowel sounds positive.  GU: Deferred. Musculoskeletal: No clubbing / cyanosis of digits/nails. No joint deformity upper and lower extremities.  Skin: No rashes, lesions, ulcers on a limited skin evaluation. No induration; Warm and dry.  Neurologic: CN 2-12 grossly intact with no focal deficits. Romberg sign and cerebellar reflexes not assessed.  Psychiatric: Pleasantly confused and demented female who appears calm   Condition at discharge: stable  The results of significant diagnostics from this hospitalization (including imaging, microbiology, ancillary and laboratory) are listed below for reference.   Imaging Studies: DG CHEST PORT 1 VIEW Result Date: 01/07/2024 EXAM: 1 VIEW(S) XRAY OF THE CHEST 01/07/2024 08:28:27 AM COMPARISON: 12/28/2023 CLINICAL HISTORY: SOB (shortness of breath) FINDINGS: LUNGS AND PLEURA: Bibasilar streaky airspace opacities. No pleural effusion. No pneumothorax. HEART AND MEDIASTINUM: Aortic calcification. Cardiac loop recorder device noted. The cardiac and mediastinal silhouettes are otherwise without acute abnormality. BONES AND SOFT TISSUES: No acute osseous abnormality.  IMPRESSION: 1. Bibasilar streaky airspace opacities, similar to prior and possibly representing atelectasis and/or infection. Electronically signed by: Michaeline Blanch MD 01/07/2024 12:39 PM EST RP Workstation: [id number]   MR BRAIN WO CONTRAST Result Date: 01/02/2024 EXAM: MRI BRAIN WITHOUT CONTRAST 01/02/2024 12:37:35 PM TECHNIQUE: Multiplanar multisequence MRI of the head/brain was performed without the administration of intravenous contrast. COMPARISON: MRI of the head dated 10/20/2023. CLINICAL HISTORY: Mental status change, unknown cause. FINDINGS: BRAIN AND VENTRICLES: No acute infarct. No intracranial hemorrhage. No mass. No midline shift. No hydrocephalus. Age-related atrophy. Moderately advanced diffuse cerebral white matter disease. Focal encephalomalacia changes within the right parietal lobe and within the left inferior cerebellar hemisphere. The sella is unremarkable. Normal flow voids. ORBITS: No acute abnormality. SINUSES AND MASTOIDS: No acute abnormality. BONES AND SOFT TISSUES: Normal marrow signal. No acute soft tissue abnormality. IMPRESSION: 1. No acute intracranial abnormality. 2. Age-related atrophy and moderately advanced diffuse cerebral white matter disease. 3.  Focal encephalomalacia changes within the right parietal lobe and within the left inferior cerebellar hemisphere. Electronically signed by: Evalene Coho MD 01/02/2024 12:42 PM EST RP Workstation: [id number]   CT ABDOMEN PELVIS W CONTRAST Result Date: 01/02/2024 EXAM: CT ABDOMEN AND PELVIS WITH CONTRAST 01/02/2024 01:18:15 AM TECHNIQUE: CT of the abdomen and pelvis was performed with the administration of 100 mL of iohexol  (OMNIPAQUE ) 350 MG/ML injection. Multiplanar reformatted images are provided for review. Automated exposure control, iterative reconstruction, and/or weight-based adjustment of the mA/kV was utilized to reduce the radiation dose to as low as reasonably achievable. COMPARISON: None available. CLINICAL  HISTORY: Bowel obstruction suspected. FINDINGS: LOWER CHEST: No acute abnormality. LIVER: The liver is unremarkable. GALLBLADDER AND BILE DUCTS: Cholelithiasis is noted without evidence of complicating factors. No biliary ductal dilatation. SPLEEN: No acute abnormality. PANCREAS: No acute abnormality. ADRENAL GLANDS: Left adrenal lesion is noted to be stable over multiple previous exams, most consistent with adenoma. KIDNEYS, URETERS AND BLADDER: Kidneys show no focal mass or obstructive changes. No calculi are noted. No perinephric or periureteral stranding. The bladder is partially distended. GI AND BOWEL: Stomach demonstrates no acute abnormality. The bowel is unremarkable. No obstructive or inflammatory changes of the colon are seen. Mild diverticular changes noted. The appendix is not well visualized. No inflammatory changes to suggest appendicitis are noted. There is no bowel obstruction. PERITONEUM AND RETROPERITONEUM: No free fluid is seen. No free air. VASCULATURE: Aorta is normal in caliber. Aortic calcifications are seen without aneurysmal dilatation. LYMPH NODES: No lymphadenopathy. REPRODUCTIVE ORGANS: The uterus is within normal limits. BONES AND SOFT TISSUES: The bony structures are within normal limits with the exception of bilateral L5 pars defects. No focal soft tissue abnormality. IMPRESSION: 1. Diverticulosis without diverticulitis. 2. Stable left adrenal adenoma. 3. Cholelithiasis without complicating factors. Electronically signed by: Oneil Devonshire MD 01/02/2024 01:30 AM EST RP Workstation: [id number]   CT Angio Chest PE W and/or Wo Contrast Result Date: 01/02/2024 EXAM: CTA CHEST 01/02/2024 01:18:15 AM TECHNIQUE: CTA of the chest was performed after the administration of intravenous contrast. 100 ml omnipaque  315 . Multiplanar reformatted images are provided for review. MIP images are provided for review. Automated exposure control, iterative reconstruction, and/or weight based adjustment of  the mA/kV was utilized to reduce the radiation dose to as low as reasonably achievable. COMPARISON: None available. CLINICAL HISTORY: Decreased oxygen saturation FINDINGS: PULMONARY ARTERIES: The pulmonary artery shows a normal branching pattern without evidence of intraluminal filling defect to suggest pulmonary embolism. Main pulmonary artery is normal in caliber. MEDIASTINUM: The heart is not significantly enlarged in size. The pericardium demonstrates no acute abnormality. The thoracic aorta shows atherosclerotic calcifications. No aneurysmal dilatation is seen. Mural thrombus is noted within the descending thoracic aorta. No dissection is seen. The thoracic inlet is within normal limits. No hilar or mediastinal edema is noted. The esophagus, as visualized, is within normal limits. LYMPH NODES: No mediastinal, hilar or axillary lymphadenopathy. LUNGS AND PLEURA: The lungs demonstrate bibasilar atelectatic changes. Mild emphysematous changes are seen as well. No focal infiltrate or effusion is noted. No parenchymal nodules are seen. No pneumothorax. UPPER ABDOMEN: Limited images of the upper abdomen are unremarkable. SOFT TISSUES AND BONES: The bony structures show degenerative change of the thoracic spine. No acute soft tissue abnormality. IMPRESSION: 1. No evidence of pulmonary embolism. 2. Mural thrombus within the descending thoracic aorta, without dissection. Electronically signed by: Oneil Devonshire MD 01/02/2024 01:27 AM EST RP Workstation: [id number]   CT Cervical Spine Wo Contrast Result Date:  12/28/2023 EXAM: CT CERVICAL SPINE WITHOUT CONTRAST 12/28/2023 07:16:52 PM TECHNIQUE: CT of the cervical spine was performed without the administration of intravenous contrast. Multiplanar reformatted images are provided for review. Automated exposure control, iterative reconstruction, and/or weight based adjustment of the mA/kV was utilized to reduce the radiation dose to as low as reasonably achievable.  COMPARISON: None available. CLINICAL HISTORY: Neck trauma (Age >= 65y) FINDINGS: BONES AND ALIGNMENT: No acute fracture or traumatic malalignment. DEGENERATIVE CHANGES: No significant degenerative changes. SOFT TISSUES: No prevertebral soft tissue swelling. IMPRESSION: 1. No significant abnormality Electronically signed by: Pinkie Pebbles MD 12/28/2023 07:27 PM EST RP Workstation: [id number]   CT Head Wo Contrast Result Date: 12/28/2023 EXAM: CT HEAD WITHOUT 12/28/2023 07:16:52 PM TECHNIQUE: CT of the head was performed without the administration of intravenous contrast. Automated exposure control, iterative reconstruction, and/or weight based adjustment of the mA/kV was utilized to reduce the radiation dose to as low as reasonably achievable. COMPARISON: 11/29/2023 CLINICAL HISTORY: Head trauma, minor (Age >= 65y) FINDINGS: BRAIN AND VENTRICLES: No acute intracranial hemorrhage. No mass effect or midline shift. No extra-axial fluid collection. Generalized cerebral atrophy and advanced chronic small vessel ischemic disease. 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 Bilateral basal ganglia mineralization. Intracranial arterial calcification. No hydrocephalus. ORBITS: No acute abnormality. SINUSES AND MASTOIDS: No acute abnormality. SOFT TISSUES AND SKULL: No acute skull fracture. No acute soft tissue abnormality. IMPRESSION: 1. No acute intracranial abnormality. 2. Old right parietal and left cerebellar infarcts. Electronically signed by: Pinkie Pebbles MD 12/28/2023 07:25 PM EST RP Workstation: [id number]   DG Chest Port 1 View Result Date: 12/28/2023 CLINICAL DATA:  Fall.  Generalized weakness. EXAM: PORTABLE CHEST 1 VIEW COMPARISON:  11/29/2023. FINDINGS: The heart size and mediastinal contours are within normal limits. There is atherosclerotic calcification of the aorta. The pulmonary vasculature is distended. Perihilar interstitial prominence is noted bilaterally. There is  mild airspace disease at the lung bases. A loop recorder device is seen over the left chest. No consolidation, effusion, or pneumothorax. No acute osseous abnormality. IMPRESSION: 1. Mildly distended pulmonary vasculature with interstitial prominence bilaterally, possible edema or infiltrate. 2. Mild airspace disease at the lung bases, possible atelectasis or infiltrate. Electronically Signed   By: Leita Birmingham M.D.   On: 12/28/2023 19:10   CUP PACEART REMOTE DEVICE CHECK Result Date: 12/23/2023 ILR summary report received. Battery status OK. Normal device function. No new symptom, tachy, brady, or pause episodes. No new AF episodes. Monthly summary reports and ROV/PRN.  No histogram data, acceptable HR graph LA, CVRS  Microbiology: Results for orders placed or performed during the hospital encounter of 01/01/24  Culture, blood (routine x 2)     Status: None   Collection Time: 01/02/24 12:58 AM   Specimen: BLOOD  Result Value Ref Range Status   Specimen Description   Final    BLOOD BLOOD RIGHT ARM Performed at [facility], [address], [place], [place] [address]    Special Requests   Final    BOTTLES DRAWN AEROBIC AND ANAEROBIC Blood Culture results may not be optimal due to an inadequate volume of blood received in culture bottles Performed at [facility], [address], [place], [place] [address]    Culture   Final    NO GROWTH 5 DAYS Performed at [facility], [address] [place], [place] [address]    Report Status 01/07/2024 FINAL  Final  Respiratory (~20 pathogens) panel by PCR  Status: None   Collection Time: 01/02/24  2:59 AM   Specimen: Nasopharyngeal Swab; Respiratory  Result Value Ref Range Status   Adenovirus NOT DETECTED NOT DETECTED Final   Coronavirus 229E NOT DETECTED NOT DETECTED Final    Comment: (NOTE) The Coronavirus on the Respiratory Panel, DOES NOT test for the novel  Coronavirus (2019 nCoV)     Coronavirus HKU1 NOT DETECTED NOT DETECTED Final   Coronavirus NL63 NOT DETECTED NOT DETECTED Final   Coronavirus OC43 NOT DETECTED NOT DETECTED Final   Metapneumovirus NOT DETECTED NOT DETECTED Final   Rhinovirus / Enterovirus NOT DETECTED NOT DETECTED Final   Influenza A NOT DETECTED NOT DETECTED Final   Influenza B NOT DETECTED NOT DETECTED Final   Parainfluenza Virus 1 NOT DETECTED NOT DETECTED Final   Parainfluenza Virus 2 NOT DETECTED NOT DETECTED Final   Parainfluenza Virus 3 NOT DETECTED NOT DETECTED Final   Parainfluenza Virus 4 NOT DETECTED NOT DETECTED Final   Respiratory Syncytial Virus NOT DETECTED NOT DETECTED Final   Bordetella pertussis NOT DETECTED NOT DETECTED Final   Bordetella Parapertussis NOT DETECTED NOT DETECTED Final   Chlamydophila pneumoniae NOT DETECTED NOT DETECTED Final   Mycoplasma pneumoniae NOT DETECTED NOT DETECTED Final    Comment: Performed at [facility], [address] [place], [place] [address]  Culture, blood (routine x 2)     Status: None   Collection Time: 01/02/24  4:30 PM   Specimen: BLOOD RIGHT ARM  Result Value Ref Range Status   Specimen Description   Final    BLOOD RIGHT ARM Performed at [facility], [address] [place], [place] [address]    Special Requests   Final    BOTTLES DRAWN AEROBIC ONLY Blood Culture adequate volume Performed at [facility], [address] [place], [place] [address]    Culture   Final    NO GROWTH 5 DAYS Performed at [facility], [address], [place], [place] [address]    Report Status 01/07/2024 FINAL  Final   Labs: CBC: Recent Labs  Lab 01/02/24 1802 01/04/24 0431 01/07/24 1056  WBC 24.2* 14.4* 11.3*  NEUTROABS 22.9* 11.4* 8.9*  HGB 10.9* 11.4* 12.1  HCT 34.7* 36.6 38.9  MCV 78.9* 79.9* 79.6*  PLT 397 407* 477*   Basic Metabolic Panel: Recent Labs  Lab 01/07/24 1056  NA 137  K 4.3  CL 101  CO2 25  GLUCOSE 275*  BUN 23   CREATININE 0.60  CALCIUM  10.2  MG 1.8  PHOS 3.2   Liver Function Tests: Recent Labs  Lab 01/07/24 1056  AST 18  ALT 32  ALKPHOS 168*  BILITOT 0.2  PROT 7.1  ALBUMIN 3.6   CBG: Recent Labs  Lab 01/08/24 0715 01/08/24 1149 01/08/24 1607 01/08/24 2032 01/09/24 0804  GLUCAP 157* 231* 220* 259* 168*   Discharge time spent: greater than 30 minutes.  Signed: Alejandro Marker, DO [facility] 01/09/2024 "

## 2024-01-09 NOTE — Progress Notes (Signed)
 Report called to Olu at [facility]. All questions answered. AVS placed in discharge packet.

## 2024-01-20 ENCOUNTER — Encounter

## 2024-01-21 ENCOUNTER — Other Ambulatory Visit: Payer: Self-pay

## 2024-01-21 DIAGNOSIS — I7410 Embolism and thrombosis of unspecified parts of aorta: Secondary | ICD-10-CM

## 2024-01-22 ENCOUNTER — Encounter

## 2024-02-20 ENCOUNTER — Encounter

## 2024-02-22 ENCOUNTER — Encounter

## 2024-02-23 ENCOUNTER — Encounter

## 2024-03-10 ENCOUNTER — Ambulatory Visit: Admitting: Vascular Surgery

## 2024-03-22 ENCOUNTER — Encounter

## 2024-03-24 ENCOUNTER — Ambulatory Visit

## 2024-04-22 ENCOUNTER — Encounter

## 2024-04-24 ENCOUNTER — Ambulatory Visit

## 2024-05-25 ENCOUNTER — Ambulatory Visit

## 2024-06-25 ENCOUNTER — Ambulatory Visit

## 2024-07-26 ENCOUNTER — Ambulatory Visit

## 2024-08-26 ENCOUNTER — Ambulatory Visit

## 2024-09-26 ENCOUNTER — Ambulatory Visit

## 2024-10-27 ENCOUNTER — Ambulatory Visit

## 2024-11-27 ENCOUNTER — Ambulatory Visit

## 2024-12-28 ENCOUNTER — Ambulatory Visit

## 2025-01-28 ENCOUNTER — Ambulatory Visit
